# Patient Record
Sex: Female | Born: 1935
Health system: Southern US, Community
[De-identification: ages and names within clinical notes are randomized; demographics above are authoritative.]

## PROBLEM LIST (undated history)

## (undated) DIAGNOSIS — I351 Nonrheumatic aortic (valve) insufficiency: Secondary | ICD-10-CM

## (undated) DIAGNOSIS — I4891 Unspecified atrial fibrillation: Secondary | ICD-10-CM

## (undated) DIAGNOSIS — M1990 Unspecified osteoarthritis, unspecified site: Secondary | ICD-10-CM

## (undated) DIAGNOSIS — I428 Other cardiomyopathies: Secondary | ICD-10-CM

## (undated) DIAGNOSIS — E039 Hypothyroidism, unspecified: Secondary | ICD-10-CM

## (undated) DIAGNOSIS — I491 Atrial premature depolarization: Secondary | ICD-10-CM

## (undated) DIAGNOSIS — I2510 Atherosclerotic heart disease of native coronary artery without angina pectoris: Secondary | ICD-10-CM

## (undated) DIAGNOSIS — I214 Non-ST elevation (NSTEMI) myocardial infarction: Secondary | ICD-10-CM

## (undated) DIAGNOSIS — I420 Dilated cardiomyopathy: Secondary | ICD-10-CM

## (undated) DIAGNOSIS — E049 Nontoxic goiter, unspecified: Secondary | ICD-10-CM

## (undated) DIAGNOSIS — D62 Acute posthemorrhagic anemia: Secondary | ICD-10-CM

## (undated) HISTORY — DX: Nonrheumatic aortic (valve) insufficiency: I35.1

## (undated) HISTORY — PX: CARDIAC CATHETERIZATION: SHX172

## (undated) HISTORY — DX: Other cardiomyopathies: I42.8

## (undated) HISTORY — DX: Atrial premature depolarization: I49.1

## (undated) HISTORY — DX: Acute posthemorrhagic anemia: D62

## (undated) HISTORY — DX: Dilated cardiomyopathy: I42.0

## (undated) HISTORY — DX: Hypothyroidism, unspecified: E03.9

## (undated) HISTORY — PX: VAGINAL HYSTERECTOMY: SUR661

---

## 1997-04-02 DIAGNOSIS — I428 Other cardiomyopathies: Secondary | ICD-10-CM

## 1997-04-02 DIAGNOSIS — I429 Cardiomyopathy, unspecified: Secondary | ICD-10-CM

## 1997-04-02 HISTORY — DX: Other cardiomyopathies: I42.8

## 1997-04-02 HISTORY — DX: Cardiomyopathy, unspecified: I42.9

## 1997-08-05 ENCOUNTER — Inpatient Hospital Stay (HOSPITAL_COMMUNITY): Admission: EM | Admit: 1997-08-05 | Discharge: 1997-08-09 | Payer: Self-pay | Admitting: Emergency Medicine

## 1998-10-03 ENCOUNTER — Ambulatory Visit (HOSPITAL_COMMUNITY): Admission: RE | Admit: 1998-10-03 | Discharge: 1998-10-03 | Payer: Self-pay | Admitting: Internal Medicine

## 1999-11-01 ENCOUNTER — Ambulatory Visit (HOSPITAL_COMMUNITY): Admission: RE | Admit: 1999-11-01 | Discharge: 1999-11-01 | Payer: Self-pay | Admitting: Internal Medicine

## 1999-11-01 ENCOUNTER — Encounter: Payer: Self-pay | Admitting: Internal Medicine

## 1999-12-25 ENCOUNTER — Other Ambulatory Visit: Admission: RE | Admit: 1999-12-25 | Discharge: 1999-12-25 | Payer: Self-pay | Admitting: Internal Medicine

## 2001-04-29 ENCOUNTER — Emergency Department (HOSPITAL_COMMUNITY): Admission: EM | Admit: 2001-04-29 | Discharge: 2001-04-29 | Payer: Self-pay | Admitting: *Deleted

## 2001-11-15 ENCOUNTER — Emergency Department (HOSPITAL_COMMUNITY): Admission: EM | Admit: 2001-11-15 | Discharge: 2001-11-15 | Payer: Self-pay | Admitting: Emergency Medicine

## 2001-11-15 ENCOUNTER — Encounter: Payer: Self-pay | Admitting: Emergency Medicine

## 2004-10-26 ENCOUNTER — Encounter: Admission: RE | Admit: 2004-10-26 | Discharge: 2004-10-26 | Payer: Self-pay | Admitting: Internal Medicine

## 2004-11-15 ENCOUNTER — Encounter (INDEPENDENT_AMBULATORY_CARE_PROVIDER_SITE_OTHER): Payer: Self-pay | Admitting: Specialist

## 2004-11-15 ENCOUNTER — Encounter: Admission: RE | Admit: 2004-11-15 | Discharge: 2004-11-15 | Payer: Self-pay | Admitting: Internal Medicine

## 2004-11-15 ENCOUNTER — Other Ambulatory Visit: Admission: RE | Admit: 2004-11-15 | Discharge: 2004-11-15 | Payer: Self-pay | Admitting: Interventional Radiology

## 2006-03-11 ENCOUNTER — Emergency Department (HOSPITAL_COMMUNITY): Admission: EM | Admit: 2006-03-11 | Discharge: 2006-03-11 | Payer: Self-pay | Admitting: Emergency Medicine

## 2006-04-02 HISTORY — PX: THYROIDECTOMY: SHX17

## 2007-01-23 ENCOUNTER — Encounter: Admission: RE | Admit: 2007-01-23 | Discharge: 2007-01-23 | Payer: Self-pay | Admitting: Internal Medicine

## 2008-04-02 HISTORY — PX: OTHER SURGICAL HISTORY: SHX169

## 2008-12-22 ENCOUNTER — Encounter (HOSPITAL_COMMUNITY): Admission: RE | Admit: 2008-12-22 | Discharge: 2009-03-04 | Payer: Self-pay | Admitting: Neurosurgery

## 2009-01-18 ENCOUNTER — Encounter (INDEPENDENT_AMBULATORY_CARE_PROVIDER_SITE_OTHER): Payer: Self-pay | Admitting: Interventional Radiology

## 2009-01-18 ENCOUNTER — Ambulatory Visit (HOSPITAL_COMMUNITY): Admission: RE | Admit: 2009-01-18 | Discharge: 2009-01-18 | Payer: Self-pay | Admitting: Neurosurgery

## 2009-04-25 ENCOUNTER — Ambulatory Visit (HOSPITAL_COMMUNITY): Admission: RE | Admit: 2009-04-25 | Discharge: 2009-04-25 | Payer: Self-pay | Admitting: Internal Medicine

## 2010-04-02 DIAGNOSIS — R079 Chest pain, unspecified: Secondary | ICD-10-CM | POA: Insufficient documentation

## 2010-04-12 ENCOUNTER — Ambulatory Visit: Payer: Self-pay | Admitting: Cardiovascular Disease

## 2010-04-13 ENCOUNTER — Encounter: Payer: Self-pay | Admitting: Cardiology

## 2010-04-13 ENCOUNTER — Ambulatory Visit: Admission: RE | Admit: 2010-04-13 | Discharge: 2010-04-13 | Payer: Self-pay | Source: Home / Self Care

## 2010-04-13 ENCOUNTER — Ambulatory Visit (HOSPITAL_COMMUNITY)
Admission: RE | Admit: 2010-04-13 | Discharge: 2010-04-13 | Payer: Self-pay | Source: Home / Self Care | Attending: Cardiovascular Disease | Admitting: Cardiovascular Disease

## 2010-04-13 ENCOUNTER — Encounter: Payer: Self-pay | Admitting: Cardiovascular Disease

## 2010-05-16 ENCOUNTER — Ambulatory Visit: Payer: Self-pay | Admitting: Cardiovascular Disease

## 2010-05-16 ENCOUNTER — Ambulatory Visit (INDEPENDENT_AMBULATORY_CARE_PROVIDER_SITE_OTHER): Payer: Medicare Other | Admitting: *Deleted

## 2010-05-16 DIAGNOSIS — I359 Nonrheumatic aortic valve disorder, unspecified: Secondary | ICD-10-CM

## 2010-05-16 DIAGNOSIS — R079 Chest pain, unspecified: Secondary | ICD-10-CM

## 2010-05-17 ENCOUNTER — Other Ambulatory Visit (HOSPITAL_COMMUNITY): Payer: Self-pay | Admitting: *Deleted

## 2010-05-17 DIAGNOSIS — Z1231 Encounter for screening mammogram for malignant neoplasm of breast: Secondary | ICD-10-CM

## 2010-05-22 ENCOUNTER — Encounter (HOSPITAL_COMMUNITY): Payer: Self-pay

## 2010-05-22 ENCOUNTER — Ambulatory Visit (HOSPITAL_COMMUNITY)
Admission: RE | Admit: 2010-05-22 | Discharge: 2010-05-22 | Disposition: A | Discharge status: Home / Self Care | Payer: Medicare Other | Source: Ambulatory Visit | Attending: *Deleted | Admitting: *Deleted

## 2010-05-22 DIAGNOSIS — Z1231 Encounter for screening mammogram for malignant neoplasm of breast: Secondary | ICD-10-CM | POA: Insufficient documentation

## 2010-07-06 LAB — CBC
Hemoglobin: 12.1 g/dL (ref 12.0–15.0)
MCHC: 34.8 g/dL (ref 30.0–36.0)
RDW: 13.8 % (ref 11.5–15.5)
WBC: 5 10*3/uL (ref 4.0–10.5)

## 2010-07-06 LAB — PROTIME-INR: INR: 1.06 (ref 0.00–1.49)

## 2010-12-05 ENCOUNTER — Encounter: Payer: Self-pay | Admitting: Cardiology

## 2010-12-15 ENCOUNTER — Ambulatory Visit (INDEPENDENT_AMBULATORY_CARE_PROVIDER_SITE_OTHER): Payer: Medicare Other | Admitting: Cardiology

## 2010-12-15 ENCOUNTER — Encounter: Payer: Self-pay | Admitting: Cardiology

## 2010-12-15 VITALS — BP 122/70 | HR 68 | Ht 64.0 in | Wt 138.4 lb

## 2010-12-15 DIAGNOSIS — I351 Nonrheumatic aortic (valve) insufficiency: Secondary | ICD-10-CM | POA: Insufficient documentation

## 2010-12-15 DIAGNOSIS — I420 Dilated cardiomyopathy: Secondary | ICD-10-CM | POA: Insufficient documentation

## 2010-12-15 DIAGNOSIS — I491 Atrial premature depolarization: Secondary | ICD-10-CM

## 2010-12-15 DIAGNOSIS — I428 Other cardiomyopathies: Secondary | ICD-10-CM

## 2010-12-15 DIAGNOSIS — R079 Chest pain, unspecified: Secondary | ICD-10-CM

## 2010-12-15 DIAGNOSIS — I359 Nonrheumatic aortic valve disorder, unspecified: Secondary | ICD-10-CM

## 2010-12-15 NOTE — Patient Instructions (Signed)
Continue your current therapy.  I will see you again in 1 year.

## 2010-12-15 NOTE — Assessment & Plan Note (Signed)
Most recent echocardiogram showed ejection fraction 50-55%. No evidence of congestive heart failure.

## 2010-12-15 NOTE — Assessment & Plan Note (Signed)
She has mild to moderate aortic insufficiency. This is unlikely to ever cause her any difficulty.

## 2010-12-15 NOTE — Progress Notes (Signed)
   Julie Lara Date of Birth: Sep 11, 1920   History of Present Illness: Julie Lara is seen for yearly followup. She is a pleasant 77 year old white female. She has a prior history of atypical left arm pain. She has had a normal echocardiogram. She has a diagnosis of dilated cardiomyopathy in the past but her most recent echocardiogram in January showed an ejection fraction of 50-55%. She does have mild to moderate aortic insufficiency and chronic PACs. On followup today she is feeling very well. She denies any symptoms of chest pain, arm pain, or dyspnea. She has had no significant palpitations or dizziness.  Current Outpatient Prescriptions on File Prior to Visit  Medication Sig Dispense Refill  . aspirin 81 MG tablet Take 81 mg by mouth daily.        . Cholecalciferol (VITAMIN D PO) Take by mouth.        . levothyroxine (SYNTHROID, LEVOTHROID) 100 MCG tablet Take 100 mcg by mouth daily.        . Multiple Vitamin (MULTI-VITAMIN PO) Take by mouth.        Marland Kitchen VITAMIN E PO Take by mouth.          No Known Allergies  Past Medical History  Diagnosis Date  . 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 History of thyroidectomy   . Dilated cardiomyopathy   . Aortic insufficiency     mild to moderate  . PAC (premature atrial contraction)     Chronic  . Hypothyroidism   . Idiopathic cardiomyopathy 1999    Hx with last EF in 2012 was 50-55%    Past Surgical History  Procedure Date  . Cardiac catheterization   . Thyroidectomy   . Total vaginal hysterectomy     Complete  . US echocardiography 03-12-2006    Est EF 55-60%    History  Smoking status  . Never Smoker   Smokeless tobacco  . Not on file    History  Alcohol Use     History reviewed. No pertinent family history.  Review of Systems: The review of systems is positive for a chronic goiter. She is on thyroid replacement. All other systems were reviewed and are negative.  Physical Exam: BP 122/70   Pulse 68  Ht 5\' 4"  (1.626 m)  Wt 138 lb 6.4 oz (62.778 kg)  BMI 23.76 kg/m2 She is a pleasant elderly white female in no acute distress.The patient is alert and oriented x 3.  The mood and affect are normal.  The skin is warm and dry.  Color is normal.  The HEENT exam reveals that the sclera are nonicteric.  The mucous membranes are moist.  The carotids are 2+ without bruits.  There is no thyromegaly.  There is no JVD.  The lungs are clear.  The chest wall is non tender.  The heart exam reveals a regular rate with frequent extrasystoles.  There are no murmurs, gallops, or rubs.  The PMI is not displaced.   Abdominal exam reveals good bowel sounds.  There is no guarding or rebound.  There is no hepatosplenomegaly or tenderness.  There are no masses.  Exam of the legs reveal no clubbing, cyanosis, or edema.  The legs are without rashes.  The distal pulses are intact.  Cranial nerves II - XII are intact.  Motor and sensory functions are intact.  The gait is normal.  LABORATORY DATA:   Assessment / Plan:

## 2010-12-15 NOTE — Assessment & Plan Note (Signed)
Frequent extrasystoles noted on exam. She is asymptomatic. I recommended avoidance of caffeine.

## 2010-12-22 ENCOUNTER — Emergency Department (HOSPITAL_COMMUNITY)
Admission: EM | Admit: 2010-12-22 | Discharge: 2010-12-22 | Disposition: A | Discharge status: Home / Self Care | Payer: Medicare Other | Attending: Emergency Medicine | Admitting: Emergency Medicine

## 2010-12-22 ENCOUNTER — Emergency Department (HOSPITAL_COMMUNITY): Payer: Medicare Other

## 2010-12-22 DIAGNOSIS — E039 Hypothyroidism, unspecified: Secondary | ICD-10-CM | POA: Insufficient documentation

## 2010-12-22 DIAGNOSIS — Z8542 Personal history of malignant neoplasm of other parts of uterus: Secondary | ICD-10-CM | POA: Insufficient documentation

## 2010-12-22 DIAGNOSIS — M542 Cervicalgia: Secondary | ICD-10-CM | POA: Insufficient documentation

## 2010-12-22 DIAGNOSIS — Z79899 Other long term (current) drug therapy: Secondary | ICD-10-CM | POA: Insufficient documentation

## 2011-04-13 DIAGNOSIS — Z23 Encounter for immunization: Secondary | ICD-10-CM | POA: Diagnosis not present

## 2011-04-16 DIAGNOSIS — H02059 Trichiasis without entropian unspecified eye, unspecified eyelid: Secondary | ICD-10-CM | POA: Diagnosis not present

## 2011-04-16 DIAGNOSIS — H524 Presbyopia: Secondary | ICD-10-CM | POA: Diagnosis not present

## 2011-04-16 DIAGNOSIS — Z961 Presence of intraocular lens: Secondary | ICD-10-CM | POA: Diagnosis not present

## 2011-05-24 ENCOUNTER — Other Ambulatory Visit (HOSPITAL_COMMUNITY): Payer: Self-pay | Admitting: Internal Medicine

## 2011-05-24 DIAGNOSIS — Z1231 Encounter for screening mammogram for malignant neoplasm of breast: Secondary | ICD-10-CM

## 2011-05-29 ENCOUNTER — Ambulatory Visit (HOSPITAL_COMMUNITY): Payer: Medicare Other

## 2011-06-19 ENCOUNTER — Ambulatory Visit (HOSPITAL_COMMUNITY)
Admission: RE | Admit: 2011-06-19 | Discharge: 2011-06-19 | Disposition: A | Discharge status: Home / Self Care | Payer: Medicare Other | Source: Ambulatory Visit | Attending: Internal Medicine | Admitting: Internal Medicine

## 2011-06-19 DIAGNOSIS — Z1231 Encounter for screening mammogram for malignant neoplasm of breast: Secondary | ICD-10-CM | POA: Diagnosis not present

## 2011-12-19 ENCOUNTER — Ambulatory Visit (INDEPENDENT_AMBULATORY_CARE_PROVIDER_SITE_OTHER): Payer: Medicare Other | Admitting: Cardiology

## 2011-12-19 ENCOUNTER — Encounter: Payer: Self-pay | Admitting: Cardiology

## 2011-12-19 VITALS — BP 128/80 | HR 80 | Ht 64.0 in | Wt 138.0 lb

## 2011-12-19 DIAGNOSIS — I359 Nonrheumatic aortic valve disorder, unspecified: Secondary | ICD-10-CM

## 2011-12-19 DIAGNOSIS — I491 Atrial premature depolarization: Secondary | ICD-10-CM | POA: Diagnosis not present

## 2011-12-19 DIAGNOSIS — I428 Other cardiomyopathies: Secondary | ICD-10-CM | POA: Diagnosis not present

## 2011-12-19 DIAGNOSIS — I351 Nonrheumatic aortic (valve) insufficiency: Secondary | ICD-10-CM

## 2011-12-19 DIAGNOSIS — I420 Dilated cardiomyopathy: Secondary | ICD-10-CM

## 2011-12-19 NOTE — Patient Instructions (Signed)
Continue your current therapy  I will see you again in one year.   

## 2011-12-19 NOTE — Progress Notes (Signed)
[name] Date of Birth: Dec 02, 1920   History of Present Illness: Julie Lara is seen for yearly followup. She has a diagnosis of dilated cardiomyopathy in the past but her most recent echocardiogram in January 2012 showed an ejection fraction of 50-55%. She does have mild to moderate aortic insufficiency and chronic PACs. On followup today she is feeling very well. She denies any palpitations, dyspnea, or chest pain. She does have some chronic back pain. She's had no new medical problems this past year.  Current Outpatient Prescriptions on File Prior to Visit  Medication Sig Dispense Refill  . aspirin 81 MG tablet Take 81 mg by mouth daily. Takes occ      . Cholecalciferol (VITAMIN D PO) Take by mouth.        . levothyroxine (SYNTHROID, LEVOTHROID) 100 MCG tablet Take 100 mcg by mouth daily.        . Multiple Vitamin (MULTI-VITAMIN PO) Take by mouth.          No Known Allergies  Past Medical History  Diagnosis Date  . 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 History of thyroidectomy   . Dilated cardiomyopathy   . Aortic insufficiency     mild to moderate  . PAC (premature atrial contraction)     Chronic  . Hypothyroidism   . Idiopathic cardiomyopathy 1999    Hx with last EF in 2012 was 50-55%    Past Surgical History  Procedure Date  . Cardiac catheterization   . Thyroidectomy   . Total vaginal hysterectomy     Complete  . US echocardiography 03-12-2006    Est EF 55-60%    History  Smoking status  . Never Smoker   Smokeless tobacco  . Not on file    History  Alcohol Use     History reviewed. No pertinent family history.  Review of Systems: The review of systems is positive for a chronic goiter. She is on thyroid replacement. All other systems were reviewed and are negative.  Physical Exam: BP 128/80  Pulse 80  Ht 5\' 4"  (1.626 m)  Wt 138 lb (62.596 kg)  BMI 23.69 kg/m2 She is a pleasant elderly white female in no acute  distress.The patient is alert and oriented x 3.  The mood and affect are normal.  The skin is warm and dry.  Color is normal.  The HEENT exam reveals that the sclera are nonicteric.  The mucous membranes are moist.  The carotids are 2+ without bruits.  There is enlargement of the thyroid with multiple nodules. There is no JVD.  The lungs are clear.  The chest wall is non tender.  The heart exam reveals a regular rate with frequent extrasystoles.  There are no murmurs, gallops, or rubs.  The PMI is not displaced.   Abdominal exam reveals good bowel sounds.  There is no guarding or rebound.  There is no hepatosplenomegaly or tenderness.  There are no masses.  Exam of the legs reveal no clubbing, cyanosis, or edema.  The legs are without rashes.  The distal pulses are intact.  Cranial nerves II - XII are intact.  Motor and sensory functions are intact.  The gait is normal.  LABORATORY DATA: ECG demonstrates normal sinus rhythm with frequent PACs. There is LVH by voltage. She has T-wave inversion in the inferior lateral lead was consistent with ischemia.  Assessment / Plan: 1. Dilated cardiomyopathy. Most recent ejection fraction in January 2012 showed an  ejection fraction of 50-55%. She is asymptomatic. We will continue her current therapy and followup again in one year.  2. PACs, asymptomatic.  3. Mild to moderate aortic insufficiency.

## 2012-01-14 ENCOUNTER — Other Ambulatory Visit: Payer: Self-pay | Admitting: Internal Medicine

## 2012-01-14 DIAGNOSIS — E038 Other specified hypothyroidism: Secondary | ICD-10-CM | POA: Diagnosis not present

## 2012-01-14 DIAGNOSIS — R1310 Dysphagia, unspecified: Secondary | ICD-10-CM

## 2012-01-14 DIAGNOSIS — I10 Essential (primary) hypertension: Secondary | ICD-10-CM | POA: Diagnosis not present

## 2012-01-14 DIAGNOSIS — Z1331 Encounter for screening for depression: Secondary | ICD-10-CM | POA: Diagnosis not present

## 2012-01-15 ENCOUNTER — Ambulatory Visit
Admission: RE | Admit: 2012-01-15 | Discharge: 2012-01-15 | Disposition: A | Discharge status: Home / Self Care | Payer: Medicare Other | Source: Ambulatory Visit | Attending: Internal Medicine | Admitting: Internal Medicine

## 2012-01-15 DIAGNOSIS — R1310 Dysphagia, unspecified: Secondary | ICD-10-CM

## 2012-03-04 DIAGNOSIS — L259 Unspecified contact dermatitis, unspecified cause: Secondary | ICD-10-CM | POA: Diagnosis not present

## 2012-04-18 DIAGNOSIS — Z961 Presence of intraocular lens: Secondary | ICD-10-CM | POA: Diagnosis not present

## 2012-04-18 DIAGNOSIS — H52209 Unspecified astigmatism, unspecified eye: Secondary | ICD-10-CM | POA: Diagnosis not present

## 2012-05-20 ENCOUNTER — Other Ambulatory Visit: Payer: Self-pay | Admitting: Dermatology

## 2012-05-20 DIAGNOSIS — L259 Unspecified contact dermatitis, unspecified cause: Secondary | ICD-10-CM | POA: Diagnosis not present

## 2012-05-20 DIAGNOSIS — L570 Actinic keratosis: Secondary | ICD-10-CM | POA: Diagnosis not present

## 2012-05-20 DIAGNOSIS — C44319 Basal cell carcinoma of skin of other parts of face: Secondary | ICD-10-CM | POA: Diagnosis not present

## 2012-06-30 ENCOUNTER — Ambulatory Visit (INDEPENDENT_AMBULATORY_CARE_PROVIDER_SITE_OTHER): Payer: Medicare Other | Admitting: Vascular Surgery

## 2012-06-30 DIAGNOSIS — L03119 Cellulitis of unspecified part of limb: Secondary | ICD-10-CM | POA: Diagnosis not present

## 2012-06-30 DIAGNOSIS — M7989 Other specified soft tissue disorders: Secondary | ICD-10-CM | POA: Diagnosis not present

## 2012-06-30 DIAGNOSIS — L02419 Cutaneous abscess of limb, unspecified: Secondary | ICD-10-CM | POA: Diagnosis not present

## 2012-06-30 NOTE — Progress Notes (Signed)
Preliminary results left lower extremity venous duplex phoned to [place] and given to [facility] @ 13:42.

## 2012-07-03 ENCOUNTER — Other Ambulatory Visit: Payer: Self-pay | Admitting: Dermatology

## 2012-07-03 DIAGNOSIS — C44319 Basal cell carcinoma of skin of other parts of face: Secondary | ICD-10-CM | POA: Diagnosis not present

## 2012-07-07 DIAGNOSIS — E038 Other specified hypothyroidism: Secondary | ICD-10-CM | POA: Diagnosis not present

## 2012-07-07 DIAGNOSIS — E559 Vitamin D deficiency, unspecified: Secondary | ICD-10-CM | POA: Diagnosis not present

## 2012-07-07 DIAGNOSIS — I10 Essential (primary) hypertension: Secondary | ICD-10-CM | POA: Diagnosis not present

## 2012-07-14 DIAGNOSIS — M5137 Other intervertebral disc degeneration, lumbosacral region: Secondary | ICD-10-CM | POA: Diagnosis not present

## 2012-07-14 DIAGNOSIS — I10 Essential (primary) hypertension: Secondary | ICD-10-CM | POA: Diagnosis not present

## 2012-07-14 DIAGNOSIS — E038 Other specified hypothyroidism: Secondary | ICD-10-CM | POA: Diagnosis not present

## 2012-07-14 DIAGNOSIS — Z0000 Encounter for general adult medical examination without abnormal findings: Secondary | ICD-10-CM | POA: Diagnosis not present

## 2012-07-14 DIAGNOSIS — J309 Allergic rhinitis, unspecified: Secondary | ICD-10-CM | POA: Diagnosis not present

## 2012-07-14 DIAGNOSIS — R82998 Other abnormal findings in urine: Secondary | ICD-10-CM | POA: Diagnosis not present

## 2012-07-14 DIAGNOSIS — Z6825 Body mass index (BMI) 25.0-25.9, adult: Secondary | ICD-10-CM | POA: Diagnosis not present

## 2012-07-14 DIAGNOSIS — E559 Vitamin D deficiency, unspecified: Secondary | ICD-10-CM | POA: Diagnosis not present

## 2012-07-22 DIAGNOSIS — R609 Edema, unspecified: Secondary | ICD-10-CM | POA: Diagnosis not present

## 2012-07-22 DIAGNOSIS — M779 Enthesopathy, unspecified: Secondary | ICD-10-CM | POA: Diagnosis not present

## 2012-07-22 DIAGNOSIS — B351 Tinea unguium: Secondary | ICD-10-CM | POA: Diagnosis not present

## 2012-07-22 DIAGNOSIS — M19079 Primary osteoarthritis, unspecified ankle and foot: Secondary | ICD-10-CM | POA: Diagnosis not present

## 2012-07-30 ENCOUNTER — Other Ambulatory Visit (HOSPITAL_COMMUNITY): Payer: Self-pay | Admitting: Internal Medicine

## 2012-07-30 DIAGNOSIS — Z1231 Encounter for screening mammogram for malignant neoplasm of breast: Secondary | ICD-10-CM

## 2012-08-07 ENCOUNTER — Ambulatory Visit (HOSPITAL_COMMUNITY)
Admission: RE | Admit: 2012-08-07 | Discharge: 2012-08-07 | Disposition: A | Discharge status: Home / Self Care | Payer: Medicare Other | Source: Ambulatory Visit | Attending: Internal Medicine | Admitting: Internal Medicine

## 2012-08-07 DIAGNOSIS — Z1231 Encounter for screening mammogram for malignant neoplasm of breast: Secondary | ICD-10-CM | POA: Diagnosis not present

## 2012-09-30 DIAGNOSIS — I214 Non-ST elevation (NSTEMI) myocardial infarction: Secondary | ICD-10-CM

## 2012-09-30 HISTORY — DX: Non-ST elevation (NSTEMI) myocardial infarction: I21.4

## 2012-09-30 HISTORY — PX: CORONARY ANGIOPLASTY WITH STENT PLACEMENT: SHX49

## 2012-10-08 ENCOUNTER — Other Ambulatory Visit: Payer: Self-pay | Admitting: Dermatology

## 2012-10-08 DIAGNOSIS — C44319 Basal cell carcinoma of skin of other parts of face: Secondary | ICD-10-CM | POA: Diagnosis not present

## 2012-10-12 ENCOUNTER — Inpatient Hospital Stay (HOSPITAL_COMMUNITY)
Admission: EM | Admit: 2012-10-12 | Discharge: 2012-10-15 | DRG: 249 | Disposition: A | Discharge status: Home / Self Care | Payer: Medicare Other | Attending: Cardiology | Admitting: Cardiology

## 2012-10-12 ENCOUNTER — Emergency Department (HOSPITAL_COMMUNITY): Payer: Medicare Other

## 2012-10-12 ENCOUNTER — Emergency Department (INDEPENDENT_AMBULATORY_CARE_PROVIDER_SITE_OTHER)
Admission: EM | Admit: 2012-10-12 | Discharge: 2012-10-12 | Disposition: A | Discharge status: Nursing Home (Includes ICF) | Payer: Medicare Other | Source: Home / Self Care

## 2012-10-12 ENCOUNTER — Encounter (HOSPITAL_COMMUNITY): Payer: Self-pay | Admitting: Emergency Medicine

## 2012-10-12 ENCOUNTER — Encounter (HOSPITAL_COMMUNITY): Payer: Self-pay | Admitting: *Deleted

## 2012-10-12 DIAGNOSIS — E890 Postprocedural hypothyroidism: Secondary | ICD-10-CM | POA: Diagnosis not present

## 2012-10-12 DIAGNOSIS — E039 Hypothyroidism, unspecified: Secondary | ICD-10-CM

## 2012-10-12 DIAGNOSIS — I2510 Atherosclerotic heart disease of native coronary artery without angina pectoris: Secondary | ICD-10-CM | POA: Diagnosis present

## 2012-10-12 DIAGNOSIS — I428 Other cardiomyopathies: Secondary | ICD-10-CM | POA: Diagnosis not present

## 2012-10-12 DIAGNOSIS — R079 Chest pain, unspecified: Secondary | ICD-10-CM

## 2012-10-12 DIAGNOSIS — I214 Non-ST elevation (NSTEMI) myocardial infarction: Secondary | ICD-10-CM | POA: Diagnosis not present

## 2012-10-12 DIAGNOSIS — I4891 Unspecified atrial fibrillation: Secondary | ICD-10-CM | POA: Diagnosis not present

## 2012-10-12 DIAGNOSIS — R0602 Shortness of breath: Secondary | ICD-10-CM | POA: Diagnosis not present

## 2012-10-12 DIAGNOSIS — Z955 Presence of coronary angioplasty implant and graft: Secondary | ICD-10-CM

## 2012-10-12 DIAGNOSIS — N183 Chronic kidney disease, stage 3 unspecified: Secondary | ICD-10-CM

## 2012-10-12 DIAGNOSIS — I359 Nonrheumatic aortic valve disorder, unspecified: Secondary | ICD-10-CM | POA: Diagnosis not present

## 2012-10-12 DIAGNOSIS — I491 Atrial premature depolarization: Secondary | ICD-10-CM | POA: Diagnosis present

## 2012-10-12 DIAGNOSIS — I351 Nonrheumatic aortic (valve) insufficiency: Secondary | ICD-10-CM | POA: Diagnosis present

## 2012-10-12 DIAGNOSIS — I420 Dilated cardiomyopathy: Secondary | ICD-10-CM | POA: Diagnosis present

## 2012-10-12 DIAGNOSIS — N184 Chronic kidney disease, stage 4 (severe): Secondary | ICD-10-CM | POA: Diagnosis not present

## 2012-10-12 DIAGNOSIS — R5381 Other malaise: Secondary | ICD-10-CM | POA: Diagnosis not present

## 2012-10-12 DIAGNOSIS — N390 Urinary tract infection, site not specified: Secondary | ICD-10-CM | POA: Diagnosis not present

## 2012-10-12 DIAGNOSIS — R5383 Other fatigue: Secondary | ICD-10-CM

## 2012-10-12 HISTORY — DX: Atherosclerotic heart disease of native coronary artery without angina pectoris: I25.10

## 2012-10-12 HISTORY — DX: Nontoxic goiter, unspecified: E04.9

## 2012-10-12 LAB — COMPREHENSIVE METABOLIC PANEL
ALT: 10 U/L (ref 0–35)
AST: 15 U/L (ref 0–37)
Albumin: 3.6 g/dL (ref 3.5–5.2)
Alkaline Phosphatase: 85 U/L (ref 39–117)
Calcium: 9.7 mg/dL (ref 8.4–10.5)
Glucose, Bld: 103 mg/dL — ABNORMAL HIGH (ref 70–99)
Potassium: 5.1 mEq/L (ref 3.5–5.1)
Sodium: 136 mEq/L (ref 135–145)
Total Protein: 7.1 g/dL (ref 6.0–8.3)

## 2012-10-12 LAB — CBC
HCT: 38.1 % (ref 36.0–46.0)
Hemoglobin: 13.1 g/dL (ref 12.0–15.0)
MCH: 30.4 pg (ref 26.0–34.0)
MCHC: 34.4 g/dL (ref 30.0–36.0)
MCV: 88.4 fL (ref 78.0–100.0)
Platelets: 190 10*3/uL (ref 150–400)
RBC: 4.31 MIL/uL (ref 3.87–5.11)
RDW: 14.1 % (ref 11.5–15.5)
WBC: 6.3 10*3/uL (ref 4.0–10.5)

## 2012-10-12 LAB — TROPONIN I
Troponin I: <0.3 ng/mL (ref <0.30)
Troponin I: 0.32 ng/mL (ref <0.30)

## 2012-10-12 LAB — MAGNESIUM: Magnesium: 2.3 mg/dL (ref 1.5–2.5)

## 2012-10-12 LAB — PRO B NATRIURETIC PEPTIDE: Pro B Natriuretic peptide (BNP): 6020 pg/mL — ABNORMAL HIGH (ref 0–450)

## 2012-10-12 MED ORDER — ACETAMINOPHEN 325 MG PO TABS: 650.0000 mg | ORAL_TABLET | ORAL | Status: DC | PRN

## 2012-10-12 MED ORDER — DILTIAZEM HCL 25 MG/5ML IV SOLN
10.0000 mg | Freq: Once | INTRAVENOUS | Status: AC
  Administered 2012-10-12: 10 mg via INTRAVENOUS
  Filled 2012-10-12: qty 5

## 2012-10-12 MED ORDER — LEVOTHYROXINE SODIUM 100 MCG PO TABS
100.0000 ug | ORAL_TABLET | Freq: Every day | ORAL | Status: DC
  Administered 2012-10-13: 100 ug via ORAL
  Filled 2012-10-12 (×2): qty 1

## 2012-10-12 MED ORDER — SODIUM CHLORIDE 0.9 % IV SOLN: Freq: Once | INTRAVENOUS | Status: DC

## 2012-10-12 MED ORDER — SODIUM CHLORIDE 0.9 % IV SOLN
1000.0000 mL | Freq: Once | INTRAVENOUS | Status: AC
  Administered 2012-10-12: 1000 mL via INTRAVENOUS

## 2012-10-12 MED ORDER — SODIUM CHLORIDE 0.9 % IV BOLUS (SEPSIS): 500.0000 mL | Freq: Once | INTRAVENOUS | Status: DC

## 2012-10-12 MED ORDER — DILTIAZEM HCL 100 MG IV SOLR
5.0000 mg/h | Freq: Once | INTRAVENOUS | Status: AC
  Administered 2012-10-12: 5 mg/h via INTRAVENOUS

## 2012-10-12 MED ORDER — SODIUM CHLORIDE 0.9 % IV SOLN
1000.0000 mL | INTRAVENOUS | Status: DC
  Administered 2012-10-12 – 2012-10-13 (×2): 1000 mL via INTRAVENOUS

## 2012-10-12 MED ORDER — DILTIAZEM HCL 100 MG IV SOLR
5.0000 mg/h | INTRAVENOUS | Status: DC
  Administered 2012-10-12: 5 mg/h via INTRAVENOUS
  Filled 2012-10-12 (×2): qty 100

## 2012-10-12 MED ORDER — APIXABAN 2.5 MG PO TABS
2.5000 mg | ORAL_TABLET | Freq: Two times a day (BID) | ORAL | Status: DC
  Administered 2012-10-12 – 2012-10-13 (×3): 2.5 mg via ORAL
  Filled 2012-10-12 (×5): qty 1

## 2012-10-12 NOTE — ED Notes (Signed)
Diet tray ordered 

## 2012-10-12 NOTE — ED Provider Notes (Addendum)
Medical screening examination/treatment/procedure(s) were conducted as a shared visit with non-physician practitioner(s) and myself.  I personally evaluated the patient during the encounter   New-onset atrial fibrillation with rapid ventricular response.  Symptoms started approximately 48 hours ago.  Now rate controlled on a Cardizem drip.  No prior history of coronary artery disease.  Will admit to cardiology.   Date: 10/12/2012  Rate: 110  Rhythm: afib with RVR QRS Axis: normal  Intervals: normal  ST/T Wave abnormalities: normal  Conduction Disutrbances: none  Narrative Interpretation:   Old EKG Reviewed: afib is new     Hoy Morn, MD 10/12/12 [place], MD 10/12/12 1806

## 2012-10-12 NOTE — ED Notes (Signed)
Report given to Sam, [facility].

## 2012-10-12 NOTE — ED Notes (Signed)
[facility] notified of pt transport.  Report called to Lilia Pro, ED Charge RN.

## 2012-10-12 NOTE — ED Notes (Signed)
Patient placed on Cardiac Monitor and supplemental O2 ([name]); lead II rate 98 - 135 bpm (VFib), spO2 96% on 2Lpm

## 2012-10-12 NOTE — ED Provider Notes (Signed)
History    CSN: [id number] Arrival date & time 10/12/12  1434  First MD Initiated Contact with Patient 10/12/12 1445     Chief Complaint  Patient presents with  . Weakness   (Consider location/radiation/quality/duration/timing/severity/associated sxs/prior Treatment) HPI Pt is a 79yo female with hx of idiopathic cardiomyopathy, PAC and thyroidectomy sent to ED today from urgent care where pt was being seen for 2 day hx of increased weakness. Pt was found to have afib with rate 98-135, upon arrival to ED pt was in afib with 90-105.  Pt states she has felt "plumb lazy" the past 2 days, generalized weakness.  Pt also reports hx of bilateral shoulder pain Friday, 7/11 that was sore and achy, "felt like I slept wrong."  Pt reports hx of "skipped beats" she feels on occasion, but denies hx of afib.  Pt does not have cardiologist at this time but has been placed on BP medication several years ago, but stopped because it made pt feel bad.  Denies chest pain, SOB, abdominal pain, back pain, diaphoresis, fever, n/v/d.   Past Medical History  Diagnosis Date  . 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 History of thyroidectomy   . Dilated cardiomyopathy   . Aortic insufficiency     mild to moderate  . PAC (premature atrial contraction)     Chronic  . Hypothyroidism   . Idiopathic cardiomyopathy 1999    Hx with last EF in 2012 was 50-55%  . Goiter    Past Surgical History  Procedure Laterality Date  . Cardiac catheterization    . Thyroidectomy    . Total vaginal hysterectomy      Complete  . US echocardiography  03-12-2006    Est EF 55-60%   History reviewed. No pertinent family history. History  Substance Use Topics  . Smoking status: Never Smoker   . Smokeless tobacco: Not on file  . Alcohol Use: No   OB History   Grav Para Term Preterm Abortions TAB SAB Ect Mult Living                 Review of Systems  Constitutional: Positive for fatigue. Negative  for fever, chills and diaphoresis.  Respiratory: Negative for cough and shortness of breath.   Cardiovascular: Positive for palpitations ( skipped beats). Negative for chest pain.  Gastrointestinal: Negative for nausea, vomiting and diarrhea.  Musculoskeletal: Positive for myalgias ( shoulder pain).  Neurological: Positive for weakness ( general). Negative for syncope.  All other systems reviewed and are negative.    Allergies  Review of patient's allergies indicates no known allergies.  Home Medications   Current Outpatient Rx  Name  Route  Sig  Dispense  Refill  . aspirin 81 MG tablet   Oral   Take 81 mg by mouth daily. Takes occ         . Cholecalciferol (VITAMIN D PO)   Oral   Take by mouth.           . levothyroxine (SYNTHROID, LEVOTHROID) 100 MCG tablet   Oral   Take 100 mcg by mouth daily.           . Multiple Vitamin (MULTI-VITAMIN PO)   Oral   Take by mouth.           . Naproxen Sodium (ALEVE PO)   Oral   Take 1 tablet by mouth daily.         Marland Kitchen VITAMIN E PO  Oral   Take 1 tablet by mouth daily.          BP 112/65  Pulse 101  Temp(Src) 98.7 F (37.1 C) (Oral)  Resp 18  Ht 5\' 5"  (1.651 m)  Wt 135 lb (61.236 kg)  BMI 22.47 kg/m2  SpO2 99% Physical Exam  Nursing note and vitals reviewed. Constitutional: She appears well-developed and well-nourished.  Pt lying comfortably in exam bed. NAD.  HENT:  Head: Normocephalic and atraumatic.  Eyes: Conjunctivae are normal. No scleral icterus.  Neck: Normal range of motion. Neck supple.  Cardiovascular: Normal rate and normal heart sounds.  An irregularly irregular rhythm present.  Pulmonary/Chest: Effort normal and breath sounds normal. No respiratory distress. She has no wheezes. She has no rales. She exhibits no tenderness.  Abdominal: Soft. Bowel sounds are normal. She exhibits no distension and no mass. There is no tenderness. There is no rebound and no guarding.  Musculoskeletal: Normal range  of motion.  Neurological: She is alert.  Skin: Skin is warm and dry.    ED Course  Procedures (including critical care time) Labs Reviewed - No data to display No results found. No diagnosis found.  MDM  DDx: new-onset Afib, MI, ACS, infection, dehydration  Will perform cardiac workup and attempt to get pt into NSR with Cardizem   Tx: cardizem, fluids.  Labs: CBC, CMP, Mg, TSH, T4, UA, Troponin.  Imaging: EKG, CXR  CBC: unremarkable  Other labs still pending and CXR pending.  Pt signed out to Dr. Venora Maples.  Plan is to d/c pt if able to convert pt back to NSR, if still IRR IRR, will consult [facility] for further direction.      Noland Fordyce, PA-C 10/12/12 1605

## 2012-10-12 NOTE — ED Notes (Signed)
Per Nonie Hoyer, RN pt needs stepdown because pt is on titrated Cardizem gtt, and MD Wynonia Lawman gave her a verbal order to send pt to [facility] bed.

## 2012-10-12 NOTE — ED Notes (Signed)
Pt presents to ED via [facility] from urgent care with c/o weakness. While at urgent care patient placed on monitor noted to be in afib with rate 98-135. On arrival to ed pt afib 90-105. 20g in place from urgent care.

## 2012-10-12 NOTE — ED Provider Notes (Signed)
History    CSN: [id number] Arrival date & time 10/12/12  1249  None    Chief Complaint  Patient presents with  . Weakness  . Dizziness   (Consider location/radiation/quality/duration/timing/severity/associated sxs/prior Treatment) Patient is a 79 y.o. female presenting with weakness. The history is provided by the patient.  Weakness This is a new problem. The current episode started 2 days ago. The problem has not changed since onset.Associated symptoms include chest pain. Pertinent negatives include no abdominal pain and no shortness of breath.   Past Medical History  Diagnosis Date  . 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 History of thyroidectomy   . Dilated cardiomyopathy   . Aortic insufficiency     mild to moderate  . PAC (premature atrial contraction)     Chronic  . Hypothyroidism   . Idiopathic cardiomyopathy 1999    Hx with last EF in 2012 was 50-55%  . Goiter    Past Surgical History  Procedure Laterality Date  . Cardiac catheterization    . Thyroidectomy    . Total vaginal hysterectomy      Complete  . US echocardiography  03-12-2006    Est EF 55-60%   No family history on file. History  Substance Use Topics  . Smoking status: Never Smoker   . Smokeless tobacco: Not on file  . Alcohol Use: No   OB History   Grav Para Term Preterm Abortions TAB SAB Ect Mult Living                 Review of Systems  HENT: Negative.   Respiratory: Negative for shortness of breath and wheezing.   Cardiovascular: Positive for chest pain and palpitations. Negative for leg swelling.  Gastrointestinal: Negative for nausea, vomiting, abdominal pain and diarrhea.  Musculoskeletal: Positive for back pain.  Neurological: Positive for weakness and light-headedness.    Allergies  Review of patient's allergies indicates no known allergies.  Home Medications   Current Outpatient Rx  Name  Route  Sig  Dispense  Refill  . aspirin 81 MG tablet  Oral   Take 81 mg by mouth daily. Takes occ         . Cholecalciferol (VITAMIN D PO)   Oral   Take by mouth.           . levothyroxine (SYNTHROID, LEVOTHROID) 100 MCG tablet   Oral   Take 100 mcg by mouth daily.           . Multiple Vitamin (MULTI-VITAMIN PO)   Oral   Take by mouth.            BP 111/70  Pulse 88  Temp(Src) 97.4 F (36.3 C) (Oral)  Resp 18  SpO2 96% Physical Exam  Nursing note and vitals reviewed. Constitutional: She is oriented to person, place, and time. She appears well-developed and well-nourished. No distress.  Neck: Normal range of motion. Neck supple. No JVD present.  Cardiovascular: Normal heart sounds and normal pulses.  An irregularly irregular rhythm present. PMI is not displaced.   Pulmonary/Chest: Effort normal and breath sounds normal.  Abdominal: Soft. Bowel sounds are normal. There is no tenderness.  Musculoskeletal: She exhibits no edema.  Lymphadenopathy:    She has no cervical adenopathy.  Neurological: She is alert and oriented to person, place, and time.  Skin: Skin is warm and dry.  Psychiatric: She has a normal mood and affect.    ED Course  Procedures (including critical  care time) Labs Reviewed - No data to display No results found. 1. Atrial fibrillation with rapid ventricular response     MDM  ecg- afib.  Sent for e/m of apparently new onset rapid afib with assoc cp. Currently stable.  Billy Fischer, MD 10/12/12 [phone]

## 2012-10-12 NOTE — H&P (Signed)
History and Physical   Admit date: 10/12/2012 Name:  Julie Lara Medical record number: [id number] DOB/Age:  October 22, 1920  79 y.o. female  Referring Physician:  [facility] Primary Cardiologist:  Dr. Peter Martinique Primary Physician:  Dr. Burnard Bunting  Chief complaint/reason for admission:  Generalized weakness  HPI:  This 79 year old female has a history of mild aortic regurgitation and a history of idiopathic cardiomyopathy that evidently resolved. She has hypothyroidism with a prior thyroidectomy. She has had previous PACs. She was awakened from sleep with chest discomfort and generalized shoulder pain on Friday evening and took 2 aspirin. She awoke on Saturday morning feeling better but severely weak and was unable to get around and do her normal activities because of severe weakness on Saturday. This morning she awoke and was severely weak and was taken to the urgent care Center where she was found to be in rapid atrial fibrillation. A BNP level was elevated and troponins were normal and she was transferred to the emergency room. She has been placed on treatment with intravenous diltiazem and still complains of weakness. She is not currently having chest discomfort. She is admitted to the hospital for evaluation of atrial fibrillation.   Past Medical History  Diagnosis Date  . 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 History of thyroidectomy   . Dilated cardiomyopathy   . Aortic insufficiency     mild to moderate  . PAC (premature atrial contraction)     Chronic  . Hypothyroidism   . Idiopathic cardiomyopathy 1999    Hx with last EF in 2012 was 50-55%  . Goiter        Past Surgical History  Procedure Laterality Date  . Cardiac catheterization    . Thyroidectomy    . Total vaginal hysterectomy      Complete   Allergies: has No Known Allergies.   Medications: Prior to Admission medications   Medication Sig Start Date End Date Taking?  Authorizing Provider  aspirin 81 MG tablet Take 81 mg by mouth daily. Takes occ   Yes Historical Provider, MD  Cholecalciferol (VITAMIN D PO) Take by mouth.     Yes Historical Provider, MD  levothyroxine (SYNTHROID, LEVOTHROID) 100 MCG tablet Take 100 mcg by mouth daily.     Yes Historical Provider, MD  Multiple Vitamin (MULTI-VITAMIN PO) Take by mouth.     Yes Historical Provider, MD  Naproxen Sodium (ALEVE PO) Take 1 tablet by mouth daily.   Yes Historical Provider, MD  VITAMIN E PO Take 1 tablet by mouth daily.   Yes Historical Provider, MD   Family History:  Family Status  Relation Status Death Age  . Mother Deceased 30    Unknown Causes  . Father Deceased 19    Intestinal Cancer  . Sister Deceased 39    Died with complications of diabetes  . Brother Deceased 34    [place]  . Brother Deceased 50    Died in a Furniture conservator/restorer accident    Social History:   reports that she has never smoked. She has never used smokeless tobacco. She reports that she does not drink alcohol or use illicit drugs.   History   Social History Narrative  . No narrative on file     Review of Systems: States that her left foot was swollen about 2-3 weeks ago and spontaneously went down. Complains of occasional arthritis involving her feet. She complains of significant urinary frequency. She has no  history of GI bleeding or abdominal pain. She denies PND, orthopnea or claudication. Other than as noted above, the remainder of the review of systems is normal  Physical Exam: BP 118/56  Pulse 83  Temp(Src) 98.7 F (37.1 C) (Oral)  Resp 16  Ht 5\' 5"  (1.651 m)  Wt 61.236 kg (135 lb)  BMI 22.47 kg/m2  SpO2 96%  General appearance: Pleasant white female who appears younger than stated age of 89 Head: Normocephalic, without obvious abnormality, atraumatic Neck: no adenopathy, no carotid bruit, no JVD and supple, symmetrical, trachea midline Lungs: clear to auscultation bilaterally Heart: Irregular  rhythm, normal S1-S2, no S3, [id number] systolic murmur, 1/6 diastolic murmur Abdomen: soft, non-tender; bowel sounds normal; no masses,  no organomegaly Pelvic: deferred Extremities: Trace edema, mild venous changes noted Pulses: 2+ and symmetric Skin: Skin color, texture, turgor normal. No rashes or lesions Neurologic: Grossly normal  Labs: CBC  Recent Labs  10/12/12 1514  WBC 6.3  RBC 4.31  HGB 13.1  HCT 38.1  PLT 190  MCV 88.4  MCH 30.4  MCHC 34.4  RDW 14.1   CMP   Recent Labs  10/12/12 1633  NA 136  K 5.1  CL 100  CO2 24  GLUCOSE 103*  BUN 28*  CREATININE 1.57*  CALCIUM 9.7  PROT 7.1  ALBUMIN 3.6  AST 15  ALT 10  ALKPHOS 85  BILITOT 0.6  GFRNONAA 28*  GFRAA 32*   BNP (last 3 results)  Recent Labs  10/12/12 1638  PROBNP 6020.0*   Cardiac Panel (last 3 results)  Recent Labs  10/12/12 1514  TROPONINI <0.30   EKG: Initially at urgent care was in atrial fibrillation with rapid response, currently in atrial fibrillation with controlled response nonspecific ST changes.  Radiology: Cardiomegaly with clear lung fields   IMPRESSIONS: 1. Atrial fibrillation with rapid ventricular response 2. Chest discomfort secondary to atrial fibrillation 3. Generalized weakness 4. Hypothyroidism under treatment  5. Chronic kidney disease stage 3-4  PLAN: She will be started on Eliquus adjusted for age and renal function. Continue diltiazem overnight and probably switch to oral therapy morning. It appears that the duration of atrial fibrillation is greater than 48 hours. Keep n.p.o. after midnight for further testing. Repeat echocardiogram and thyroid functions.  Signed: Kerry Hough MD [facility]  10/12/2012, 7:58 PM

## 2012-10-12 NOTE — ED Notes (Signed)
Reports having a pain across upper back/shoulder area extending over into chest over past 2 days; denies any pain at this time - states feels better after taking Aleve.  C/O general malaise, weakness, and some lightheadedness.  Denies any n/v/d, denies cold sxs.  Also took some baby ASA this morning.

## 2012-10-13 DIAGNOSIS — I214 Non-ST elevation (NSTEMI) myocardial infarction: Secondary | ICD-10-CM

## 2012-10-13 DIAGNOSIS — I359 Nonrheumatic aortic valve disorder, unspecified: Secondary | ICD-10-CM

## 2012-10-13 DIAGNOSIS — I4891 Unspecified atrial fibrillation: Secondary | ICD-10-CM

## 2012-10-13 LAB — TROPONIN I: Troponin I: 0.34 ng/mL (ref <0.30)

## 2012-10-13 LAB — TSH: TSH: 0.155 u[IU]/mL — ABNORMAL LOW (ref 0.350–4.500)

## 2012-10-13 LAB — PROTIME-INR
INR: 1.16 (ref 0.00–1.49)
Prothrombin Time: 14.6 seconds (ref 11.6–15.2)

## 2012-10-13 MED ORDER — LEVOTHYROXINE SODIUM 50 MCG PO TABS
50.0000 ug | ORAL_TABLET | Freq: Every day | ORAL | Status: DC
  Administered 2012-10-14 – 2012-10-15 (×2): 50 ug via ORAL
  Filled 2012-10-13 (×5): qty 1

## 2012-10-13 MED ORDER — ASPIRIN 81 MG PO CHEW
324.0000 mg | CHEWABLE_TABLET | ORAL | Status: AC
  Administered 2012-10-14: 324 mg via ORAL
  Filled 2012-10-13: qty 4

## 2012-10-13 MED ORDER — SODIUM CHLORIDE 0.9 % IV SOLN: 1.0000 mL/kg/h | INTRAVENOUS | Status: DC

## 2012-10-13 MED ORDER — SODIUM CHLORIDE 0.9 % IV SOLN: 250.0000 mL | INTRAVENOUS | Status: DC | PRN

## 2012-10-13 MED ORDER — SODIUM CHLORIDE 0.9 % IJ SOLN: 3.0000 mL | Freq: Two times a day (BID) | INTRAMUSCULAR | Status: DC

## 2012-10-13 MED ORDER — SODIUM CHLORIDE 0.9 % IJ SOLN: 3.0000 mL | INTRAMUSCULAR | Status: DC | PRN

## 2012-10-13 NOTE — Progress Notes (Signed)
CRITICAL VALUE ALERT  Critical value received:  Troponin  Date of notification:  10-12-2012  Time of notification:  2150  Critical value read back:yes  Nurse who received alert:  Reino Bellis  MD notified (1st page):  Wynonia Lawman  Time of first page:  2202  MD notified (2nd page): Wynonia Lawman  Time of second page: 2235  Responding MD:  Colon Flattery  Time MD responded:  2242

## 2012-10-13 NOTE — Progress Notes (Signed)
Utilization review completed.  

## 2012-10-13 NOTE — Progress Notes (Signed)
SUBJECTIVE:  No further chest pain.  No SOB.     PHYSICAL EXAM Filed Vitals:   10/12/12 2349 10/13/12 0445 10/13/12 0753 10/13/12 0828  BP: 122/50 100/80 147/76   Pulse: 71 60    Temp: 98 F (36.7 C) 98 F (36.7 C)  97.9 F (36.6 C)  TempSrc: Oral Oral  Axillary  Resp: 23 24    Height:      Weight:      SpO2: 99% 99%     General:  No distress HEENT:  PERRL Lungs:  Clear Heart:  RRR Abdomen:  Positive bowel sounds, no rebound no guarding Extremities:  No edema Neuro:  Nonfocal  LABS: Lab Results  Component Value Date   TROPONINI 0.34* 10/13/2012   Results for orders placed during the hospital encounter of 10/12/12 (from the past 24 hour(s))  TROPONIN I     Status: None   Collection Time    10/12/12  3:14 PM      Result Value Range   Troponin I <0.30  <0.30 ng/mL  CBC     Status: None   Collection Time    10/12/12  3:14 PM      Result Value Range   WBC 6.3  4.0 - 10.5 K/uL   RBC 4.31  3.87 - 5.11 MIL/uL   Hemoglobin 13.1  12.0 - 15.0 g/dL   HCT 38.1  36.0 - 46.0 %   MCV 88.4  78.0 - 100.0 fL   MCH 30.4  26.0 - 34.0 pg   MCHC 34.4  30.0 - 36.0 g/dL   RDW 14.1  11.5 - 15.5 %   Platelets 190  150 - 400 K/uL  MAGNESIUM     Status: None   Collection Time    10/12/12  3:14 PM      Result Value Range   Magnesium 2.3  1.5 - 2.5 mg/dL  COMPREHENSIVE METABOLIC PANEL     Status: Abnormal   Collection Time    10/12/12  4:33 PM      Result Value Range   Sodium 136  135 - 145 mEq/L   Potassium 5.1  3.5 - 5.1 mEq/L   Chloride 100  96 - 112 mEq/L   CO2 24  19 - 32 mEq/L   Glucose, Bld 103 (*) 70 - 99 mg/dL   BUN 28 (*) 6 - 23 mg/dL   Creatinine, Ser 1.57 (*) 0.50 - 1.10 mg/dL   Calcium 9.7  8.4 - 10.5 mg/dL   Total Protein 7.1  6.0 - 8.3 g/dL   Albumin 3.6  3.5 - 5.2 g/dL   AST 15  0 - 37 U/L   ALT 10  0 - 35 U/L   Alkaline Phosphatase 85  39 - 117 U/L   Total Bilirubin 0.6  0.3 - 1.2 mg/dL   GFR calc non Af Amer 28 (*) >90 mL/min   GFR calc Af Amer 32 (*)  >90 mL/min  TSH     Status: Abnormal   Collection Time    10/12/12  4:33 PM      Result Value Range   TSH 0.130 (*) 0.350 - 4.500 uIU/mL  T4, FREE     Status: None   Collection Time    10/12/12  4:33 PM      Result Value Range   Free T4 1.47  0.80 - 1.80 ng/dL  PRO B NATRIURETIC PEPTIDE     Status: Abnormal   Collection Time  10/12/12  4:38 PM      Result Value Range   Pro B Natriuretic peptide (BNP) 6020.0 (*) 0 - 450 pg/mL  TROPONIN I     Status: Abnormal   Collection Time    10/12/12  8:25 PM      Result Value Range   Troponin I 0.32 (*) <0.30 ng/mL  TSH     Status: Abnormal   Collection Time    10/12/12  8:25 PM      Result Value Range   TSH 0.155 (*) 0.350 - 4.500 uIU/mL  T4, FREE     Status: None   Collection Time    10/12/12  8:25 PM      Result Value Range   Free T4 1.48  0.80 - 1.80 ng/dL  MRSA PCR SCREENING     Status: None   Collection Time    10/12/12 10:03 PM      Result Value Range   MRSA by PCR NEGATIVE  NEGATIVE  TROPONIN I     Status: Abnormal   Collection Time    10/13/12  2:11 AM      Result Value Range   Troponin I 0.34 (*) <0.30 ng/mL    Intake/Output Summary (Last 24 hours) at 10/13/12 1106 Last data filed at 10/13/12 [id number]  Gross per 24 hour  Intake  766.5 ml  Output      0 ml  Net  766.5 ml    EKG:  Pending  ASSESSMENT AND PLAN:  Atrial fibrillation:  Discussed with the patient and family.  Plan cardiac cath as below.  Further plans pending these results.    Hypothyroidism: TSH depressed.  Reduce Synthroid.   NQWMI:  Cath in AM.  The patient understands that risks included but are not limited to stroke (1 in 1000), death (1 in 50), kidney failure [usually temporary] (1 in 500), bleeding (1 in 200), allergic reaction [possibly serious] (1 in 200).  The patient understands and agrees to proceed.   CKD Stage III:  Hydrate prior to cath.  No LV gram.    Minus Breeding 10/13/2012 11:06 AM

## 2012-10-13 NOTE — Progress Notes (Signed)
  Echocardiogram 2D Echocardiogram has been performed.  [name] 10/13/2012, 11:10 AM

## 2012-10-14 ENCOUNTER — Encounter (HOSPITAL_COMMUNITY)
Admission: EM | Disposition: A | Discharge status: Home / Self Care | Payer: Self-pay | Source: Home / Self Care | Attending: Cardiology

## 2012-10-14 DIAGNOSIS — I2510 Atherosclerotic heart disease of native coronary artery without angina pectoris: Secondary | ICD-10-CM

## 2012-10-14 DIAGNOSIS — I214 Non-ST elevation (NSTEMI) myocardial infarction: Secondary | ICD-10-CM

## 2012-10-14 HISTORY — PX: PERCUTANEOUS CORONARY STENT INTERVENTION (PCI-S): SHX5485

## 2012-10-14 HISTORY — PX: LEFT HEART CATHETERIZATION WITH CORONARY ANGIOGRAM: SHX5451

## 2012-10-14 LAB — POCT ACTIVATED CLOTTING TIME: Activated Clotting Time: 196 seconds

## 2012-10-14 LAB — URINALYSIS, ROUTINE W REFLEX MICROSCOPIC
Nitrite: POSITIVE — AB
Specific Gravity, Urine: 1.017 (ref 1.005–1.030)
Urobilinogen, UA: 0.2 mg/dL (ref 0.0–1.0)

## 2012-10-14 LAB — BASIC METABOLIC PANEL
GFR calc Af Amer: 51 mL/min — ABNORMAL LOW (ref >90)
GFR calc non Af Amer: 44 mL/min — ABNORMAL LOW (ref >90)
Potassium: 3.9 mEq/L (ref 3.5–5.1)
Sodium: 138 mEq/L (ref 135–145)

## 2012-10-14 SURGERY — LEFT HEART CATHETERIZATION WITH CORONARY ANGIOGRAM
Anesthesia: LOCAL | Site: Hand | Laterality: Right

## 2012-10-14 MED ORDER — CLOPIDOGREL BISULFATE 300 MG PO TABS
ORAL_TABLET | ORAL | Status: AC
  Filled 2012-10-14: qty 1

## 2012-10-14 MED ORDER — CLOPIDOGREL BISULFATE 75 MG PO TABS
ORAL_TABLET | ORAL | Status: AC
  Filled 2012-10-14: qty 8

## 2012-10-14 MED ORDER — VERAPAMIL HCL 2.5 MG/ML IV SOLN
INTRAVENOUS | Status: AC
  Filled 2012-10-14: qty 2

## 2012-10-14 MED ORDER — CLOPIDOGREL BISULFATE 75 MG PO TABS
75.0000 mg | ORAL_TABLET | Freq: Every day | ORAL | Status: DC
  Administered 2012-10-15: 08:00:00 75 mg via ORAL
  Filled 2012-10-14: qty 1

## 2012-10-14 MED ORDER — HYDRALAZINE HCL 20 MG/ML IJ SOLN
10.0000 mg | INTRAMUSCULAR | Status: DC | PRN
  Administered 2012-10-15: 03:00:00 10 mg via INTRAVENOUS
  Filled 2012-10-14 (×2): qty 1

## 2012-10-14 MED ORDER — NITROGLYCERIN 0.2 MG/ML ON CALL CATH LAB
INTRAVENOUS | Status: AC
  Filled 2012-10-14: qty 1

## 2012-10-14 MED ORDER — SODIUM CHLORIDE 0.9 % IV SOLN: INTRAVENOUS | Status: AC

## 2012-10-14 MED ORDER — SODIUM CHLORIDE 0.9 % IJ SOLN
3.0000 mL | Freq: Two times a day (BID) | INTRAMUSCULAR | Status: DC
  Administered 2012-10-14 – 2012-10-15 (×2): 3 mL via INTRAVENOUS

## 2012-10-14 MED ORDER — APIXABAN 2.5 MG PO TABS
2.5000 mg | ORAL_TABLET | Freq: Two times a day (BID) | ORAL | Status: DC
  Administered 2012-10-15: 2.5 mg via ORAL
  Filled 2012-10-14 (×2): qty 1

## 2012-10-14 MED ORDER — MIDAZOLAM HCL 2 MG/2ML IJ SOLN
INTRAMUSCULAR | Status: AC
  Filled 2012-10-14: qty 2

## 2012-10-14 MED ORDER — HEPARIN SODIUM (PORCINE) 1000 UNIT/ML IJ SOLN
INTRAMUSCULAR | Status: AC
  Filled 2012-10-14: qty 1

## 2012-10-14 MED ORDER — ONDANSETRON HCL 4 MG/2ML IJ SOLN: 4.0000 mg | Freq: Four times a day (QID) | INTRAMUSCULAR | Status: DC | PRN

## 2012-10-14 MED ORDER — METOPROLOL TARTRATE 25 MG PO TABS
25.0000 mg | ORAL_TABLET | Freq: Two times a day (BID) | ORAL | Status: DC
  Administered 2012-10-14 – 2012-10-15 (×3): 25 mg via ORAL
  Filled 2012-10-14 (×4): qty 1

## 2012-10-14 MED ORDER — LIDOCAINE HCL (PF) 1 % IJ SOLN
INTRAMUSCULAR | Status: AC
  Filled 2012-10-14: qty 30

## 2012-10-14 MED ORDER — ATORVASTATIN CALCIUM 40 MG PO TABS
40.0000 mg | ORAL_TABLET | Freq: Every day | ORAL | Status: DC
  Administered 2012-10-14: 40 mg via ORAL
  Filled 2012-10-14 (×2): qty 1

## 2012-10-14 MED ORDER — FENTANYL CITRATE 0.05 MG/ML IJ SOLN
INTRAMUSCULAR | Status: AC
  Filled 2012-10-14: qty 2

## 2012-10-14 MED ORDER — HEPARIN (PORCINE) IN NACL 2-0.9 UNIT/ML-% IJ SOLN
INTRAMUSCULAR | Status: AC
  Filled 2012-10-14: qty 1000

## 2012-10-14 NOTE — Interval H&P Note (Signed)
History and Physical Interval Note:  10/14/2012 7:41 AM  Julie Lara  has presented today for surgery, with the diagnosis of cad  The various methods of treatment have been discussed with the patient and family. After consideration of risks, benefits and other options for treatment, the patient has consented to  Procedure(s): LEFT HEART CATHETERIZATION WITH CORONARY ANGIOGRAM (N/A) as a surgical intervention .  The patient's history has been reviewed, patient examined, no change in status, stable for surgery.  I have reviewed the patient's chart and labs.  Questions were answered to the patient's satisfaction.     [name]

## 2012-10-14 NOTE — Progress Notes (Signed)
TR BAND REMOVAL  LOCATION:  right radial  DEFLATED PER PROTOCOL:  yes  TIME BAND OFF / DRESSING APPLIED:   1200   SITE UPON ARRIVAL:   Level 0  SITE AFTER BAND REMOVAL:  Level 0  REVERSE ALLEN'S TEST:    positive  CIRCULATION SENSATION AND MOVEMENT:  Within Normal Limits  yes  COMMENTS:     

## 2012-10-14 NOTE — CV Procedure (Signed)
Cardiac Catheterization Procedure Note  Name: Julie Lara MRN: [id number] DOB: 30-Jan-1921  Procedure: Left Heart Cath, Selective Coronary Angiography, PTCA and stenting of the proximal RCA  Indication: Non-ST elevation myocardial infarction.  Medications:  Sedation:  0.5 mg IV Versed, 25 mcg IV Fentanyl  Contrast:  80 mL Omnipaque  Procedural Details: The right wrist was prepped, draped, and anesthetized with 1% lidocaine. Using the modified Seldinger technique, a 5 French Slender sheath was introduced into the right radial artery. 3 mg of verapamil was administered through the sheath, weight-based unfractionated heparin was administered intravenously. A Jackie catheter was used for selective coronary angiography. A JR 4 catheter was used to engage the right coronary artery. It was also used to cross the aortic valve and record pressures. Left ventricular angiography was not performed due to chronic kidney disease.Catheter exchanges were performed over an exchange length guidewire. There were no immediate procedural complications.  Procedural Findings:  Hemodynamics: AO:  178/77   mmHg LV:  180/6    mmHg LVEDP: 18  mmHg  Coronary angiography: Coronary dominance: Codominant   Left Main:  Normal  Left Anterior Descending (LAD):  Normal in size with moderate calcifications. There is a 50% ostial stenosis followed by diffuse 40% disease proximally. There is a 40% discrete stenosis in the midsegment. The rest of the vessel has minor irregularities.  1st diagonal (D1):  Normal in size with 20% ostial stenosis  2nd diagonal (D2):  Normal in size with minor irregularities.  3rd diagonal (D3):  Very small in size.  Circumflex (LCx):  Normal in size and codominant. The vessel has minor irregularities with no evidence of obstructive disease.  1st obtuse marginal:  Very small in size with no significant disease.  2nd obtuse marginal:  Large in size with minor irregularities.  3rd  obtuse marginal:  Small in size with no significant disease.   AV groove continuation segment: Large in size with no significant disease. It gives 2 posterolateral branches which are free of significant disease.  Right Coronary Artery: Normal in size and codominant. There is a discrete 95% stenosis proximally. The rest of the vessel has no significant disease.  Posterior descending artery: Normal in size with no significant disease.  Left ventriculography: Was not performed.  PCI Note:  Following the diagnostic procedure, the decision was made to proceed with PCI.  I decided to do the case with unfractionated heparin given that the patient is on anticoagulation. In addition of 2500 units of unfractionated heparin was given with an ACT of 252. Once a therapeutic ACT was achieved, a 6 [place] JR 4 guide catheter was inserted.  A run through coronary guidewire was used to cross the lesion.  The lesion was predilated with a 2.5 x 12 balloon.  The lesion was then stented with a 3.0 x 15 vision bare-metal stent.  The stent was postdilated with a 3.25 x 12 noncompliant balloon.  Following PCI, there was 0% residual stenosis and TIMI-3 flow. Final angiography confirmed an excellent result. The patient tolerated the procedure well. There were no immediate procedural complications. A TR band was used for radial hemostasis. The patient was transferred to the post catheterization recovery area for further monitoring.  PCI Data: Vessel - proximal RCA/Segment - 1 Percent Stenosis (pre)  95% TIMI-flow 3 Stent 3.0 x 15 mm vision bare-metal stent Percent Stenosis (post) 0% TIMI-flow (post) 3   Final Conclusions:  1. Severe one-vessel coronary artery disease. 2. Successful angioplasty and bare-metal stent placement to the  proximal right coronary artery. 3. Elevated BP with mildly elevated LVEDP.   Recommendations:  I will resume Eliquis tomorrow morning. I recommend treatment with Plavix without aspirin for  one month. After one month, Plavix can be switched to aspirin 81 mg once daily to be continued with  anticoagulation. I will start small dose metoprolol 25 mg twice daily and atorvastatin.  [name] MD, [facility] 10/14/2012, 8:54 AM

## 2012-10-14 NOTE — H&P (View-Only) (Signed)
SUBJECTIVE:  No further chest pain.  No SOB.     PHYSICAL EXAM Filed Vitals:   10/12/12 2349 10/13/12 0445 10/13/12 0753 10/13/12 0828  BP: 122/50 100/80 147/76   Pulse: 71 60    Temp: 98 F (36.7 C) 98 F (36.7 C)  97.9 F (36.6 C)  TempSrc: Oral Oral  Axillary  Resp: 23 24    Height:      Weight:      SpO2: 99% 99%     General:  No distress HEENT:  PERRL Lungs:  Clear Heart:  RRR Abdomen:  Positive bowel sounds, no rebound no guarding Extremities:  No edema Neuro:  Nonfocal  LABS: Lab Results  Component Value Date   TROPONINI 0.34* 10/13/2012   Results for orders placed during the hospital encounter of 10/12/12 (from the past 24 hour(s))  TROPONIN I     Status: None   Collection Time    10/12/12  3:14 PM      Result Value Range   Troponin I <0.30  <0.30 ng/mL  CBC     Status: None   Collection Time    10/12/12  3:14 PM      Result Value Range   WBC 6.3  4.0 - 10.5 K/uL   RBC 4.31  3.87 - 5.11 MIL/uL   Hemoglobin 13.1  12.0 - 15.0 g/dL   HCT 38.1  36.0 - 46.0 %   MCV 88.4  78.0 - 100.0 fL   MCH 30.4  26.0 - 34.0 pg   MCHC 34.4  30.0 - 36.0 g/dL   RDW 14.1  11.5 - 15.5 %   Platelets 190  150 - 400 K/uL  MAGNESIUM     Status: None   Collection Time    10/12/12  3:14 PM      Result Value Range   Magnesium 2.3  1.5 - 2.5 mg/dL  COMPREHENSIVE METABOLIC PANEL     Status: Abnormal   Collection Time    10/12/12  4:33 PM      Result Value Range   Sodium 136  135 - 145 mEq/L   Potassium 5.1  3.5 - 5.1 mEq/L   Chloride 100  96 - 112 mEq/L   CO2 24  19 - 32 mEq/L   Glucose, Bld 103 (*) 70 - 99 mg/dL   BUN 28 (*) 6 - 23 mg/dL   Creatinine, Ser 1.57 (*) 0.50 - 1.10 mg/dL   Calcium 9.7  8.4 - 10.5 mg/dL   Total Protein 7.1  6.0 - 8.3 g/dL   Albumin 3.6  3.5 - 5.2 g/dL   AST 15  0 - 37 U/L   ALT 10  0 - 35 U/L   Alkaline Phosphatase 85  39 - 117 U/L   Total Bilirubin 0.6  0.3 - 1.2 mg/dL   GFR calc non Af Amer 28 (*) >90 mL/min   GFR calc Af Amer 32 (*)  >90 mL/min  TSH     Status: Abnormal   Collection Time    10/12/12  4:33 PM      Result Value Range   TSH 0.130 (*) 0.350 - 4.500 uIU/mL  T4, FREE     Status: None   Collection Time    10/12/12  4:33 PM      Result Value Range   Free T4 1.47  0.80 - 1.80 ng/dL  PRO B NATRIURETIC PEPTIDE     Status: Abnormal   Collection Time  10/12/12  4:38 PM      Result Value Range   Pro B Natriuretic peptide (BNP) 6020.0 (*) 0 - 450 pg/mL  TROPONIN I     Status: Abnormal   Collection Time    10/12/12  8:25 PM      Result Value Range   Troponin I 0.32 (*) <0.30 ng/mL  TSH     Status: Abnormal   Collection Time    10/12/12  8:25 PM      Result Value Range   TSH 0.155 (*) 0.350 - 4.500 uIU/mL  T4, FREE     Status: None   Collection Time    10/12/12  8:25 PM      Result Value Range   Free T4 1.48  0.80 - 1.80 ng/dL  MRSA PCR SCREENING     Status: None   Collection Time    10/12/12 10:03 PM      Result Value Range   MRSA by PCR NEGATIVE  NEGATIVE  TROPONIN I     Status: Abnormal   Collection Time    10/13/12  2:11 AM      Result Value Range   Troponin I 0.34 (*) <0.30 ng/mL    Intake/Output Summary (Last 24 hours) at 10/13/12 1106 Last data filed at 10/13/12 [id number]  Gross per 24 hour  Intake  766.5 ml  Output      0 ml  Net  766.5 ml    EKG:  Pending  ASSESSMENT AND PLAN:  Atrial fibrillation:  Discussed with the patient and family.  Plan cardiac cath as below.  Further plans pending these results.    Hypothyroidism: TSH depressed.  Reduce Synthroid.   NQWMI:  Cath in AM.  The patient understands that risks included but are not limited to stroke (1 in 1000), death (1 in 75), kidney failure [usually temporary] (1 in 500), bleeding (1 in 200), allergic reaction [possibly serious] (1 in 200).  The patient understands and agrees to proceed.   CKD Stage III:  Hydrate prior to cath.  No LV gram.    Minus Breeding 10/13/2012 11:06 AM

## 2012-10-15 ENCOUNTER — Other Ambulatory Visit: Payer: Self-pay | Admitting: Physician Assistant

## 2012-10-15 ENCOUNTER — Encounter (HOSPITAL_COMMUNITY): Payer: Self-pay | Admitting: Physician Assistant

## 2012-10-15 DIAGNOSIS — N183 Chronic kidney disease, stage 3 unspecified: Secondary | ICD-10-CM

## 2012-10-15 DIAGNOSIS — N390 Urinary tract infection, site not specified: Secondary | ICD-10-CM

## 2012-10-15 DIAGNOSIS — I4891 Unspecified atrial fibrillation: Principal | ICD-10-CM

## 2012-10-15 DIAGNOSIS — I214 Non-ST elevation (NSTEMI) myocardial infarction: Secondary | ICD-10-CM

## 2012-10-15 DIAGNOSIS — E039 Hypothyroidism, unspecified: Secondary | ICD-10-CM

## 2012-10-15 HISTORY — DX: Urinary tract infection, site not specified: N39.0

## 2012-10-15 LAB — BASIC METABOLIC PANEL
BUN: 15 mg/dL (ref 6–23)
Creatinine, Ser: 1.06 mg/dL (ref 0.50–1.10)
GFR calc Af Amer: 52 mL/min — ABNORMAL LOW (ref >90)
GFR calc non Af Amer: 45 mL/min — ABNORMAL LOW (ref >90)
Potassium: 4.1 mEq/L (ref 3.5–5.1)

## 2012-10-15 LAB — CBC
HCT: 35.2 % — ABNORMAL LOW (ref 36.0–46.0)
MCHC: 34.7 g/dL (ref 30.0–36.0)
MCV: 87.1 fL (ref 78.0–100.0)
Platelets: 191 10*3/uL (ref 150–400)
RDW: 13.7 % (ref 11.5–15.5)
WBC: 5 10*3/uL (ref 4.0–10.5)

## 2012-10-15 MED ORDER — APIXABAN 2.5 MG PO TABS: 2.5000 mg | ORAL_TABLET | Freq: Two times a day (BID) | ORAL | Status: DC

## 2012-10-15 MED ORDER — NITROGLYCERIN 0.4 MG SL SUBL: 0.4000 mg | SUBLINGUAL_TABLET | SUBLINGUAL | Status: DC | PRN

## 2012-10-15 MED ORDER — METOPROLOL TARTRATE 25 MG PO TABS: 25.0000 mg | ORAL_TABLET | Freq: Two times a day (BID) | ORAL | Status: DC

## 2012-10-15 MED ORDER — AMLODIPINE BESYLATE 2.5 MG PO TABS
2.5000 mg | ORAL_TABLET | Freq: Every day | ORAL | Status: DC
  Administered 2012-10-15: 2.5 mg via ORAL
  Filled 2012-10-15: qty 1

## 2012-10-15 MED ORDER — LEVOTHYROXINE SODIUM 50 MCG PO TABS: 50.0000 ug | ORAL_TABLET | Freq: Every day | ORAL | Status: DC

## 2012-10-15 MED ORDER — AMLODIPINE BESYLATE 2.5 MG PO TABS: 2.5000 mg | ORAL_TABLET | Freq: Every day | ORAL | Status: DC

## 2012-10-15 MED ORDER — CLOPIDOGREL BISULFATE 75 MG PO TABS: 75.0000 mg | ORAL_TABLET | Freq: Every day | ORAL | Status: DC

## 2012-10-15 MED ORDER — ATORVASTATIN CALCIUM 40 MG PO TABS: 40.0000 mg | ORAL_TABLET | Freq: Every day | ORAL | Status: DC

## 2012-10-15 MED ORDER — HEART ATTACK BOUNCING BOOK
Freq: Once | Status: AC
  Administered 2012-10-15: 01:00:00
  Filled 2012-10-15: qty 1

## 2012-10-15 NOTE — Progress Notes (Signed)
Tele has been SB-SR mostly 55-65 while asleep but occasionally drops to 45 due to irregular rhythm (freq PAC's).  HR 70's when awake, 80's w/ ambulation to bathroom.  BP 175/88.  Metoprolol given PO.  Advised pt to watch for dizziness when getting up.  Daughter at bedside, call light in reach.

## 2012-10-15 NOTE — Progress Notes (Signed)
CARDIAC REHAB PHASE I   PRE:  Rate/Rhythm: 89 SR PAC's  BP:  Supine:   Sitting: 132/91  Standing:    SaO2:   MODE:  Ambulation: 500 ft   POST:  Rate/Rhythm: 113 ST freq PAC's  BP:  Supine:   Sitting: 137/72  Standing:    SaO2:  0900-1015 Pt tolerated ambulation well without c/o of cp or SOB. VS stable BP better after walk. Pt states that she feels weak from being in bed. Completed MI and stent education with pt and daughters. Pt voices some understanding, but has trouble with recalling information  using teach back. Both of pt's daughters will be there to help her and they are able to recall information. Discussed Outpt. CRP with pt, she agrees to referral to [facility] program.  Rodney Langton RN 10/15/2012 10:18 AM

## 2012-10-15 NOTE — Discharge Summary (Signed)
Discharge Summary   Patient ID: Julie Lara,  MRN: [id number], DOB/AGE: [birth year] 79 y.o.  Admit date: 10/12/2012 Discharge date: 10/15/2012  Primary Physician: Geoffery Lyons, MD Primary Cardiologist: P. Martinique, MD  Discharge Diagnoses Principal Problem:   Atrial fibrillation with RVR Active Problems:   Dilated cardiomyopathy   Aortic insufficiency   PAC (premature atrial contraction)   Hypothyroidism   CKD (chronic kidney disease), stage III   UTI (urinary tract infection)   Allergies No Known Allergies  Diagnostic Studies/Procedures  PA/LATERAL CHEST X-RAY - 10/12/12  IMPRESSION:  Stable cardiomegaly. No active lung disease.  CARDIAC CATHETERIZATION + PERCUTANEOUS CORONARY INTERVENTION - 10/14/12  Hemodynamics:  AO: 178/77 mmHg  LV: 180/6 mmHg  LVEDP: 18 mmHg   Coronary angiography:  Coronary dominance: Codominant  Left Main: Normal  Left Anterior Descending (LAD): Normal in size with moderate calcifications. There is a 50% ostial stenosis followed by diffuse 40% disease proximally. There is a 40% discrete stenosis in the midsegment. The rest of the vessel has minor irregularities.  1st diagonal (D1): Normal in size with 20% ostial stenosis  2nd diagonal (D2): Normal in size with minor irregularities.  3rd diagonal (D3): Very small in size.  Circumflex (LCx): Normal in size and codominant. The vessel has minor irregularities with no evidence of obstructive disease.  1st obtuse marginal: Very small in size with no significant disease.  2nd obtuse marginal: Large in size with minor irregularities.  3rd obtuse marginal: Small in size with no significant disease.  AV groove continuation segment: Large in size with no significant disease. It gives 2 posterolateral branches which are free of significant disease.  Right Coronary Artery: Normal in size and codominant. There is a discrete 95% stenosis proximally. The rest of the vessel has no significant disease.    Posterior descending artery: Normal in size with no significant disease. Left ventriculography: Was not performed.   PCI Data:  Vessel - proximal RCA/Segment - 1  Percent Stenosis (pre) 95%  TIMI-flow 3  Stent 3.0 x 15 mm vision bare-metal stent  Percent Stenosis (post) 0%  TIMI-flow (post) 3  Final Conclusions:  1. Severe one-vessel coronary artery disease.  2. Successful angioplasty and bare-metal stent placement to the proximal right coronary artery.  3. Elevated BP with mildly elevated LVEDP.   TRANSTHORACIC ECHOCARDIOGRAM - 10/13/12  - Left ventricle: The cavity size was moderately dilated. Wall thickness was increased in a pattern of mild LVH. The estimated ejection fraction was 40%. Diffuse hypokinesis.  - Aortic valve: Cannot tell if valve is tri leaflet or not Mild regurgitation. - Mitral valve: Mild regurgitation. - Left atrium: The atrium was mildly dilated. - Right atrium: The atrium was mildly dilated. - Atrial septum: No defect or patent foramen ovale was identified. - Impressions: Cannot r/o thrombus in IVC on limited subcostal window  Impressions:  - Cannot r/o thrombus in IVC on limited subcostal window  History of Present Illness Julie Lara is a 79 y.o. female who was admitted to [facility] with the above problem list.   She has a history of idiopathic dilated cardiomyopathy, mild-mod aortic insufficiency, chronic PACs, history of thyroidectomy and hypothyroidism.   She was evaluated in the [facility] ED after complaining of chest discomfort radiating to her shoulders and progressive weakness x 2-3 days. She had been evaluated in an urgent care where she was found to be in atrial fibrillation with RVR. BNP returned elevated and she was transferred to the ED.   There, EKG  confirmed atrial fibrillation with RVR. Initial trop-I WNL. pBNP 6020. CXR as above indicated stable cardiomegaly without acute cardiopulmonary process. She was started on Cardizem IV  with rate improvement. She was started on Eliquis for anticoagulation and observed for further monitoring.   Hospital Course   Serial troponins were obtained and did return mildly elevated. TSH returned depressed at 0.133. Synthroid was reduced to 50 mcg daily. She remained asymptomatic. The decision was made to proceed with cardiac catheterization. The risks, benefits and details of the procedure were discussed with the patient who agreed to proceed.   Prior to cath, she underwent 2D echo revealing EF 40%, mod LV dilatation, mild LVH, mild MR, ? trileaflet AV, mild biatrial enlargement, changes questionable for IVC thrombus.   The following day, she was informed, consented and prepped for cardiac cath which was accessed via the R radial artery. This revealed the above findings. This was notable for a 95% prox RCA lesions. A BMS was successfully placed to this area. The plan was made to proceed with Plavix without aspirin x 1 month, then to stop Plavix and start low-dose ASA given concomitant Eliquis use and risk of bleeding. Shew as started on metoprolol and atorvastatin.   She remained stable overnight. U/a did return with + nitrites, mod leukocytes. Urine culture pending. She will be started on empiric ciprofloxacin. She was evaluated by Dr. Fletcher Anon today and deemed stable for discharge. She will be discharged on the medication regimen outlined below. LFTs and lipid panel should be checked in 6 weeks since starting a statin. She will follow-up as scheduled (TCM). This information, including post-cath instructions and activity restrictions, has been clearly outlined in the discharge AVS.   Discharge Vitals:  Blood pressure 163/110, pulse 87, temperature 97.7 F (36.5 C), temperature source Oral, resp. rate 16, height 5\' 5"  (1.651 m), weight 63.5 kg (139 lb 15.9 oz), SpO2 97.00%.   Labs: Recent Labs     10/15/12  0533  WBC  5.0  HGB  12.2  HCT  35.2*  MCV  87.1  PLT  191    Recent Labs Lab  10/12/12 1633 10/14/12 0430 10/15/12 0533  NA 136 138 139  K 5.1 3.9 4.1  CL 100 106 105  CO2 24 23 25   BUN 28* 20 15  CREATININE 1.57* 1.08 1.06  CALCIUM 9.7 8.9 9.6  PROT 7.1  --   --   BILITOT 0.6  --   --   ALKPHOS 85  --   --   ALT 10  --   --   AST 15  --   --   GLUCOSE 103* 91 102*   Recent Labs     10/12/12  2025  10/13/12  0211  TROPONINI  0.32*  0.34*    Recent Labs  10/12/12 2025  TSH 0.155*    Disposition:  Discharge Orders   Future Orders Complete By Expires     Amb Referral to Cardiac Rehabilitation  As directed     Diet - low sodium heart healthy  As directed     Increase activity slowly  As directed           Follow-up Information   Follow up with ARONSON,RICHARD A, MD. Schedule an appointment as soon as possible for a visit in 1 week. (For general post-hospital follow-up and management of your low thyroid. )    Contact information:   [facility] [place] [address] [phone]  Follow up with [facility]. (Office will call you with an appointment date & time. )    Contact information:   [address] [place] [id number]       Discharge Medications:    Medication List    STOP taking these medications       ALEVE PO     aspirin 81 MG tablet      TAKE these medications       amLODipine 2.5 MG tablet  Commonly known as:  NORVASC  Take 1 tablet (2.5 mg total) by mouth daily.     apixaban 2.5 MG Tabs tablet  Commonly known as:  ELIQUIS  Take 1 tablet (2.5 mg total) by mouth 2 (two) times daily.     atorvastatin 40 MG tablet  Commonly known as:  LIPITOR  Take 1 tablet (40 mg total) by mouth daily at 6 PM.     clopidogrel 75 MG tablet  Commonly known as:  PLAVIX  Take 1 tablet (75 mg total) by mouth daily with breakfast.     levothyroxine 50 MCG tablet  Commonly known as:  SYNTHROID, LEVOTHROID  Take 1 tablet (50 mcg total) by mouth daily before  breakfast.     metoprolol tartrate 25 MG tablet  Commonly known as:  LOPRESSOR  Take 1 tablet (25 mg total) by mouth 2 (two) times daily.     MULTI-VITAMIN PO  Take by mouth.     nitroGLYCERIN 0.4 MG SL tablet  Commonly known as:  NITROSTAT  Place 1 tablet (0.4 mg total) under the tongue every 5 (five) minutes as needed for chest pain.     VITAMIN D PO  Take by mouth.     VITAMIN E PO  Take 1 tablet by mouth daily.       Outstanding Labs/Studies: LFTs, lipid panel in 6 weeks given statin initiation  Duration of Discharge Encounter: Greater than 30 minutes including physician time.  Signed, R. Valeria Batman, PA-C 10/15/2012, 4:38 PM

## 2012-10-15 NOTE — Progress Notes (Signed)
Discharge instructions given to patient and family re:  Medications, future appointments, and follow-up.  Also received instructions re:  CAD and prevention of future occurences.  Family and patients asked appropriate questions.  Teach-back method used.  Patient escorted to exit via volunteer and wheelchair  to go home with daughter to private home.  Pt. Discharged at 1438.

## 2012-10-15 NOTE — Progress Notes (Signed)
SUBJECTIVE:  No further chest pain.  No SOB.  BP is still elevated.    PHYSICAL EXAM Filed Vitals:   10/15/12 0500 10/15/12 0517 10/15/12 0822 10/15/12 0830  BP: 173/101 149/83  163/110  Pulse:  79 83 87  Temp:  97.3 F (36.3 C) 97.7 F (36.5 C)   TempSrc:  Oral Oral   Resp:  15 16   Height:      Weight:      SpO2:  97%     General:  No distress HEENT:  PERRL Lungs:  Clear Heart:  RRR Abdomen:  Positive bowel sounds, no rebound no guarding Extremities:  No edema Neuro:  Nonfocal Normal right radial pulse with no hematoma.   LABS: Lab Results  Component Value Date   TROPONINI 0.34* 10/13/2012   Results for orders placed during the hospital encounter of 10/12/12 (from the past 24 hour(s))  URINALYSIS, ROUTINE W REFLEX MICROSCOPIC     Status: Abnormal   Collection Time    10/14/12  8:10 PM      Result Value Range   Color, Urine YELLOW  YELLOW   APPearance HAZY (*) CLEAR   Specific Gravity, Urine 1.017  1.005 - 1.030   pH 5.5  5.0 - 8.0   Glucose, UA NEGATIVE  NEGATIVE mg/dL   Hgb urine dipstick TRACE (*) NEGATIVE   Bilirubin Urine NEGATIVE  NEGATIVE   Ketones, ur NEGATIVE  NEGATIVE mg/dL   Protein, ur NEGATIVE  NEGATIVE mg/dL   Urobilinogen, UA 0.2  0.0 - 1.0 mg/dL   Nitrite POSITIVE (*) NEGATIVE   Leukocytes, UA MODERATE (*) NEGATIVE  URINE MICROSCOPIC-ADD ON     Status: Abnormal   Collection Time    10/14/12  8:10 PM      Result Value Range   Squamous Epithelial / LPF MANY (*) RARE   WBC, UA 7-10  <3 WBC/hpf   RBC / HPF 0-2  <3 RBC/hpf   Bacteria, UA MANY (*) RARE  CBC     Status: Abnormal   Collection Time    10/15/12  5:33 AM      Result Value Range   WBC 5.0  4.0 - 10.5 K/uL   RBC 4.04  3.87 - 5.11 MIL/uL   Hemoglobin 12.2  12.0 - 15.0 g/dL   HCT 35.2 (*) 36.0 - 46.0 %   MCV 87.1  78.0 - 100.0 fL   MCH 30.2  26.0 - 34.0 pg   MCHC 34.7  30.0 - 36.0 g/dL   RDW 13.7  11.5 - 15.5 %   Platelets 191  150 - 400 K/uL  BASIC METABOLIC PANEL     Status:  Abnormal   Collection Time    10/15/12  5:33 AM      Result Value Range   Sodium 139  135 - 145 mEq/L   Potassium 4.1  3.5 - 5.1 mEq/L   Chloride 105  96 - 112 mEq/L   CO2 25  19 - 32 mEq/L   Glucose, Bld 102 (*) 70 - 99 mg/dL   BUN 15  6 - 23 mg/dL   Creatinine, Ser 1.06  0.50 - 1.10 mg/dL   Calcium 9.6  8.4 - 10.5 mg/dL   GFR calc non Af Amer 45 (*) >90 mL/min   GFR calc Af Amer 52 (*) >90 mL/min    Intake/Output Summary (Last 24 hours) at 10/15/12 1213 Last data filed at 10/15/12 0900  Gross per 24 hour  Intake 1776.74 ml  Output      0 ml  Net 1776.74 ml      ASSESSMENT AND PLAN:  1. Atrial fibrillation:  In NSR with PACs. Continue metoprolol 25 mg bid. Continue anticoagulation with Eliquis 25 mg bid.  2. Hypothyroidism: TSH depressed.  Reduce Synthroid.   3 . NSTEMI: cath showed severe 1 vessel CAD. S/P proximal RCA PCI with a BMS. Continue Plavix alone wihout Aspirin for 1 month then switch to Aspirin 81 mg daily.   4. Hypertension: uncontrolled. I added small dose Amlodipine.   D/C home today and follow up with Dr. Martinique.   Julie Lara 10/15/2012 12:13 PM

## 2012-10-15 NOTE — Progress Notes (Signed)
HR more regular, 55-65 but BP 198/83.  10 mg IV hydralazine given.  Pt denies complaints, rt radial level 0.

## 2012-10-16 LAB — URINE CULTURE: Colony Count: >=100000

## 2012-10-22 ENCOUNTER — Other Ambulatory Visit: Payer: Self-pay

## 2012-10-22 MED ORDER — NITROFURANTOIN MONOHYD MACRO 100 MG PO CAPS: ORAL_CAPSULE | ORAL | Status: DC

## 2012-10-27 ENCOUNTER — Emergency Department (HOSPITAL_COMMUNITY): Payer: Medicare Other

## 2012-10-27 ENCOUNTER — Emergency Department (HOSPITAL_COMMUNITY)
Admission: EM | Admit: 2012-10-27 | Discharge: 2012-10-27 | Disposition: A | Discharge status: Home / Self Care | Payer: Medicare Other | Attending: Emergency Medicine | Admitting: Emergency Medicine

## 2012-10-27 ENCOUNTER — Encounter (HOSPITAL_COMMUNITY): Payer: Self-pay | Admitting: Emergency Medicine

## 2012-10-27 DIAGNOSIS — R0789 Other chest pain: Secondary | ICD-10-CM | POA: Diagnosis not present

## 2012-10-27 DIAGNOSIS — R079 Chest pain, unspecified: Secondary | ICD-10-CM | POA: Diagnosis not present

## 2012-10-27 DIAGNOSIS — R072 Precordial pain: Secondary | ICD-10-CM | POA: Diagnosis not present

## 2012-10-27 DIAGNOSIS — Z9861 Coronary angioplasty status: Secondary | ICD-10-CM | POA: Insufficient documentation

## 2012-10-27 DIAGNOSIS — I2510 Atherosclerotic heart disease of native coronary artery without angina pectoris: Secondary | ICD-10-CM | POA: Diagnosis not present

## 2012-10-27 DIAGNOSIS — Z8639 Personal history of other endocrine, nutritional and metabolic disease: Secondary | ICD-10-CM | POA: Insufficient documentation

## 2012-10-27 DIAGNOSIS — Z7902 Long term (current) use of antithrombotics/antiplatelets: Secondary | ICD-10-CM | POA: Diagnosis not present

## 2012-10-27 DIAGNOSIS — I10 Essential (primary) hypertension: Secondary | ICD-10-CM | POA: Insufficient documentation

## 2012-10-27 DIAGNOSIS — Z8679 Personal history of other diseases of the circulatory system: Secondary | ICD-10-CM | POA: Diagnosis not present

## 2012-10-27 DIAGNOSIS — Z79899 Other long term (current) drug therapy: Secondary | ICD-10-CM | POA: Diagnosis not present

## 2012-10-27 DIAGNOSIS — Z862 Personal history of diseases of the blood and blood-forming organs and certain disorders involving the immune mechanism: Secondary | ICD-10-CM | POA: Diagnosis not present

## 2012-10-27 HISTORY — DX: Non-ST elevation (NSTEMI) myocardial infarction: I21.4

## 2012-10-27 LAB — BASIC METABOLIC PANEL
BUN: 26 mg/dL — ABNORMAL HIGH (ref 6–23)
Creatinine, Ser: 1.23 mg/dL — ABNORMAL HIGH (ref 0.50–1.10)
GFR calc Af Amer: 43 mL/min — ABNORMAL LOW (ref >90)
GFR calc non Af Amer: 37 mL/min — ABNORMAL LOW (ref >90)
Glucose, Bld: 97 mg/dL (ref 70–99)
Potassium: 3.9 mEq/L (ref 3.5–5.1)

## 2012-10-27 LAB — CBC WITH DIFFERENTIAL/PLATELET
Basophils Relative: 0 % (ref 0–1)
Eosinophils Absolute: 0.3 10*3/uL (ref 0.0–0.7)
Eosinophils Relative: 5 % (ref 0–5)
HCT: 35 % — ABNORMAL LOW (ref 36.0–46.0)
Hemoglobin: 11.7 g/dL — ABNORMAL LOW (ref 12.0–15.0)
Lymphs Abs: 1.5 10*3/uL (ref 0.7–4.0)
MCH: 29.5 pg (ref 26.0–34.0)
MCHC: 33.4 g/dL (ref 30.0–36.0)
MCV: 88.2 fL (ref 78.0–100.0)
Monocytes Absolute: 0.5 10*3/uL (ref 0.1–1.0)
Monocytes Relative: 8 % (ref 3–12)
Neutrophils Relative %: 66 % (ref 43–77)
RBC: 3.97 MIL/uL (ref 3.87–5.11)

## 2012-10-27 NOTE — ED Notes (Signed)
Patient transported to X-ray 

## 2012-10-27 NOTE — ED Provider Notes (Addendum)
CSN: [id number]     Arrival date & time 10/27/12  [id number] History     First MD Initiated Contact with Patient 10/27/12 [phone]     No chief complaint on file.  (Consider location/radiation/quality/duration/timing/severity/associated sxs/prior Treatment) Patient is a 79 y.o. female presenting with chest pain. The history is provided by the patient.  Chest Pain Pain location:  L chest and substernal area Pain quality: pressure and tightness   Pain radiates to:  L shoulder and neck Pain radiates to the back: no   Pain severity:  Moderate Onset quality:  Gradual Duration:  1 hour Timing:  Constant Progression:  Resolved Chronicity:  New Context comment:  States it started when she was getting out of bed Relieved by:  Aspirin Worsened by:  Nothing tried Ineffective treatments:  None tried Associated symptoms: no abdominal pain, no cough, no diaphoresis, no dizziness, no fever, no lower extremity edema, no nausea, no shortness of breath, no syncope, not vomiting and no weakness   Risk factors: coronary artery disease and hypertension   Risk factors comment:  Recent coronary stent placed 2 weeks ago   Past Medical History  Diagnosis Date  . 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 History of thyroidectomy   . Dilated cardiomyopathy   . Aortic insufficiency     mild to moderate  . PAC (premature atrial contraction)     Chronic  . Hypothyroidism   . Idiopathic cardiomyopathy 1999    Hx with last EF in 2012 was 50-55%  . Goiter   . CAD (coronary artery disease)     95% prox RCA s/p BMS   Past Surgical History  Procedure Laterality Date  . Cardiac catheterization    . Thyroidectomy    . Total vaginal hysterectomy      Complete  . Coronary angioplasty with stent placement      BMS-95% prox RCA   No family history on file. History  Substance Use Topics  . Smoking status: Never Smoker   . Smokeless tobacco: Never Used  . Alcohol Use: No   OB History   Grav Para Term Preterm Abortions TAB SAB Ect Mult Living                 Review of Systems  Constitutional: Negative for fever and diaphoresis.  Respiratory: Negative for cough and shortness of breath.   Cardiovascular: Positive for chest pain. Negative for syncope.  Gastrointestinal: Negative for nausea, vomiting and abdominal pain.  Neurological: Negative for dizziness and weakness.  All other systems reviewed and are negative.    Allergies  Review of patient's allergies indicates no known allergies.  Home Medications   Current Outpatient Rx  Name  Route  Sig  Dispense  Refill  . amLODipine (NORVASC) 2.5 MG tablet   Oral   Take 1 tablet (2.5 mg total) by mouth daily.   30 tablet   3   . apixaban (ELIQUIS) 2.5 MG TABS tablet   Oral   Take 1 tablet (2.5 mg total) by mouth 2 (two) times daily.   60 tablet   3   . atorvastatin (LIPITOR) 40 MG tablet   Oral   Take 1 tablet (40 mg total) by mouth daily at 6 PM.   30 tablet   3   . Cholecalciferol (VITAMIN D PO)   Oral   Take by mouth.           . clopidogrel (PLAVIX) 75 MG tablet  Oral   Take 1 tablet (75 mg total) by mouth daily with breakfast.   30 tablet   0   . levothyroxine (SYNTHROID, LEVOTHROID) 50 MCG tablet   Oral   Take 1 tablet (50 mcg total) by mouth daily before breakfast.   30 tablet   3   . metoprolol tartrate (LOPRESSOR) 25 MG tablet   Oral   Take 1 tablet (25 mg total) by mouth 2 (two) times daily.   60 tablet   3   . Multiple Vitamin (MULTI-VITAMIN PO)   Oral   Take by mouth.           . nitrofurantoin, macrocrystal-monohydrate, (MACROBID) 100 MG capsule      Take 100 mg twice a day for 7 days.   14 capsule   0   . nitroGLYCERIN (NITROSTAT) 0.4 MG SL tablet   Sublingual   Place 1 tablet (0.4 mg total) under the tongue every 5 (five) minutes as needed for chest pain.   25 tablet   3   . VITAMIN E PO   Oral   Take 1 tablet by mouth daily.          BP 137/57  Pulse  76  Temp(Src) 97.6 F (36.4 C) (Oral)  Resp 16  SpO2 95% Physical Exam  Nursing note and vitals reviewed. Constitutional: She is oriented to person, place, and time. She appears well-developed and well-nourished. No distress.  HENT:  Head: Normocephalic and atraumatic.  Eyes: EOM are normal. Pupils are equal, round, and reactive to light.  Cardiovascular: Normal rate, normal heart sounds and intact distal pulses.  An irregular rhythm present. Exam reveals no friction rub.   No murmur heard. Pulmonary/Chest: Effort normal and breath sounds normal. She has no wheezes. She has no rales.  Abdominal: Soft. Bowel sounds are normal. She exhibits no distension. There is no tenderness. There is no rebound and no guarding.  Musculoskeletal: Normal range of motion. She exhibits no tenderness.  No edema  Neurological: She is alert and oriented to person, place, and time. No cranial nerve deficit.  Skin: Skin is warm and dry. No rash noted.  Psychiatric: She has a normal mood and affect. Her behavior is normal.    ED Course   Procedures (including critical care time)   Date: 10/27/2012  Rate: 88  Rhythm: normal sinus rhythm and premature atrial contractions (PAC)  QRS Axis: normal  Intervals: normal  ST/T Wave abnormalities: nonspecific ST/T changes  Conduction Disutrbances:left anterior fascicular block  Narrative Interpretation:   Old EKG Reviewed: unchanged   Labs Reviewed  CBC WITH DIFFERENTIAL - Abnormal; Notable for the following:    Hemoglobin 11.7 (*)    HCT 35.0 (*)    All other components within normal limits  BASIC METABOLIC PANEL - Abnormal; Notable for the following:    BUN 26 (*)    Creatinine, Ser 1.23 (*)    GFR calc non Af Amer 37 (*)    GFR calc Af Amer 43 (*)    All other components within normal limits  POCT I-STAT TROPONIN I   Dg Chest 2 View  10/27/2012   *RADIOLOGY REPORT*  Clinical Data: Chest heaviness and pain extending into left shoulder for 1 day   CHEST - 2 VIEW  Comparison: 10/12/2012  Findings: Pain mild cardiac silhouette enlargement stable. Uncoiling of the aorta is stable.  Vascular pattern normal.  Lungs clear.  No pleural effusions.  IMPRESSION: Stable mild cardiomegaly.  No acute findings.  Original Report Authenticated By: Skipper Cliche, M.D.   1. Chest pain     MDM   Patient with concerning history of chest pain this morning in her left chest that radiated to the back of her left neck and her shoulder. The pain resolved after taking 2 baby aspirin however patient has a significant history of recent stent placement 2 weeks ago and is currently on elaquis and Plavix.  Patient states the pain lasted about one hour and has now resolved completely. She is currently symptom-free and is asymptomatic with a normal exam. Symptoms are not suggestive of PE, dissection and no associated symptoms with her pain.  EKG is unchanged with sinus rhythm and PACs but no sign of atrial fibrillation today.  Chest x-ray, CBC, BMP, troponin pending  10:37 AM Initial labs and x-ray within normal limits. Spoke with cardiology who will come and evaluate the patient  1:47 PM Dr. Verl Blalock saw the pt and felt she was safe for d/c and f/u with cardiology.   Blanchie Dessert, MD 10/27/12 [place], MD 10/27/12 1347

## 2012-10-27 NOTE — ED Notes (Addendum)
PT took home morning meds: Lipitor, metoprolol, and norvasc per ED MD. Plavix and Equis held.

## 2012-10-27 NOTE — Consult Note (Signed)
History and Physical   Patient ID: Julie Lara MRN: [id number], DOB/AGE: April 20, 1920 79 y.o. Date of Encounter: 10/27/2012  Primary Physician: Geoffery Lyons, MD Primary Cardiologist: PJ  Chief Complaint:  Chest pain  HPI: Julie Lara is a 79 y.o. female with a history of CAD. She came in with a non-STEMI and received a bare-metal stent to the RCA. She was discharged on 10/15/2012. Since discharge, she has done very well. She has been increasing her activity without chest pain and had no ischemic symptoms.  At 8 AM today, she woke with some chest discomfort she describes as a pressure. It was located in the left side of her chest, starting just below the edge of her ribs and going up to the upper part of her chest. It then went into her left shoulder. She is not able to rate it very well but it is not the same kind of pain nor is it as bad as the pain she had with her MI. She may have been a little short of breath but she is not clear on this. She denies nausea, vomiting or diaphoresis. When the symptoms did not resolve, she called her son who told her to take aspirin 81 mg x2. The pain resolved. She was concerned about the pain and came to the emergency room. In the emergency room, she is resting comfortably and her ECG is not acute. She has never had this pain before. This is the first time she has had any discomfort since being discharged from the hospital.  Past Medical History  Diagnosis Date  . 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 History of thyroidectomy   . Dilated cardiomyopathy   . Aortic insufficiency     mild to moderate  . PAC (premature atrial contraction)     Chronic  . Hypothyroidism   . Idiopathic cardiomyopathy 1999    Hx with last EF in 2012 was 50-55%  . Goiter   . NSTEMI (non-ST elevated myocardial infarction) July 2014    95% prox RCA s/p BMS    Surgical History:  Past Surgical History  Procedure Laterality Date  . Cardiac  catheterization    . Thyroidectomy    . Total vaginal hysterectomy      Complete  . Coronary angioplasty with stent placement      BMS-95% prox RCA     I have reviewed the patient's current medications. Prior to Admission medications   Medication Sig Start Date End Date Taking? Authorizing Provider  amLODipine (NORVASC) 2.5 MG tablet Take 1 tablet (2.5 mg total) by mouth daily. 10/15/12  Yes Roger A Arguello, PA-C  apixaban (ELIQUIS) 2.5 MG TABS tablet Take 1 tablet (2.5 mg total) by mouth 2 (two) times daily. 10/15/12  Yes Roger A Arguello, PA-C  aspirin 81 MG tablet Take 81 mg by mouth daily as needed for pain.   Yes Historical Provider, MD  atorvastatin (LIPITOR) 40 MG tablet Take 1 tablet (40 mg total) by mouth daily at 6 PM. 10/15/12  Yes Roger A Arguello, PA-C  Cholecalciferol (VITAMIN D PO) Take by mouth.     Yes Historical Provider, MD  clopidogrel (PLAVIX) 75 MG tablet Take 1 tablet (75 mg total) by mouth daily with breakfast. 10/15/12 11/14/12 Yes Roger A Arguello, PA-C  levothyroxine (SYNTHROID, LEVOTHROID) 50 MCG tablet Take 1 tablet (50 mcg total) by mouth daily before breakfast. 10/15/12  Yes Roger A Arguello, PA-C  metoprolol tartrate (LOPRESSOR) 25 MG tablet  Take 1 tablet (25 mg total) by mouth 2 (two) times daily. 10/15/12  Yes Roger A Arguello, PA-C  nitrofurantoin, macrocrystal-monohydrate, (MACROBID) 100 MG capsule Take 100 mg twice a day for 7 days. 10/22/12  Yes Peter M Martinique, MD  nitroGLYCERIN (NITROSTAT) 0.4 MG SL tablet Place 1 tablet (0.4 mg total) under the tongue every 5 (five) minutes as needed for chest pain. 10/15/12   Roger A Arguello, PA-C   Scheduled Meds: Continuous Infusions: PRN Meds:.    Allergies: No Known Allergies  History   Social History  . Marital Status: Widowed    Spouse Name: N/A    Number of Children: N/A  . Years of Education: N/A   Occupational History  . Retired Astronomer [employer]   Social History Main Topics  . Smoking status: Never  Smoker   . Smokeless tobacco: Never Used  . Alcohol Use: No  . Drug Use: No  . Sexually Active: Not on file   Other Topics Concern  . Not on file   Social History Narrative   Pt lives alone, but has family close by.    Family Status  Relation Status Death Age  . Mother Deceased 10    Unknown Causes  . Father Deceased 74    Intestinal Cancer  . Sister Deceased 24    Died with complications of diabetes  . Brother Deceased 6    [place]  . Brother Deceased 54    Died in a crane accident    Review of Systems:   Full 14-point review of systems otherwise negative except as noted above.  Physical Exam: Blood pressure 156/61, pulse 51, temperature 97.6 F (36.4 C), temperature source Oral, resp. rate 18, SpO2 100.00%. General: Well developed, well nourished, elderly,female in no acute distress. Head: Normocephalic, atraumatic, sclera non-icteric, no xanthomas, nares are without discharge. Dentition: Dentures Neck: No carotid bruits. JVD not elevated. No thyromegally Lungs: Good expansion bilaterally. without wheezes or rhonchi. Bibasilar Rales  Heart: Slightly IRRegular rate and rhythm with S1 S2.  No S3 or S4.  No murmur, no rubs, or gallops appreciated. Abdomen: Soft, non-tender, non-distended with normoactive bowel sounds. No hepatomegaly. No rebound/guarding. No obvious abdominal masses. Msk:  Strength and tone appear normal for age. No joint deformities or effusions, no spine or costo-vertebral angle tenderness. Extremities: No clubbing or cyanosis. No edema.  Distal pedal pulses are 2+ in 4 extrem Neuro: Alert and oriented X 3. Moves all extremities spontaneously. No focal deficits noted. Psych:  Responds to questions appropriately with a normal affect. Skin: No rashes or lesions noted  Labs:   Lab Results  Component Value Date   WBC 6.9 10/27/2012   HGB 11.7* 10/27/2012   HCT 35.0* 10/27/2012   MCV 88.2 10/27/2012   PLT 207 10/27/2012     Recent Labs Lab  10/27/12 0935  NA 138  K 3.9  CL 103  CO2 26  BUN 26*  CREATININE 1.23*  CALCIUM 9.3  GLUCOSE 97    Recent Labs  10/27/12 0940  TROPIPOC 0.01   Radiology/Studies: Dg Chest 2 View 10/27/2012   *RADIOLOGY REPORT*  Clinical Data: Chest heaviness and pain extending into left shoulder for 1 day  CHEST - 2 VIEW  Comparison: 10/12/2012  Findings: Pain mild cardiac silhouette enlargement stable. Uncoiling of the aorta is stable.  Vascular pattern normal.  Lungs clear.  No pleural effusions.  IMPRESSION: Stable mild cardiomegaly.  No acute findings.   Original Report Authenticated By: Skipper Cliche, M.D.  Cardiac Cath: 10/14/2012 LAD 50/40, D1 20, circumflex okay, RCA 95-0 with 3.0 x 15 vision bare metal stent  ECHO:  10/14/2014 EF 40%  ECG:  10/27/2012 Vent. rate 88 BPM PR interval 150 ms QRS duration 100 ms QT/QTc 394/476 ms P-R-T axes 46 -59 218  ASSESSMENT AND PLAN:  Principal Problem:   Resting chest pain - her symptoms are atypical and dissimilar to her MI symptoms. They resolved after taking aspirin. She has been walking per cardiac rehabilitation guidelines since discharge after her MI, without any problems. Her ECG is not acute and her cardiac enzymes are negative. Dr. Verl Blalock evaluated Ms. Weech and discussed the situation with the patient and her family.  Her symptoms are not likely cardiac in origin. She has no ECG changes indicating acute stent closure and no other critical disease was seen at recent cath. Dr. Verl Blalock reviewed with the family indications for nitroglycerin and instructions on its use. They are encouraged to call cardiology if she has any more chest pain, but understand that her symptoms today are not felt to be cardiac in origin.  No further inpatient workup is indicated at this time. She may be discharged home and is to keep all followup appointments.  SignedRosaria Ferries, PA-C 10/27/2012 12:33 PM Beeper [phone]    I have taken a history, reviewed  medications, allergies, PMH, SH, FH, and reviewed ROS and examined the patient.  I agree with the assessment and plan. Instructed patient and family on taking NTG in the future. OK to go home.  Devaney Segers C. Verl Blalock, MD, [place] Pager:  [phone]

## 2012-10-27 NOTE — ED Notes (Signed)
Family at bedside. 

## 2012-10-27 NOTE — ED Notes (Signed)
Recent stent placement 2 weeks ago. Just woke up with heavy feeling in chest; SOB; pt took 2 81 mg aspirins. Pain went away when she was placed in the room in ED. A&Ox3.

## 2012-11-04 ENCOUNTER — Ambulatory Visit (INDEPENDENT_AMBULATORY_CARE_PROVIDER_SITE_OTHER): Payer: Medicare Other | Admitting: Nurse Practitioner

## 2012-11-04 ENCOUNTER — Encounter: Payer: Self-pay | Admitting: Nurse Practitioner

## 2012-11-04 VITALS — BP 120/70 | HR 52 | Ht 64.0 in | Wt 136.8 lb

## 2012-11-04 DIAGNOSIS — I420 Dilated cardiomyopathy: Secondary | ICD-10-CM

## 2012-11-04 DIAGNOSIS — I259 Chronic ischemic heart disease, unspecified: Secondary | ICD-10-CM | POA: Diagnosis not present

## 2012-11-04 DIAGNOSIS — I428 Other cardiomyopathies: Secondary | ICD-10-CM | POA: Diagnosis not present

## 2012-11-04 DIAGNOSIS — I4891 Unspecified atrial fibrillation: Secondary | ICD-10-CM | POA: Diagnosis not present

## 2012-11-04 LAB — CBC WITH DIFFERENTIAL/PLATELET
Basophils Absolute: 0 10*3/uL (ref 0.0–0.1)
Basophils Relative: 0.7 % (ref 0.0–3.0)
Eosinophils Absolute: 0.3 10*3/uL (ref 0.0–0.7)
Eosinophils Relative: 4.9 % (ref 0.0–5.0)
HCT: 32 % — ABNORMAL LOW (ref 36.0–46.0)
Hemoglobin: 10.8 g/dL — ABNORMAL LOW (ref 12.0–15.0)
Lymphocytes Relative: 20.3 % (ref 12.0–46.0)
Lymphs Abs: 1.2 10*3/uL (ref 0.7–4.0)
MCHC: 33.8 g/dL (ref 30.0–36.0)
MCV: 88.5 fl (ref 78.0–100.0)
Monocytes Absolute: 0.5 10*3/uL (ref 0.1–1.0)
Monocytes Relative: 9.3 % (ref 3.0–12.0)
Neutro Abs: 3.8 10*3/uL (ref 1.4–7.7)
Neutrophils Relative %: 64.8 % (ref 43.0–77.0)
Platelets: 238 10*3/uL (ref 150.0–400.0)
RBC: 3.61 Mil/uL — ABNORMAL LOW (ref 3.87–5.11)
RDW: 13.6 % (ref 11.5–14.6)
WBC: 5.9 10*3/uL (ref 4.5–10.5)

## 2012-11-04 NOTE — Patient Instructions (Addendum)
Stay on your current medicines  When you finish with this bottle of Plavix - do not refill - put a baby aspirin in its place  Be as active as you can  We will check labs today  See Dr. Martinique next month as planned with fasting labs  Call the [facility] office at [phone] if you have any questions, problems or concerns.

## 2012-11-04 NOTE — Progress Notes (Signed)
Julie Lara Date of Birth: May 09, 1920 Medical Record [id number]  History of Present Illness: Ms. Heidelberger is seen back today for a post hospital visit. Seen for Dr. Martinique. She is a 79 year old female with a history of PAF, dilated Cm, AI, PACs, hypothyroidism, CKD state III and prior UTIs.  Most recently in the hospital with atrial fib with RVR. She came to the ER with chest pain and weakness. Treated with IV diltiazem and also placed on Eliquis. TSH was depressed and her Synthroid was reduced. Troponins were mildly elevated and she was cathed and subsequently had BMS to the RCA. Echo showed EF of 40%, moderate LV dilatation, mild LVH, mild MR, mild biatrial enlargement and changes questionable for IVC thrombus. Plan was determined to proceed with Plavix for one month without aspirin, then stop the Plavix and use low dose aspirin with concomitant Eliquis. Beta blocker and statin therapy were started as well.   Comes back today. Here with her daughter. Doing ok. Walking daily. No chest pain. No awareness of her rhythm. Tolerating her medicines well. No bruising/bleeding. Not short of breath. Getting stronger overall. Does have some sleep issues but this was present prior to this last hospitalization.    Current Outpatient Prescriptions  Medication Sig Dispense Refill  . amLODipine (NORVASC) 2.5 MG tablet Take 1 tablet (2.5 mg total) by mouth daily.  30 tablet  3  . apixaban (ELIQUIS) 2.5 MG TABS tablet Take 1 tablet (2.5 mg total) by mouth 2 (two) times daily.  60 tablet  3  . atorvastatin (LIPITOR) 40 MG tablet Take 1 tablet (40 mg total) by mouth daily at 6 PM.  30 tablet  3  . Cholecalciferol (VITAMIN D PO) Take 1,000 Units by mouth daily.       . clopidogrel (PLAVIX) 75 MG tablet Take 1 tablet (75 mg total) by mouth daily with breakfast.  30 tablet  0  . levothyroxine (SYNTHROID, LEVOTHROID) 50 MCG tablet Take 1 tablet (50 mcg total) by mouth daily before breakfast.  30 tablet  3  .  metoprolol tartrate (LOPRESSOR) 25 MG tablet Take 1 tablet (25 mg total) by mouth 2 (two) times daily.  60 tablet  3  . nitroGLYCERIN (NITROSTAT) 0.4 MG SL tablet Place 1 tablet (0.4 mg total) under the tongue every 5 (five) minutes as needed for chest pain.  25 tablet  3   No current facility-administered medications for this visit.    No Known Allergies  Past Medical History  Diagnosis Date  . 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 History of thyroidectomy   . Dilated cardiomyopathy   . Aortic insufficiency     mild to moderate  . PAC (premature atrial contraction)     Chronic  . Hypothyroidism   . Idiopathic cardiomyopathy 1999    Hx with last EF in 2012 was 50-55%  . Goiter   . NSTEMI (non-ST elevated myocardial infarction) July 2014    95% prox RCA s/p BMS    Past Surgical History  Procedure Laterality Date  . Cardiac catheterization    . Thyroidectomy    . Total vaginal hysterectomy      Complete  . Coronary angioplasty with stent placement      BMS-95% prox RCA    History  Smoking status  . Never Smoker   Smokeless tobacco  . Never Used    History  Alcohol Use No    History reviewed. No pertinent family  history.  Review of Systems: The review of systems is per the HPI.  All other systems were reviewed and are negative.  Physical Exam: BP 120/70  Pulse 52  Ht 5\' 4"  (1.626 m)  Wt 136 lb 12.8 oz (62.052 kg)  BMI 23.47 kg/m2 Patient is very pleasant and in no acute distress. Looks younger than her stated age. Skin is warm and dry. Color is normal.  HEENT is unremarkable. Normocephalic/atraumatic. PERRL. Sclera are nonicteric. Neck is supple. No masses. No JVD. Lungs are clear. Cardiac exam shows an irregular rhythm. Her rate is ok. Abdomen is soft. Extremities are without edema. Gait and ROM are intact. No gross neurologic deficits noted.  LABORATORY DATA: CBC and BMET pending.  Lab Results  Component Value Date   WBC 6.9  10/27/2012   HGB 11.7* 10/27/2012   HCT 35.0* 10/27/2012   PLT 207 10/27/2012   GLUCOSE 97 10/27/2012   ALT 10 10/12/2012   AST 15 10/12/2012   NA 138 10/27/2012   K 3.9 10/27/2012   CL 103 10/27/2012   CREATININE 1.23* 10/27/2012   BUN 26* 10/27/2012   CO2 26 10/27/2012   TSH 0.155* 10/12/2012   INR 1.16 10/13/2012   Echo Study Conclusions  - Left ventricle: The cavity size was moderately dilated. Wall thickness was increased in a pattern of mild LVH. The estimated ejection fraction was 40%. Diffuse hypokinesis. - Aortic valve: Cannot tell if valve is tri leaflet or not Mild regurgitation. - Mitral valve: Mild regurgitation. - Left atrium: The atrium was mildly dilated. - Right atrium: The atrium was mildly dilated. - Atrial septum: No defect or patent foramen ovale was identified. - Impressions: Cannot r/o thrombus in IVC on limited subcostal window  Coronary angiography:   Left Main: Normal  Left Anterior Descending (LAD): Normal in size with moderate calcifications. There is a 50% ostial stenosis followed by diffuse 40% disease proximally. There is a 40% discrete stenosis in the midsegment. The rest of the vessel has minor irregularities.  1st diagonal (D1): Normal in size with 20% ostial stenosis  2nd diagonal (D2): Normal in size with minor irregularities.  3rd diagonal (D3): Very small in size.  Circumflex (LCx): Normal in size and codominant. The vessel has minor irregularities with no evidence of obstructive disease.  1st obtuse marginal: Very small in size with no significant disease.  2nd obtuse marginal: Large in size with minor irregularities.  3rd obtuse marginal: Small in size with no significant disease.  AV groove continuation segment: Large in size with no significant disease. It gives 2 posterolateral branches which are free of significant disease.  Right Coronary Artery: Normal in size and codominant. There is a discrete 95% stenosis proximally. The rest of the vessel  has no significant disease.  Posterior descending artery: Normal in size with no significant disease.  Left ventriculography: Was not performed.   Final Conclusions:  1. Severe one-vessel coronary artery disease.  2. Successful angioplasty and bare-metal stent placement to the proximal right coronary artery.  3. Elevated BP with mildly elevated LVEDP.   Recommendations:  I will resume Eliquis tomorrow morning. I recommend treatment with Plavix without aspirin for one month. After one month, Plavix can be switched to aspirin 81 mg once daily to be continued with anticoagulation. I will start small dose metoprolol 25 mg twice daily and atorvastatin.  [name] MD, [facility]  10/14/2012, 8:54 AM        Assessment / Plan: 1. CAD - with recent NSTEMI  per troponins - s/p cath with PCI with BMS to the RCA - EF of 40% per her echo - she is doing well clinically. I have left her on her current regimen. She will finish out her current bottle of Plavix and then not refill. Once finished she will replace this with a baby aspirin.   2. Atrial fib - rate is well controlled. She is on Eliquis. Needs recheck BMET/CBC today. She does not appear very symptomatic to me.   3. Hypothyroidism - has had her dose of Synthroid cut back due to depressed TSH level - to recheck by PCP.   4. HLD - now on statin therapy. Will need fasting labs on return.    Patient is agreeable to this plan and will call if any problems develop in the interim.   Burtis Junes, RN, ANP-C [place] [address] [place], [place]  [id number]

## 2012-11-05 ENCOUNTER — Other Ambulatory Visit: Payer: Self-pay | Admitting: *Deleted

## 2012-11-05 DIAGNOSIS — D649 Anemia, unspecified: Secondary | ICD-10-CM

## 2012-11-05 LAB — BASIC METABOLIC PANEL
BUN: 28 mg/dL — ABNORMAL HIGH (ref 6–23)
CO2: 27 mEq/L (ref 19–32)
Calcium: 9.2 mg/dL (ref 8.4–10.5)
Chloride: 105 mEq/L (ref 96–112)
Creatinine, Ser: 1.4 mg/dL — ABNORMAL HIGH (ref 0.4–1.2)
GFR: 38.47 mL/min — ABNORMAL LOW (ref >60.00)
Glucose, Bld: 82 mg/dL (ref 70–99)
Potassium: 4.4 mEq/L (ref 3.5–5.1)
Sodium: 138 mEq/L (ref 135–145)

## 2012-11-06 ENCOUNTER — Telehealth: Payer: Self-pay | Admitting: Cardiology

## 2012-11-06 NOTE — Telephone Encounter (Signed)
New Prob     Pts daughter calling for clarification on her medications. Please call.

## 2012-11-07 NOTE — Telephone Encounter (Signed)
Returned call to patient's daughter [name] she wanted to make sure mother is taking plavix 75 mg daily and eliquis 2.5 mg twice a day.After 1 month of plavix she is to stop and start  Aspirin 81 mg daily along with eliquis 2.5 mg twice a day.Daughter was told that is correct.

## 2012-11-10 ENCOUNTER — Other Ambulatory Visit: Payer: Self-pay | Admitting: *Deleted

## 2012-11-10 DIAGNOSIS — H179 Unspecified corneal scar and opacity: Secondary | ICD-10-CM | POA: Diagnosis not present

## 2012-11-10 DIAGNOSIS — H02059 Trichiasis without entropian unspecified eye, unspecified eyelid: Secondary | ICD-10-CM | POA: Diagnosis not present

## 2012-11-10 DIAGNOSIS — Z961 Presence of intraocular lens: Secondary | ICD-10-CM | POA: Diagnosis not present

## 2012-11-10 DIAGNOSIS — H52209 Unspecified astigmatism, unspecified eye: Secondary | ICD-10-CM | POA: Diagnosis not present

## 2012-11-10 MED ORDER — AMLODIPINE BESYLATE 2.5 MG PO TABS: 2.5000 mg | ORAL_TABLET | Freq: Every day | ORAL | Status: DC

## 2012-11-10 MED ORDER — METOPROLOL TARTRATE 25 MG PO TABS: 25.0000 mg | ORAL_TABLET | Freq: Two times a day (BID) | ORAL | Status: DC

## 2012-11-10 MED ORDER — APIXABAN 2.5 MG PO TABS: 2.5000 mg | ORAL_TABLET | Freq: Two times a day (BID) | ORAL | Status: DC

## 2012-11-10 MED ORDER — ATORVASTATIN CALCIUM 40 MG PO TABS: 40.0000 mg | ORAL_TABLET | Freq: Every day | ORAL | Status: DC

## 2012-11-13 ENCOUNTER — Encounter (HOSPITAL_COMMUNITY)
Admission: RE | Admit: 2012-11-13 | Discharge: 2012-11-13 | Disposition: A | Discharge status: Home / Self Care | Payer: Medicare Other | Source: Ambulatory Visit | Attending: Cardiology | Admitting: Cardiology

## 2012-11-13 ENCOUNTER — Telehealth: Payer: Self-pay | Admitting: Cardiology

## 2012-11-13 DIAGNOSIS — I4891 Unspecified atrial fibrillation: Secondary | ICD-10-CM | POA: Insufficient documentation

## 2012-11-13 DIAGNOSIS — IMO0002 Reserved for concepts with insufficient information to code with codable children: Secondary | ICD-10-CM | POA: Diagnosis not present

## 2012-11-13 DIAGNOSIS — I428 Other cardiomyopathies: Secondary | ICD-10-CM | POA: Insufficient documentation

## 2012-11-13 DIAGNOSIS — I10 Essential (primary) hypertension: Secondary | ICD-10-CM | POA: Diagnosis not present

## 2012-11-13 DIAGNOSIS — I359 Nonrheumatic aortic valve disorder, unspecified: Secondary | ICD-10-CM | POA: Insufficient documentation

## 2012-11-13 DIAGNOSIS — E785 Hyperlipidemia, unspecified: Secondary | ICD-10-CM | POA: Diagnosis not present

## 2012-11-13 DIAGNOSIS — Z5189 Encounter for other specified aftercare: Secondary | ICD-10-CM | POA: Insufficient documentation

## 2012-11-13 DIAGNOSIS — Z9861 Coronary angioplasty status: Secondary | ICD-10-CM | POA: Insufficient documentation

## 2012-11-13 DIAGNOSIS — E038 Other specified hypothyroidism: Secondary | ICD-10-CM | POA: Diagnosis not present

## 2012-11-13 MED ORDER — ATORVASTATIN CALCIUM 40 MG PO TABS: 40.0000 mg | ORAL_TABLET | Freq: Every day | ORAL | Status: DC

## 2012-11-13 MED ORDER — AMLODIPINE BESYLATE 2.5 MG PO TABS: 2.5000 mg | ORAL_TABLET | Freq: Every day | ORAL | Status: DC

## 2012-11-13 MED ORDER — METOPROLOL TARTRATE 25 MG PO TABS: 25.0000 mg | ORAL_TABLET | Freq: Two times a day (BID) | ORAL | Status: DC

## 2012-11-13 MED ORDER — APIXABAN 2.5 MG PO TABS: 2.5000 mg | ORAL_TABLET | Freq: Two times a day (BID) | ORAL | Status: DC

## 2012-11-13 NOTE — Telephone Encounter (Signed)
Follow Up     Following up on new prescription requests that daughter called about Monday.

## 2012-11-13 NOTE — Progress Notes (Signed)
Cardiac Rehab Medication Review by a Pharmacist  Does the patient  feel that his/her medications are working for him/her?  yes  Has the patient been experiencing any side effects to the medications prescribed?  no  Does the patient measure his/her own blood pressure or blood glucose at home?  no   Does the patient have any problems obtaining medications due to transportation or finances?   no  Understanding of regimen: good Understanding of indications: good Potential of compliance: good    Pharmacist comments: Patient has a good understanding of her medications. She had a family member with her today, and she reports having another family member that is a Marine scientist.    [name] A 11/13/2012 9:09 AM

## 2012-11-13 NOTE — Telephone Encounter (Signed)
Returned call to patient's daughter [name] she stated mother's medication has not came in from her mail order pharmacy and she will be out of all her medications tomorrow.30 day refills sent to [facility].

## 2012-11-17 ENCOUNTER — Encounter (HOSPITAL_COMMUNITY): Payer: Medicare Other

## 2012-11-17 DIAGNOSIS — C44319 Basal cell carcinoma of skin of other parts of face: Secondary | ICD-10-CM | POA: Diagnosis not present

## 2012-11-19 ENCOUNTER — Encounter (HOSPITAL_COMMUNITY)
Admission: RE | Admit: 2012-11-19 | Discharge: 2012-11-19 | Disposition: A | Discharge status: Home / Self Care | Payer: Medicare Other | Source: Ambulatory Visit | Attending: Cardiology | Admitting: Cardiology

## 2012-11-19 ENCOUNTER — Other Ambulatory Visit (INDEPENDENT_AMBULATORY_CARE_PROVIDER_SITE_OTHER): Payer: Medicare Other

## 2012-11-19 ENCOUNTER — Encounter (HOSPITAL_COMMUNITY): Payer: Medicare Other

## 2012-11-19 DIAGNOSIS — Z9861 Coronary angioplasty status: Secondary | ICD-10-CM | POA: Diagnosis not present

## 2012-11-19 DIAGNOSIS — D649 Anemia, unspecified: Secondary | ICD-10-CM | POA: Diagnosis not present

## 2012-11-19 DIAGNOSIS — I359 Nonrheumatic aortic valve disorder, unspecified: Secondary | ICD-10-CM | POA: Diagnosis not present

## 2012-11-19 DIAGNOSIS — I4891 Unspecified atrial fibrillation: Secondary | ICD-10-CM | POA: Diagnosis not present

## 2012-11-19 DIAGNOSIS — Z5189 Encounter for other specified aftercare: Secondary | ICD-10-CM | POA: Diagnosis not present

## 2012-11-19 DIAGNOSIS — I428 Other cardiomyopathies: Secondary | ICD-10-CM | POA: Diagnosis not present

## 2012-11-19 LAB — CBC WITH DIFFERENTIAL/PLATELET
Basophils Relative: 0.6 % (ref 0.0–3.0)
Eosinophils Absolute: 0.2 10*3/uL (ref 0.0–0.7)
Eosinophils Relative: 4.2 % (ref 0.0–5.0)
Lymphocytes Relative: 27.6 % (ref 12.0–46.0)
Monocytes Relative: 9.9 % (ref 3.0–12.0)
Neutrophils Relative %: 57.7 % (ref 43.0–77.0)
RBC: 3.64 Mil/uL — ABNORMAL LOW (ref 3.87–5.11)
WBC: 4.9 10*3/uL (ref 4.5–10.5)

## 2012-11-19 LAB — BASIC METABOLIC PANEL
Calcium: 9.3 mg/dL (ref 8.4–10.5)
Creatinine, Ser: 1.3 mg/dL — ABNORMAL HIGH (ref 0.4–1.2)
GFR: 42.36 mL/min — ABNORMAL LOW (ref >60.00)
Sodium: 138 mEq/L (ref 135–145)

## 2012-11-19 NOTE — Progress Notes (Signed)
Pt started cardiac rehab today.  Pt tolerated light exercise without difficulty. Telemetry rhythm Sinus rhythm with frequent PAC's, occasional PVC's.  Vital signs stable.  Julie Lara only walked the track this morning.  Julie Lara recently went to the dermatologist and had some minor surgery to her face.  The dermatologist told the patient  To walk only this week and to advance her activity next week. Will continue to monitor the patient throughout  the program.

## 2012-11-21 ENCOUNTER — Encounter (HOSPITAL_COMMUNITY): Payer: Medicare Other

## 2012-11-21 ENCOUNTER — Telehealth (HOSPITAL_COMMUNITY): Payer: Self-pay | Admitting: Internal Medicine

## 2012-11-24 ENCOUNTER — Encounter (HOSPITAL_COMMUNITY): Payer: Medicare Other

## 2012-11-24 ENCOUNTER — Encounter (HOSPITAL_COMMUNITY)
Admission: RE | Admit: 2012-11-24 | Discharge: 2012-11-24 | Disposition: A | Discharge status: Home / Self Care | Payer: Medicare Other | Source: Ambulatory Visit | Attending: Cardiology | Admitting: Cardiology

## 2012-11-26 ENCOUNTER — Encounter (HOSPITAL_COMMUNITY)
Admission: RE | Admit: 2012-11-26 | Discharge: 2012-11-26 | Disposition: A | Discharge status: Home / Self Care | Payer: Medicare Other | Source: Ambulatory Visit | Attending: Cardiology | Admitting: Cardiology

## 2012-11-26 ENCOUNTER — Encounter (HOSPITAL_COMMUNITY): Payer: Medicare Other

## 2012-11-28 ENCOUNTER — Encounter (HOSPITAL_COMMUNITY): Payer: Medicare Other

## 2012-11-28 ENCOUNTER — Encounter (HOSPITAL_COMMUNITY)
Admission: RE | Admit: 2012-11-28 | Discharge: 2012-11-28 | Disposition: A | Discharge status: Home / Self Care | Payer: Medicare Other | Source: Ambulatory Visit | Attending: Cardiology | Admitting: Cardiology

## 2012-11-28 NOTE — Progress Notes (Signed)
Reviewed home exercise with pt today.  Pt plans to continue walking at home for exercise.  Also, pt plans to continue to use her exercise bands.  Reviewed THR, pulse, RPE, sign and symptoms, NTG use, and when to call 911 or MD.  Pt voiced understanding. Alberteen Sam, MA, ACSM RCEP

## 2012-12-01 ENCOUNTER — Encounter (HOSPITAL_COMMUNITY): Payer: Medicare Other

## 2012-12-03 ENCOUNTER — Encounter (HOSPITAL_COMMUNITY)
Admission: RE | Admit: 2012-12-03 | Discharge: 2012-12-03 | Disposition: A | Discharge status: Home / Self Care | Payer: Medicare Other | Source: Ambulatory Visit | Attending: Cardiology | Admitting: Cardiology

## 2012-12-03 ENCOUNTER — Encounter (HOSPITAL_COMMUNITY): Payer: Medicare Other

## 2012-12-03 DIAGNOSIS — I359 Nonrheumatic aortic valve disorder, unspecified: Secondary | ICD-10-CM | POA: Insufficient documentation

## 2012-12-03 DIAGNOSIS — I4891 Unspecified atrial fibrillation: Secondary | ICD-10-CM | POA: Insufficient documentation

## 2012-12-03 DIAGNOSIS — Z5189 Encounter for other specified aftercare: Secondary | ICD-10-CM | POA: Diagnosis not present

## 2012-12-03 DIAGNOSIS — Z9861 Coronary angioplasty status: Secondary | ICD-10-CM | POA: Insufficient documentation

## 2012-12-03 DIAGNOSIS — I428 Other cardiomyopathies: Secondary | ICD-10-CM | POA: Diagnosis not present

## 2012-12-05 ENCOUNTER — Encounter (HOSPITAL_COMMUNITY): Payer: Medicare Other

## 2012-12-05 ENCOUNTER — Encounter (HOSPITAL_COMMUNITY)
Admission: RE | Admit: 2012-12-05 | Discharge: 2012-12-05 | Disposition: A | Discharge status: Home / Self Care | Payer: Medicare Other | Source: Ambulatory Visit | Attending: Cardiology | Admitting: Cardiology

## 2012-12-05 NOTE — Progress Notes (Signed)
Julie Lara has been having some intermittent and resting low blood pressures at cardiac rehab. Hisako is doing well with exercise and has no complaints or symptoms.  Charlot daughter says she sometimes a little wobbly at home. Will fax exercise flow sheets to Dr. Artelia Laroche office for review. Exit blood pressure 106/60. Will continue to monitor the patient throughout  the program.

## 2012-12-08 ENCOUNTER — Encounter (HOSPITAL_COMMUNITY)
Admission: RE | Admit: 2012-12-08 | Discharge: 2012-12-08 | Disposition: A | Discharge status: Home / Self Care | Payer: Medicare Other | Source: Ambulatory Visit | Attending: Cardiology | Admitting: Cardiology

## 2012-12-08 ENCOUNTER — Encounter (HOSPITAL_COMMUNITY): Payer: Medicare Other

## 2012-12-08 ENCOUNTER — Telehealth: Payer: Self-pay

## 2012-12-08 NOTE — Telephone Encounter (Signed)
Received call from [place] she wanted to know if I received B/P readings on patient she faxed over.She stated patient's B/P has been low.B/P readings were shown to Truitt Merle NP she advised to stop amlodipine.Advised to continue to monitor B/P and call back if needed.

## 2012-12-08 NOTE — Progress Notes (Signed)
Truitt Merle ANP-c reviewed Julie Lara blood pressure and is going to  Stop her amlodipine at this time. Cheryl nurse at Dr Doug Sou office gave the patient instructions over the phone. Will continue to monitor the patient throughout  the program.

## 2012-12-08 NOTE — Progress Notes (Addendum)
I sent today's ECg tracings for Dr Doug Sou office to review.  Unsure whether a P wave is present.  Julie Lara has a history of Atrial fibrillation and is on eliquis. Faxed today's ECG tracings for Dr Martinique to review. Today's blood pressure and heart stable today with exercise.

## 2012-12-10 ENCOUNTER — Encounter (HOSPITAL_COMMUNITY)
Admission: RE | Admit: 2012-12-10 | Discharge: 2012-12-10 | Disposition: A | Discharge status: Home / Self Care | Payer: Medicare Other | Source: Ambulatory Visit | Attending: Cardiology | Admitting: Cardiology

## 2012-12-10 ENCOUNTER — Encounter (HOSPITAL_COMMUNITY): Payer: Medicare Other

## 2012-12-10 NOTE — Progress Notes (Signed)
Truitt Merle ANP-C reviewed Julie Lara ECG and said that she is in Atrial Fibrillation. No changes ordered in therapy. Will continue to monitor the patient throughout  the program.

## 2012-12-12 ENCOUNTER — Encounter (HOSPITAL_COMMUNITY): Payer: Medicare Other

## 2012-12-12 ENCOUNTER — Encounter (HOSPITAL_COMMUNITY)
Admission: RE | Admit: 2012-12-12 | Discharge: 2012-12-12 | Disposition: A | Discharge status: Home / Self Care | Payer: Medicare Other | Source: Ambulatory Visit | Attending: Cardiology | Admitting: Cardiology

## 2012-12-15 ENCOUNTER — Encounter: Payer: Self-pay | Admitting: Nurse Practitioner

## 2012-12-15 ENCOUNTER — Ambulatory Visit (INDEPENDENT_AMBULATORY_CARE_PROVIDER_SITE_OTHER): Payer: Medicare Other | Admitting: Nurse Practitioner

## 2012-12-15 ENCOUNTER — Encounter (HOSPITAL_COMMUNITY)
Admission: RE | Admit: 2012-12-15 | Discharge: 2012-12-15 | Disposition: A | Discharge status: Home / Self Care | Payer: Medicare Other | Source: Ambulatory Visit | Attending: Cardiology | Admitting: Cardiology

## 2012-12-15 ENCOUNTER — Encounter (HOSPITAL_COMMUNITY): Payer: Medicare Other

## 2012-12-15 VITALS — BP 116/70 | HR 60 | Ht 63.0 in | Wt 140.8 lb

## 2012-12-15 DIAGNOSIS — E785 Hyperlipidemia, unspecified: Secondary | ICD-10-CM | POA: Diagnosis not present

## 2012-12-15 DIAGNOSIS — I4891 Unspecified atrial fibrillation: Secondary | ICD-10-CM | POA: Diagnosis not present

## 2012-12-15 DIAGNOSIS — I428 Other cardiomyopathies: Secondary | ICD-10-CM

## 2012-12-15 DIAGNOSIS — I420 Dilated cardiomyopathy: Secondary | ICD-10-CM

## 2012-12-15 LAB — HEPATIC FUNCTION PANEL
ALT: 17 U/L (ref 0–35)
AST: 19 U/L (ref 0–37)
Albumin: 3.9 g/dL (ref 3.5–5.2)
Alkaline Phosphatase: 73 U/L (ref 39–117)
Bilirubin, Direct: 0.1 mg/dL (ref 0.0–0.3)
Total Bilirubin: 0.6 mg/dL (ref 0.3–1.2)
Total Protein: 6.5 g/dL (ref 6.0–8.3)

## 2012-12-15 LAB — LIPID PANEL
Cholesterol: 146 mg/dL (ref 0–200)
HDL: 64.1 mg/dL (ref >39.00)
LDL Cholesterol: 71 mg/dL (ref 0–99)
Total CHOL/HDL Ratio: 2
Triglycerides: 54 mg/dL (ref 0.0–149.0)
VLDL: 10.8 mg/dL (ref 0.0–40.0)

## 2012-12-15 LAB — TSH: TSH: 0.14 u[IU]/mL — ABNORMAL LOW (ref 0.35–5.50)

## 2012-12-15 NOTE — Progress Notes (Signed)
Julie Lara Date of Birth: 26-Feb-1921 Medical Record [id number]  History of Present Illness: [name] is seen back today for a 5 week check. Seen for Dr. Martinique. She is a 79 year old female with a history of PAF, dilated CM, AI, PACs, hypothyroidism, CKD stage III and prior UTIs.  Seen in early August after being in the hospital with atrial fib with RVR. Treated with IV diltiazem and placed on Eliquis. TSH was depressed and her Synthroid was cut back. Troponins slightly up and so she was cathed and had BMS to the RCA. EF per echo was 40%, moderate LV dilatation, mild LVH, mild MR, mild biatrial enlargement and changes questionable for IVC thrombus. Plan was determined to proceed with Plavix for one month without aspirin, then stop the Plavix and use low dose aspirin with concomitant Eliquis. Beta blocker and statin therapy were started as well. She was doing well when I last saw her.  Comes back today. Here alone today. Continues to do well. Going to cardiac rehab and enjoying the exercise. No chest pain. Not short of breath. Not dizzy or lightheaded. Tolerating her medicines. Now off the Plavix and just on aspirin and Eliquis. No bleeding or bruising. Overall, she feels like she is doing well.   Current Outpatient Prescriptions  Medication Sig Dispense Refill  . apixaban (ELIQUIS) 2.5 MG TABS tablet Take 1 tablet (2.5 mg total) by mouth 2 (two) times daily.  60 tablet  1  . aspirin 81 MG tablet Take 81 mg by mouth daily.      Marland Kitchen atorvastatin (LIPITOR) 40 MG tablet Take 1 tablet (40 mg total) by mouth daily at 6 PM.  30 tablet  1  . Cholecalciferol (VITAMIN D PO) Take 1,000 Units by mouth daily.       Marland Kitchen levothyroxine (SYNTHROID, LEVOTHROID) 50 MCG tablet Take 1 tablet (50 mcg total) by mouth daily before breakfast.  30 tablet  3  . metoprolol tartrate (LOPRESSOR) 25 MG tablet Take 1 tablet (25 mg total) by mouth 2 (two) times daily.  60 tablet  1  . nitroGLYCERIN (NITROSTAT) 0.4 MG SL tablet  Place 1 tablet (0.4 mg total) under the tongue every 5 (five) minutes as needed for chest pain.  25 tablet  3   No current facility-administered medications for this visit.    No Known Allergies  Past Medical History  Diagnosis Date  . 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 History of thyroidectomy   . Dilated cardiomyopathy   . Aortic insufficiency     mild to moderate  . PAC (premature atrial contraction)     Chronic  . Hypothyroidism   . Idiopathic cardiomyopathy 1999    Hx with last EF in 2012 was 50-55%  . Goiter   . NSTEMI (non-ST elevated myocardial infarction) July 2014    95% prox RCA s/p BMS    Past Surgical History  Procedure Laterality Date  . Cardiac catheterization    . Thyroidectomy    . Total vaginal hysterectomy      Complete  . Coronary angioplasty with stent placement      BMS-95% prox RCA    History  Smoking status  . Never Smoker   Smokeless tobacco  . Never Used    History  Alcohol Use No    History reviewed. No pertinent family history.  Review of Systems: The review of systems is per the HPI.  All other systems were reviewed and  are negative.  Physical Exam: BP 116/70  Pulse 60  Ht 5\' 3"  (1.6 m)  Wt 140 lb 12.8 oz (63.866 kg)  BMI 24.95 kg/m2 Patient is very pleasant and in no acute distress. Looks younger than her stated age. Skin is warm and dry. Color is normal.  HEENT is unremarkable. Normocephalic/atraumatic. PERRL. Sclera are nonicteric. Neck is supple. No masses. No JVD. Lungs are clear. Cardiac exam shows an irregular rhythm. Rate is controlled. Abdomen is soft. Extremities full but are without edema. Gait and ROM are intact. No gross neurologic deficits noted.  LABORATORY DATA: PENDING  Lab Results  Component Value Date   WBC 4.9 11/19/2012   HGB 11.0* 11/19/2012   HCT 32.1* 11/19/2012   PLT 187.0 11/19/2012   GLUCOSE 86 11/19/2012   ALT 10 10/12/2012   AST 15 10/12/2012   NA 138 11/19/2012   K 4.2  11/19/2012   CL 105 11/19/2012   CREATININE 1.3* 11/19/2012   BUN 25* 11/19/2012   CO2 27 11/19/2012   TSH 0.155* 10/12/2012   INR 1.16 10/13/2012   Echo Study Conclusions  - Left ventricle: The cavity size was moderately dilated. Wall thickness was increased in a pattern of mild LVH. The estimated ejection fraction was 40%. Diffuse hypokinesis. - Aortic valve: Cannot tell if valve is tri leaflet or not Mild regurgitation. - Mitral valve: Mild regurgitation. - Left atrium: The atrium was mildly dilated. - Right atrium: The atrium was mildly dilated. - Atrial septum: No defect or patent foramen ovale was identified. - Impressions: Cannot r/o thrombus in IVC on limited subcostal window  Coronary angiography:  Left Main: Normal  Left Anterior Descending (LAD): Normal in size with moderate calcifications. There is a 50% ostial stenosis followed by diffuse 40% disease proximally. There is a 40% discrete stenosis in the midsegment. The rest of the vessel has minor irregularities.  1st diagonal (D1): Normal in size with 20% ostial stenosis  2nd diagonal (D2): Normal in size with minor irregularities.  3rd diagonal (D3): Very small in size.  Circumflex (LCx): Normal in size and codominant. The vessel has minor irregularities with no evidence of obstructive disease.  1st obtuse marginal: Very small in size with no significant disease.  2nd obtuse marginal: Large in size with minor irregularities.  3rd obtuse marginal: Small in size with no significant disease.  AV groove continuation segment: Large in size with no significant disease. It gives 2 posterolateral branches which are free of significant disease.  Right Coronary Artery: Normal in size and codominant. There is a discrete 95% stenosis proximally. The rest of the vessel has no significant disease.  Posterior descending artery: Normal in size with no significant disease.  Left ventriculography: Was not performed.   Final Conclusions:    1. Severe one-vessel coronary artery disease.  2. Successful angioplasty and bare-metal stent placement to the proximal right coronary artery.  3. Elevated BP with mildly elevated LVEDP.  Recommendations:  I will resume Eliquis tomorrow morning. I recommend treatment with Plavix without aspirin for one month. After one month, Plavix can be switched to aspirin 81 mg once daily to be continued with anticoagulation. I will start small dose metoprolol 25 mg twice daily and atorvastatin.  [name] MD, [facility]  10/14/2012, 8:54 AM      Assessment / Plan:  1. CAD - with recent NSTEMI per troponins - s/p cath with PCI with BMS to the RCA - EF of 40% per her echo - she continues to do  well. I have left her on her current regimen. Now on aspirin with her Eliquis.   2. Atrial fib - rate is well controlled. She is on Eliquis. Not symptomatic.   3. Hypothyroidism - rechecking TSH today - she tells me her dose has not been cut back/changed. Will reassess.   4. HLD - now on statin therapy. She is fasting today and we will update her labs.   5. LV dysfunction - EF was 40% by echo - currently looks well compensated. Would favor continued medical management.   She is felt to be doing well. Dr. Martinique will see her in 3 months.   Patient is agreeable to this plan and will call if any problems develop in the interim.   Burtis Junes, RN, ANP-C  [place]  [address] [place]  [place], [place] [address]    Assessment / Plan:

## 2012-12-15 NOTE — Patient Instructions (Addendum)
I think you are doing well  We need to recheck labs today  See Dr.Jordan in 3 months  Call the [place] office at [phone] if you have any questions, problems or concerns.

## 2012-12-16 ENCOUNTER — Other Ambulatory Visit: Payer: Self-pay | Admitting: *Deleted

## 2012-12-16 DIAGNOSIS — E0590 Thyrotoxicosis, unspecified without thyrotoxic crisis or storm: Secondary | ICD-10-CM

## 2012-12-16 MED ORDER — LEVOTHYROXINE SODIUM 25 MCG PO TABS: ORAL_TABLET | ORAL | Status: DC

## 2012-12-17 ENCOUNTER — Encounter (HOSPITAL_COMMUNITY): Payer: Medicare Other

## 2012-12-17 ENCOUNTER — Encounter (HOSPITAL_COMMUNITY)
Admission: RE | Admit: 2012-12-17 | Discharge: 2012-12-17 | Disposition: A | Discharge status: Home / Self Care | Payer: Medicare Other | Source: Ambulatory Visit | Attending: Cardiology | Admitting: Cardiology

## 2012-12-19 ENCOUNTER — Encounter (HOSPITAL_COMMUNITY): Payer: Medicare Other

## 2012-12-19 ENCOUNTER — Encounter (HOSPITAL_COMMUNITY)
Admission: RE | Admit: 2012-12-19 | Discharge: 2012-12-19 | Disposition: A | Discharge status: Home / Self Care | Payer: Medicare Other | Source: Ambulatory Visit | Attending: Cardiology | Admitting: Cardiology

## 2012-12-22 ENCOUNTER — Encounter (HOSPITAL_COMMUNITY)
Admission: RE | Admit: 2012-12-22 | Discharge: 2012-12-22 | Disposition: A | Discharge status: Home / Self Care | Payer: Medicare Other | Source: Ambulatory Visit | Attending: Cardiology | Admitting: Cardiology

## 2012-12-22 ENCOUNTER — Encounter (HOSPITAL_COMMUNITY): Payer: Medicare Other

## 2012-12-24 ENCOUNTER — Encounter (HOSPITAL_COMMUNITY)
Admission: RE | Admit: 2012-12-24 | Discharge: 2012-12-24 | Disposition: A | Discharge status: Home / Self Care | Payer: Medicare Other | Source: Ambulatory Visit | Attending: Cardiology | Admitting: Cardiology

## 2012-12-24 ENCOUNTER — Encounter (HOSPITAL_COMMUNITY): Payer: Medicare Other

## 2012-12-26 ENCOUNTER — Encounter (HOSPITAL_COMMUNITY): Payer: Medicare Other

## 2012-12-26 ENCOUNTER — Encounter (HOSPITAL_COMMUNITY)
Admission: RE | Admit: 2012-12-26 | Discharge: 2012-12-26 | Disposition: A | Discharge status: Home / Self Care | Payer: Medicare Other | Source: Ambulatory Visit | Attending: Cardiology | Admitting: Cardiology

## 2012-12-29 ENCOUNTER — Encounter (HOSPITAL_COMMUNITY): Payer: Medicare Other

## 2012-12-29 ENCOUNTER — Encounter (HOSPITAL_COMMUNITY)
Admission: RE | Admit: 2012-12-29 | Discharge: 2012-12-29 | Disposition: A | Discharge status: Home / Self Care | Payer: Medicare Other | Source: Ambulatory Visit | Attending: Cardiology | Admitting: Cardiology

## 2012-12-31 ENCOUNTER — Encounter (HOSPITAL_COMMUNITY): Payer: Medicare Other

## 2012-12-31 ENCOUNTER — Encounter (HOSPITAL_COMMUNITY)
Admission: RE | Admit: 2012-12-31 | Discharge: 2012-12-31 | Disposition: A | Discharge status: Home / Self Care | Payer: Medicare Other | Source: Ambulatory Visit | Attending: Cardiology | Admitting: Cardiology

## 2012-12-31 DIAGNOSIS — Z5189 Encounter for other specified aftercare: Secondary | ICD-10-CM | POA: Insufficient documentation

## 2012-12-31 DIAGNOSIS — Z9861 Coronary angioplasty status: Secondary | ICD-10-CM | POA: Insufficient documentation

## 2012-12-31 DIAGNOSIS — I428 Other cardiomyopathies: Secondary | ICD-10-CM | POA: Diagnosis not present

## 2012-12-31 DIAGNOSIS — I359 Nonrheumatic aortic valve disorder, unspecified: Secondary | ICD-10-CM | POA: Insufficient documentation

## 2012-12-31 DIAGNOSIS — I4891 Unspecified atrial fibrillation: Secondary | ICD-10-CM | POA: Diagnosis not present

## 2013-01-02 ENCOUNTER — Encounter (HOSPITAL_COMMUNITY)
Admission: RE | Admit: 2013-01-02 | Discharge: 2013-01-02 | Disposition: A | Discharge status: Home / Self Care | Payer: Medicare Other | Source: Ambulatory Visit | Attending: Cardiology | Admitting: Cardiology

## 2013-01-02 ENCOUNTER — Encounter (HOSPITAL_COMMUNITY): Payer: Medicare Other

## 2013-01-05 ENCOUNTER — Encounter (HOSPITAL_COMMUNITY): Payer: Medicare Other

## 2013-01-05 ENCOUNTER — Encounter (HOSPITAL_COMMUNITY)
Admission: RE | Admit: 2013-01-05 | Discharge: 2013-01-05 | Disposition: A | Discharge status: Home / Self Care | Payer: Medicare Other | Source: Ambulatory Visit | Attending: Cardiology | Admitting: Cardiology

## 2013-01-05 DIAGNOSIS — R5381 Other malaise: Secondary | ICD-10-CM | POA: Diagnosis not present

## 2013-01-05 DIAGNOSIS — I10 Essential (primary) hypertension: Secondary | ICD-10-CM | POA: Diagnosis not present

## 2013-01-05 DIAGNOSIS — E785 Hyperlipidemia, unspecified: Secondary | ICD-10-CM | POA: Diagnosis not present

## 2013-01-05 NOTE — Progress Notes (Signed)
Julie Lara has had some intermittent systolic blood pressures in the upper 80's to 90's at cardiac rehab.  Patient remains asymptomatic and continues to do well with exercise at cardiac rehab. Will fax exercise flow sheets to Dr. Doug Sou office for review.

## 2013-01-07 ENCOUNTER — Encounter (HOSPITAL_COMMUNITY)
Admission: RE | Admit: 2013-01-07 | Discharge: 2013-01-07 | Disposition: A | Discharge status: Home / Self Care | Payer: Medicare Other | Source: Ambulatory Visit | Attending: Cardiology | Admitting: Cardiology

## 2013-01-07 ENCOUNTER — Encounter (HOSPITAL_COMMUNITY): Payer: Medicare Other

## 2013-01-09 ENCOUNTER — Telehealth: Payer: Self-pay | Admitting: Cardiology

## 2013-01-09 ENCOUNTER — Encounter (HOSPITAL_COMMUNITY): Payer: Medicare Other

## 2013-01-09 ENCOUNTER — Encounter (HOSPITAL_COMMUNITY)
Admission: RE | Admit: 2013-01-09 | Discharge: 2013-01-09 | Disposition: A | Discharge status: Home / Self Care | Payer: Medicare Other | Source: Ambulatory Visit | Attending: Cardiology | Admitting: Cardiology

## 2013-01-09 NOTE — Telephone Encounter (Signed)
New problem   Please call Verdis Frederickson concerning pt's low blood pressure.

## 2013-01-09 NOTE — Progress Notes (Addendum)
Blood pressure 94/60 this morning at cardiac rehab via automatic blood pressure. Patient remains asymptomatic. Will continue to monitor bp. Exit blood pressure 110/50.

## 2013-01-09 NOTE — Telephone Encounter (Signed)
Returned call to Verdis Frederickson in cardiac rehab spoke to [place] she stated patient's B/P was low this morning before exercise 94/60.Patient asymptomatic.B/P normal during exercise,will fax over blood pressures for Dr.Jordan to review.

## 2013-01-12 ENCOUNTER — Encounter (HOSPITAL_COMMUNITY)
Admission: RE | Admit: 2013-01-12 | Discharge: 2013-01-12 | Disposition: A | Discharge status: Home / Self Care | Payer: Medicare Other | Source: Ambulatory Visit | Attending: Cardiology | Admitting: Cardiology

## 2013-01-12 ENCOUNTER — Encounter (HOSPITAL_COMMUNITY): Payer: Medicare Other

## 2013-01-14 ENCOUNTER — Encounter (HOSPITAL_COMMUNITY): Payer: Medicare Other

## 2013-01-14 DIAGNOSIS — Z1331 Encounter for screening for depression: Secondary | ICD-10-CM | POA: Diagnosis not present

## 2013-01-14 DIAGNOSIS — I4891 Unspecified atrial fibrillation: Secondary | ICD-10-CM | POA: Diagnosis not present

## 2013-01-14 DIAGNOSIS — I10 Essential (primary) hypertension: Secondary | ICD-10-CM | POA: Diagnosis not present

## 2013-01-14 DIAGNOSIS — M5137 Other intervertebral disc degeneration, lumbosacral region: Secondary | ICD-10-CM | POA: Diagnosis not present

## 2013-01-14 DIAGNOSIS — R5381 Other malaise: Secondary | ICD-10-CM | POA: Diagnosis not present

## 2013-01-14 DIAGNOSIS — I428 Other cardiomyopathies: Secondary | ICD-10-CM | POA: Diagnosis not present

## 2013-01-14 DIAGNOSIS — E785 Hyperlipidemia, unspecified: Secondary | ICD-10-CM | POA: Diagnosis not present

## 2013-01-14 DIAGNOSIS — Z6825 Body mass index (BMI) 25.0-25.9, adult: Secondary | ICD-10-CM | POA: Diagnosis not present

## 2013-01-16 ENCOUNTER — Encounter (HOSPITAL_COMMUNITY): Payer: Medicare Other

## 2013-01-16 ENCOUNTER — Encounter (HOSPITAL_COMMUNITY)
Admission: RE | Admit: 2013-01-16 | Discharge: 2013-01-16 | Disposition: A | Discharge status: Home / Self Care | Payer: Medicare Other | Source: Ambulatory Visit | Attending: Cardiology | Admitting: Cardiology

## 2013-01-19 ENCOUNTER — Encounter (HOSPITAL_COMMUNITY)
Admission: RE | Admit: 2013-01-19 | Discharge: 2013-01-19 | Disposition: A | Discharge status: Home / Self Care | Payer: Medicare Other | Source: Ambulatory Visit | Attending: Cardiology | Admitting: Cardiology

## 2013-01-19 ENCOUNTER — Encounter (HOSPITAL_COMMUNITY): Payer: Medicare Other

## 2013-01-19 NOTE — Progress Notes (Signed)
Julie Lara 79 y.o. female Nutrition Note Spoke with pt.  Nutrition Plan and Nutrition Survey goals reviewed with pt. Pt is following Step 2 of the Therapeutic Lifestyle Changes diet. Age-appropriate diet discussed. Pt expressed understanding of the information reviewed. Pt aware of nutrition education classes offered and does not plan on attending nutrition classes.  Nutrition Diagnosis   Food-and nutrition-related knowledge deficit related to lack of exposure to information as related to diagnosis of: ? CVD   Nutrition RX/ Estimated Daily Nutrition Needs for: wt maintenance 1400-1600 Kcal, 45-50 gm fat, 9-11 gm sat fat, 1.4-1.6 gm trans-fat, <1500 mg sodium   Nutrition Intervention   Pt's individual nutrition plan including cholesterol goals reviewed with pt.   Benefits of adopting Therapeutic Lifestyle Changes discussed when Medficts reviewed.   Pt to attend the Portion Distortion class   Continue client-centered nutrition education by RD, as part of interdisciplinary care.  Goal(s)   Pt to describe the benefit of including fruits, vegetables, whole grains, and low-fat dairy products in a heart healthy meal plan.  Monitor and Evaluate progress toward nutrition goal with team. Nutrition Risk:  Low   Derek Mound, M.Ed, RD, LDN, CDE 01/19/2013 10:54 AM

## 2013-01-21 ENCOUNTER — Encounter: Payer: Self-pay | Admitting: Cardiology

## 2013-01-21 ENCOUNTER — Encounter (HOSPITAL_COMMUNITY): Payer: Medicare Other

## 2013-01-21 ENCOUNTER — Encounter (HOSPITAL_COMMUNITY)
Admission: RE | Admit: 2013-01-21 | Discharge: 2013-01-21 | Disposition: A | Discharge status: Home / Self Care | Payer: Medicare Other | Source: Ambulatory Visit | Attending: Cardiology | Admitting: Cardiology

## 2013-01-21 DIAGNOSIS — Z1212 Encounter for screening for malignant neoplasm of rectum: Secondary | ICD-10-CM | POA: Diagnosis not present

## 2013-01-23 ENCOUNTER — Encounter (HOSPITAL_COMMUNITY): Payer: Medicare Other

## 2013-01-23 ENCOUNTER — Encounter (HOSPITAL_COMMUNITY)
Admission: RE | Admit: 2013-01-23 | Discharge: 2013-01-23 | Disposition: A | Discharge status: Home / Self Care | Payer: Medicare Other | Source: Ambulatory Visit | Attending: Cardiology | Admitting: Cardiology

## 2013-01-26 ENCOUNTER — Encounter (HOSPITAL_COMMUNITY): Payer: Medicare Other

## 2013-01-26 ENCOUNTER — Encounter (HOSPITAL_COMMUNITY)
Admission: RE | Admit: 2013-01-26 | Discharge: 2013-01-26 | Disposition: A | Discharge status: Home / Self Care | Payer: Medicare Other | Source: Ambulatory Visit | Attending: Cardiology | Admitting: Cardiology

## 2013-01-27 ENCOUNTER — Other Ambulatory Visit: Payer: Self-pay

## 2013-01-27 MED ORDER — METOPROLOL TARTRATE 25 MG PO TABS: 25.0000 mg | ORAL_TABLET | Freq: Two times a day (BID) | ORAL | Status: DC

## 2013-01-28 ENCOUNTER — Encounter (HOSPITAL_COMMUNITY): Payer: Medicare Other

## 2013-01-28 ENCOUNTER — Other Ambulatory Visit: Payer: Self-pay | Admitting: Cardiology

## 2013-01-28 ENCOUNTER — Encounter (HOSPITAL_COMMUNITY)
Admission: RE | Admit: 2013-01-28 | Discharge: 2013-01-28 | Disposition: A | Discharge status: Home / Self Care | Payer: Medicare Other | Source: Ambulatory Visit | Attending: Cardiology | Admitting: Cardiology

## 2013-01-29 ENCOUNTER — Telehealth: Payer: Self-pay

## 2013-01-29 NOTE — Telephone Encounter (Signed)
Patient called spoke to daughter [name], received fax from [place] at Cardiac rehab patient had junctional rhythm heart rate 48 during cool down 01/28/13.Dr.Jordan reviewed monitor strips and advised to have patient decrease metoprolol tart to 12.5 mg twice a day.Advised to keep appointment with Dr.Jordan 03/12/13 at 2:30 pm.

## 2013-01-30 ENCOUNTER — Encounter (HOSPITAL_COMMUNITY): Payer: Medicare Other

## 2013-01-30 ENCOUNTER — Encounter (HOSPITAL_COMMUNITY)
Admission: RE | Admit: 2013-01-30 | Discharge: 2013-01-30 | Disposition: A | Discharge status: Home / Self Care | Payer: Medicare Other | Source: Ambulatory Visit | Attending: Cardiology | Admitting: Cardiology

## 2013-01-30 NOTE — Progress Notes (Signed)
Pt brought medication bottles to cardiac rehab today. Medication list reconciled. Pt has started ferrex 150mg  BID per Dr. Baxter Flattery.

## 2013-02-02 ENCOUNTER — Other Ambulatory Visit (INDEPENDENT_AMBULATORY_CARE_PROVIDER_SITE_OTHER): Payer: Medicare Other

## 2013-02-02 ENCOUNTER — Encounter (HOSPITAL_COMMUNITY): Payer: Medicare Other

## 2013-02-02 ENCOUNTER — Encounter (HOSPITAL_COMMUNITY)
Admission: RE | Admit: 2013-02-02 | Discharge: 2013-02-02 | Disposition: A | Discharge status: Home / Self Care | Payer: Medicare Other | Source: Ambulatory Visit | Attending: Cardiology | Admitting: Cardiology

## 2013-02-02 DIAGNOSIS — I4891 Unspecified atrial fibrillation: Secondary | ICD-10-CM | POA: Diagnosis not present

## 2013-02-02 DIAGNOSIS — I359 Nonrheumatic aortic valve disorder, unspecified: Secondary | ICD-10-CM | POA: Insufficient documentation

## 2013-02-02 DIAGNOSIS — E0590 Thyrotoxicosis, unspecified without thyrotoxic crisis or storm: Secondary | ICD-10-CM

## 2013-02-02 DIAGNOSIS — Z9861 Coronary angioplasty status: Secondary | ICD-10-CM | POA: Insufficient documentation

## 2013-02-02 DIAGNOSIS — Z5189 Encounter for other specified aftercare: Secondary | ICD-10-CM | POA: Diagnosis not present

## 2013-02-02 DIAGNOSIS — I428 Other cardiomyopathies: Secondary | ICD-10-CM | POA: Insufficient documentation

## 2013-02-02 LAB — TSH: TSH: 0.56 u[IU]/mL (ref 0.35–5.50)

## 2013-02-04 ENCOUNTER — Encounter (HOSPITAL_COMMUNITY)
Admission: RE | Admit: 2013-02-04 | Discharge: 2013-02-04 | Disposition: A | Discharge status: Home / Self Care | Payer: Medicare Other | Source: Ambulatory Visit | Attending: Cardiology | Admitting: Cardiology

## 2013-02-04 ENCOUNTER — Encounter (HOSPITAL_COMMUNITY): Payer: Medicare Other

## 2013-02-06 ENCOUNTER — Encounter (HOSPITAL_COMMUNITY)
Admission: RE | Admit: 2013-02-06 | Discharge: 2013-02-06 | Disposition: A | Discharge status: Home / Self Care | Payer: Medicare Other | Source: Ambulatory Visit | Attending: Cardiology | Admitting: Cardiology

## 2013-02-06 ENCOUNTER — Encounter (HOSPITAL_COMMUNITY): Payer: Medicare Other

## 2013-02-06 DIAGNOSIS — I428 Other cardiomyopathies: Secondary | ICD-10-CM | POA: Diagnosis not present

## 2013-02-06 DIAGNOSIS — Z6825 Body mass index (BMI) 25.0-25.9, adult: Secondary | ICD-10-CM | POA: Diagnosis not present

## 2013-02-06 DIAGNOSIS — E785 Hyperlipidemia, unspecified: Secondary | ICD-10-CM | POA: Diagnosis not present

## 2013-02-06 DIAGNOSIS — E038 Other specified hypothyroidism: Secondary | ICD-10-CM | POA: Diagnosis not present

## 2013-02-06 DIAGNOSIS — M5137 Other intervertebral disc degeneration, lumbosacral region: Secondary | ICD-10-CM | POA: Diagnosis not present

## 2013-02-06 DIAGNOSIS — I10 Essential (primary) hypertension: Secondary | ICD-10-CM | POA: Diagnosis not present

## 2013-02-06 DIAGNOSIS — I4891 Unspecified atrial fibrillation: Secondary | ICD-10-CM | POA: Diagnosis not present

## 2013-02-09 ENCOUNTER — Encounter (HOSPITAL_COMMUNITY)
Admission: RE | Admit: 2013-02-09 | Discharge: 2013-02-09 | Disposition: A | Discharge status: Home / Self Care | Payer: Medicare Other | Source: Ambulatory Visit | Attending: Cardiology | Admitting: Cardiology

## 2013-02-09 ENCOUNTER — Encounter (HOSPITAL_COMMUNITY): Payer: Medicare Other

## 2013-02-11 ENCOUNTER — Encounter (HOSPITAL_COMMUNITY)
Admission: RE | Admit: 2013-02-11 | Discharge: 2013-02-11 | Disposition: A | Discharge status: Home / Self Care | Payer: Medicare Other | Source: Ambulatory Visit | Attending: Cardiology | Admitting: Cardiology

## 2013-02-11 ENCOUNTER — Encounter (HOSPITAL_COMMUNITY): Payer: Medicare Other

## 2013-02-13 ENCOUNTER — Encounter (HOSPITAL_COMMUNITY): Payer: Medicare Other

## 2013-02-13 ENCOUNTER — Encounter (HOSPITAL_COMMUNITY)
Admission: RE | Admit: 2013-02-13 | Discharge: 2013-02-13 | Disposition: A | Discharge status: Home / Self Care | Payer: Medicare Other | Source: Ambulatory Visit | Attending: Cardiology | Admitting: Cardiology

## 2013-02-16 ENCOUNTER — Encounter (HOSPITAL_COMMUNITY): Payer: Medicare Other

## 2013-02-16 ENCOUNTER — Encounter (HOSPITAL_COMMUNITY)
Admission: RE | Admit: 2013-02-16 | Discharge: 2013-02-16 | Disposition: A | Discharge status: Home / Self Care | Payer: Medicare Other | Source: Ambulatory Visit | Attending: Cardiology | Admitting: Cardiology

## 2013-02-18 ENCOUNTER — Encounter (HOSPITAL_COMMUNITY): Payer: Medicare Other

## 2013-02-20 ENCOUNTER — Encounter (HOSPITAL_COMMUNITY): Payer: Medicare Other

## 2013-02-21 ENCOUNTER — Encounter (HOSPITAL_COMMUNITY): Payer: Self-pay | Admitting: Emergency Medicine

## 2013-02-21 ENCOUNTER — Inpatient Hospital Stay (HOSPITAL_COMMUNITY)
Admission: EM | Admit: 2013-02-21 | Discharge: 2013-02-25 | DRG: 812 | Disposition: A | Discharge status: Home / Self Care | Payer: Medicare Other | Attending: Internal Medicine | Admitting: Internal Medicine

## 2013-02-21 DIAGNOSIS — Z7982 Long term (current) use of aspirin: Secondary | ICD-10-CM

## 2013-02-21 DIAGNOSIS — R195 Other fecal abnormalities: Secondary | ICD-10-CM

## 2013-02-21 DIAGNOSIS — M19079 Primary osteoarthritis, unspecified ankle and foot: Secondary | ICD-10-CM | POA: Diagnosis not present

## 2013-02-21 DIAGNOSIS — N183 Chronic kidney disease, stage 3 unspecified: Secondary | ICD-10-CM | POA: Diagnosis present

## 2013-02-21 DIAGNOSIS — Z79899 Other long term (current) drug therapy: Secondary | ICD-10-CM | POA: Diagnosis not present

## 2013-02-21 DIAGNOSIS — I5022 Chronic systolic (congestive) heart failure: Secondary | ICD-10-CM | POA: Diagnosis present

## 2013-02-21 DIAGNOSIS — D649 Anemia, unspecified: Secondary | ICD-10-CM | POA: Diagnosis not present

## 2013-02-21 DIAGNOSIS — I252 Old myocardial infarction: Secondary | ICD-10-CM | POA: Diagnosis not present

## 2013-02-21 DIAGNOSIS — I2510 Atherosclerotic heart disease of native coronary artery without angina pectoris: Secondary | ICD-10-CM | POA: Diagnosis present

## 2013-02-21 DIAGNOSIS — D371 Neoplasm of uncertain behavior of stomach: Secondary | ICD-10-CM | POA: Diagnosis present

## 2013-02-21 DIAGNOSIS — K5730 Diverticulosis of large intestine without perforation or abscess without bleeding: Secondary | ICD-10-CM | POA: Diagnosis present

## 2013-02-21 DIAGNOSIS — D126 Benign neoplasm of colon, unspecified: Secondary | ICD-10-CM

## 2013-02-21 DIAGNOSIS — I509 Heart failure, unspecified: Secondary | ICD-10-CM | POA: Diagnosis present

## 2013-02-21 DIAGNOSIS — R55 Syncope and collapse: Secondary | ICD-10-CM | POA: Diagnosis present

## 2013-02-21 DIAGNOSIS — E039 Hypothyroidism, unspecified: Secondary | ICD-10-CM | POA: Diagnosis present

## 2013-02-21 DIAGNOSIS — D500 Iron deficiency anemia secondary to blood loss (chronic): Principal | ICD-10-CM | POA: Diagnosis present

## 2013-02-21 DIAGNOSIS — R404 Transient alteration of awareness: Secondary | ICD-10-CM | POA: Diagnosis not present

## 2013-02-21 DIAGNOSIS — Z9861 Coronary angioplasty status: Secondary | ICD-10-CM | POA: Diagnosis not present

## 2013-02-21 DIAGNOSIS — Z7901 Long term (current) use of anticoagulants: Secondary | ICD-10-CM | POA: Diagnosis not present

## 2013-02-21 DIAGNOSIS — K922 Gastrointestinal hemorrhage, unspecified: Secondary | ICD-10-CM | POA: Diagnosis not present

## 2013-02-21 DIAGNOSIS — I428 Other cardiomyopathies: Secondary | ICD-10-CM | POA: Diagnosis not present

## 2013-02-21 DIAGNOSIS — N179 Acute kidney failure, unspecified: Secondary | ICD-10-CM | POA: Diagnosis present

## 2013-02-21 DIAGNOSIS — I4891 Unspecified atrial fibrillation: Secondary | ICD-10-CM | POA: Diagnosis present

## 2013-02-21 DIAGNOSIS — N189 Chronic kidney disease, unspecified: Secondary | ICD-10-CM | POA: Diagnosis present

## 2013-02-21 HISTORY — DX: Unspecified atrial fibrillation: I48.91

## 2013-02-21 LAB — BASIC METABOLIC PANEL
CO2: 21 mEq/L (ref 19–32)
Chloride: 100 mEq/L (ref 96–112)
GFR calc Af Amer: 30 mL/min — ABNORMAL LOW (ref >90)
Potassium: 4.7 mEq/L (ref 3.5–5.1)
Sodium: 135 mEq/L (ref 135–145)

## 2013-02-21 LAB — CBC WITH DIFFERENTIAL/PLATELET
Basophils Absolute: 0 10*3/uL (ref 0.0–0.1)
Basophils Relative: 0 % (ref 0–1)
HCT: 20.7 % — ABNORMAL LOW (ref 36.0–46.0)
Lymphocytes Relative: 16 % (ref 12–46)
Lymphs Abs: 0.9 10*3/uL (ref 0.7–4.0)
MCHC: 31.9 g/dL (ref 30.0–36.0)
Monocytes Absolute: 0.5 10*3/uL (ref 0.1–1.0)
Neutro Abs: 4 10*3/uL (ref 1.7–7.7)
Neutrophils Relative %: 74 % (ref 43–77)
RDW: 14.7 % (ref 11.5–15.5)
WBC: 5.5 10*3/uL (ref 4.0–10.5)

## 2013-02-21 LAB — PREPARE RBC (CROSSMATCH)

## 2013-02-21 LAB — OCCULT BLOOD, POC DEVICE: Fecal Occult Bld: POSITIVE — AB

## 2013-02-21 LAB — TROPONIN I: Troponin I: <0.3 ng/mL (ref <0.30)

## 2013-02-21 MED ORDER — SODIUM CHLORIDE 0.9 % IJ SOLN
3.0000 mL | Freq: Two times a day (BID) | INTRAMUSCULAR | Status: DC
Start: 2013-02-21 — End: 2013-02-25
  Administered 2013-02-21 – 2013-02-25 (×6): 3 mL via INTRAVENOUS

## 2013-02-21 MED ORDER — VITAMIN D 1000 UNITS PO CAPS: 1000.0000 [IU] | ORAL_CAPSULE | Freq: Every day | ORAL | Status: DC

## 2013-02-21 MED ORDER — ONDANSETRON HCL 4 MG/2ML IJ SOLN
4.0000 mg | Freq: Four times a day (QID) | INTRAMUSCULAR | Status: DC | PRN
  Administered 2013-02-24: 4 mg via INTRAVENOUS
  Filled 2013-02-21: qty 2

## 2013-02-21 MED ORDER — ACETAMINOPHEN 650 MG RE SUPP: 650.0000 mg | Freq: Four times a day (QID) | RECTAL | Status: DC | PRN

## 2013-02-21 MED ORDER — LEVOTHYROXINE SODIUM 25 MCG PO TABS
25.0000 ug | ORAL_TABLET | Freq: Every day | ORAL | Status: DC
  Administered 2013-02-22 – 2013-02-25 (×4): 25 ug via ORAL
  Filled 2013-02-21 (×6): qty 1

## 2013-02-21 MED ORDER — PANTOPRAZOLE SODIUM 40 MG PO TBEC
40.0000 mg | DELAYED_RELEASE_TABLET | Freq: Two times a day (BID) | ORAL | Status: DC
  Administered 2013-02-21 – 2013-02-25 (×8): 40 mg via ORAL
  Filled 2013-02-21 (×8): qty 1

## 2013-02-21 MED ORDER — PANTOPRAZOLE SODIUM 40 MG PO TBEC: 40.0000 mg | DELAYED_RELEASE_TABLET | Freq: Two times a day (BID) | ORAL | Status: DC

## 2013-02-21 MED ORDER — ATORVASTATIN CALCIUM 40 MG PO TABS
40.0000 mg | ORAL_TABLET | Freq: Every day | ORAL | Status: DC
  Administered 2013-02-22 – 2013-02-24 (×3): 40 mg via ORAL
  Filled 2013-02-21 (×4): qty 1

## 2013-02-21 MED ORDER — ALUM & MAG HYDROXIDE-SIMETH 200-200-20 MG/5ML PO SUSP: 30.0000 mL | Freq: Four times a day (QID) | ORAL | Status: DC | PRN

## 2013-02-21 MED ORDER — POLYSACCHARIDE IRON COMPLEX 150 MG PO CAPS
150.0000 mg | ORAL_CAPSULE | Freq: Every day | ORAL | Status: DC
  Administered 2013-02-22 – 2013-02-25 (×3): 150 mg via ORAL
  Filled 2013-02-21 (×4): qty 1

## 2013-02-21 MED ORDER — ONDANSETRON HCL 4 MG PO TABS: 4.0000 mg | ORAL_TABLET | Freq: Four times a day (QID) | ORAL | Status: DC | PRN

## 2013-02-21 MED ORDER — DOCUSATE SODIUM 100 MG PO CAPS
100.0000 mg | ORAL_CAPSULE | Freq: Two times a day (BID) | ORAL | Status: DC
  Administered 2013-02-21 – 2013-02-25 (×7): 100 mg via ORAL
  Filled 2013-02-21 (×9): qty 1

## 2013-02-21 MED ORDER — METOPROLOL TARTRATE 25 MG PO TABS
25.0000 mg | ORAL_TABLET | Freq: Two times a day (BID) | ORAL | Status: DC
  Administered 2013-02-21 – 2013-02-25 (×7): 25 mg via ORAL
  Filled 2013-02-21 (×9): qty 1

## 2013-02-21 MED ORDER — NITROGLYCERIN 0.4 MG SL SUBL: 0.4000 mg | SUBLINGUAL_TABLET | SUBLINGUAL | Status: DC | PRN

## 2013-02-21 MED ORDER — VITAMIN D3 25 MCG (1000 UNIT) PO TABS
1000.0000 [IU] | ORAL_TABLET | Freq: Every day | ORAL | Status: DC
  Administered 2013-02-21 – 2013-02-25 (×4): 1000 [IU] via ORAL
  Filled 2013-02-21 (×5): qty 1

## 2013-02-21 MED ORDER — ACETAMINOPHEN 325 MG PO TABS
650.0000 mg | ORAL_TABLET | Freq: Four times a day (QID) | ORAL | Status: DC | PRN
  Administered 2013-02-24: 650 mg via ORAL
  Filled 2013-02-21: qty 2

## 2013-02-21 NOTE — ED Provider Notes (Signed)
CSN: [id number]     Arrival date & time 02/21/13  1443 History   First MD Initiated Contact with Patient 02/21/13 1452     Chief Complaint  Patient presents with  . Loss of Consciousness   (Consider location/radiation/quality/duration/timing/severity/associated sxs/prior Treatment) HPI Julie Lara is a 79 y.o. female who presents to the emergency department after a syncopal episode.  Patient reports that she had a bowel movement and was feeling weak afterwards.  She went outside right afterwards and then had an episode where she lost all function of her legs and simply fell to the ground.  Reported gentle fall.  No head hit.  No palpitations, SOB, or chest pain during episode.  Patient rested on ground.  911 called and recommended patient staying on ground until ambulance got there, however patient able to stand up.  Patient now feeling better and back to baseline.  No other symptoms.  Past Medical History  Diagnosis Date  . 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 History of thyroidectomy   . Dilated cardiomyopathy   . Aortic insufficiency     mild to moderate  . PAC (premature atrial contraction)     Chronic  . Hypothyroidism   . Idiopathic cardiomyopathy 1999    Hx with last EF in 2012 was 50-55%  . Goiter   . NSTEMI (non-ST elevated myocardial infarction) July 2014    95% prox RCA s/p BMS   Past Surgical History  Procedure Laterality Date  . Cardiac catheterization    . Thyroidectomy    . Total vaginal hysterectomy      Complete  . Coronary angioplasty with stent placement      BMS-95% prox RCA   No family history on file. History  Substance Use Topics  . Smoking status: Never Smoker   . Smokeless tobacco: Never Used  . Alcohol Use: No   OB History   Grav Para Term Preterm Abortions TAB SAB Ect Mult Living                 Review of Systems  Constitutional: Negative for fever and chills.  HENT: Negative for congestion and rhinorrhea.    Respiratory: Negative for cough and shortness of breath.   Cardiovascular: Negative for chest pain.  Gastrointestinal: Negative for nausea, vomiting, abdominal pain, diarrhea and abdominal distention.  Endocrine: Negative for polyuria.  Genitourinary: Negative for dysuria.  Musculoskeletal: Negative for neck pain and neck stiffness.  Skin: Negative for rash.  Neurological: Positive for syncope. Negative for headaches.  Psychiatric/Behavioral: Negative.     Allergies  Review of patient's allergies indicates no known allergies.  Home Medications   Current Outpatient Rx  Name  Route  Sig  Dispense  Refill  . apixaban (ELIQUIS) 2.5 MG TABS tablet   Oral   Take 1 tablet (2.5 mg total) by mouth 2 (two) times daily.   60 tablet   1   . aspirin 81 MG tablet   Oral   Take 81 mg by mouth daily.         Marland Kitchen atorvastatin (LIPITOR) 40 MG tablet   Oral   Take 1 tablet (40 mg total) by mouth daily at 6 PM.   30 tablet   1   . Cholecalciferol (VITAMIN D PO)   Oral   Take 1,000 Units by mouth daily.          . iron polysaccharides (FERREX 150) 150 MG capsule   Oral  Take 150 mg by mouth daily.          Marland Kitchen levothyroxine (SYNTHROID, LEVOTHROID) 25 MCG tablet      Take one tablet daily   90 tablet   3   . metoprolol tartrate (LOPRESSOR) 25 MG tablet      Take 1/2 tablet twice a day   30 tablet   6   . nitroGLYCERIN (NITROSTAT) 0.4 MG SL tablet   Sublingual   Place 1 tablet (0.4 mg total) under the tongue every 5 (five) minutes as needed for chest pain.   25 tablet   3    BP 140/63  Temp(Src) 97.5 F (36.4 C)  Resp 20  SpO2 98% Physical Exam  Nursing note and vitals reviewed. Constitutional: She is oriented to person, place, and time. She appears well-developed and well-nourished. No distress.  HENT:  Head: Normocephalic and atraumatic.  Right Ear: External ear normal.  Left Ear: External ear normal.  Nose: Nose normal.  Mouth/Throat: Oropharynx is clear and  moist. No oropharyngeal exudate.  Eyes: EOM are normal. Pupils are equal, round, and reactive to light.  Neck: Normal range of motion. Neck supple. No tracheal deviation present.  Cardiovascular: Normal rate.   Pulmonary/Chest: Effort normal and breath sounds normal. No stridor. No respiratory distress. She has no wheezes. She has no rales.  Abdominal: Soft. She exhibits no distension. There is no tenderness. There is no rebound.  Genitourinary: Rectal exam shows no mass and no tenderness. Guaiac positive stool.  Musculoskeletal: Normal range of motion.  Neurological: She is alert and oriented to person, place, and time. She has normal strength. No cranial nerve deficit or sensory deficit. She displays a negative Romberg sign. Coordination and gait normal.  Skin: Skin is warm and dry. She is not diaphoretic.    ED Course  Procedures (including critical care time) Labs Review Labs Reviewed  CBC WITH DIFFERENTIAL - Abnormal; Notable for the following:    RBC 2.49 (*)    Hemoglobin 6.6 (*)    HCT 20.7 (*)    All other components within normal limits  BASIC METABOLIC PANEL - Abnormal; Notable for the following:    BUN 32 (*)    Creatinine, Ser 1.65 (*)    GFR calc non Af Amer 26 (*)    GFR calc Af Amer 30 (*)    All other components within normal limits  OCCULT BLOOD, POC DEVICE - Abnormal; Notable for the following:    Fecal Occult Bld POSITIVE (*)    All other components within normal limits  TROPONIN I  TROPONIN I  BASIC METABOLIC PANEL  CBC  TROPONIN I  TROPONIN I  TSH  VITAMIN B12  FOLATE  IRON AND TIBC  FERRITIN  RETICULOCYTES  TYPE AND SCREEN  PREPARE RBC (CROSSMATCH)  ABO/RH  PREPARE RBC (CROSSMATCH)   Imaging Review No results found.  EKG Interpretation    Date/Time:  Saturday February 21 2013 14:50:38 EST Ventricular Rate:  62 PR Interval:  175 QRS Duration: 108 QT Interval:  457 QTC Calculation: 464 R Axis:   -25 Text Interpretation:  Sinus rhythm  Atrial premature complexes Borderline left axis deviation RSR' in V1 or V2, probably normal variant Nonspecific repol abnormality, diffuse leads Confirmed by [place] [phone]) on 02/21/2013 3:02:08 PM            MDM   1. GI bleed    Julie Lara is a 79 y.o. female who presented to the ED  with a near syncopal event. Basic workup initiated given age and CHF.  Workup positive for Hg of 6.6.  Rectal exam performed and with frank melena.  Will initiate transfusion.  Internal medicine consulted for admission.  Patient safe for admission to stepdown unit.  No other acute concerns while in ED.  Patient admitted without symptoms.       Rogelia Mire, MD 02/21/13 2351

## 2013-02-21 NOTE — ED Notes (Signed)
Internal medicine consult at bedside.

## 2013-02-21 NOTE — H&P (Signed)
PCP:   Geoffery Lyons, MD   Chief Complaint:  Weakness, nearly passed out  HPI: [name] is a remarkably vibrant 79 year old white female with a history of coronary artery disease with non-ST elevation MI s/p PCI (7/14), idiopathic cardiomyopathy (ejection fraction 40%), and atrial fibrillation on anticoagulation with Eliquis who presented to the emergency department with the complaint of profound weakness. The patient states she has been weak and fatigued since her MI in 7/14. She has been participating in cardiac rehabilitation. She's done well with this stating that generally improves as the day goes along. She saw Dr. Reynaldo Minium about 3 weeks ago and was found to be anemic with a Hg of 8.8. She was started on iron at that time.  Over the past several days she feels like she is become increasingly weak. Today, she had a bowel movement. She then tried to leave the house to go to lunch with her family. After walking out of the house she became very weak and slumped to the ground. She was minimally responsive for about 5 minutes but no definite loss of consciousness. She was unable to stand do to weakness. No injury.  Did not hit head.  Denies any focal neurological deficit. No seizure activity. No loss of bowel or bladder function. EMS was called she was brought to the emergency department where her hemoglobin is now 6.6.  ER MD reports heme positive dark stool.  EKG shows atrial fibrillation but no acute changes. Cardiac enzymes are negative. She denies any chest pain, palpitations, or significant shortness of breath.  She has some constipation and noted dark stools recently but she attributed that to iron. She did take 2 extra aspirin within the past week but denies any NSAID use. No abdominal pain, diarrhea.  She has never had colonoscopy or EGD.  Review of Systems:  Review of Systems - all systems reviewed with patient and are negative except in history of present illness the following exceptions:  Chronic back pain related to a prior fall.  Past Medical History  Diagnosis Date  . 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 History of thyroidectomy   . Dilated cardiomyopathy   . Aortic insufficiency     mild to moderate  . PAC (premature atrial contraction)     Chronic  . Hypothyroidism   . Idiopathic cardiomyopathy 1999    EF 40% by Echo on 10/13/12  . Goiter   . NSTEMI (non-ST elevated myocardial infarction) July 2014    95% prox RCA s/p BMS  . Atrial fibrillation     Past Surgical History  Procedure Laterality Date  . Cardiac catheterization    . Thyroidectomy    . Total vaginal hysterectomy      Complete  . Coronary angioplasty with stent placement      BMS-95% prox RCA    Medications: Prior to Admission medications   Medication Sig Start Date End Date Taking? Authorizing Provider  apixaban (ELIQUIS) 2.5 MG TABS tablet Take 1 tablet (2.5 mg total) by mouth 2 (two) times daily. 11/13/12  Yes Peter M Martinique, MD  aspirin 81 MG tablet Take 81 mg by mouth daily.   Yes Historical Provider, MD  atorvastatin (LIPITOR) 40 MG tablet Take 1 tablet (40 mg total) by mouth daily at 6 PM. 11/13/12  Yes Peter M Martinique, MD  Cholecalciferol (VITAMIN D PO) Take 1,000 Units by mouth daily.    Yes Historical Provider, MD  iron polysaccharides (FERREX 150) 150  MG capsule Take 150 mg by mouth daily.    Yes Historical Provider, MD  levothyroxine (SYNTHROID, LEVOTHROID) 25 MCG tablet Take 25 mcg by mouth daily before breakfast.   Yes Historical Provider, MD  metoprolol tartrate (LOPRESSOR) 25 MG tablet Take 1/2 tablet twice a day 01/29/13  Yes Peter M Martinique, MD  nitroGLYCERIN (NITROSTAT) 0.4 MG SL tablet Place 1 tablet (0.4 mg total) under the tongue every 5 (five) minutes as needed for chest pain. 10/15/12  Yes Roger A Arguello, PA-C    Allergies:  No Known Allergies  Social History: Widow.  Lives alone.  Independent with all IADLs and ADLs.  Continues to drive.  No  ETOH or smoking.     Family History: Not contributory.    Physical Exam: Filed Vitals:   02/21/13 1815 02/21/13 1830 02/21/13 1831 02/21/13 1832  BP: 128/95 138/74  148/61  Pulse:  75 72   Temp:   98.1 F (36.7 C)   TempSrc:   Oral   Resp: 18 19 18 15   SpO2:  97% 100%    General appearance: appears younger than stated age; mild pale appearance Head: Normocephalic, without obvious abnormality, atraumatic Eyes: conjunctivae/corneas clear. PERRL, EOM's intact. Scleral pallor Nose: Nares normal. Septum midline. Mucosa normal. No drainage or sinus tenderness. Throat: lips, mucosa, and tongue normal; teeth and gums normal Neck: no adenopathy, no carotid bruit, no JVD and thyroid not enlarged, symmetric, no tenderness/mass/nodules Resp: clear to auscultation bilaterally Cardio: irregularly irregular rhythm GI: soft, non-tender; bowel sounds normal; no masses,  no organomegaly Extremities: extremities normal, atraumatic, no cyanosis or edema Pulses: 2+ and symmetric Lymph nodes: Cervical adenopathy: no cervical lymphadenopathy Neurologic: Alert and oriented X 3, normal strength and tone. Normal symmetric reflexes.     Labs on Admission:   Recent Labs  02/21/13 1521  NA 135  K 4.7  CL 100  CO2 21  GLUCOSE 99  BUN 32*  CREATININE 1.65*  CALCIUM 9.1     Recent Labs  02/21/13 1521  WBC 5.5  NEUTROABS 4.0  HGB 6.6*  HCT 20.7*  MCV 83.1  PLT 199    Recent Labs  02/21/13 1521  TROPONINI <0.30   Lab Results  Component Value Date   INR 1.16 10/13/2012   INR 1.06 01/18/2009   No results found for this basename: TSH, T4TOTAL, FREET3, T3FREE, THYROIDAB,  in the last 72 hours No results found for this basename: VITAMINB12, FOLATE, FERRITIN, TIBC, IRON, RETICCTPCT,  in the last 72 hours  Radiological Exams on Admission: No results found. Orders placed during the hospital encounter of 02/21/13  . EKG 12-LEAD- atrial fibrillation.  No acute ST changes.     Assessment/Plan Principal Problem: 1.  Near syncope/generalized weakness secondary to Anemia with heme positive stool- profound weakness is likely secondary to her significant anemia. Heme positive stool and a mildly elevated BUN is suggestive of upper GI blood loss in the setting of anticoagulation. She is receiving her first of 2 units packed red blood cells. We'll check post transfusion CBC and transfuse to keep hemoglobin above at least 8.5. Hold aspirin and Eliquis.  GI consult to consider EGD to help with guidance on restarting antiplatelet and anticoagulant therapy.  Empiric PPI though no other GI symptoms to suggest PUD.     Active Problems: 2. Hypothyroidism- check TSH. 3. Atrial fibrillation with RVR- hold Eliquis.  Continue Metoprolol. 4. Chronic systolic congestive heart failure- (EF 40%).  Monitor volume status with transfusion with I/Os and daily  weights.   5. Disposition- anticipate discharge to home in 3-4 days if stable.  PT/OT evaluation.  Visente Kirker,W DOUGLAS 02/21/2013, 6:42 PM

## 2013-02-21 NOTE — ED Notes (Signed)
Patient arrived via [facility] from home. Patient states she was using the bathroom and left to go outside when she felt like she had to go again and she had a syncopal episode. It was witnessed, family was present. Patient fell to knee and the down. Denies hitting her head. VSS, A/O.

## 2013-02-22 ENCOUNTER — Encounter (HOSPITAL_COMMUNITY): Payer: Self-pay | Admitting: Physician Assistant

## 2013-02-22 ENCOUNTER — Encounter (HOSPITAL_COMMUNITY)
Admission: EM | Disposition: A | Discharge status: Home / Self Care | Payer: Self-pay | Source: Home / Self Care | Attending: Internal Medicine

## 2013-02-22 DIAGNOSIS — D649 Anemia, unspecified: Secondary | ICD-10-CM

## 2013-02-22 DIAGNOSIS — K922 Gastrointestinal hemorrhage, unspecified: Secondary | ICD-10-CM

## 2013-02-22 HISTORY — PX: ESOPHAGOGASTRODUODENOSCOPY: SHX5428

## 2013-02-22 LAB — CBC
HCT: 24.6 % — ABNORMAL LOW (ref 36.0–46.0)
Hemoglobin: 8.1 g/dL — ABNORMAL LOW (ref 12.0–15.0)
Hemoglobin: 9.3 g/dL — ABNORMAL LOW (ref 12.0–15.0)
MCH: 27.2 pg (ref 26.0–34.0)
MCV: 82.6 fL (ref 78.0–100.0)
RBC: 2.98 MIL/uL — ABNORMAL LOW (ref 3.87–5.11)
RBC: 3.3 MIL/uL — ABNORMAL LOW (ref 3.87–5.11)
WBC: 4.7 10*3/uL (ref 4.0–10.5)

## 2013-02-22 LAB — IRON AND TIBC
Iron: 122 ug/dL (ref 42–135)
TIBC: 448 ug/dL (ref 250–470)
UIBC: 326 ug/dL (ref 125–400)

## 2013-02-22 LAB — BASIC METABOLIC PANEL
CO2: 23 mEq/L (ref 19–32)
Calcium: 8.7 mg/dL (ref 8.4–10.5)
Chloride: 100 mEq/L (ref 96–112)
Glucose, Bld: 89 mg/dL (ref 70–99)
Potassium: 4.3 mEq/L (ref 3.5–5.1)
Sodium: 131 mEq/L — ABNORMAL LOW (ref 135–145)

## 2013-02-22 LAB — TSH: TSH: 0.348 u[IU]/mL — ABNORMAL LOW (ref 0.350–4.500)

## 2013-02-22 LAB — TROPONIN I: Troponin I: <0.3 ng/mL (ref <0.30)

## 2013-02-22 LAB — RETICULOCYTES
RBC.: 2.84 MIL/uL — ABNORMAL LOW (ref 3.87–5.11)
Retic Count, Absolute: 85.2 10*3/uL (ref 19.0–186.0)
Retic Ct Pct: 3 % (ref 0.4–3.1)

## 2013-02-22 LAB — FOLATE: Folate: 19 ng/mL

## 2013-02-22 LAB — PREPARE RBC (CROSSMATCH)

## 2013-02-22 LAB — VITAMIN B12: Vitamin B-12: 314 pg/mL (ref 211–911)

## 2013-02-22 SURGERY — EGD (ESOPHAGOGASTRODUODENOSCOPY)
Anesthesia: Moderate Sedation

## 2013-02-22 MED ORDER — SODIUM CHLORIDE 0.9 % IV SOLN
INTRAVENOUS | Status: DC
  Administered 2013-02-22: 16:00:00 via INTRAVENOUS
  Administered 2013-02-23: 500 mL via INTRAVENOUS

## 2013-02-22 MED ORDER — SODIUM CHLORIDE 0.9 % IV SOLN
INTRAVENOUS | Status: DC
  Administered 2013-02-22: 500 mL via INTRAVENOUS

## 2013-02-22 MED ORDER — ACETAMINOPHEN 325 MG PO TABS: 650.0000 mg | ORAL_TABLET | Freq: Once | ORAL | Status: DC

## 2013-02-22 MED ORDER — BUTAMBEN-TETRACAINE-BENZOCAINE 2-2-14 % EX AERO
INHALATION_SPRAY | CUTANEOUS | Status: DC | PRN
  Administered 2013-02-22: 2 via TOPICAL

## 2013-02-22 MED ORDER — PEG-KCL-NACL-NASULF-NA ASC-C 100 G PO SOLR
1.0000 | Freq: Once | ORAL | Status: AC
  Administered 2013-02-22: 200 g via ORAL
  Filled 2013-02-22: qty 1

## 2013-02-22 MED ORDER — FENTANYL CITRATE 0.05 MG/ML IJ SOLN
INTRAMUSCULAR | Status: AC
  Filled 2013-02-22: qty 2

## 2013-02-22 MED ORDER — MIDAZOLAM HCL 10 MG/2ML IJ SOLN
INTRAMUSCULAR | Status: DC | PRN
  Administered 2013-02-22 (×3): 1 mg via INTRAVENOUS

## 2013-02-22 MED ORDER — FENTANYL CITRATE 0.05 MG/ML IJ SOLN
INTRAMUSCULAR | Status: DC | PRN
  Administered 2013-02-22 (×2): 25 ug via INTRAVENOUS

## 2013-02-22 MED ORDER — MIDAZOLAM HCL 5 MG/ML IJ SOLN
INTRAMUSCULAR | Status: AC
  Filled 2013-02-22: qty 1

## 2013-02-22 NOTE — H&P (View-Only) (Signed)
[facility] Consult: 8:18 AM 02/22/2013  LOS: 1 day    Referring Provider: Dr Lang Snow Primary Care Physician:  Geoffery Lyons, MD Primary Gastroenterologist:  None ever.      Reason for Consultation:  Anemia, normal MCV.  FOB + stool.    HPI: Julie Lara is a 79 y.o. female. History of coronary artery disease, non-ST elevation MI s/p PCI/BMS (7/14), idiopathic cardiomyopathy (ejection fraction 40%), and atrial fibrillation on Eliquis since 09/2012.  She presented to the emergency department yesterday with the complaint of profound weakness. It has accelerated in last few days and had presyncopal episode at home yesterday, unable to stand due to weakness.  Had Hgb of 8.8 three weeks ago compared to 12.2 in July 2014, when she was c/o fatigue and weakness,  and started on po Iron.  In ED Hgb 6.6 and stool dark and FOB +.  No previous EGD or colonoscopy. On 01/14/13 had esophagram for dysphagia eval and it showed esophageal dysmotility without stricture or mucosal lesions.  However she denies choking or having trouble swallowing.   No anorexia, no weight loss, no extremity edema, no nose bleeds, no blood in urine.  Takes no NSAIDs except 81 mg ASA.   Pt probably took her Eliquis yesterday AM, none since.       Past Medical History  Diagnosis Date  . 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 History of thyroidectomy   . Dilated cardiomyopathy   . Aortic insufficiency     mild to moderate  . PAC (premature atrial contraction)     Chronic  . Hypothyroidism   . Idiopathic cardiomyopathy 1999    EF 40% by Echo on 10/13/12  . Goiter   . NSTEMI (non-ST elevated myocardial infarction) July 2014    95% prox RCA s/p BMS  . Atrial fibrillation     Past Surgical History  Procedure  Laterality Date  . Cardiac catheterization    . Thyroidectomy    . Total vaginal hysterectomy      Complete  . Coronary angioplasty with stent placement      BMS-95% prox RCA    Prior to Admission medications   Medication Sig Start Date End Date Taking? Authorizing Provider  apixaban (ELIQUIS) 2.5 MG TABS tablet Take 1 tablet (2.5 mg total) by mouth 2 (two) times daily. 11/13/12  Yes Peter M Martinique, MD  aspirin 81 MG tablet Take 81 mg by mouth daily.   Yes Historical Provider, MD  atorvastatin (LIPITOR) 40 MG tablet Take 1 tablet (40 mg total) by mouth daily at 6 PM. 11/13/12  Yes Peter M Martinique, MD  Cholecalciferol (VITAMIN D PO) Take 1,000 Units by mouth daily.    Yes Historical Provider, MD  iron polysaccharides (FERREX 150) 150 MG capsule Take 150 mg by mouth daily.    Yes Historical Provider, MD  levothyroxine (SYNTHROID, LEVOTHROID) 25 MCG tablet Take 25 mcg by mouth daily before breakfast.   Yes Historical Provider, MD  metoprolol tartrate (LOPRESSOR) 25 MG tablet Take 1/2  tablet twice a day 01/29/13  Yes Peter M Martinique, MD  nitroGLYCERIN (NITROSTAT) 0.4 MG SL tablet Place 1 tablet (0.4 mg total) under the tongue every 5 (five) minutes as needed for chest pain. 10/15/12  Yes Roger A Arguello, PA-C    Scheduled Meds: . atorvastatin  40 mg Oral q1800  . cholecalciferol  1,000 Units Oral Daily  . docusate sodium  100 mg Oral BID  . iron polysaccharides  150 mg Oral Daily  . levothyroxine  25 mcg Oral QAC breakfast  . metoprolol tartrate  25 mg Oral BID  . pantoprazole  40 mg Oral BID AC  . sodium chloride  3 mL Intravenous Q12H   Infusions:   PRN Meds: acetaminophen, acetaminophen, alum & mag hydroxide-simeth, nitroGLYCERIN, ondansetron (ZOFRAN) IV, ondansetron   Allergies as of 02/21/2013  . (No Known Allergies)    Family History Dad died of unknown type of cancer No known ulcers or colorectal disease  History   Social History  . Marital Status: Widowed    Spouse  Name: N/A    Number of Children: N/A  . Years of Education: N/A   Occupational History  . Retired Astronomer [employer]   Social History Main Topics  . Smoking status: Never Smoker   . Smokeless tobacco: Never Used  . Alcohol Use: No  . Drug Use: No  . Sexual Activity: Not Currently   Other Topics Concern  . Not on file   Social History Narrative   Pt lives alone, but has family close by.    REVIEW OF SYSTEMS: slleping ok at home. No weight fluctuation No big bruises.  + nasal congestion Never transfused before, no remote hx of anemia.  Still drives a car Does not take flu shots No hx of problems with liver tests.    PHYSICAL EXAM: Vital signs in last 24 hours: Filed Vitals:   02/22/13 0518  BP: 125/35  Pulse: 64  Temp: 97.8 F (36.6 C)  Resp: 20   Wt Readings from Last 3 Encounters:  02/22/13 61.5 kg (135 lb 9.3 oz)  12/15/12 63.866 kg (140 lb 12.8 oz)  11/13/12 60.3 kg (132 lb 15 oz)   General: pleasant, alert elderly WF who is comfortable.  Does not look unwell Head:  No asymmetry or swelling  Eyes:  No icterus , + pallor Ears:  Not HOH  Nose:  No discharge, no sneeaing or congestion Mouth:  Moist and clear, full dentures Neck:  No mass, no JVD, no goiter Lungs:  Clear, unlabored breathing Heart: RRR with no MRG.  NSR on tele monitor Abdomen:  Soft, NT, ND, non obese.  No bruits, no masses.   Rectal: deferred.  FOB + stool yesterday   Musc/Skeltl: no joint contractures Extremities:  No CCE.  3 + pedal pulses bil  Neurologic:  No tremor, somewhat HOH.  No limb weakness.  Oriented x 3.  Skin:  No telangectasia, no sores, no significant bruising. Tattoos:  none Nodes:  No cervical adenopathy   Psych:  Pleasant, cooperative, relaxed.   Intake/Output from previous day: 11/22 0701 - 11/23 0700 In: 437.5 [P.O.:100; Blood:337.5] Out: 250 [Urine:250] Intake/Output this shift:    LAB RESULTS:  Recent Labs  02/21/13 1521 02/22/13 0345  WBC 5.5 5.3    HGB 6.6* 8.1*  HCT 20.7* 24.6*  PLT 199 174   BMET Lab Results  Component Value Date   NA 131* 02/22/2013   NA 135 02/21/2013   NA 138 11/19/2012  K 4.3 02/22/2013   K 4.7 02/21/2013   K 4.2 11/19/2012   CL 100 02/22/2013   CL 100 02/21/2013   CL 105 11/19/2012   CO2 23 02/22/2013   CO2 21 02/21/2013   CO2 27 11/19/2012   GLUCOSE 89 02/22/2013   GLUCOSE 99 02/21/2013   GLUCOSE 86 11/19/2012   BUN 29* 02/22/2013   BUN 32* 02/21/2013   BUN 25* 11/19/2012   CREATININE 1.39* 02/22/2013   CREATININE 1.65* 02/21/2013   CREATININE 1.3* 11/19/2012   CALCIUM 8.7 02/22/2013   CALCIUM 9.1 02/21/2013   CALCIUM 9.3 11/19/2012   LFT No results found for this basename: PROT, ALBUMIN, AST, ALT, ALKPHOS, BILITOT, BILIDIR, IBILI,  in the last 72 hours PT/INR Lab Results  Component Value Date   INR 1.16 10/13/2012   INR 1.06 01/18/2009    Drugs of Abuse  No results found for this basename: labopia, cocainscrnur, labbenz, amphetmu, thcu, labbarb     RADIOLOGY STUDIES: No results found.  ENDOSCOPIC STUDIES: None ever  IMPRESSION:   *  Normocytic anemia.  Hgb drop of almost 6 grams in last 4 months. S/p 2 units PRBCs with good response.   *  FOB + stool.   *  S/p PCI/BM stent placement 716/14.  On Eliquis since then.  EF 40% then with dilated CM.  No cardiac decompensation currently  troponins negative x 3.   *  A fib with RVR 09/2012. In NSR currently.   * Stage 3 CKD    PLAN:     *  For EGD today.    Azucena Freed  02/22/2013, 8:18 AM Pager: [phone]     ________________________________________________________________________  Velora Heckler GI MD note:  I personally examined the patient, reviewed the data and agree with the assessment and plan described above.  I agree.  Signfiicant IDA, recent dark stools that are FOB +.  On eloquis for past 3-4 months.  Planning on EGD today and if no clear explanation then colonoscopy tomorrow.   Owens Loffler, MD [facility]  Gastroenterology Pager [phone]

## 2013-02-22 NOTE — ED Provider Notes (Signed)
I have personally seen and examined the patient.  I have discussed the plan of care with the resident.  I have reviewed the documentation on PMH/FH/Soc. History.  I have reviewed the documentation of the resident and agree.  I have reviewed and agree with the ECG interpretation(s) documented by the resident.  Pt stable in the ED but high risk for worsening GI bleed advised admission Pt agreeable No distress on my evaluation  Sharyon Cable, MD 02/22/13 1723

## 2013-02-22 NOTE — Progress Notes (Signed)
Subjective: Tolerated 2 units of blood. No shortness of breath, orthopnea or edema.  Anticipating EGD.  Objective: Vital signs in last 24 hours: Temp:  [97.5 F (36.4 C)-98.7 F (37.1 C)] 97.8 F (36.6 C) (11/23 0518) Pulse Rate:  [30-99] 64 (11/23 0518) Resp:  [15-25] 20 (11/23 0518) BP: (113-161)/(35-105) 125/35 mmHg (11/23 0518) SpO2:  [91 %-100 %] 99 % (11/23 0518) Weight:  [61.5 kg (135 lb 9.3 oz)-61.78 kg (136 lb 3.2 oz)] 61.5 kg (135 lb 9.3 oz) (11/23 0518) Weight change:  Last BM Date: 02/21/13  CBG (last 3)  No results found for this basename: GLUCAP,  in the last 72 hours  Intake/Output from previous day: 11/22 0701 - 11/23 0700 In: 437.5 [P.O.:100; Blood:337.5] Out: 250 [Urine:250] Intake/Output this shift: Total I/O In: 240 [P.O.:240] Out: -   General appearance: alert and no distress Eyes: no scleral icterus Throat: oropharynx moist without erythema Resp: clear to auscultation bilaterally Cardio: irregularly irregular rhythm GI: soft, non-tender; bowel sounds normal; no masses,  no organomegaly Extremities: no clubbing, cyanosis or edema   Lab Results:  Recent Labs  02/21/13 1521 02/22/13 0345  NA 135 131*  K 4.7 4.3  CL 100 100  CO2 21 23  GLUCOSE 99 89  BUN 32* 29*  CREATININE 1.65* 1.39*  CALCIUM 9.1 8.7   No results found for this basename: AST, ALT, ALKPHOS, BILITOT, PROT, ALBUMIN,  in the last 72 hours  Recent Labs  02/21/13 1521 02/22/13 0345  WBC 5.5 5.3  NEUTROABS 4.0  --   HGB 6.6* 8.1*  HCT 20.7* 24.6*  MCV 83.1 82.6  PLT 199 174   Lab Results  Component Value Date   INR 1.16 10/13/2012   INR 1.06 01/18/2009    Recent Labs  02/21/13 2026 02/22/13 0338 02/22/13 0902  TROPONINI <0.30 <0.30 <0.30    Recent Labs  02/21/13 2026  TSH 0.348*    Recent Labs  02/21/13 2337  RETICCTPCT 3.0    Studies/Results: No results found.   Medications: Scheduled: . atorvastatin  40 mg Oral q1800  . cholecalciferol   1,000 Units Oral Daily  . docusate sodium  100 mg Oral BID  . iron polysaccharides  150 mg Oral Daily  . levothyroxine  25 mcg Oral QAC breakfast  . metoprolol tartrate  25 mg Oral BID  . pantoprazole  40 mg Oral BID AC  . sodium chloride  3 mL Intravenous Q12H   Continuous:   Assessment/Plan: Principal Problem: 1. Anemia secondary to presumed upper GI blood loss- Appreciate GI evaluation.  Anticipate EGD today for evaluation.  Continue to hold ASA and Eliquis.  Hg increased from 6.6-->8.1  Will transfuse 1 additional unit to keep Hg above 8.5-9.0 in setting of CAD.  Continue empiric PPI.  Active Problems: 2. Near syncope- secondary to anemia.  Monitor for hypotension/orthostasis. 3. Hypothyroidism- TSH slightly low.  Continue current synthroid dose. 4. Atrial fibrillation with RVR- continue Metoprolol.  Anticoagulation on hold due to bleeding.   5. Acute on chronic renal failure- improved with transfusion consistent with prerenal azotemia. 6. Chronic systolic congestive heart failure- stable.  Monitor with transfusion.  No indication for diuretics at this time. 7. Coronary artery disease with history of myocardial infarction without history of CABG- given recent PCI (with bare-metal stent in 7/14) it will be helpful to resume ASA asap once bleeding resolved. 8. Disposition- possible discharge in 1-2 days if Hg stable.   LOS: 1 day   Julie Lara,Julie Lara 02/22/2013, 9:56  AM

## 2013-02-22 NOTE — Progress Notes (Signed)
Endoscopy completed.  Remains on clear liquids and tolerating without incident.  IV site unremarkable.  Family member at bedside.  Blood infusing at this time.

## 2013-02-22 NOTE — Interval H&P Note (Signed)
History and Physical Interval Note:  02/22/2013 11:21 AM  Julie Lara  has presented today for surgery, with the diagnosis of FOB + stool and anemia.  The various methods of treatment have been discussed with the patient and family. After consideration of risks, benefits and other options for treatment, the patient has consented to  Procedure(s): ESOPHAGOGASTRODUODENOSCOPY (EGD) (N/A) as a surgical intervention .  The patient's history has been reviewed, patient examined, no change in status, stable for surgery.  I have reviewed the patient's chart and labs.  Questions were answered to the patient's satisfaction.     Milus Banister

## 2013-02-22 NOTE — Op Note (Signed)
[facility] [place], [address]   ENDOSCOPY PROCEDURE REPORT  PATIENT: Julie, Lara  MR#: [id number] BIRTHDATE: 1920/07/06 , 89  yrs. old GENDER: Female ENDOSCOPIST: Milus Banister, MD REFERRED BY:  Alva Garnet, M.D. PROCEDURE DATE:  02/22/2013 PROCEDURE:  EGD, diagnostic ASA CLASS:     Class IV INDICATIONS:  hemocult +, normocytic anemia; worsening since started eloquis 4 months ago. MEDICATIONS: Fentanyl 50 mcg IV and Versed 3 mg IV TOPICAL ANESTHETIC: Cetacaine Spray  DESCRIPTION OF PROCEDURE: After the risks benefits and alternatives of the procedure were thoroughly explained, informed consent was obtained.  The PENTAX GASTOROSCOPE [id number] endoscope was introduced through the mouth and advanced to the second portion of the duodenum. Without limitations.  The instrument was slowly withdrawn as the mucosa was fully examined.    The upper, middle and distal third of the esophagus were carefully inspected and no abnormalities were noted.  The z-line was well seen at the GEJ.  The endoscope was pushed into the fundus which was normal including a retroflexed view.  The antrum, gastric body, first and second part of the duodenum were unremarkable. Retroflexed views revealed no abnormalities.     The scope was then withdrawn from the patient and the procedure completed. COMPLICATIONS: There were no complications.  ENDOSCOPIC IMPRESSION: Normal EGD  RECOMMENDATIONS: This test does not explain her hemocult + anemia and I recommended colonoscopy which she has never had.  Will plan for this to be done tomorrow.  For now, continue to hold eloquis and ASA.   eSigned:  Milus Banister, MD 02/22/2013 11:46 AM

## 2013-02-22 NOTE — Progress Notes (Signed)
PT Cancellation Note  Patient Details Name: Julie Lara MRN: [id number] DOB: 1920-09-22   Cancelled Treatment:    Reason Eval/Treat Not Completed: Patient at procedure or test/unavailable;Medical issues which prohibited therapy.  Patient at procedure - endoscopy.  Hgb 6.6.  Will hold PT today and return tomorrow for PT evaluation.   Despina Pole 02/22/2013, 11:26 AM Carita Pian. Sanjuana Kava, [place] Pager [phone]

## 2013-02-22 NOTE — Consult Note (Signed)
[facility] Consult: 8:18 AM 02/22/2013  LOS: 1 day    Referring Provider: Dr Lang Snow Primary Care Physician:  Geoffery Lyons, MD Primary Gastroenterologist:  None ever.      Reason for Consultation:  Anemia, normal MCV.  FOB + stool.    HPI: Julie Lara is a 79 y.o. female. History of coronary artery disease, non-ST elevation MI s/p PCI/BMS (7/14), idiopathic cardiomyopathy (ejection fraction 40%), and atrial fibrillation on Eliquis since 09/2012.  She presented to the emergency department yesterday with the complaint of profound weakness. It has accelerated in last few days and had presyncopal episode at home yesterday, unable to stand due to weakness.  Had Hgb of 8.8 three weeks ago compared to 12.2 in July 2014, when she was c/o fatigue and weakness,  and started on po Iron.  In ED Hgb 6.6 and stool dark and FOB +.  No previous EGD or colonoscopy. On 01/14/13 had esophagram for dysphagia eval and it showed esophageal dysmotility without stricture or mucosal lesions.  However she denies choking or having trouble swallowing.   No anorexia, no weight loss, no extremity edema, no nose bleeds, no blood in urine.  Takes no NSAIDs except 81 mg ASA.   Pt probably took her Eliquis yesterday AM, none since.       Past Medical History  Diagnosis Date  . 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 History of thyroidectomy   . Dilated cardiomyopathy   . Aortic insufficiency     mild to moderate  . PAC (premature atrial contraction)     Chronic  . Hypothyroidism   . Idiopathic cardiomyopathy 1999    EF 40% by Echo on 10/13/12  . Goiter   . NSTEMI (non-ST elevated myocardial infarction) July 2014    95% prox RCA s/p BMS  . Atrial fibrillation     Past Surgical History  Procedure  Laterality Date  . Cardiac catheterization    . Thyroidectomy    . Total vaginal hysterectomy      Complete  . Coronary angioplasty with stent placement      BMS-95% prox RCA    Prior to Admission medications   Medication Sig Start Date End Date Taking? Authorizing Provider  apixaban (ELIQUIS) 2.5 MG TABS tablet Take 1 tablet (2.5 mg total) by mouth 2 (two) times daily. 11/13/12  Yes Peter M Martinique, MD  aspirin 81 MG tablet Take 81 mg by mouth daily.   Yes Historical Provider, MD  atorvastatin (LIPITOR) 40 MG tablet Take 1 tablet (40 mg total) by mouth daily at 6 PM. 11/13/12  Yes Peter M Martinique, MD  Cholecalciferol (VITAMIN D PO) Take 1,000 Units by mouth daily.    Yes Historical Provider, MD  iron polysaccharides (FERREX 150) 150 MG capsule Take 150 mg by mouth daily.    Yes Historical Provider, MD  levothyroxine (SYNTHROID, LEVOTHROID) 25 MCG tablet Take 25 mcg by mouth daily before breakfast.   Yes Historical Provider, MD  metoprolol tartrate (LOPRESSOR) 25 MG tablet Take 1/2  tablet twice a day 01/29/13  Yes Peter M Martinique, MD  nitroGLYCERIN (NITROSTAT) 0.4 MG SL tablet Place 1 tablet (0.4 mg total) under the tongue every 5 (five) minutes as needed for chest pain. 10/15/12  Yes Roger A Arguello, PA-C    Scheduled Meds: . atorvastatin  40 mg Oral q1800  . cholecalciferol  1,000 Units Oral Daily  . docusate sodium  100 mg Oral BID  . iron polysaccharides  150 mg Oral Daily  . levothyroxine  25 mcg Oral QAC breakfast  . metoprolol tartrate  25 mg Oral BID  . pantoprazole  40 mg Oral BID AC  . sodium chloride  3 mL Intravenous Q12H   Infusions:   PRN Meds: acetaminophen, acetaminophen, alum & mag hydroxide-simeth, nitroGLYCERIN, ondansetron (ZOFRAN) IV, ondansetron   Allergies as of 02/21/2013  . (No Known Allergies)    Family History Dad died of unknown type of cancer No known ulcers or colorectal disease  History   Social History  . Marital Status: Widowed    Spouse  Name: N/A    Number of Children: N/A  . Years of Education: N/A   Occupational History  . Retired Astronomer [employer]   Social History Main Topics  . Smoking status: Never Smoker   . Smokeless tobacco: Never Used  . Alcohol Use: No  . Drug Use: No  . Sexual Activity: Not Currently   Other Topics Concern  . Not on file   Social History Narrative   Pt lives alone, but has family close by.    REVIEW OF SYSTEMS: slleping ok at home. No weight fluctuation No big bruises.  + nasal congestion Never transfused before, no remote hx of anemia.  Still drives a car Does not take flu shots No hx of problems with liver tests.    PHYSICAL EXAM: Vital signs in last 24 hours: Filed Vitals:   02/22/13 0518  BP: 125/35  Pulse: 64  Temp: 97.8 F (36.6 C)  Resp: 20   Wt Readings from Last 3 Encounters:  02/22/13 61.5 kg (135 lb 9.3 oz)  12/15/12 63.866 kg (140 lb 12.8 oz)  11/13/12 60.3 kg (132 lb 15 oz)   General: pleasant, alert elderly WF who is comfortable.  Does not look unwell Head:  No asymmetry or swelling  Eyes:  No icterus , + pallor Ears:  Not HOH  Nose:  No discharge, no sneeaing or congestion Mouth:  Moist and clear, full dentures Neck:  No mass, no JVD, no goiter Lungs:  Clear, unlabored breathing Heart: RRR with no MRG.  NSR on tele monitor Abdomen:  Soft, NT, ND, non obese.  No bruits, no masses.   Rectal: deferred.  FOB + stool yesterday   Musc/Skeltl: no joint contractures Extremities:  No CCE.  3 + pedal pulses bil  Neurologic:  No tremor, somewhat HOH.  No limb weakness.  Oriented x 3.  Skin:  No telangectasia, no sores, no significant bruising. Tattoos:  none Nodes:  No cervical adenopathy   Psych:  Pleasant, cooperative, relaxed.   Intake/Output from previous day: 11/22 0701 - 11/23 0700 In: 437.5 [P.O.:100; Blood:337.5] Out: 250 [Urine:250] Intake/Output this shift:    LAB RESULTS:  Recent Labs  02/21/13 1521 02/22/13 0345  WBC 5.5 5.3    HGB 6.6* 8.1*  HCT 20.7* 24.6*  PLT 199 174   BMET Lab Results  Component Value Date   NA 131* 02/22/2013   NA 135 02/21/2013   NA 138 11/19/2012  K 4.3 02/22/2013   K 4.7 02/21/2013   K 4.2 11/19/2012   CL 100 02/22/2013   CL 100 02/21/2013   CL 105 11/19/2012   CO2 23 02/22/2013   CO2 21 02/21/2013   CO2 27 11/19/2012   GLUCOSE 89 02/22/2013   GLUCOSE 99 02/21/2013   GLUCOSE 86 11/19/2012   BUN 29* 02/22/2013   BUN 32* 02/21/2013   BUN 25* 11/19/2012   CREATININE 1.39* 02/22/2013   CREATININE 1.65* 02/21/2013   CREATININE 1.3* 11/19/2012   CALCIUM 8.7 02/22/2013   CALCIUM 9.1 02/21/2013   CALCIUM 9.3 11/19/2012   LFT No results found for this basename: PROT, ALBUMIN, AST, ALT, ALKPHOS, BILITOT, BILIDIR, IBILI,  in the last 72 hours PT/INR Lab Results  Component Value Date   INR 1.16 10/13/2012   INR 1.06 01/18/2009    Drugs of Abuse  No results found for this basename: labopia, cocainscrnur, labbenz, amphetmu, thcu, labbarb     RADIOLOGY STUDIES: No results found.  ENDOSCOPIC STUDIES: None ever  IMPRESSION:   *  Normocytic anemia.  Hgb drop of almost 6 grams in last 4 months. S/p 2 units PRBCs with good response.   *  FOB + stool.   *  S/p PCI/BM stent placement 716/14.  On Eliquis since then.  EF 40% then with dilated CM.  No cardiac decompensation currently  troponins negative x 3.   *  A fib with RVR 09/2012. In NSR currently.   * Stage 3 CKD    PLAN:     *  For EGD today.    Azucena Freed  02/22/2013, 8:18 AM Pager: [phone]     ________________________________________________________________________  Velora Heckler GI MD note:  I personally examined the patient, reviewed the data and agree with the assessment and plan described above.  I agree.  Signfiicant IDA, recent dark stools that are FOB +.  On eloquis for past 3-4 months.  Planning on EGD today and if no clear explanation then colonoscopy tomorrow.   Owens Loffler, MD [facility]  Gastroenterology Pager [phone]

## 2013-02-23 ENCOUNTER — Encounter (HOSPITAL_COMMUNITY): Payer: Self-pay | Admitting: Gastroenterology

## 2013-02-23 ENCOUNTER — Encounter (HOSPITAL_COMMUNITY)
Admission: EM | Disposition: A | Discharge status: Home / Self Care | Payer: Self-pay | Source: Home / Self Care | Attending: Internal Medicine

## 2013-02-23 DIAGNOSIS — D126 Benign neoplasm of colon, unspecified: Secondary | ICD-10-CM

## 2013-02-23 DIAGNOSIS — R195 Other fecal abnormalities: Secondary | ICD-10-CM

## 2013-02-23 HISTORY — PX: COLONOSCOPY: SHX5424

## 2013-02-23 HISTORY — DX: Other fecal abnormalities: R19.5

## 2013-02-23 LAB — BASIC METABOLIC PANEL
CO2: 21 mEq/L (ref 19–32)
Calcium: 8.6 mg/dL (ref 8.4–10.5)
GFR calc Af Amer: 38 mL/min — ABNORMAL LOW (ref >90)
Glucose, Bld: 82 mg/dL (ref 70–99)
Potassium: 4.1 mEq/L (ref 3.5–5.1)
Sodium: 140 mEq/L (ref 135–145)

## 2013-02-23 LAB — TYPE AND SCREEN
ABO/RH(D): B POS
Antibody Screen: NEGATIVE
Unit division: 0
Unit division: 0

## 2013-02-23 LAB — CBC
Hemoglobin: 9.1 g/dL — ABNORMAL LOW (ref 12.0–15.0)
MCH: 27.9 pg (ref 26.0–34.0)
MCV: 83.4 fL (ref 78.0–100.0)
RBC: 3.26 MIL/uL — ABNORMAL LOW (ref 3.87–5.11)
RDW: 14.7 % (ref 11.5–15.5)
WBC: 4.6 10*3/uL (ref 4.0–10.5)

## 2013-02-23 SURGERY — COLONOSCOPY
Anesthesia: Moderate Sedation

## 2013-02-23 MED ORDER — FENTANYL CITRATE 0.05 MG/ML IJ SOLN
INTRAMUSCULAR | Status: AC
  Filled 2013-02-23: qty 2

## 2013-02-23 MED ORDER — MIDAZOLAM HCL 5 MG/ML IJ SOLN
INTRAMUSCULAR | Status: AC
  Filled 2013-02-23: qty 2

## 2013-02-23 MED ORDER — FENTANYL CITRATE 0.05 MG/ML IJ SOLN
INTRAMUSCULAR | Status: DC | PRN
  Administered 2013-02-23 (×2): 25 ug via INTRAVENOUS

## 2013-02-23 MED ORDER — MIDAZOLAM HCL 5 MG/5ML IJ SOLN
INTRAMUSCULAR | Status: DC | PRN
  Administered 2013-02-23: 2 mg via INTRAVENOUS
  Administered 2013-02-23: 1 mg via INTRAVENOUS
  Administered 2013-02-23: 2 mg via INTRAVENOUS

## 2013-02-23 NOTE — Progress Notes (Signed)
OT Cancellation Note  Patient Details Name: Julie Lara MRN: [id number] DOB: 08/14/1920   Cancelled Treatment:    Reason Eval/Treat Not Completed: Patient at procedure or test/ unavailable. Will re attempt later today as time allows  Britt Bottom 02/23/2013, 11:36 AM

## 2013-02-23 NOTE — Interval H&P Note (Signed)
History and Physical Interval Note:  02/23/2013 11:20 AM  Julie Lara  has presented today for surgery, with the diagnosis of FOB + stools, signficant anemia  The various methods of treatment have been discussed with the patient and family. After consideration of risks, benefits and other options for treatment, the patient has consented to  Procedure(s): COLONOSCOPY (N/A) as a surgical intervention .  The patient's history has been reviewed, patient examined, no change in status, stable for surgery.  I have reviewed the patient's chart and labs.  Questions were answered to the patient's satisfaction.     Pricilla Riffle. Fuller Plan MD Marval Regal

## 2013-02-23 NOTE — Progress Notes (Signed)
Patient completed bowel prep for colonoscopy. Stool clear liquid.

## 2013-02-23 NOTE — Progress Notes (Signed)
Capsule endoscopy set for 03/09/13 8 AM at [facility]. Reviewed with her family. Has pre procedure visit set for 03/02/13 at 2 PM.   This info was placed in the provider navigator and will print up with discharge paperwork.   Azucena Freed, PA-C

## 2013-02-23 NOTE — Evaluation (Signed)
Physical Therapy Evaluation Patient Details Name: Julie Lara MRN: [id number] DOB: 1920/12/19 Today's Date: 02/23/2013 Time: 1421-1440 PT Time Calculation (min): 19 min  PT Assessment / Plan / Recommendation History of Present Illness  pt presents with Anemia and GIB.    Clinical Impression  Pt indicates still feeling drowsy after colonoscopy, but wanted to try to ambulate.  Pt mildly unsteady and anticipate will mobilize better once she has cleared completely.  Pt requesting to wash up.  Pt set-up with basin to wash at EOB.  RN made aware.  Will f/u to ensure safety.      PT Assessment  Patient needs continued PT services    Follow Up Recommendations  Home health PT;Supervision - Intermittent    Does the patient have the potential to tolerate intense rehabilitation      Barriers to Discharge        Equipment Recommendations  Rolling walker with 5" wheels    Recommendations for Other Services     Frequency Min 3X/week    Precautions / Restrictions Precautions Precautions: Fall Restrictions Weight Bearing Restrictions: No   Pertinent Vitals/Pain Denied pain.        Mobility  Bed Mobility Bed Mobility: Supine to Sit;Sitting - Scoot to Edge of Bed Supine to Sit: 5: Supervision;With rails;HOB elevated Sitting - Scoot to Edge of Bed: 5: Supervision Details for Bed Mobility Assistance: pt moves slowly and utilizes rails.   Transfers Transfers: Sit to Stand;Stand to Sit Sit to Stand: 4: Min guard;With upper extremity assist;From bed Stand to Sit: 4: Min guard;With upper extremity assist;To bed Details for Transfer Assistance: pt demos good use of UEs, but mildly unsteady.   Ambulation/Gait Ambulation/Gait Assistance: 4: Min guard;4: Min Wellsite geologist (Feet): 25 Feet Assistive device: None Ambulation/Gait Assistance Details: Occasional MinA for balance.  pt notes still feeling drowsy after colonoscopy, but wanted to try to ambulate.   Gait Pattern:  Step-through pattern;Decreased stride length Stairs: No Wheelchair Mobility Wheelchair Mobility: No    Exercises     PT Diagnosis: Difficulty walking  PT Problem List: Decreased strength;Decreased activity tolerance;Decreased balance;Decreased mobility;Decreased knowledge of use of DME PT Treatment Interventions: DME instruction;Gait training;Stair training;Functional mobility training;Therapeutic activities;Therapeutic exercise;Balance training;Patient/family education     PT Goals(Current goals can be found in the care plan section) Acute Rehab PT Goals Patient Stated Goal: Home PT Goal Formulation: With patient Time For Goal Achievement: 03/02/13 Potential to Achieve Goals: Good  Visit Information  Last PT Received On: 02/23/13 Assistance Needed: +1 History of Present Illness: pt presents with Anemia and GIB.         Prior [place] expects to be discharged to:: Private residence Living Arrangements: Alone Available Help at Discharge: Family;Available PRN/intermittently Type of Home: House Home Access: Stairs to enter [employer] of Steps: 1 Entrance Stairs-Rails: None Home Layout: One level Home Equipment: None Prior Function Level of Independence: Independent Comments: Family helps with driving and yard work.  pt indicates she can cook, but goes to her daughter's house often for dinner.   Communication Communication: No difficulties    Cognition  Cognition Arousal/Alertness: Awake/alert Behavior During Therapy: WFL for tasks assessed/performed Overall Cognitive Status: Within Functional Limits for tasks assessed    Extremity/Trunk Assessment Upper Extremity Assessment Upper Extremity Assessment: Generalized weakness Lower Extremity Assessment Lower Extremity Assessment: Generalized weakness   Balance    End of Session PT - End of Session Equipment Utilized During Treatment: Gait belt Activity Tolerance: Patient  limited  by fatigue Patient left: in bed;with call bell/phone within reach Nurse Communication: Mobility status  GP     Catarina Hartshorn, [place] 02/23/2013, 3:07 PM

## 2013-02-23 NOTE — Progress Notes (Signed)
Subjective: Well, smiles No chestpain, no dyspnea, stronger Await colon-prep complete  Objective: Vital signs in last 24 hours: Temp:  [97.2 F (36.2 C)-98.3 F (36.8 C)] 98.3 F (36.8 C) (11/24 [id number]) Pulse Rate:  [57-138] 81 (11/24 0614) Resp:  [16-26] 18 (11/24 0614) BP: (125-187)/(47-97) 129/63 mmHg (11/24 0614) SpO2:  [91 %-100 %] 97 % (11/24 0614) Weight:  [61.2 kg (134 lb 14.7 oz)-61.236 kg (135 lb)] 61.2 kg (134 lb 14.7 oz) (11/24 [id number]) Weight change: -0.544 kg (-1 lb 3.2 oz)  CBG (last 3)  No results found for this basename: GLUCAP,  in the last 72 hours  Intake/Output from previous day: 11/23 0701 - 11/24 0700 In: 885 [P.O.:460; I.V.:100; Blood:325] Out: 800 [Urine:800]  Physical Exam:  General appearance: alert and no distress  Eyes: no scleral icterus  Throat: oropharynx moist without erythema  Resp: clear to auscultation bilaterally  Cardio: irregularly irregular rhythm  GI: soft, non-tender; bowel sounds normal; no masses, no organomegaly  Extremities: no clubbing, cyanosis or edema      Lab Results:  Recent Labs  02/22/13 0345 02/23/13 0545  NA 131* 140  K 4.3 4.1  CL 100 109  CO2 23 21  GLUCOSE 89 82  BUN 29* 21  CREATININE 1.39* 1.37*  CALCIUM 8.7 8.6   No results found for this basename: AST, ALT, ALKPHOS, BILITOT, PROT, ALBUMIN,  in the last 72 hours  Recent Labs  02/21/13 1521  02/22/13 1803 02/23/13 0545  WBC 5.5  < > 4.7 4.6  NEUTROABS 4.0  --   --   --   HGB 6.6*  < > 9.3* 9.1*  HCT 20.7*  < > 26.9* 27.2*  MCV 83.1  < > 81.5 83.4  PLT 199  < > 169 168  < > = values in this interval not displayed. Lab Results  Component Value Date   INR 1.16 10/13/2012   INR 1.06 01/18/2009    Recent Labs  02/21/13 2026 02/22/13 0338 02/22/13 0902  TROPONINI <0.30 <0.30 <0.30    Recent Labs  02/21/13 2026  TSH 0.348*    Recent Labs  02/21/13 2337  VITAMINB12 314  FOLATE 19.0  FERRITIN 8*  TIBC 448  IRON 122  RETICCTPCT  3.0    Studies/Results: No results found.   Assessment/Plan: 1. Anemia due to acute gi blood loss- hgb stable, colonoscopy today 2. Afib- rate controlled, not anticoagulation candidate at this time 3. Acute renal failure-resolved 3. CKD3-stable 5. CHF- compensated 6. CAD/MI/PCI- stable  dis[po pending gi w/u   LOS: 2 days   Julie Lara A 02/23/2013, 8:23 AM

## 2013-02-23 NOTE — Progress Notes (Signed)
Report given to receiving RN. Patient is stable with no verbal complaints. No apparent signs or symptoms of distress or discomfort.

## 2013-02-23 NOTE — Op Note (Signed)
[facility] [place], [address]   COLONOSCOPY PROCEDURE REPORT PATIENT: Julie Lara, Julie Lara  MR#: [id number] BIRTHDATE: 26-Jun-1920 , 89  yrs. old GENDER: Female ENDOSCOPIST: Ladene Artist, MD, [facility] REFERRED [id number] Reynaldo Minium, M.D. PROCEDURE DATE:  02/23/2013 PROCEDURE:   Colonoscopy with snare polypectomy First Screening Colonoscopy - Avg.  risk and is 50 yrs.  old or older - No.  Prior Negative Screening - Now for repeat screening. N/A  History of Adenoma - Now for follow-up colonoscopy & has been > or = to 3 yrs.  N/A  Polyps Removed Today? Yes. ASA CLASS:   Class III INDICATIONS:Anemia, non-specific and heme-positive stool. MEDICATIONS: medications were titrated to patient response per physician's verbal order, Fentanyl 50 mcg IV, and Versed 5 mg IV DESCRIPTION OF PROCEDURE:   After the risks benefits and alternatives of the procedure were thoroughly explained, informed consent was obtained.  A digital rectal exam revealed no abnormalities of the rectum.   The Pentax Ped Colon [id number] endoscope was introduced through the anus and advanced to the cecum, which was identified by both the appendix and ileocecal valve. No adverse events experienced.   The quality of the prep was good, using MoviPrep  The instrument was then slowly withdrawn as the colon was fully examined.  COLON FINDINGS: A sessile polyp measuring 8 mm in size was found at the cecum.  A polypectomy was performed using snare cautery.  The resection was complete and the polyp tissue was completely retrieved.   Mild diverticulosis was noted in the sigmoid colon. The colon was otherwise normal.  There was no diverticulosis, inflammation, polyps or cancers unless previously stated. Retroflexed views revealed small internal hemorrhoids. The time to cecum=3 minutes 30 seconds.  Withdrawal time=13 minutes 00 seconds. The scope was withdrawn and the procedure  completed. COMPLICATIONS: There were no complications.  ENDOSCOPIC IMPRESSION: 1.   Sessile polyp measuring 8 mm at the cecum; polypectomy performed using snare cautery 2.   Mild diverticulosis in the sigmoid colon 3.   Small internal hemorrhoids  RECOMMENDATIONS: 1.  Hold aspirin, aspirin products, and anti-inflammatory medication for 2 weeks. 2.  Await pathology results 3.  Would wait at least 2 days before resuming Eliquis 4.  Although the polyp is a possible source for heme + stool and anemia in the setting of anticoagulation I would recommend proceeding with a capsule endoscopy as an outpatient with GI follow up Dr. Ardis Hughs.  eSigned:  Ladene Artist, MD, [facility] 02/23/2013 11:50 AM

## 2013-02-23 NOTE — Progress Notes (Signed)
UR completed. Tionne Dayhoff RN CCM Case Mgmt 

## 2013-02-24 ENCOUNTER — Encounter (HOSPITAL_COMMUNITY): Payer: Self-pay | Admitting: Gastroenterology

## 2013-02-24 ENCOUNTER — Encounter: Payer: Self-pay | Admitting: Gastroenterology

## 2013-02-24 ENCOUNTER — Inpatient Hospital Stay (HOSPITAL_COMMUNITY): Payer: Medicare Other

## 2013-02-24 LAB — BASIC METABOLIC PANEL
CO2: 22 mEq/L (ref 19–32)
Calcium: 8.9 mg/dL (ref 8.4–10.5)
Chloride: 102 mEq/L (ref 96–112)
Creatinine, Ser: 1.31 mg/dL — ABNORMAL HIGH (ref 0.50–1.10)
GFR calc Af Amer: 40 mL/min — ABNORMAL LOW (ref >90)
Glucose, Bld: 112 mg/dL — ABNORMAL HIGH (ref 70–99)
Sodium: 135 mEq/L (ref 135–145)

## 2013-02-24 LAB — CBC
HCT: 30.4 % — ABNORMAL LOW (ref 36.0–46.0)
Hemoglobin: 10.1 g/dL — ABNORMAL LOW (ref 12.0–15.0)
MCH: 28 pg (ref 26.0–34.0)
MCV: 84.2 fL (ref 78.0–100.0)
Platelets: 186 10*3/uL (ref 150–400)
RBC: 3.61 MIL/uL — ABNORMAL LOW (ref 3.87–5.11)

## 2013-02-24 MED ORDER — ASPIRIN EC 325 MG PO TBEC
325.0000 mg | DELAYED_RELEASE_TABLET | Freq: Every day | ORAL | Status: DC
  Filled 2013-02-24: qty 1

## 2013-02-24 NOTE — Progress Notes (Signed)
MD paged per family request. Waiting for returned phone call.

## 2013-02-24 NOTE — Progress Notes (Signed)
PT Cancellation Note  Patient Details Name: Julie Lara MRN: [id number] DOB: 1921/01/27   Cancelled Treatment:    Reason Eval/Treat Not Completed: Medical issues which prohibited therapy; x-rays suggestive of avulsion fracture on lateral malleolus and pt continues to report pain.  Will await weight bearing recommendations prior to resuming PT.  Thanks   WYNN,CYNDI 02/24/2013, 10:25 AM

## 2013-02-24 NOTE — Progress Notes (Signed)
    As per GI recs Hold ASA for 2 weeks Restart Eliquis 2.5 mg bid tomorrow.  She is stable from a cardiac standpoint  Dr. Martinique will see as OP.  Call for questions.   Thayer Headings, Brooke Bonito., MD, [facility] 02/24/2013, 4:47 PM Office - [phone] Pager [phone]

## 2013-02-24 NOTE — Progress Notes (Signed)
MD returned phone call. Stated that he will make attending MD aware for tomorrow morning rounds. Will give family the message.

## 2013-02-24 NOTE — Progress Notes (Signed)
Daily Rounding Note  02/24/2013, 10:39 AM  LOS: 3 days   SUBJECTIVE:       No abdominal pain, no nausea.  No black or bloody stool  OBJECTIVE:         Vital signs in last 24 hours:    Temp:  [97.2 F (36.2 C)-98.5 F (36.9 C)] 97.9 F (36.6 C) (11/25 0509) Pulse Rate:  [43-106] 106 (11/25 0959) Resp:  [19-39] 20 (11/25 0509) BP: (128-194)/(54-101) 129/74 mmHg (11/25 0959) SpO2:  [95 %-100 %] 97 % (11/25 0509) Weight:  [62 kg (136 lb 11 oz)] 62 kg (136 lb 11 oz) (11/25 0624) Last BM Date: 02/23/13 General: looks well   Heart: RRR Chest: clear bil.  No labored breathing Abdomen: not tender, not distended, active BS  Extremities: no CCE Neuro/Psych:  Oriented x 3.  Fully alert, no gross deficits.   Intake/Output from previous day: 11/24 0701 - 11/25 0700 In: 700 [P.O.:700] Out: 1350 [Urine:1350]  Intake/Output this shift: Total I/O In: 243 [P.O.:240; I.V.:3] Out: -   Lab Results:  Recent Labs  02/22/13 1803 02/23/13 0545 02/24/13 0520  WBC 4.7 4.6 7.6  HGB 9.3* 9.1* 10.1*  HCT 26.9* 27.2* 30.4*  PLT 169 168 186   BMET  Recent Labs  02/22/13 0345 02/23/13 0545 02/24/13 0520  NA 131* 140 135  K 4.3 4.1 3.9  CL 100 109 102  CO2 23 21 22   GLUCOSE 89 82 112*  BUN 29* 21 20  CREATININE 1.39* 1.37* 1.31*  CALCIUM 8.7 8.6 8.9   Studies/Results: Dg Ankle 2 Views Right  02/24/2013   CLINICAL DATA:  Reported history of trauma  EXAM: RIGHT ANKLE - 2 VIEW  COMPARISON:  None.  FINDINGS: The bones are osteopenic. A crescent-shaped lucency projects along the distal periphery of the lateral malleolus. There is suggestion of mild cortical irregularity along the distal tip of the lateral malleolus and these findings are suspicious for a small avulsion fracture. Chondrocalcinosis is identified within the medial aspect of the ankle mortise. Coarse atherosclerotic calcifications identified within posterior  tibial artery. Chondrocalcinosis also appreciated medially within the ankle mortise. There is a soft tissue swelling within the lateral soft tissues of the ankle.  IMPRESSION: 1. Findings concerning for an avulsion fracture involving the distal lateral malleolus 2. Osteoarthritic changes within the ankle 3. Atherosclerotic disease.   Electronically Signed   By: Margaree Mackintosh M.D.   On: 02/24/2013 08:54    ASSESMENT:   *  Anemia and FOB + stool (dark but not melenic in pt on Iron po) Normal EGD  Sigmoid tics, internal hemorrhoids, 74mm sessile polyp at cecum on colonoscopy Set up for out pt capsule endoscopy in early December.  Hbg improved post transfusions.   *  Atrial fibrillation on Eliquis since 09/2012. Had BM stent placed then.   *  Stage 3 CKD, this may be playing role in her anemia.    PLAN   *  Hold ASA for 2 weeks as or 11/24.  OK to restart Eliquis as indicated 11/26. *  Capsule endo early December *  Will sign off.    Julie Lara  02/24/2013, 10:39 AM Pager: [phone]    Attending physician's note   I have taken an interval history, reviewed the chart and examined the patient. I agree with the Advanced Practitioner's note, impression and recommendations. Hb=10.1 No recurrent bleeding. No GI complaints. For capsule endoscopy as outpatient and follow up  with Dr. Oretha Caprice. GI signing off.   Pricilla Riffle. Fuller Plan, MD [facility]

## 2013-02-24 NOTE — Evaluation (Signed)
Occupational Therapy Evaluation Patient Details Name: Julie Lara MRN: [id number] DOB: 09-27-1920 Today's Date: 02/24/2013 Time: [id number] OT Time Calculation (min): 17 min  OT Assessment / Plan / Recommendation History of present illness pt presents with Anemia and GIB.  Pt also c/o right ankle and foot pain since being in hospital.     Clinical Impression   Pt admitted with above. Will benefit from continued acute OT services to address below problem list.  Eval session limited due to pt awaiting x ray results. Pt states she had an xray this morning for right ankle/foot.  Will await x results before progressing OOB mobility.  Pt also reporting difficulty WB'ing on right foot when using bedside commode earlier this morning.   Pt plans to discharge to daughter's home for several weeks in order to have more assist/supervision.      OT Assessment  Patient needs continued OT Services    Follow Up Recommendations  No OT follow up;Supervision/Assistance - 24 hour    Barriers to Discharge      Equipment Recommendations   (TBD)    Recommendations for Other Services    Frequency  Min 2X/week    Precautions / Restrictions Precautions Precautions: Fall   Pertinent Vitals/Pain Noted swelling on right lateral aspect of ankle.  C/o right ankle/foot pain.    ADL  Eating/Feeding: Performed;Independent Where Assessed - Eating/Feeding: Edge of bed Upper Body Bathing: Simulated;Set up Where Assessed - Upper Body Bathing: Unsupported sitting Lower Body Bathing: Simulated;Supervision/safety Where Assessed - Lower Body Bathing: Unsupported sitting Upper Body Dressing: Simulated;Set up Where Assessed - Upper Body Dressing: Unsupported sitting Lower Body Dressing: Simulated;Minimal assistance Where Assessed - Lower Body Dressing: Unsupported sitting Transfers/Ambulation Related to ADLs: Not attempted at this time. Pt had x ray this morning for right foot pain.  Will await results before  attempting OOB. ADL Comments: Pt's daughter present in room and states that family will provide 24/7 assist as needed.  Pt c/o of right foot/ankle pain, states she had an x ray this morning.     OT Diagnosis: Generalized weakness;Acute pain  OT Problem List: Decreased strength;Decreased activity tolerance;Pain;Decreased knowledge of use of DME or AE OT Treatment Interventions: Self-care/ADL training;DME and/or AE instruction;Therapeutic activities;Patient/family education   OT Goals(Current goals can be found in the care plan section) Acute Rehab OT Goals Patient Stated Goal: Home OT Goal Formulation: With patient Time For Goal Achievement: 03/03/13 Potential to Achieve Goals: Good  Visit Information  Last OT Received On: 02/24/13 Assistance Needed: +1 History of Present Illness: pt presents with Anemia and GIB.         Prior [place] expects to be discharged to:: Private residence Living Arrangements: Alone Available Help at Discharge: Family;Available 24 hours/day Type of Home: House Home Layout: Able to live on main level with bedroom/bathroom Home Equipment: Shower seat Additional Comments: Pt is going to stay at daughter's house for a few weeks.  Daughter states that her son has a RW that pt can use if needed. Prior Function Level of Independence: Independent Comments: Family helps with driving and yard work.  pt indicates she can cook, but goes to her daughter's house often for dinner.           Vision/Perception     Cognition  Cognition Arousal/Alertness: Awake/alert Behavior During Therapy: WFL for tasks assessed/performed Overall Cognitive Status: Within Functional Limits for tasks assessed    Extremity/Trunk Assessment Upper Extremity Assessment Upper Extremity Assessment: Generalized  weakness     Mobility Bed Mobility Bed Mobility: Supine to Sit;Sitting - Scoot to [employer] of Bed;Sit to Supine;Scooting to [facility] Supine to  Sit: 5: Supervision Sitting - Scoot to Edge of Bed: 5: Supervision Sit to Supine: 5: Supervision Scooting to [facility]: 5: Supervision     Exercise     Balance     End of Session    GO    02/24/2013 Darrol Jump OTR/L Pager [phone] Office [phone]  Darrol Jump 02/24/2013, 9:10 AM

## 2013-02-24 NOTE — Progress Notes (Signed)
Subjective: Looks good this morning GI noted daughter spoken with. Last cardiology help guide anticoagulation therapy but suspect rather L. Question her age were actually better off on aspirin once we get a bit further out from her polypectomy. Have discussed the need to be with family members and discussed the risks and benefits of anticoagulation with daughter  Objective: Vital signs in last 24 hours: Temp:  [97.2 F (36.2 C)-98.5 F (36.9 C)] 97.9 F (36.6 C) (11/25 0509) Pulse Rate:  [43-90] 85 (11/25 0509) Resp:  [16-39] 20 (11/25 0509) BP: (128-194)/(54-101) 128/63 mmHg (11/25 0509) SpO2:  [95 %-100 %] 97 % (11/25 0509) Weight:  [62 kg (136 lb 11 oz)] 62 kg (136 lb 11 oz) (11/25 0624) Weight change: 0.764 kg (1 lb 11 oz)  CBG (last 3)  No results found for this basename: GLUCAP,  in the last 72 hours  Intake/Output from previous day: 11/24 0701 - 11/25 0700 In: 700 [P.O.:700] Out: 1350 [Urine:1350]  Physical Exam: Alert awake no distress. Chest clear. Cardiovascular regular rate and rhythm no murmur. Abdomen soft and nontender. Extremities with intact distal pulses. Medial aspect of the right ankle is a bit tender no erythema or effusion.   Lab Results:  Recent Labs  02/23/13 0545 02/24/13 0520  NA 140 135  K 4.1 3.9  CL 109 102  CO2 21 22  GLUCOSE 82 112*  BUN 21 20  CREATININE 1.37* 1.31*  CALCIUM 8.6 8.9   No results found for this basename: AST, ALT, ALKPHOS, BILITOT, PROT, ALBUMIN,  in the last 72 hours  Recent Labs  02/21/13 1521  02/23/13 0545 02/24/13 0520  WBC 5.5  < > 4.6 7.6  NEUTROABS 4.0  --   --   --   HGB 6.6*  < > 9.1* 10.1*  HCT 20.7*  < > 27.2* 30.4*  MCV 83.1  < > 83.4 84.2  PLT 199  < > 168 186  < > = values in this interval not displayed. Lab Results  Component Value Date   INR 1.16 10/13/2012   INR 1.06 01/18/2009    Recent Labs  02/21/13 2026 02/22/13 0338 02/22/13 0902  TROPONINI <0.30 <0.30 <0.30    Recent Labs  02/21/13 2026  TSH 0.348*    Recent Labs  02/21/13 2337  VITAMINB12 314  FOLATE 19.0  FERRITIN 8*  TIBC 448  IRON 122  RETICCTPCT 3.0    Studies/Results: No results found.   Assessment/Plan: #1 anemia secondary to GI blood loss colonoscopy noted empiric proton pump inhibitor hemoglobin stable #2 atrial fibrillation rate controlled holding off on any further anticoagulation #3 coronary artery disease status post PCI #4 CK D63  We'll check some x-rays of the right ankle mobilize her assess for safety hopefully discharge tomorrow. Will S. Cardiologist away in an anticoagulation   LOS: 3 days   Tonee Silverstein A 02/24/2013, 7:46 AM

## 2013-02-24 NOTE — Progress Notes (Signed)
Family is aware of morning rounds and is requesting phone call from MD. Please call [name] ASAP [phone]. Daughter will like to be update on the X-ray results and any other pertinent information.

## 2013-02-24 NOTE — Progress Notes (Signed)
Report given to receiving RN. Patient is stable with no verbal complaints and no signs or symptoms of distress or discomfort.

## 2013-02-25 LAB — BASIC METABOLIC PANEL
BUN: 17 mg/dL (ref 6–23)
CO2: 24 mEq/L (ref 19–32)
Calcium: 8.7 mg/dL (ref 8.4–10.5)
Creatinine, Ser: 1.24 mg/dL — ABNORMAL HIGH (ref 0.50–1.10)
Glucose, Bld: 95 mg/dL (ref 70–99)

## 2013-02-25 LAB — CBC
MCH: 27.9 pg (ref 26.0–34.0)
MCHC: 33.1 g/dL (ref 30.0–36.0)
MCV: 84.3 fL (ref 78.0–100.0)
Platelets: 167 10*3/uL (ref 150–400)
RDW: 15.2 % (ref 11.5–15.5)
WBC: 6.2 10*3/uL (ref 4.0–10.5)

## 2013-02-25 MED ORDER — PANTOPRAZOLE SODIUM 40 MG PO TBEC: 40.0000 mg | DELAYED_RELEASE_TABLET | Freq: Two times a day (BID) | ORAL | Status: DC

## 2013-02-25 NOTE — Progress Notes (Signed)
Physical Therapy Treatment Patient Details Name: Julie Lara MRN: [id number] DOB: [birth year] Today's Date: 02/25/2013 Time: 1011-1027 PT Time Calculation (min): 16 min  PT Assessment / Plan / Recommendation  History of Present Illness pt presents with Anemia and GIB.  New R malleolus avulsion fx.  RN received NWB orders from Dr. Reynaldo Minium.  Patient in CAM walker.    PT Comments   Patient now with NWB status for RLE, making gait more difficult.  Was able to ambulate 20' with assist.  Difficulty with CAM boot and NWB status together.  Will have assist at home.  Follow Up Recommendations  Home health PT;Supervision - Intermittent     Does the patient have the potential to tolerate intense rehabilitation     Barriers to Discharge        Equipment Recommendations  Rolling walker with 5" wheels    Recommendations for Other Services    Frequency Min 3X/week   Progress towards PT Goals Progress towards PT goals: Not progressing toward goals - comment (Due to NWB status initiated today.)  Plan Current plan remains appropriate    Precautions / Restrictions Precautions Precautions: Fall Required Braces or Orthoses: Other Brace/Splint Other Brace/Splint: CAM walking shoe Restrictions Weight Bearing Restrictions: Yes RLE Weight Bearing: Non weight bearing   Pertinent Vitals/Pain     Mobility  Bed Mobility Bed Mobility: Not assessed Transfers Transfers: Sit to Stand;Stand to Sit Sit to Stand: 4: Min guard;With upper extremity assist;With armrests;From chair/3-in-1 Stand to Sit: 4: Min guard;With upper extremity assist;With armrests;To chair/3-in-1 Details for Transfer Assistance: Verbal cues for hand placement.  Cues to maintain NWB status on RLE.  Balance slightly decreased initially. Ambulation/Gait Ambulation/Gait Assistance: 4: Min guard Ambulation Distance (Feet): 20 Feet Assistive device: Rolling walker Ambulation/Gait Assistance Details: Verbal cues for NWB status RLE.   Cues for safe use of RW and gait sequence.  Needed 2 standing rest breaks due to UE fatigue.  Difficulty with NWB status - needed repeated cuing. Gait Pattern: Step-to pattern Gait velocity: Slow gait speed      PT Goals (current goals can now be found in the care plan section) Acute Rehab PT Goals Patient Stated Goal: Home  Visit Information  Last PT Received On: 02/25/13 Assistance Needed: +1 History of Present Illness: pt presents with Anemia and GIB.  New R malleolus avulsion fx.  RN received NWB orders from Dr. Reynaldo Minium.  Patient in CAM walker.     Subjective Data  Subjective: "I can manage at home." Patient Stated Goal: Home   Cognition  Cognition Arousal/Alertness: Awake/alert Behavior During Therapy: WFL for tasks assessed/performed Overall Cognitive Status: Within Functional Limits for tasks assessed    Balance  Balance Balance Assessed: Yes Static Standing Balance Static Standing - Balance Support: Right upper extremity supported;During functional activity Static Standing - Level of Assistance: 4: Min assist  End of Session PT - End of Session Equipment Utilized During Treatment: Gait belt (CAM walking boot) Activity Tolerance: Patient limited by fatigue Patient left: in chair;with call bell/phone within reach;with family/visitor present Nurse Communication: Mobility status;Weight bearing status   GP     Despina Pole 02/25/2013, 10:57 AM Carita Pian. Sanjuana Kava, [place] Pager [phone]

## 2013-02-25 NOTE — Discharge Summary (Signed)
DISCHARGE SUMMARY  Julie Lara  MR#: [id number]  DOB:08/20/1920  Date of Admission: 02/21/2013 Date of Discharge: 02/25/2013  Attending Physician:Julie Lara  Patient's HW:[id number] A, MD  Consults:  GI, cardilogy  Discharge Diagnoses: Principal Problem:   Anemia Active Problems:   Hypothyroidism   Atrial fibrillation with RVR   Near syncope   Acute on chronic renal failure   Chronic systolic congestive heart failure   GI bleed   Chronic anticoagulation   Coronary artery disease with history of myocardial infarction without history of CABG   Benign neoplasm of colon   Nonspecific abnormal finding in stool contents   Discharge Medications:   Medication List    STOP taking these medications       aspirin 81 MG tablet      TAKE these medications       apixaban 2.5 MG Tabs tablet  Commonly known as:  ELIQUIS  Take 1 tablet (2.5 mg total) by mouth 2 (two) times daily.     atorvastatin 40 MG tablet  Commonly known as:  LIPITOR  Take 1 tablet (40 mg total) by mouth daily at 6 PM.     FERREX 150 150 MG capsule  Generic drug:  iron polysaccharides  Take 150 mg by mouth daily.     levothyroxine 25 MCG tablet  Commonly known as:  SYNTHROID, LEVOTHROID  Take 25 mcg by mouth daily before breakfast.     metoprolol tartrate 25 MG tablet  Commonly known as:  LOPRESSOR  Take 1/2 tablet twice Lara day     nitroGLYCERIN 0.4 MG SL tablet  Commonly known as:  NITROSTAT  Place 1 tablet (0.4 mg total) under the tongue every 5 (five) minutes as needed for chest pain.     pantoprazole 40 MG tablet  Commonly known as:  PROTONIX  Take 1 tablet (40 mg total) by mouth 2 (two) times daily before Lara meal.     VITAMIN D PO  Take 1,000 Units by mouth daily.        Hospital Procedures: Dg Ankle 2 Views Right  02/24/2013   CLINICAL DATA:  Reported history of trauma  EXAM: RIGHT ANKLE - 2 VIEW  COMPARISON:  None.  FINDINGS: The bones are osteopenic. Lara  crescent-shaped lucency projects along the distal periphery of the lateral malleolus. There is suggestion of mild cortical irregularity along the distal tip of the lateral malleolus and these findings are suspicious for Lara small avulsion fracture. Chondrocalcinosis is identified within the medial aspect of the ankle mortise. Coarse atherosclerotic calcifications identified within posterior tibial artery. Chondrocalcinosis also appreciated medially within the ankle mortise. There is Lara soft tissue swelling within the lateral soft tissues of the ankle.  IMPRESSION: 1. Findings concerning for an avulsion fracture involving the distal lateral malleolus 2. Osteoarthritic changes within the ankle 3. Atherosclerotic disease.   Electronically Signed   By: Julie Lara M.D.   On: 02/24/2013 08:54    History of Present Illness: Julie Lara is Lara remarkably vibrant 79 year old white female with Lara history of coronary artery disease with non-ST elevation MI s/p PCI (7/14), idiopathic cardiomyopathy (ejection fraction 40%), and atrial fibrillation on anticoagulation with Eliquis who presented to the emergency department with the complaint of profound weakness. The patient states she has been weak and fatigued since her MI in 7/14. She has been participating in cardiac rehabilitation. She's done well with this stating that generally improves as the day goes along. She saw Dr. Reynaldo Lara about 3 weeks ago  and was found to be anemic with Lara Hg of 8.8. She was started on iron at that time. Over the past several days she feels like she is become increasingly weak. Today, she had Lara bowel movement. She then tried to leave the house to go to lunch with her family. After walking out of the house she became very weak and slumped to the ground. She was minimally responsive for about 5 minutes but no definite loss of consciousness. She was unable to stand do to weakness. No injury. Did not hit head. Denies any focal neurological deficit. No  seizure activity. No loss of bowel or bladder function. EMS was called she was brought to the emergency department where her hemoglobin is now 6.6. ER MD reports heme positive dark stool. EKG shows atrial fibrillation but no acute changes. Cardiac enzymes are negative. She denies any chest pain, palpitations, or significant shortness of breath. She has some constipation and noted dark stools recently but she attributed that to iron. She did take 2 extra aspirin within the past week but denies any NSAID use. No abdominal pain, diarrhea. She has never had colonoscopy or EGD.   Hospital Course: Patient was admitted with near-syncope, profound anemia presumed secondary to GI blood loss.  She was transfused.  She stabilized her hemoglobin in the 10 range.  Both LES and aspirin were stopped transiently but Lara cardiology and GI recommendation L was was restarted today.  She remained stable from Lara cardiac standpoint with Julie Lara fibrillation paroxysmal rate controlled and stable hemodynamically.  She perked up considerably after 2 units of packed red blood cells.  Upper GI endoscopy and colonoscopy revealed only an unimpressive polyp no source of bleeding.  She is tentatively scheduled for outpatient small capsule endoscopy and we'll reevaluate the need for this in the outpatient setting.  Further complicating this, she yesterday trapped her foot in the bed sheets and when pulling it strained her ankle.  Subsequent right ankle films revealed Lara questionable small evulsion on the lateral condyle.  As Lara result of this she has been placed in Lara walking boot and physical therapy consulted for utilization of Lara walker.  Scleral E nonsurgical.  I had an extensive discussion on 2 different occasions with Julie Lara the daughter regarding the difficulty in managing this occult GI bleed and balancing the anticoagulation issues and risks.  We did get GI and put on this as well as cardiology input.  She is discharged in stable and much  improved condition with hemoglobin in the 10 range.  Cardiac status is well compensated.  She is handling the right ankle well.  We discharged her home to the care of her daughter round-the-clock care.  Day of Discharge Exam BP 116/67  Pulse 75  Temp(Src) 97.8 F (36.6 C) (Oral)  Resp 20  Ht 5\' 4"  (1.626 m)  Wt 62.234 kg (137 lb 3.2 oz)  BMI 23.54 kg/m2  SpO2 96%  Physical Exam: General appearance: alert, cooperative and no distress Eyes: no scleral icterus Throat: oropharynx moist without erythema Resp: clear to auscultation bilaterally Cardio: regular rate and rhythm Extremities: no clubbing, cyanosis or edema  Discharge Labs:  Recent Labs  02/24/13 0520 02/25/13 0540  NA 135 136  K 3.9 4.1  CL 102 102  CO2 22 24  GLUCOSE 112* 95  BUN 20 17  CREATININE 1.31* 1.24*  CALCIUM 8.9 8.7   No results found for this basename: AST, ALT, ALKPHOS, BILITOT, PROT, ALBUMIN,  in the last 72  hours  Recent Labs  02/24/13 0520 02/25/13 0540  WBC 7.6 6.2  HGB 10.1* 9.8*  HCT 30.4* 29.6*  MCV 84.2 84.3  PLT 186 167   No results found for this basename: CKTOTAL, CKMB, CKMBINDEX, TROPONINI,  in the last 72 hours No results found for this basename: TSH, T4TOTAL, FREET3, T3FREE, THYROIDAB,  in the last 72 hours No results found for this basename: VITAMINB12, FOLATE, FERRITIN, TIBC, IRON, RETICCTPCT,  in the last 72 hours  Discharge instructions:     Discharge Orders   Future Appointments Provider Department Dept Phone   03/02/2013 2:00 PM [place] [facility] [phone]   03/09/2013 8:00 AM [facility] [phone]   03/12/2013 2:30 PM Peter M Martinique, MD [place] [facility] [phone]   Future Orders Complete By Expires   Diet - low sodium heart healthy  As directed    Increase activity slowly  As directed       Disposition: home with daughter  Follow-up  Appts: Follow-up with Dr. Reynaldo Lara at [facility] in 1 week.  Call for appointment.  Condition on Discharge: improved and stable condition  Tests Needing Follow-up: Repeat CBC and alt only orthopedic followup of the ankle.  I did caution the daughter that she is high risk for fall given the ankle issue as well as her debilitated state so caution  Signed: Tyrion Glaude Lara 02/25/2013, 12:52 PM

## 2013-02-25 NOTE — Evaluation (Signed)
Occupational Therapy Evaluation Patient Details Name: Julie Lara MRN: [id number] DOB: 29-Sep-1920 Today's Date: 02/25/2013 Time: [id number] OT Time Calculation (min): 44 min  OT Assessment / Plan / Recommendation History of present illness pt presents with Anemia and GIB.  New R malleolus avulsion fx.  Awaiting WB status, pt not in CAM boot.    Clinical Impression   Progressing toward goals.  Limited treatment to seated bathing with momentary standing with support of walker while in CAM boot awaiting WB status on R LE. Changed d/c plan to [facility] to assess for bathroom equipment and instruct in IADL following precautions and to instruct in safety. Needs a 3 in 1.    OT Assessment       Follow Up Recommendations  Home health OT;Supervision/Assistance - 24 hour    Barriers to Discharge      Equipment Recommendations  3 in 1 bedside comode (RW)    Recommendations for Other Services    Frequency  Min 2X/week    Precautions / Restrictions Precautions Precautions: Fall Required Braces or Orthoses:  (CAM on R LE ) Restrictions Weight Bearing Restrictions:  (pending, no order on chart)   Pertinent Vitals/Pain VSS, no pain reported    ADL  Grooming: Wash/dry hands;Set up Where Assessed - Grooming: Unsupported sitting Upper Body Bathing: Set up Where Assessed - Upper Body Bathing: Unsupported sitting Lower Body Bathing: Minimal assistance Where Assessed - Lower Body Bathing: Unsupported sitting;Supported sit to stand Upper Body Dressing: Set up Where Assessed - Upper Body Dressing: Unsupported sitting Lower Body Dressing: Minimal assistance Where Assessed - Lower Body Dressing: Unsupported sitting Toilet Transfer: Min guard Toilet Transfer Method: Sit to Loss adjuster, chartered: Bedside commode Toileting - Water quality scientist and Hygiene: Min guard Where Assessed - Best boy and Hygiene: Sit to stand from 3-in-1 or toilet Equipment Used: Gait  belt;Rolling walker (CAM boot) Transfers/Ambulation Related to ADLs: Pt up in chair, used walker for transfer to 3 in1 with CAM boot. Verbal cues for hand placement, uses momentum for sit >stand ADL Comments: Instructed in multiple uses of 3 in1 and NOT to get down in tub to bathe.      OT Diagnosis:    OT Problem List:   OT Treatment Interventions:     OT Goals(Current goals can be found in the care plan section) Acute Rehab OT Goals Patient Stated Goal: Home  Visit Information  Last OT Received On: 02/25/13 Assistance Needed: +1 History of Present Illness: pt presents with Anemia and GIB.  New R malleolus avulsion fx.  Awaiting WB status, pt not in CAM boot.        Prior Functioning               Vision/Perception     Cognition  Cognition Arousal/Alertness: Awake/alert Behavior During Therapy: WFL for tasks assessed/performed Overall Cognitive Status: Within Functional Limits for tasks assessed    Extremity/Trunk Assessment       Mobility Bed Mobility Bed Mobility: Not assessed Transfers Transfers: Sit to Stand;Stand to Sit Sit to Stand: 4: Min guard;With upper extremity assist Stand to Sit: 4: Min guard;With upper extremity assist Details for Transfer Assistance: unsteady in standing for managing panties and washing periarea.     Exercise     Balance Balance Balance Assessed: Yes Static Standing Balance Static Standing - Balance Support: Right upper extremity supported;During functional activity Static Standing - Level of Assistance: 4: Min assist   End of Session OT - End of Session Activity  Tolerance: Patient tolerated treatment well Patient left: in chair;with call bell/phone within reach;with family/visitor present  GO     Malka So 02/25/2013, 10:05 AM [phone]

## 2013-02-25 NOTE — Progress Notes (Signed)
DC IV, DC Tele, DC Home. Discharge instructions and home medications discussed with patient and patient's family members. Patient and family denied any questions or concerns at this time. Front wheel walker delivered and given to patient. Patient leaving unit via wheelchair and appears in no acute distress.

## 2013-02-25 NOTE — Care Management Note (Addendum)
  Page 2 of 2   02/25/2013     2:32:09 PM   CARE MANAGEMENT NOTE 02/25/2013  Patient:  Julie Lara, Julie Lara   Account Number:  [id number]  Date Initiated:  02/25/2013  Documentation initiated by:  Fuller Mandril  Subjective/Objective Assessment:   79 yo female admitted 11/22 with  anemia, normal MCV.  FOB + stool. //from home alone.     Action/Plan:   Planning on EGD  and if no clear explanation then colonoscopy tomorrow.//home with Home Health   Anticipated DC Date:  02/25/2013   Anticipated DC Plan:  [place]         [facility] Choice  HOME HEALTH   Choice offered to / List presented to:  C-2 HC POA / Guardian   DME arranged  [place]      DME agency  [place] arranged  [place].   Status of service:  Completed, signed off Medicare Important Message given?   (If response is "NO", the following Medicare IM given date fields will be blank) Date Medicare IM given:   Date Additional Medicare IM given:    Discharge Disposition:    Per UR Regulation:    If discussed at Long Length of Stay Meetings, dates discussed:    Comments:  02/25/13 [place], RN, BSN, [employer] [phone] CM spoke with patient concerning discharge planning. Pt offered choice for [facility] services upon discharge. Per pt choice [facility] to provide [facility] services.  [facility] rep  contacted concerning new referral. Pt to discharge home alone. Pt request HHPT  upon discharge. Pt identified rolling walker as need in home.  02/25/13 [place], RN, BSN, [employer] [phone] I have recommended that this patient have a HHPT but she declines at this time. I have discussed the risks and benefits of this Home Health services with her. The patient verbalizes understanding.

## 2013-02-25 NOTE — Progress Notes (Signed)
Orthopedic Tech Progress Note Patient Details:  Julie Lara 11/22/1920 [id number]  Ortho Devices Type of Ortho Device: CAM walker Ortho Device/Splint Interventions: Application   [name] 02/25/2013, 9:34 AM

## 2013-03-02 DIAGNOSIS — I4891 Unspecified atrial fibrillation: Secondary | ICD-10-CM | POA: Diagnosis not present

## 2013-03-02 DIAGNOSIS — D649 Anemia, unspecified: Secondary | ICD-10-CM | POA: Diagnosis not present

## 2013-03-02 DIAGNOSIS — I509 Heart failure, unspecified: Secondary | ICD-10-CM | POA: Diagnosis not present

## 2013-03-02 DIAGNOSIS — I428 Other cardiomyopathies: Secondary | ICD-10-CM | POA: Diagnosis not present

## 2013-03-02 DIAGNOSIS — K922 Gastrointestinal hemorrhage, unspecified: Secondary | ICD-10-CM | POA: Diagnosis not present

## 2013-03-02 DIAGNOSIS — I5022 Chronic systolic (congestive) heart failure: Secondary | ICD-10-CM | POA: Diagnosis not present

## 2013-03-02 DIAGNOSIS — I2510 Atherosclerotic heart disease of native coronary artery without angina pectoris: Secondary | ICD-10-CM | POA: Diagnosis not present

## 2013-03-02 DIAGNOSIS — IMO0001 Reserved for inherently not codable concepts without codable children: Secondary | ICD-10-CM | POA: Diagnosis not present

## 2013-03-04 DIAGNOSIS — D649 Anemia, unspecified: Secondary | ICD-10-CM | POA: Diagnosis not present

## 2013-03-10 ENCOUNTER — Telehealth: Payer: Self-pay | Admitting: Gastroenterology

## 2013-03-10 NOTE — Telephone Encounter (Signed)
Pt has been rescheduled. 

## 2013-03-12 ENCOUNTER — Ambulatory Visit (INDEPENDENT_AMBULATORY_CARE_PROVIDER_SITE_OTHER): Payer: Medicare Other | Admitting: Cardiology

## 2013-03-12 ENCOUNTER — Encounter: Payer: Self-pay | Admitting: Cardiology

## 2013-03-12 VITALS — BP 136/68 | HR 80 | Ht 64.0 in | Wt 138.0 lb

## 2013-03-12 DIAGNOSIS — Z7901 Long term (current) use of anticoagulants: Secondary | ICD-10-CM

## 2013-03-12 DIAGNOSIS — I5022 Chronic systolic (congestive) heart failure: Secondary | ICD-10-CM | POA: Diagnosis not present

## 2013-03-12 DIAGNOSIS — I2510 Atherosclerotic heart disease of native coronary artery without angina pectoris: Secondary | ICD-10-CM

## 2013-03-12 DIAGNOSIS — I509 Heart failure, unspecified: Secondary | ICD-10-CM

## 2013-03-12 DIAGNOSIS — D62 Acute posthemorrhagic anemia: Secondary | ICD-10-CM | POA: Diagnosis not present

## 2013-03-12 NOTE — Patient Instructions (Signed)
Continue your current therapy   I will see you in 3 months. 

## 2013-03-12 NOTE — Progress Notes (Signed)
Julie Lara Date of Birth: Jan 24, 1921 Medical Record [id number]  History of Present Illness: Julie Lara is seen back for a follow up visit. She is a 79 year old female with a history of CAD, PAF, dilated CM, AI, PACs, hypothyroidism, CKD stage III and prior UTIs.  Seen in early August after being in the hospital with atrial fib with RVR. Treated with IV diltiazem and placed on Eliquis. TSH was depressed and her Synthroid was cut back. Troponins slightly up and so she was cathed and had BMS to the proximal RCA. EF per echo was 40%, moderate LV dilatation, mild LVH, mild MR, mild biatrial enlargement and changes questionable for IVC thrombus. She was continued on Plavix for one month, then this was discontinued.  3 weeks ago she was admitted with profound anemia Hgb 6.6 and heme positive stool. Upper and lower endoscopy were negative. Plan for capsule videoendoscopy. ASA discontinued. Eliquis continued. Transfused 3 units of PRBCs. Feeling much better now. Denies chest pain or SOB. No obvious bleeding. Energy level is good. Planning to resume Cardiac Rehab in Jan.  Current Outpatient Prescriptions  Medication Sig Dispense Refill  . apixaban (ELIQUIS) 2.5 MG TABS tablet Take 1 tablet (2.5 mg total) by mouth 2 (two) times daily.  60 tablet  1  . atorvastatin (LIPITOR) 40 MG tablet Take 1 tablet (40 mg total) by mouth daily at 6 PM.  30 tablet  1  . Cholecalciferol (VITAMIN D PO) Take 1,000 Units by mouth daily.       . iron polysaccharides (FERREX 150) 150 MG capsule Take 150 mg by mouth daily.       Marland Kitchen levothyroxine (SYNTHROID, LEVOTHROID) 25 MCG tablet Take 25 mcg by mouth daily before breakfast.      . metoprolol tartrate (LOPRESSOR) 25 MG tablet Take 1/2 tablet twice a day  30 tablet  6  . nitroGLYCERIN (NITROSTAT) 0.4 MG SL tablet Place 1 tablet (0.4 mg total) under the tongue every 5 (five) minutes as needed for chest pain.  25 tablet  3  . pantoprazole (PROTONIX) 40 MG tablet Take 1 tablet  (40 mg total) by mouth 2 (two) times daily before a meal.  60 tablet  5   No current facility-administered medications for this visit.    No Known Allergies  Past Medical History  Diagnosis Date  . 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 History of thyroidectomy   . Dilated cardiomyopathy   . Aortic insufficiency     mild to moderate  . PAC (premature atrial contraction)     Chronic  . Hypothyroidism   . Idiopathic cardiomyopathy 1999    EF 40% by Echo on 10/13/12  . Goiter   . NSTEMI (non-ST elevated myocardial infarction) July 2014    95% prox RCA s/p BMS  . Atrial fibrillation   . CAD (coronary artery disease)   . Acute blood loss anemia     November 2014    Past Surgical History  Procedure Laterality Date  . Cardiac catheterization    . Thyroidectomy  2008  . Total vaginal hysterectomy      Complete  . Coronary angioplasty with stent placement      BMS-95% prox RCA  . Biopsy of sacral lesion  2010  . Esophagogastroduodenoscopy N/A 02/22/2013    Procedure: ESOPHAGOGASTRODUODENOSCOPY (EGD);  Surgeon: Milus Banister, MD;  Location: [facility];  Service: Endoscopy;  Laterality: N/A;  . Colonoscopy N/A 02/23/2013  Procedure: COLONOSCOPY;  Surgeon: Ladene Artist, MD;  Location: [facility] ENDOSCOPY;  Service: Endoscopy;  Laterality: N/A;    History  Smoking status  . Never Smoker   Smokeless tobacco  . Never Used    History  Alcohol Use No    History reviewed. No pertinent family history.  Review of Systems: The review of systems is per the HPI.  All other systems were reviewed and are negative.  Physical Exam: BP 136/68  Pulse 80  Ht 5\' 4"  (1.626 m)  Wt 138 lb (62.596 kg)  BMI 23.68 kg/m2 Patient is very pleasant and in no acute distress. Looks younger than her stated age. Skin is warm and dry. Color is normal.  HEENT is unremarkable. Normocephalic/atraumatic. PERRL. Sclera are nonicteric. Neck is supple. No masses. No JVD. Lungs  are clear. Cardiac exam shows an irregular rhythm. Rate is controlled. Abdomen is soft. Extremities full but are without edema. Gait and ROM are intact. No gross neurologic deficits noted.  LABORATORY DATA: PENDING  Lab Results  Component Value Date   WBC 6.2 02/25/2013   HGB 9.8* 02/25/2013   HCT 29.6* 02/25/2013   PLT 167 02/25/2013   GLUCOSE 95 02/25/2013   CHOL 146 12/15/2012   TRIG 54.0 12/15/2012   HDL 64.10 12/15/2012   LDLCALC 71 12/15/2012   ALT 17 12/15/2012   AST 19 12/15/2012   NA 136 02/25/2013   K 4.1 02/25/2013   CL 102 02/25/2013   CREATININE 1.24* 02/25/2013   BUN 17 02/25/2013   CO2 24 02/25/2013   TSH 0.348* 02/21/2013   INR 1.16 10/13/2012       Assessment / Plan:  1. CAD - s/p NSTEMI in July - s/p cath with PCI with BMS to the RCA - EF of 40% per her echo - she continues to do well. Now on  Eliquis only. Given prior stent the lack of antiplatelet therapy is not ideal but with recent bleeding this seems to be the right thing to do.   2. Atrial fib - rate is well controlled. She is on Eliquis. Ecgs in November showed NSR with PACs.   3. Hypothyroidism   4. HLD - now on statin therapy.   5. LV dysfunction - EF was 40% by echo - currently looks well compensated. Would favor continued medical management.   6. GI bleed. Source unclear. Proceed with capsule endoscopy.

## 2013-03-13 ENCOUNTER — Ambulatory Visit: Payer: Medicare Other | Admitting: Gastroenterology

## 2013-03-13 DIAGNOSIS — K922 Gastrointestinal hemorrhage, unspecified: Secondary | ICD-10-CM

## 2013-03-13 NOTE — Progress Notes (Signed)
Pt here for capsule teaching. She will have her procedure on 03/20/13. She was given prep instructions and appt info and she and her daughter stated understanding. Pt is a little reluctant about swallowing the pill, but she was instructed she may bring yogurt.

## 2013-03-16 DIAGNOSIS — L723 Sebaceous cyst: Secondary | ICD-10-CM | POA: Diagnosis not present

## 2013-03-16 DIAGNOSIS — L570 Actinic keratosis: Secondary | ICD-10-CM | POA: Diagnosis not present

## 2013-03-18 DIAGNOSIS — M25539 Pain in unspecified wrist: Secondary | ICD-10-CM | POA: Diagnosis not present

## 2013-03-18 DIAGNOSIS — D649 Anemia, unspecified: Secondary | ICD-10-CM | POA: Diagnosis not present

## 2013-03-18 DIAGNOSIS — I4891 Unspecified atrial fibrillation: Secondary | ICD-10-CM | POA: Diagnosis not present

## 2013-03-18 DIAGNOSIS — Z6825 Body mass index (BMI) 25.0-25.9, adult: Secondary | ICD-10-CM | POA: Diagnosis not present

## 2013-03-18 DIAGNOSIS — K922 Gastrointestinal hemorrhage, unspecified: Secondary | ICD-10-CM | POA: Diagnosis not present

## 2013-03-20 ENCOUNTER — Ambulatory Visit (INDEPENDENT_AMBULATORY_CARE_PROVIDER_SITE_OTHER): Payer: Medicare Other | Admitting: Gastroenterology

## 2013-03-20 DIAGNOSIS — E0590 Thyrotoxicosis, unspecified without thyrotoxic crisis or storm: Secondary | ICD-10-CM | POA: Diagnosis not present

## 2013-03-20 DIAGNOSIS — D649 Anemia, unspecified: Secondary | ICD-10-CM | POA: Diagnosis not present

## 2013-03-20 DIAGNOSIS — I509 Heart failure, unspecified: Secondary | ICD-10-CM

## 2013-03-20 DIAGNOSIS — Z7901 Long term (current) use of anticoagulants: Secondary | ICD-10-CM | POA: Diagnosis not present

## 2013-03-20 DIAGNOSIS — I2510 Atherosclerotic heart disease of native coronary artery without angina pectoris: Secondary | ICD-10-CM

## 2013-03-20 DIAGNOSIS — D62 Acute posthemorrhagic anemia: Secondary | ICD-10-CM

## 2013-03-20 DIAGNOSIS — R195 Other fecal abnormalities: Secondary | ICD-10-CM

## 2013-03-20 DIAGNOSIS — I5022 Chronic systolic (congestive) heart failure: Secondary | ICD-10-CM

## 2013-03-20 NOTE — Progress Notes (Signed)
Patient here today for capsule endo.  Patient tolerated the procedure well.  She verbalized understanding of all written and verbal instructions.

## 2013-03-26 DIAGNOSIS — Z6825 Body mass index (BMI) 25.0-25.9, adult: Secondary | ICD-10-CM | POA: Diagnosis not present

## 2013-03-26 DIAGNOSIS — R5381 Other malaise: Secondary | ICD-10-CM | POA: Diagnosis not present

## 2013-03-26 DIAGNOSIS — E038 Other specified hypothyroidism: Secondary | ICD-10-CM | POA: Diagnosis not present

## 2013-03-26 DIAGNOSIS — I4891 Unspecified atrial fibrillation: Secondary | ICD-10-CM | POA: Diagnosis not present

## 2013-03-26 DIAGNOSIS — M25539 Pain in unspecified wrist: Secondary | ICD-10-CM | POA: Diagnosis not present

## 2013-03-26 DIAGNOSIS — K922 Gastrointestinal hemorrhage, unspecified: Secondary | ICD-10-CM | POA: Diagnosis not present

## 2013-03-26 DIAGNOSIS — D649 Anemia, unspecified: Secondary | ICD-10-CM | POA: Diagnosis not present

## 2013-03-26 DIAGNOSIS — I428 Other cardiomyopathies: Secondary | ICD-10-CM | POA: Diagnosis not present

## 2013-04-09 ENCOUNTER — Encounter: Payer: Self-pay | Admitting: Gastroenterology

## 2013-04-15 ENCOUNTER — Telehealth: Payer: Self-pay | Admitting: Gastroenterology

## 2013-04-15 DIAGNOSIS — D649 Anemia, unspecified: Secondary | ICD-10-CM | POA: Diagnosis not present

## 2013-04-15 DIAGNOSIS — I10 Essential (primary) hypertension: Secondary | ICD-10-CM | POA: Diagnosis not present

## 2013-04-15 DIAGNOSIS — Z6825 Body mass index (BMI) 25.0-25.9, adult: Secondary | ICD-10-CM | POA: Diagnosis not present

## 2013-04-15 DIAGNOSIS — E038 Other specified hypothyroidism: Secondary | ICD-10-CM | POA: Diagnosis not present

## 2013-04-15 DIAGNOSIS — I4891 Unspecified atrial fibrillation: Secondary | ICD-10-CM | POA: Diagnosis not present

## 2013-04-15 NOTE — Telephone Encounter (Signed)
Julie Lara was notified and appt scheduled for 04/24/13 with Dr Ardis Hughs

## 2013-04-15 NOTE — Telephone Encounter (Signed)
Her Hb on last day of admission 2 months ago was also 9.8 so not really a drop from then.   Capsule was normal.  ROV with me later this week or next.  Can be with extender if she would like sooner apt.

## 2013-04-15 NOTE — Telephone Encounter (Signed)
Dr Ardis Hughs did you see the capsule report?

## 2013-04-22 DIAGNOSIS — D649 Anemia, unspecified: Secondary | ICD-10-CM | POA: Diagnosis not present

## 2013-04-22 DIAGNOSIS — K922 Gastrointestinal hemorrhage, unspecified: Secondary | ICD-10-CM | POA: Diagnosis not present

## 2013-04-22 DIAGNOSIS — Z6825 Body mass index (BMI) 25.0-25.9, adult: Secondary | ICD-10-CM | POA: Diagnosis not present

## 2013-04-24 ENCOUNTER — Encounter: Payer: Self-pay | Admitting: Gastroenterology

## 2013-04-24 ENCOUNTER — Ambulatory Visit (INDEPENDENT_AMBULATORY_CARE_PROVIDER_SITE_OTHER): Payer: Medicare Other | Admitting: Gastroenterology

## 2013-04-24 VITALS — BP 100/60 | HR 66 | Ht 64.0 in | Wt 143.4 lb

## 2013-04-24 DIAGNOSIS — D500 Iron deficiency anemia secondary to blood loss (chronic): Secondary | ICD-10-CM | POA: Diagnosis not present

## 2013-04-24 NOTE — Patient Instructions (Addendum)
Please have your next blood tests (to be done at Dr. Jacquiline Doe office) in 3 weeks. Make sure those labs are sent here for review. Our fax number is [phone]. Stay on iron once daily, twice daily (on alternating days). Please return to see Dr. Ardis Hughs in 6 weeks sooner if needed.

## 2013-04-24 NOTE — Progress Notes (Signed)
Review of pertinent gastrointestinal problems: 1. Signficant Hemoccult-positive anemia, hemoglobin 6.6 discovered November 2014.  EGD Dr. Ardis Hughs November 2014 was normal. Colonoscopy Dr. Fuller Plan November 2014 was normal except for mild diverticulosis and 8 mm adenomatous polyp. Outpatient capsule endoscopy was normal. She takes eloquis contributes to whatever blood loss she is having. ASA stopped after that admission.   HPI: This is a very pleasant 80 year old woman who is here with her daughter today.  I last saw at the time of admission for significant Hemoccult-positive anemia. See the workup summarized above.  Her hemoglobin was as high as 10.8 in early December and since then it seems to be slowly dropping. She did cut back from twice daily iron to once daily iron because of some significant GI side effects. A few days ago when she was in her primary care's office he reminded her to try to increase her iron back to twice daily or at least 3 pills out of 2 days.   Takes iron about twice daily.  Since starting her bowels have been dark.    Feels fine,  No CP, no SOB, no abd pains.      Past Medical History  Diagnosis Date  . 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 History of thyroidectomy   . Dilated cardiomyopathy   . Aortic insufficiency     mild to moderate  . PAC (premature atrial contraction)     Chronic  . Hypothyroidism   . Idiopathic cardiomyopathy 1999    EF 40% by Echo on 10/13/12  . Goiter   . NSTEMI (non-ST elevated myocardial infarction) July 2014    95% prox RCA s/p BMS  . Atrial fibrillation   . CAD (coronary artery disease)   . Acute blood loss anemia     November 2014    Past Surgical History  Procedure Laterality Date  . Cardiac catheterization    . Thyroidectomy  2008  . Total vaginal hysterectomy      Complete  . Coronary angioplasty with stent placement      BMS-95% prox RCA  . Biopsy of sacral lesion  2010  .  Esophagogastroduodenoscopy N/A 02/22/2013    Procedure: ESOPHAGOGASTRODUODENOSCOPY (EGD);  Surgeon: Milus Banister, MD;  Location: [place] [facility];  Service: Endoscopy;  Laterality: N/A;  . Colonoscopy N/A 02/23/2013    Procedure: COLONOSCOPY;  Surgeon: Ladene Artist, MD;  Location: [facility] ENDOSCOPY;  Service: Endoscopy;  Laterality: N/A;    Current Outpatient Prescriptions  Medication Sig Dispense Refill  . apixaban (ELIQUIS) 2.5 MG TABS tablet Take 1 tablet (2.5 mg total) by mouth 2 (two) times daily.  60 tablet  1  . atorvastatin (LIPITOR) 40 MG tablet Take 1 tablet (40 mg total) by mouth daily at 6 PM.  30 tablet  1  . Cholecalciferol (VITAMIN D PO) Take 1,000 Units by mouth daily.       . iron polysaccharides (FERREX 150) 150 MG capsule Take 150 mg by mouth daily.       Marland Kitchen levothyroxine (SYNTHROID, LEVOTHROID) 25 MCG tablet Take 25 mcg by mouth daily before breakfast.      . metoprolol tartrate (LOPRESSOR) 25 MG tablet Take 1/2 tablet twice a day  30 tablet  6  . nitroGLYCERIN (NITROSTAT) 0.4 MG SL tablet Place 1 tablet (0.4 mg total) under the tongue every 5 (five) minutes as needed for chest pain.  25 tablet  3  . pantoprazole (PROTONIX) 40 MG tablet Take 1 tablet (  40 mg total) by mouth 2 (two) times daily before a meal.  60 tablet  5   No current facility-administered medications for this visit.    Allergies as of 04/24/2013  . (No Known Allergies)    History reviewed. No pertinent family history.  History   Social History  . Marital Status: Widowed    Spouse Name: N/A    Number of Children: N/A  . Years of Education: N/A   Occupational History  . Retired Astronomer [employer]   Social History Main Topics  . Smoking status: Never Smoker   . Smokeless tobacco: Never Used  . Alcohol Use: No  . Drug Use: No  . Sexual Activity: Not Currently   Other Topics Concern  . Not on file   Social History Narrative   Pt lives alone, but has family close by.      Physical  Exam: BP 100/60  Pulse 66  Ht 5\' 4"  (1.626 m)  Wt 143 lb 6.4 oz (65.046 kg)  BMI 24.60 kg/m2 Constitutional: generally well-appearing Psychiatric: alert and oriented x3 Abdomen: soft, nontender, nondistended, no obvious ascites, no peritoneal signs, normal bowel sounds     Assessment and plan: 80 y.o. female with Hemoccult-positive anemia  She had a thorough workup, see that summarized above. I am comfortable that she does not have any serious neoplastic process or large ulcers. Most likely she has small AVM in the intestine which was not seen by capsule endoscopy. If she could come off of Elavil) probably be the best solution to her anemia however that just is not reasonable solution here. Hopefully supplementing her with iron will hold her counts stable. She is having labs checked in 3 weeks her primary care's office I requested those results be sent here for review. She will follow up with me in 4-5 weeks.

## 2013-04-28 ENCOUNTER — Other Ambulatory Visit: Payer: Self-pay | Admitting: Cardiology

## 2013-04-28 NOTE — Telephone Encounter (Signed)
I see where this was stopped by Julie Lara, but I don't see where she was told to re-start it. Should she be taking this? Please advise. Thanks, MI

## 2013-05-06 ENCOUNTER — Other Ambulatory Visit: Payer: Self-pay | Admitting: Dermatology

## 2013-05-06 DIAGNOSIS — D485 Neoplasm of uncertain behavior of skin: Secondary | ICD-10-CM | POA: Diagnosis not present

## 2013-05-06 DIAGNOSIS — L570 Actinic keratosis: Secondary | ICD-10-CM | POA: Diagnosis not present

## 2013-05-06 DIAGNOSIS — D239 Other benign neoplasm of skin, unspecified: Secondary | ICD-10-CM | POA: Diagnosis not present

## 2013-05-06 DIAGNOSIS — L723 Sebaceous cyst: Secondary | ICD-10-CM | POA: Diagnosis not present

## 2013-05-07 NOTE — Telephone Encounter (Signed)
Spoke to patient's daughter [name] she stated mother does not take Amlodipine.

## 2013-05-13 DIAGNOSIS — Z6826 Body mass index (BMI) 26.0-26.9, adult: Secondary | ICD-10-CM | POA: Diagnosis not present

## 2013-05-13 DIAGNOSIS — K922 Gastrointestinal hemorrhage, unspecified: Secondary | ICD-10-CM | POA: Diagnosis not present

## 2013-05-13 DIAGNOSIS — I4891 Unspecified atrial fibrillation: Secondary | ICD-10-CM | POA: Diagnosis not present

## 2013-05-13 DIAGNOSIS — D649 Anemia, unspecified: Secondary | ICD-10-CM | POA: Diagnosis not present

## 2013-05-20 ENCOUNTER — Other Ambulatory Visit: Payer: Self-pay

## 2013-05-20 MED ORDER — APIXABAN 2.5 MG PO TABS: 2.5000 mg | ORAL_TABLET | Freq: Two times a day (BID) | ORAL | Status: DC

## 2013-05-20 MED ORDER — ATORVASTATIN CALCIUM 40 MG PO TABS: 40.0000 mg | ORAL_TABLET | Freq: Every day | ORAL | Status: DC

## 2013-05-25 ENCOUNTER — Other Ambulatory Visit: Payer: Self-pay

## 2013-05-25 ENCOUNTER — Telehealth: Payer: Self-pay

## 2013-05-25 MED ORDER — ATORVASTATIN CALCIUM 40 MG PO TABS: 40.0000 mg | ORAL_TABLET | Freq: Every day | ORAL | Status: DC

## 2013-05-25 MED ORDER — APIXABAN 2.5 MG PO TABS: 2.5000 mg | ORAL_TABLET | Freq: Two times a day (BID) | ORAL | Status: DC

## 2013-05-25 NOTE — Telephone Encounter (Signed)
Patient's daughter called to get rx for eliquis and I placed samples of eliquis up front

## 2013-05-27 DIAGNOSIS — I4891 Unspecified atrial fibrillation: Secondary | ICD-10-CM | POA: Diagnosis not present

## 2013-05-27 DIAGNOSIS — Z6826 Body mass index (BMI) 26.0-26.9, adult: Secondary | ICD-10-CM | POA: Diagnosis not present

## 2013-05-27 DIAGNOSIS — D649 Anemia, unspecified: Secondary | ICD-10-CM | POA: Diagnosis not present

## 2013-05-28 ENCOUNTER — Other Ambulatory Visit: Payer: Self-pay | Admitting: Cardiology

## 2013-06-04 ENCOUNTER — Ambulatory Visit (INDEPENDENT_AMBULATORY_CARE_PROVIDER_SITE_OTHER): Payer: Medicare Other | Admitting: Cardiology

## 2013-06-04 ENCOUNTER — Encounter: Payer: Self-pay | Admitting: Cardiology

## 2013-06-04 VITALS — BP 127/75 | HR 96 | Ht 64.0 in | Wt 145.0 lb

## 2013-06-04 DIAGNOSIS — I491 Atrial premature depolarization: Secondary | ICD-10-CM | POA: Diagnosis not present

## 2013-06-04 DIAGNOSIS — K922 Gastrointestinal hemorrhage, unspecified: Secondary | ICD-10-CM

## 2013-06-04 DIAGNOSIS — I4891 Unspecified atrial fibrillation: Secondary | ICD-10-CM | POA: Diagnosis not present

## 2013-06-04 DIAGNOSIS — I2510 Atherosclerotic heart disease of native coronary artery without angina pectoris: Secondary | ICD-10-CM | POA: Diagnosis not present

## 2013-06-04 DIAGNOSIS — I428 Other cardiomyopathies: Secondary | ICD-10-CM | POA: Diagnosis not present

## 2013-06-04 DIAGNOSIS — I420 Dilated cardiomyopathy: Secondary | ICD-10-CM

## 2013-06-04 NOTE — Patient Instructions (Signed)
Stop Eliquis   Resume ASA 81 mg daily  Continue your other therapy  I will see you in 6 months.

## 2013-06-04 NOTE — Progress Notes (Signed)
Julie Lara Date of Birth: 02/03/1921 Medical Record [id number]  History of Present Illness: Julie Lara is seen back for a follow up visit. She is a 80 year old female with a history of CAD, PAF, dilated CM, AI, PACs, hypothyroidism, CKD stage III and prior UTIs.  Admitted to the hospital with atrial fib with RVR in July 2014. Was placed on Eliquis. Converted to NSR with PACx. TSH was depressed and her Synthroid was cut back. Troponins slightly up and so she was cathed and had BMS to the proximal RCA. EF per echo was 40%, moderate LV dilatation, mild LVH, mild MR, mild biatrial enlargement and changes questionable for IVC thrombus. She was continued on Plavix for one month, then this was discontinued.  In November she was admitted with profound anemia Hgb 6.6 and heme positive stool. Upper and lower endoscopy were negative. Capsule videoendoscopy was also negative. During that admission there was no AFib but just frequent PACs. ASA discontinued. Eliquis continued. Transfused 3 units of PRBCs. Feeling much better now. Denies chest pain or SOB. No obvious bleeding. Energy level is OK. She is on 2 iron pills a day.   Current Outpatient Prescriptions  Medication Sig Dispense Refill  . apixaban (ELIQUIS) 2.5 MG TABS tablet Take 1 tablet (2.5 mg total) by mouth 2 (two) times daily.  180 tablet  2  . atorvastatin (LIPITOR) 40 MG tablet Take 1 tablet (40 mg total) by mouth daily at 6 PM.  14 tablet  0  . Cholecalciferol (VITAMIN D PO) Take 1,000 Units by mouth daily.       . iron polysaccharides (FERREX 150) 150 MG capsule Take 150 mg by mouth daily.       Marland Kitchen levothyroxine (SYNTHROID, LEVOTHROID) 25 MCG tablet Take 25 mcg by mouth daily before breakfast.      . metoprolol tartrate (LOPRESSOR) 25 MG tablet Take 1/2 tablet twice a day  30 tablet  6  . nitroGLYCERIN (NITROSTAT) 0.4 MG SL tablet Place 1 tablet (0.4 mg total) under the tongue every 5 (five) minutes as needed for chest pain.  25 tablet  3   . pantoprazole (PROTONIX) 40 MG tablet Take 1 tablet (40 mg total) by mouth 2 (two) times daily before a meal.  60 tablet  5   No current facility-administered medications for this visit.    No Known Allergies  Past Medical History  Diagnosis Date  . 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 History of thyroidectomy   . Dilated cardiomyopathy   . Aortic insufficiency     mild to moderate  . PAC (premature atrial contraction)     Chronic  . Hypothyroidism   . Idiopathic cardiomyopathy 1999    EF 40% by Echo on 10/13/12  . Goiter   . NSTEMI (non-ST elevated myocardial infarction) July 2014    95% prox RCA s/p BMS  . Atrial fibrillation   . CAD (coronary artery disease)   . Acute blood loss anemia     November 2014    Past Surgical History  Procedure Laterality Date  . Cardiac catheterization    . Thyroidectomy  2008  . Total vaginal hysterectomy      Complete  . Coronary angioplasty with stent placement      BMS-95% prox RCA  . Biopsy of sacral lesion  2010  . Esophagogastroduodenoscopy N/A 02/22/2013    Procedure: ESOPHAGOGASTRODUODENOSCOPY (EGD);  Surgeon: Milus Banister, MD;  Location: [place];  Service: Endoscopy;  Laterality: N/A;  . Colonoscopy N/A 02/23/2013    Procedure: COLONOSCOPY;  Surgeon: Ladene Artist, MD;  Location: [facility] ENDOSCOPY;  Service: Endoscopy;  Laterality: N/A;    History  Smoking status  . Never Smoker   Smokeless tobacco  . Never Used    History  Alcohol Use No    History reviewed. No pertinent family history.  Review of Systems: The review of systems is per the HPI.  All other systems were reviewed and are negative.  Physical Exam: BP 127/75  Pulse 96  Ht 5\' 4"  (1.626 m)  Wt 145 lb (65.772 kg)  BMI 24.88 kg/m2 Patient is very pleasant and in no acute distress.  Skin is warm and dry. Color is normal.  HEENT is unremarkable. Normocephalic/atraumatic. PERRL. Sclera are nonicteric. Neck is supple. No  masses. No JVD. Lungs are clear. Cardiac exam shows an irregular rhythm. Rate is controlled. Abdomen is soft. Extremities full but are without edema. Gait and ROM are intact. No gross neurologic deficits noted.  LABORATORY DATA:   Lab Results  Component Value Date   WBC 6.2 02/25/2013   HGB 9.8* 02/25/2013   HCT 29.6* 02/25/2013   PLT 167 02/25/2013   GLUCOSE 95 02/25/2013   CHOL 146 12/15/2012   TRIG 54.0 12/15/2012   HDL 64.10 12/15/2012   LDLCALC 71 12/15/2012   ALT 17 12/15/2012   AST 19 12/15/2012   NA 136 02/25/2013   K 4.1 02/25/2013   CL 102 02/25/2013   CREATININE 1.24* 02/25/2013   BUN 17 02/25/2013   CO2 24 02/25/2013   TSH 0.348* 02/21/2013   INR 1.16 10/13/2012    Ecg today: NSR with frequent PACs. Nonspecific ST-T wave abnormality.    Assessment / Plan:  1. CAD - s/p NSTEMI in July - s/p cath with PCI with BMS to the RCA - EF of 40% per her echo - she continues to do well.   2. Atrial fib - From review of her records the only episode was in July 2014. No evidence of recurrence since then. Given history of major GI bleed I think it would be safer to stop Eliquis at this point and put her back on ASA 81 mg daily.  3. Hypothyroidism   4. HLD - now on statin therapy.   5. LV dysfunction - EF was 40% by echo - currently looks well compensated. Would favor continued medical management.   6. GI bleed. Source unclear.  No clear recurrence.

## 2013-06-10 ENCOUNTER — Ambulatory Visit: Payer: Medicare Other | Admitting: Gastroenterology

## 2013-06-17 DIAGNOSIS — D649 Anemia, unspecified: Secondary | ICD-10-CM | POA: Diagnosis not present

## 2013-06-17 DIAGNOSIS — I4891 Unspecified atrial fibrillation: Secondary | ICD-10-CM | POA: Diagnosis not present

## 2013-06-19 ENCOUNTER — Ambulatory Visit: Payer: Medicare Other | Admitting: Cardiology

## 2013-06-23 ENCOUNTER — Other Ambulatory Visit: Payer: Self-pay | Admitting: Cardiology

## 2013-07-09 DIAGNOSIS — E559 Vitamin D deficiency, unspecified: Secondary | ICD-10-CM | POA: Diagnosis not present

## 2013-07-09 DIAGNOSIS — I10 Essential (primary) hypertension: Secondary | ICD-10-CM | POA: Diagnosis not present

## 2013-07-09 DIAGNOSIS — E785 Hyperlipidemia, unspecified: Secondary | ICD-10-CM | POA: Diagnosis not present

## 2013-07-09 DIAGNOSIS — E038 Other specified hypothyroidism: Secondary | ICD-10-CM | POA: Diagnosis not present

## 2013-07-20 DIAGNOSIS — I10 Essential (primary) hypertension: Secondary | ICD-10-CM | POA: Diagnosis not present

## 2013-07-20 DIAGNOSIS — M5137 Other intervertebral disc degeneration, lumbosacral region: Secondary | ICD-10-CM | POA: Diagnosis not present

## 2013-07-20 DIAGNOSIS — D649 Anemia, unspecified: Secondary | ICD-10-CM | POA: Diagnosis not present

## 2013-07-20 DIAGNOSIS — I4891 Unspecified atrial fibrillation: Secondary | ICD-10-CM | POA: Diagnosis not present

## 2013-07-20 DIAGNOSIS — E785 Hyperlipidemia, unspecified: Secondary | ICD-10-CM | POA: Diagnosis not present

## 2013-07-20 DIAGNOSIS — E038 Other specified hypothyroidism: Secondary | ICD-10-CM | POA: Diagnosis not present

## 2013-07-20 DIAGNOSIS — I428 Other cardiomyopathies: Secondary | ICD-10-CM | POA: Diagnosis not present

## 2013-07-20 DIAGNOSIS — Z0000 Encounter for general adult medical examination without abnormal findings: Secondary | ICD-10-CM | POA: Diagnosis not present

## 2013-10-26 ENCOUNTER — Other Ambulatory Visit (HOSPITAL_COMMUNITY): Payer: Self-pay | Admitting: Internal Medicine

## 2013-10-26 DIAGNOSIS — I10 Essential (primary) hypertension: Secondary | ICD-10-CM | POA: Diagnosis not present

## 2013-10-26 DIAGNOSIS — I4891 Unspecified atrial fibrillation: Secondary | ICD-10-CM | POA: Diagnosis not present

## 2013-10-26 DIAGNOSIS — N3941 Urge incontinence: Secondary | ICD-10-CM | POA: Diagnosis not present

## 2013-10-26 DIAGNOSIS — D649 Anemia, unspecified: Secondary | ICD-10-CM | POA: Diagnosis not present

## 2013-10-26 DIAGNOSIS — Z1231 Encounter for screening mammogram for malignant neoplasm of breast: Secondary | ICD-10-CM

## 2013-10-26 DIAGNOSIS — Z6826 Body mass index (BMI) 26.0-26.9, adult: Secondary | ICD-10-CM | POA: Diagnosis not present

## 2013-11-11 DIAGNOSIS — Z961 Presence of intraocular lens: Secondary | ICD-10-CM | POA: Diagnosis not present

## 2013-11-11 DIAGNOSIS — H02059 Trichiasis without entropian unspecified eye, unspecified eyelid: Secondary | ICD-10-CM | POA: Diagnosis not present

## 2013-11-11 DIAGNOSIS — H52209 Unspecified astigmatism, unspecified eye: Secondary | ICD-10-CM | POA: Diagnosis not present

## 2013-11-12 ENCOUNTER — Ambulatory Visit (HOSPITAL_COMMUNITY): Admission: RE | Admit: 2013-11-12 | Payer: Medicare Other | Source: Ambulatory Visit

## 2013-11-23 ENCOUNTER — Ambulatory Visit (HOSPITAL_COMMUNITY)
Admission: RE | Admit: 2013-11-23 | Discharge: 2013-11-23 | Disposition: A | Discharge status: Home / Self Care | Payer: Medicare Other | Source: Ambulatory Visit | Attending: Internal Medicine | Admitting: Internal Medicine

## 2013-11-23 DIAGNOSIS — Z1231 Encounter for screening mammogram for malignant neoplasm of breast: Secondary | ICD-10-CM

## 2013-12-01 ENCOUNTER — Ambulatory Visit (INDEPENDENT_AMBULATORY_CARE_PROVIDER_SITE_OTHER): Payer: Medicare Other | Admitting: Cardiology

## 2013-12-01 ENCOUNTER — Encounter: Payer: Self-pay | Admitting: Cardiology

## 2013-12-01 VITALS — BP 137/70 | HR 67 | Ht 64.0 in | Wt 142.0 lb

## 2013-12-01 DIAGNOSIS — N183 Chronic kidney disease, stage 3 unspecified: Secondary | ICD-10-CM | POA: Diagnosis not present

## 2013-12-01 DIAGNOSIS — I5022 Chronic systolic (congestive) heart failure: Secondary | ICD-10-CM

## 2013-12-01 DIAGNOSIS — I2510 Atherosclerotic heart disease of native coronary artery without angina pectoris: Secondary | ICD-10-CM

## 2013-12-01 DIAGNOSIS — I509 Heart failure, unspecified: Secondary | ICD-10-CM | POA: Diagnosis not present

## 2013-12-01 NOTE — Progress Notes (Signed)
[name] Date of Birth: 12-Dec-1920 Medical Record [id number]  History of Present Illness: Ms. Filosa is seen back for a follow up visit. She is a 80 year old female with a history of CAD, PAF, dilated CM, AI, PACs, hypothyroidism, CKD stage III and prior UTIs.  Admitted to the hospital with atrial fib with RVR in July 2014. Was placed on Eliquis. Converted to NSR with PACx. TSH was depressed and her Synthroid was cut back. Troponins slightly up and so she was cathed and had BMS to the proximal RCA. EF per echo was 40%, moderate LV dilatation, mild LVH, mild MR, mild biatrial enlargement and changes questionable for IVC thrombus. She was continued on Plavix for one month, then this was discontinued.  In November 2014 she was admitted with profound anemia Hgb 6.6 and heme positive stool. Upper and lower endoscopy were negative. Capsule videoendoscopy was also negative. On follow up today she denies chest pain or SOB. No obvious bleeding. States she doesn't work as hard as she used to.  Current Outpatient Prescriptions  Medication Sig Dispense Refill  . atorvastatin (LIPITOR) 40 MG tablet Take 1 tablet (40 mg total) by mouth daily at 6 PM.  14 tablet  0  . Cholecalciferol (VITAMIN D PO) Take 1,000 Units by mouth daily.       . iron polysaccharides (FERREX 150) 150 MG capsule Take 150 mg by mouth daily.       Marland Kitchen levothyroxine (SYNTHROID, LEVOTHROID) 25 MCG tablet Take 25 mcg by mouth daily before breakfast.      . metoprolol tartrate (LOPRESSOR) 25 MG tablet Take 1/2 tablet twice a day  30 tablet  6  . nitroGLYCERIN (NITROSTAT) 0.4 MG SL tablet Place 1 tablet (0.4 mg total) under the tongue every 5 (five) minutes as needed for chest pain.  25 tablet  3  . pantoprazole (PROTONIX) 40 MG tablet Take 1 tablet (40 mg total) by mouth 2 (two) times daily before a meal.  60 tablet  5   No current facility-administered medications for this visit.    No Known Allergies  Past Medical History   Diagnosis Date  . 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 History of thyroidectomy   . Dilated cardiomyopathy   . Aortic insufficiency     mild to moderate  . PAC (premature atrial contraction)     Chronic  . Hypothyroidism   . Idiopathic cardiomyopathy 1999    EF 40% by Echo on 10/13/12  . Goiter   . NSTEMI (non-ST elevated myocardial infarction) July 2014    95% prox RCA s/p BMS  . Atrial fibrillation   . CAD (coronary artery disease)   . Acute blood loss anemia     November 2014    Past Surgical History  Procedure Laterality Date  . Cardiac catheterization    . Thyroidectomy  2008  . Total vaginal hysterectomy      Complete  . Coronary angioplasty with stent placement      BMS-95% prox RCA  . Biopsy of sacral lesion  2010  . Esophagogastroduodenoscopy N/A 02/22/2013    Procedure: ESOPHAGOGASTRODUODENOSCOPY (EGD);  Surgeon: Milus Banister, MD;  Location: [place];  Service: Endoscopy;  Laterality: N/A;  . Colonoscopy N/A 02/23/2013    Procedure: COLONOSCOPY;  Surgeon: Ladene Artist, MD;  Location: [facility] ENDOSCOPY;  Service: Endoscopy;  Laterality: N/A;    History  Smoking status  . Never Smoker   Smokeless tobacco  .  Never Used    History  Alcohol Use No    History reviewed. No pertinent family history.  Review of Systems: The review of systems is per the HPI.  All other systems were reviewed and are negative.  Physical Exam: BP 137/70  Pulse 67  Ht 5\' 4"  (1.626 m)  Wt 142 lb (64.411 kg)  BMI 24.36 kg/m2 Patient is very pleasant and in no acute distress.  Skin is warm and dry. Color is normal.  HEENT is unremarkable. Normocephalic/atraumatic. PERRL. Sclera are nonicteric. Neck is supple. No masses. No JVD. Lungs are clear. Cardiac exam shows an irregular rhythm. Rate is controlled. Abdomen is soft. Extremities full but are without edema. Gait and ROM are intact. No gross neurologic deficits noted.  LABORATORY DATA:     Assessment / Plan:  1. CAD - s/p NSTEMI in July 2014- s/p cath with PCI with BMS to the RCA - EF of 40% per her echo - she continues to do well.   2. Atrial fib -  only episode was in July 2014. No evidence of recurrence since then. Given history of major GI bleed will leave off anticoagulation  3. Hypothyroidism   4. HLD -  on statin therapy.   5. LV dysfunction - EF was 40% by echo - currently looks well compensated. Would favor continued medical management.   6. GI bleed. Source unclear.  No clear recurrence.

## 2013-12-01 NOTE — Patient Instructions (Signed)
Continue your current therapy  I will see you in 6 months.   

## 2013-12-02 ENCOUNTER — Telehealth: Payer: Self-pay | Admitting: Cardiology

## 2013-12-02 NOTE — Telephone Encounter (Signed)
Pt needs her Metoprolol called in,says she have not been able to get through. Please call or send it to [facility].

## 2013-12-03 ENCOUNTER — Other Ambulatory Visit: Payer: Self-pay | Admitting: *Deleted

## 2013-12-03 MED ORDER — METOPROLOL TARTRATE 25 MG PO TABS: ORAL_TABLET | ORAL | Status: DC

## 2013-12-15 ENCOUNTER — Other Ambulatory Visit: Payer: Self-pay

## 2013-12-15 MED ORDER — ATORVASTATIN CALCIUM 40 MG PO TABS: 40.0000 mg | ORAL_TABLET | Freq: Every day | ORAL | Status: DC

## 2013-12-16 ENCOUNTER — Other Ambulatory Visit: Payer: Self-pay

## 2013-12-18 ENCOUNTER — Other Ambulatory Visit: Payer: Self-pay | Admitting: *Deleted

## 2013-12-18 MED ORDER — ATORVASTATIN CALCIUM 40 MG PO TABS: 40.0000 mg | ORAL_TABLET | Freq: Every day | ORAL | Status: DC

## 2014-01-13 DIAGNOSIS — L570 Actinic keratosis: Secondary | ICD-10-CM | POA: Diagnosis not present

## 2014-01-20 DIAGNOSIS — K922 Gastrointestinal hemorrhage, unspecified: Secondary | ICD-10-CM | POA: Diagnosis not present

## 2014-01-20 DIAGNOSIS — I10 Essential (primary) hypertension: Secondary | ICD-10-CM | POA: Diagnosis not present

## 2014-01-20 DIAGNOSIS — E785 Hyperlipidemia, unspecified: Secondary | ICD-10-CM | POA: Diagnosis not present

## 2014-01-20 DIAGNOSIS — I4891 Unspecified atrial fibrillation: Secondary | ICD-10-CM | POA: Diagnosis not present

## 2014-01-20 DIAGNOSIS — I420 Dilated cardiomyopathy: Secondary | ICD-10-CM | POA: Diagnosis not present

## 2014-01-20 DIAGNOSIS — Z6826 Body mass index (BMI) 26.0-26.9, adult: Secondary | ICD-10-CM | POA: Diagnosis not present

## 2014-02-24 ENCOUNTER — Encounter (HOSPITAL_COMMUNITY): Payer: Self-pay | Admitting: *Deleted

## 2014-02-24 ENCOUNTER — Emergency Department (HOSPITAL_COMMUNITY)
Admission: EM | Admit: 2014-02-24 | Discharge: 2014-02-24 | Disposition: A | Discharge status: Home / Self Care | Payer: Medicare Other | Attending: Emergency Medicine | Admitting: Emergency Medicine

## 2014-02-24 DIAGNOSIS — R51 Headache: Secondary | ICD-10-CM | POA: Diagnosis not present

## 2014-02-24 DIAGNOSIS — M62838 Other muscle spasm: Secondary | ICD-10-CM

## 2014-02-24 DIAGNOSIS — I252 Old myocardial infarction: Secondary | ICD-10-CM | POA: Insufficient documentation

## 2014-02-24 DIAGNOSIS — Z862 Personal history of diseases of the blood and blood-forming organs and certain disorders involving the immune mechanism: Secondary | ICD-10-CM | POA: Insufficient documentation

## 2014-02-24 DIAGNOSIS — M542 Cervicalgia: Secondary | ICD-10-CM | POA: Diagnosis present

## 2014-02-24 DIAGNOSIS — Z79899 Other long term (current) drug therapy: Secondary | ICD-10-CM | POA: Diagnosis not present

## 2014-02-24 DIAGNOSIS — E039 Hypothyroidism, unspecified: Secondary | ICD-10-CM | POA: Insufficient documentation

## 2014-02-24 DIAGNOSIS — I2510 Atherosclerotic heart disease of native coronary artery without angina pectoris: Secondary | ICD-10-CM | POA: Insufficient documentation

## 2014-02-24 DIAGNOSIS — M62830 Muscle spasm of back: Secondary | ICD-10-CM | POA: Diagnosis not present

## 2014-02-24 MED ORDER — DIAZEPAM 2 MG PO TABS
2.0000 mg | ORAL_TABLET | Freq: Once | ORAL | Status: AC
  Administered 2014-02-24: 2 mg via ORAL
  Filled 2014-02-24: qty 1

## 2014-02-24 MED ORDER — HYDROCODONE-ACETAMINOPHEN 5-325 MG PO TABS
1.0000 | ORAL_TABLET | Freq: Once | ORAL | Status: AC
  Administered 2014-02-24: 1 via ORAL
  Filled 2014-02-24: qty 1

## 2014-02-24 MED ORDER — HYDROCODONE-ACETAMINOPHEN 5-325 MG PO TABS: 1.0000 | ORAL_TABLET | Freq: Four times a day (QID) | ORAL | Status: DC | PRN

## 2014-02-24 NOTE — Discharge Instructions (Signed)
Heat Therapy Heat therapy can help make painful, stiff muscles and joints feel better. Do not use heat on new injuries. Wait at least 48 hours after an injury to use heat. Do not use heat when you have aches or pains right after an activity. If you still have pain 3 hours after stopping the activity, then you may use heat. HOME CARE Wet heat pack  Soak a clean towel in warm water. Squeeze out the extra water.  Put the warm, wet towel in a plastic bag.  Place a thin, dry towel between your skin and the bag.  Put the heat pack on the area for 5 minutes, and check your skin. Your skin may be pink, but it should not be red.  Leave the heat pack on the area for 15 to 30 minutes.  Repeat this every 2 to 4 hours while awake. Do not use heat while you are sleeping. Warm water bath  Fill a tub with warm water.  Place the affected body part in the tub.  Soak the area for 20 to 40 minutes.  Repeat as needed. Hot water bottle  Fill the water bottle half full with hot water.  Press out the extra air. Close the cap tightly.  Place a dry towel between your skin and the bottle.  Put the bottle on the area for 5 minutes, and check your skin. Your skin may be pink, but it should not be red.  Leave the bottle on the area for 15 to 30 minutes.  Repeat this every 2 to 4 hours while awake. Electric heating pad  Place a dry towel between your skin and the heating pad.  Set the heating pad on low heat.  Put the heating pad on the area for 10 minutes, and check your skin. Your skin may be pink, but it should not be red.  Leave the heating pad on the area for 20 to 40 minutes.  Repeat this every 2 to 4 hours while awake.  Do not lie on the heating pad.  Do not fall asleep while using the heating pad.  Do not use the heating pad near water. GET HELP RIGHT AWAY IF:  You get blisters or red skin.  Your skin is puffy (swollen), or you lose feeling (numbness) in the affected  area.  You have any new problems.  Your problems are getting worse.  You have any questions or concerns. If you have any problems, stop using heat therapy until you see your doctor. MAKE SURE YOU:  Understand these instructions.  Will watch your condition.  Will get help right away if you are not doing well or get worse. Document Released: 06/11/2011 Document Reviewed: 05/12/2013 [facility] Patient Information 2015 [place]. This information is not intended to replace advice given to you by your health care provider. Make sure you discuss any questions you have with your health care provider. Torticollis, Acute You have suddenly (acutely) developed a twisted neck (torticollis). This is usually a self-limited condition. CAUSES  Acute torticollis may be caused by malposition, trauma or infection. Most commonly, acute torticollis is caused by sleeping in an awkward position. Torticollis may also be caused by the flexion, extension or twisting of the neck muscles beyond their normal position. Sometimes, the exact cause may not be known. SYMPTOMS  Usually, there is pain and limited movement of the neck. Your neck may twist to one side. DIAGNOSIS  The diagnosis is often made by physical examination. X-rays, CT scans  or MRIs may be done if there is a history of trauma or concern of infection. TREATMENT  For a common, stiff neck that develops during sleep, treatment is focused on relaxing the contracted neck muscle. Medications (including shots) may be used to treat the problem. Most cases resolve in several days. Torticollis usually responds to conservative physical therapy. If left untreated, the shortened and spastic neck muscle can cause deformities in the face and neck. Rarely, surgery is required. HOME CARE INSTRUCTIONS   Use over-the-counter and prescription medications as directed by your caregiver.  Do stretching exercises and massage the neck as directed by your  caregiver.  Follow up with physical therapy if needed and as directed by your caregiver. SEEK IMMEDIATE MEDICAL CARE IF:   You develop difficulty breathing or noisy breathing (stridor).  You drool, develop trouble swallowing or have pain with swallowing.  You develop numbness or weakness in the hands or feet.  You have changes in speech or vision.  You have problems with urination or bowel movements.  You have difficulty walking.  You have a fever.  You have increased pain. MAKE SURE YOU:   Understand these instructions.  Will watch your condition.  Will get help right away if you are not doing well or get worse. Document Released: 03/16/2000 Document Revised: 06/11/2011 Document Reviewed: 04/27/2009 [facility] Patient Information 2015 [place], [place]. This information is not intended to replace advice given to you by your health care provider. Make sure you discuss any questions you have with your health care provider.

## 2014-02-24 NOTE — ED Notes (Signed)
PA-C at bedside 

## 2014-02-24 NOTE — ED Notes (Signed)
Pt reports waking up this am with pain to base of neck and right side of head and right ear. Denies injury to neck. Pain has increases throughout the day. Ambulatory at triage.

## 2014-02-24 NOTE — ED Provider Notes (Signed)
CSN: [id number]     Arrival date & time 02/24/14  1837 History   First MD Initiated Contact with Patient 02/24/14 1844     Chief Complaint  Patient presents with  . Headache  . Neck Pain    Julie Lara is a 80 y.o. female with history of dilated cardiopathy, and chronic atrial fibrillation presents the ED complaining of right neck pain since waking up this morning. The patient reports she slept on her sofa last night and woke up with her right side of her neck hurting her. She reports her pain is worse with turning her neck. She reports using a heating pad which made her have some pain in her right ear and right eyebrow, but this has since resolved. She denies headache. She rates her pain at a 10 out of 10 that she describes as muscle tightness or cramping. The patient reports she's been working around the house today and this seemed to make her neck pain worse. The patient denies trauma her neck or head. She denies falls. Patient denies fevers, chills, headache, dizziness, lightheadedness, weakness, loss of coordination, difficulty walking, changes to her vision, chest pain, shortness of breath, palpitations, numbness, or tingling. The patient's daughter is with the patient and reports the patient has no changes to her baseline mobility, speech, or coordination.    (Consider location/radiation/quality/duration/timing/severity/associated sxs/prior Treatment) HPI  Past Medical History  Diagnosis Date  . 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 History of thyroidectomy   . Dilated cardiomyopathy   . Aortic insufficiency     mild to moderate  . PAC (premature atrial contraction)     Chronic  . Hypothyroidism   . Idiopathic cardiomyopathy 1999    EF 40% by Echo on 10/13/12  . Goiter   . NSTEMI (non-ST elevated myocardial infarction) July 2014    95% prox RCA s/p BMS  . Atrial fibrillation   . CAD (coronary artery disease)   . Acute blood loss anemia      November 2014   Past Surgical History  Procedure Laterality Date  . Cardiac catheterization    . Thyroidectomy  2008  . Total vaginal hysterectomy      Complete  . Coronary angioplasty with stent placement      BMS-95% prox RCA  . Biopsy of sacral lesion  2010  . Esophagogastroduodenoscopy N/A 02/22/2013    Procedure: ESOPHAGOGASTRODUODENOSCOPY (EGD);  Surgeon: Milus Banister, MD;  Location: [place];  Service: Endoscopy;  Laterality: N/A;  . Colonoscopy N/A 02/23/2013    Procedure: COLONOSCOPY;  Surgeon: Ladene Artist, MD;  Location: [facility] ENDOSCOPY;  Service: Endoscopy;  Laterality: N/A;   History reviewed. No pertinent family history. History  Substance Use Topics  . Smoking status: Never Smoker   . Smokeless tobacco: Never Used  . Alcohol Use: No   OB History    No data available     Review of Systems  Constitutional: Negative for fever and chills.  HENT: Positive for ear pain. Negative for congestion, ear discharge, hearing loss, sinus pressure, sneezing, sore throat, trouble swallowing and voice change.   Eyes: Negative for pain, redness and visual disturbance.  Respiratory: Negative for cough, shortness of breath and wheezing.   Cardiovascular: Negative for chest pain, palpitations and leg swelling.  Gastrointestinal: Negative for nausea, vomiting, abdominal pain and diarrhea.  Genitourinary: Negative for dysuria, hematuria and difficulty urinating.  Musculoskeletal: Positive for neck pain. Negative for back pain and gait problem.  Skin: Negative for pallor, rash and wound.  Neurological: Negative for dizziness, syncope, speech difficulty, weakness, light-headedness, numbness and headaches.  All other systems reviewed and are negative.     Allergies  Review of patient's allergies indicates no known allergies.  Home Medications   Prior to Admission medications   Medication Sig Start Date End Date Taking? Authorizing Provider  atorvastatin (LIPITOR) 40 MG  tablet Take 1 tablet (40 mg total) by mouth daily at 6 PM. 12/18/13  Yes Peter M Martinique, MD  Cholecalciferol (VITAMIN D PO) Take 1,000 Units by mouth daily.    Yes Historical Provider, MD  iron polysaccharides (FERREX 150) 150 MG capsule Take 150 mg by mouth daily.    Yes Historical Provider, MD  levothyroxine (SYNTHROID, LEVOTHROID) 25 MCG tablet Take 25 mcg by mouth daily before breakfast.   Yes Historical Provider, MD  metoprolol tartrate (LOPRESSOR) 25 MG tablet Take 1/2 tablet twice a day 12/03/13  Yes Peter M Martinique, MD  nitroGLYCERIN (NITROSTAT) 0.4 MG SL tablet Place 1 tablet (0.4 mg total) under the tongue every 5 (five) minutes as needed for chest pain. 10/15/12  Yes Roger A Arguello, PA-C  pantoprazole (PROTONIX) 40 MG tablet Take 1 tablet (40 mg total) by mouth 2 (two) times daily before a meal. 02/25/13  Yes Geoffery Lyons, MD  HYDROcodone-acetaminophen (NORCO) 5-325 MG per tablet Take 1 tablet by mouth every 6 (six) hours as needed for moderate pain. 02/24/14   Verda Cumins Keeon Zurn, PA-C   BP 165/69 mmHg  Pulse 70  Temp(Src) 97.9 F (36.6 C) (Oral)  Resp 27  SpO2 98% Physical Exam  Constitutional: She is oriented to person, place, and time. She appears well-developed and well-nourished. No distress.  HENT:  Head: Normocephalic and atraumatic.  Right Ear: External ear normal.  Left Ear: External ear normal.  Nose: Nose normal.  Mouth/Throat: Oropharynx is clear and moist. No oropharyngeal exudate.  Bilateral tympanic membranes are pearly gray without erythema or loss of landmarks.  Eyes: Conjunctivae and EOM are normal. Pupils are equal, round, and reactive to light. Right eye exhibits no discharge. Left eye exhibits no discharge. No scleral icterus.  Neck: Neck supple. No JVD present. No tracheal deviation present.  Patient's right neck musculature is tender to palpation and feels to be in spasm. Patient has pain with turning her head to the right and left. Her pain is worse  when turning to the left. Patient is able to turn her head 45 in each direction. Patient is able move her head up and down. There is no bony point tenderness. There is no crepitus, step-offs or deformity noted. No erythema, edema or ecchymosis noted to her neck.  Cardiovascular: Normal rate and intact distal pulses.  Exam reveals no gallop and no friction rub.   Patient is in an irregularly irregular rhythm.   Pulmonary/Chest: Effort normal and breath sounds normal. No respiratory distress. She has no wheezes. She has no rales. She exhibits no tenderness.  Abdominal: Soft. She exhibits no distension. There is no tenderness.  Musculoskeletal: She exhibits no edema.  Patient is able to ambulate in the room. Patient is spontaneously moving all extremities in a coordinated fashion exhibiting good strength. Patient's strength is 5 out of 5 bilateral upper extremities.   Lymphadenopathy:    She has no cervical adenopathy.  Neurological: She is alert and oriented to person, place, and time. She displays normal reflexes. No cranial nerve deficit. Coordination normal.  Cranial nerves II through XII are  intact bilaterally. Finger-to-nose intact bilaterally. Rapid alternating movements intact bilaterally. Sensation intact bilaterally. Equal grip strengths bilaterally. Patient strength is 5 out of 5 in her bilateral upper and lower extremities. The patient is able to ambulate in the room. Patient's speech is coherent. No pronator drift.  Skin: Skin is warm and dry. No rash noted. She is not diaphoretic. No erythema. No pallor.  Psychiatric: She has a normal mood and affect. Her behavior is normal.  Nursing note and vitals reviewed.   ED Course  Procedures (including critical care time) Labs Review Labs Reviewed - No data to display  Imaging Review No results found.   EKG Interpretation None      Filed Vitals:   02/24/14 1847 02/24/14 2106  BP: 170/92 165/69  Pulse: 81 70  Temp: 97.6 F (36.4  C) 97.9 F (36.6 C)  TempSrc: Oral Oral  Resp: 22 27  SpO2: 100% 98%     MDM   Meds given in ED:  Medications  diazepam (VALIUM) tablet 2 mg (2 mg Oral Given 02/24/14 1929)  HYDROcodone-acetaminophen (NORCO/VICODIN) 5-325 MG per tablet 1 tablet (1 tablet Oral Given 02/24/14 2023)    Discharge Medication List as of 02/24/2014  9:06 PM    START taking these medications   Details  HYDROcodone-acetaminophen (NORCO) 5-325 MG per tablet Take 1 tablet by mouth every 6 (six) hours as needed for moderate pain., Starting 02/24/2014, Until Discontinued, Print        Final diagnoses:  Neck muscle spasm   BINTI MOLLET is a 80 y.o. female with history of dilated cardiopathy, and chronic atrial fibrillation presents the ED complaining of right neck pain since waking up this morning. Patient reports her pain is worse with movement of her neck. Patient denies headache. Patient has dizziness, weakness, numbness, tingling, changes to her vision or loss or coordination.  The patient is able to ambulate. The patient is able to move her head 45 in each direction. The patient's right neck musculature is tender to palpation and feels to be in spasm. The patient denies any falls or trauma to her head or neck. Patient is neurologically intact. The patient's pain improved with Valium and Norco given in the ED. After discussing the patient and her family has decided we would discharge with prescription for Norco. I advised her to use caution when taking this medication as it could cause her drowsy. The patient's daughter stated that she'll be staying with her for the next couple days to keep an eye on her. Advised the patient to follow-up with her primary care physician beginning of next week. Advised patient return to the ED with new or worsening symptoms or new concerns.  The patient was discussed with Dr. Canary Brim who agrees with assessment and plan.       Hanley Hays, PA-C 02/24/14  [place], MD 02/24/14 2227

## 2014-02-24 NOTE — ED Notes (Signed)
Pt family expressed concern about pt having hx of a-fib. Placed pt on cardiac monitor, shows a-fib rate at 80.

## 2014-03-03 DIAGNOSIS — G243 Spasmodic torticollis: Secondary | ICD-10-CM | POA: Diagnosis not present

## 2014-03-03 DIAGNOSIS — Z6826 Body mass index (BMI) 26.0-26.9, adult: Secondary | ICD-10-CM | POA: Diagnosis not present

## 2014-03-11 ENCOUNTER — Encounter (HOSPITAL_COMMUNITY): Payer: Self-pay | Admitting: Cardiovascular Disease

## 2014-03-15 ENCOUNTER — Telehealth: Payer: Self-pay | Admitting: Cardiology

## 2014-03-15 ENCOUNTER — Other Ambulatory Visit: Payer: Self-pay

## 2014-03-15 MED ORDER — METOPROLOL TARTRATE 25 MG PO TABS: ORAL_TABLET | ORAL | Status: DC

## 2014-03-15 NOTE — Telephone Encounter (Signed)
Pt need a new prescription for her Metoprolol.Please call or send to [facility] asap.

## 2014-05-23 ENCOUNTER — Other Ambulatory Visit: Payer: Self-pay | Admitting: Cardiology

## 2014-05-24 ENCOUNTER — Encounter: Payer: Self-pay | Admitting: Cardiology

## 2014-05-24 ENCOUNTER — Ambulatory Visit (INDEPENDENT_AMBULATORY_CARE_PROVIDER_SITE_OTHER): Payer: Medicare Other | Admitting: Cardiology

## 2014-05-24 VITALS — BP 110/64 | HR 85 | Ht 64.0 in | Wt 143.9 lb

## 2014-05-24 DIAGNOSIS — I491 Atrial premature depolarization: Secondary | ICD-10-CM

## 2014-05-24 DIAGNOSIS — I2510 Atherosclerotic heart disease of native coronary artery without angina pectoris: Secondary | ICD-10-CM

## 2014-05-24 DIAGNOSIS — I5022 Chronic systolic (congestive) heart failure: Secondary | ICD-10-CM | POA: Diagnosis not present

## 2014-05-24 DIAGNOSIS — I351 Nonrheumatic aortic (valve) insufficiency: Secondary | ICD-10-CM | POA: Diagnosis not present

## 2014-05-24 NOTE — Patient Instructions (Signed)
Continue your current therapy  I will see you in 6 months.   
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
## 2014-05-27 DIAGNOSIS — I10 Essential (primary) hypertension: Secondary | ICD-10-CM | POA: Diagnosis not present

## 2014-05-27 DIAGNOSIS — E785 Hyperlipidemia, unspecified: Secondary | ICD-10-CM | POA: Diagnosis not present

## 2014-05-27 DIAGNOSIS — M5136 Other intervertebral disc degeneration, lumbar region: Secondary | ICD-10-CM | POA: Diagnosis not present

## 2014-05-27 DIAGNOSIS — I4891 Unspecified atrial fibrillation: Secondary | ICD-10-CM | POA: Diagnosis not present

## 2014-05-27 DIAGNOSIS — Z1389 Encounter for screening for other disorder: Secondary | ICD-10-CM | POA: Diagnosis not present

## 2014-05-27 DIAGNOSIS — Z6826 Body mass index (BMI) 26.0-26.9, adult: Secondary | ICD-10-CM | POA: Diagnosis not present

## 2014-08-25 ENCOUNTER — Other Ambulatory Visit: Payer: Self-pay | Admitting: Cardiology

## 2014-08-25 NOTE — Telephone Encounter (Signed)
Per note 2.2216

## 2014-09-16 DIAGNOSIS — E785 Hyperlipidemia, unspecified: Secondary | ICD-10-CM | POA: Diagnosis not present

## 2014-09-16 DIAGNOSIS — N390 Urinary tract infection, site not specified: Secondary | ICD-10-CM | POA: Diagnosis not present

## 2014-09-16 DIAGNOSIS — R930 Abnormal findings on diagnostic imaging of skull and head, not elsewhere classified: Secondary | ICD-10-CM | POA: Diagnosis not present

## 2014-09-16 DIAGNOSIS — Z8542 Personal history of malignant neoplasm of other parts of uterus: Secondary | ICD-10-CM | POA: Diagnosis not present

## 2014-09-16 DIAGNOSIS — R413 Other amnesia: Secondary | ICD-10-CM | POA: Diagnosis not present

## 2014-09-16 DIAGNOSIS — Z6825 Body mass index (BMI) 25.0-25.9, adult: Secondary | ICD-10-CM | POA: Diagnosis not present

## 2014-09-16 DIAGNOSIS — R8290 Unspecified abnormal findings in urine: Secondary | ICD-10-CM | POA: Diagnosis not present

## 2014-09-16 DIAGNOSIS — R410 Disorientation, unspecified: Secondary | ICD-10-CM | POA: Diagnosis not present

## 2014-09-16 DIAGNOSIS — I10 Essential (primary) hypertension: Secondary | ICD-10-CM | POA: Diagnosis not present

## 2014-09-16 DIAGNOSIS — I6782 Cerebral ischemia: Secondary | ICD-10-CM | POA: Diagnosis not present

## 2014-09-16 DIAGNOSIS — E038 Other specified hypothyroidism: Secondary | ICD-10-CM | POA: Diagnosis not present

## 2014-09-16 DIAGNOSIS — D649 Anemia, unspecified: Secondary | ICD-10-CM | POA: Diagnosis not present

## 2014-09-16 DIAGNOSIS — M5136 Other intervertebral disc degeneration, lumbar region: Secondary | ICD-10-CM | POA: Diagnosis not present

## 2014-09-16 DIAGNOSIS — I4891 Unspecified atrial fibrillation: Secondary | ICD-10-CM | POA: Diagnosis not present

## 2014-09-17 DIAGNOSIS — M5124 Other intervertebral disc displacement, thoracic region: Secondary | ICD-10-CM | POA: Diagnosis not present

## 2014-09-17 DIAGNOSIS — M9911 Subluxation complex (vertebral) of cervical region: Secondary | ICD-10-CM | POA: Diagnosis not present

## 2014-09-17 DIAGNOSIS — M47812 Spondylosis without myelopathy or radiculopathy, cervical region: Secondary | ICD-10-CM | POA: Diagnosis not present

## 2014-09-30 DIAGNOSIS — I4891 Unspecified atrial fibrillation: Secondary | ICD-10-CM | POA: Diagnosis not present

## 2014-09-30 DIAGNOSIS — I420 Dilated cardiomyopathy: Secondary | ICD-10-CM | POA: Diagnosis not present

## 2014-09-30 DIAGNOSIS — R930 Abnormal findings on diagnostic imaging of skull and head, not elsewhere classified: Secondary | ICD-10-CM | POA: Diagnosis not present

## 2014-09-30 DIAGNOSIS — E785 Hyperlipidemia, unspecified: Secondary | ICD-10-CM | POA: Diagnosis not present

## 2014-09-30 DIAGNOSIS — M5136 Other intervertebral disc degeneration, lumbar region: Secondary | ICD-10-CM | POA: Diagnosis not present

## 2014-09-30 DIAGNOSIS — Z6825 Body mass index (BMI) 25.0-25.9, adult: Secondary | ICD-10-CM | POA: Diagnosis not present

## 2014-10-21 ENCOUNTER — Ambulatory Visit: Payer: Medicare Other | Attending: Internal Medicine | Admitting: Physical Therapy

## 2014-10-21 ENCOUNTER — Encounter: Payer: Self-pay | Admitting: Physical Therapy

## 2014-10-21 DIAGNOSIS — R2681 Unsteadiness on feet: Secondary | ICD-10-CM | POA: Diagnosis not present

## 2014-10-21 DIAGNOSIS — R269 Unspecified abnormalities of gait and mobility: Secondary | ICD-10-CM | POA: Diagnosis not present

## 2014-10-21 DIAGNOSIS — R531 Weakness: Secondary | ICD-10-CM | POA: Diagnosis not present

## 2014-10-21 NOTE — Therapy (Signed)
[facility] [address] [place], [place], [address] Phone: [phone]   Fax:  [phone]  Physical Therapy Evaluation  Patient Details  Name: Julie Lara MRN: [id number] Date of Birth: 12/25/1920 Referring Provider:  Burnard Bunting, MD  Encounter Date: 10/21/2014      PT End of Session - 10/21/14 1220    Visit Number 1   Number of Visits 17  eval + 16 visits   Date for PT Re-Evaluation 12/20/14   Authorization Type Medicare - G Codes every 10 visits   PT Start Time 1105   PT Stop Time 1155   PT Time Calculation (min) 50 min   Equipment Utilized During Treatment Gait belt   Activity Tolerance Patient tolerated treatment well   Behavior During Therapy [facility] for tasks assessed/performed      Past Medical History  Diagnosis Date  . 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 History of thyroidectomy   . Dilated cardiomyopathy   . Aortic insufficiency     mild to moderate  . PAC (premature atrial contraction)     Chronic  . Hypothyroidism   . Idiopathic cardiomyopathy 1999    EF 40% by Echo on 10/13/12  . Goiter   . NSTEMI (non-ST elevated myocardial infarction) July 2014    95% prox RCA s/p BMS  . Atrial fibrillation   . CAD (coronary artery disease)   . Acute blood loss anemia     November 2014    Past Surgical History  Procedure Laterality Date  . Cardiac catheterization    . Thyroidectomy  2008  . Total vaginal hysterectomy      Complete  . Coronary angioplasty with stent placement      BMS-95% prox RCA  . Biopsy of sacral lesion  2010  . Esophagogastroduodenoscopy N/A 02/22/2013    Procedure: ESOPHAGOGASTRODUODENOSCOPY (EGD);  Surgeon: Milus Banister, MD;  Location: [place];  Service: Endoscopy;  Laterality: N/A;  . Colonoscopy N/A 02/23/2013    Procedure: COLONOSCOPY;  Surgeon: Ladene Artist, MD;  Location: [facility] ENDOSCOPY;  Service: Endoscopy;  Laterality: N/A;   . Left heart catheterization with coronary angiogram N/A 10/14/2012    Procedure: LEFT HEART CATHETERIZATION WITH CORONARY ANGIOGRAM;  Surgeon: Wellington Hampshire, MD;  Location: [place] CATH LAB;  Service: Cardiovascular;  Laterality: N/A;  . Percutaneous coronary stent intervention (pci-s) Right 10/14/2012    Procedure: PERCUTANEOUS CORONARY STENT INTERVENTION (PCI-S);  Surgeon: Wellington Hampshire, MD;  Location: [facility] CATH LAB;  Service: Cardiovascular;  Laterality: Right;    There were no vitals filed for this visit.  Visit Diagnosis:  Abnormality of gait - Plan: PT plan of care cert/re-cert  Weakness generalized - Plan: PT plan of care cert/re-cert  Unsteadiness - Plan: PT plan of care cert/re-cert      Subjective Assessment - 10/21/14 1118    Subjective "I'm here because of my right leg - it makes me have trouble walking. I try my best to walk straight but that right leg throws me off." Pt reports difficulty initiating RLE step when going down steps. Pt also reports chronic pain in lower back. Currently, pt denies back pain but reports that back feels "tired," stating "I just have to lay down every once in a while." After this PT recommended use of AD, pt replied, "Please don't tell my daughter that."   Pertinent History abnormal brain MRI (Per MD note, cervical spine lesions are "likely malignant" but  no sypmtoms),  A-fib, degenerative disc disease, CHF, cardiomyopathy, anemia, HTN   Limitations House hold activities;Walking   Patient Stated Goals "Work on my right leg to make it stronger and not walk around like I'm drunk."   Currently in Pain? No/denies            [facility] PT Assessment - 10/21/14 0001    Assessment   Medical Diagnosis gait instability   Onset Date/Surgical Date 10/01/14   Precautions   Precautions Fall   Restrictions   Weight Bearing Restrictions No   Balance Screen   Has the patient fallen in the past 6 months No   Has the patient had a decrease in activity level  because of a fear of falling?  Yes   Is the patient reluctant to leave their home because of a fear of falling?  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 assessed   Observation/Other Assessments   Other Surveys  Other Surveys   Activities of Balance Confidence Scale (ABC Scale)  44.4   Sensation   Light Touch Appears Intact   Coordination   Gross Motor Movements are Fluid and Coordinated No  Impaired LE coordination during standing/ambulation   Heel Shin Test grossly WNL and symmetrical in BLE's   ROM / Strength   AROM / PROM / Strength Strength   Strength   Overall Strength Deficits   Overall Strength Comments L hip gross 4/5 in all planes; R hip/knee flexion 4-/5, 3+/5 R hip extension and R hip ABD  R terminal knee extension 3-/5   Flexibility   Soft Tissue Assessment /Muscle Length --   Transfers   Transfers Sit to Stand;Stand to Sit   Sit to Stand 5: Supervision   Sit to Stand Details (indicate cue type and reason) without UE's   Stand to Sit 5: Supervision   Stand to Sit Details without UE's   Ambulation/Gait   Ambulation/Gait Yes   Assistive device None   Gait Pattern Step-through pattern;Narrow base of support;Trunk flexed;Poor foot clearance - right;Decreased trunk rotation;Trendelenburg;Scissoring;Decreased dorsiflexion - left;Decreased hip/knee flexion - right;Decreased hip/knee flexion - left;Decreased stride length  R Trendelenburg   Ambulation Surface Level;Indoor   Gait velocity 2.64 ft/sec   Stairs Yes   Stairs Assistance 5: Supervision   Stair Management Technique Two rails;Alternating pattern;Forwards   Number of Stairs 4   Height of Stairs 6   Curb 4: Min assist  min guard without AD   Functional Gait  Assessment   Gait assessed  Yes   Gait Level Surface Walks 20 ft, slow speed, abnormal gait pattern, evidence for imbalance or deviates 10-15 in  outside of the 12 in walkway width. Requires more than 7 sec to ambulate 20 ft.   Change in Gait Speed Able to change speed, demonstrates mild gait deviations, deviates 6-10 in outside of the 12 in walkway width, or no gait deviations, unable to achieve a major change in velocity, or uses a change in velocity, or uses an assistive device.   Gait with Horizontal Head Turns Performs head turns with moderate changes in gait velocity, slows down, deviates 10-15 in outside 12 in walkway width but recovers, can continue to walk.   Gait with Vertical Head Turns Performs task with slight change in gait velocity (eg, minor disruption to smooth gait path), deviates 6 - 10 in outside 12 in walkway width  or uses assistive device   Gait and Pivot Turn Turns slowly, requires verbal cueing, or requires several small steps to catch balance following turn and stop   Step Over Obstacle Cannot perform without assistance.  min guard, encouragement required   Gait with Narrow Base of Support Ambulates less than 4 steps heel to toe or cannot perform without assistance.   Gait with Eyes Closed Walks 20 ft, slow speed, abnormal gait pattern, evidence for imbalance, deviates 10-15 in outside 12 in walkway width. Requires more than 9 sec to ambulate 20 ft.   Ambulating Backwards Walks 20 ft, slow speed, abnormal gait pattern, evidence for imbalance, deviates 10-15 in outside 12 in walkway width.   Steps Alternating feet, must use rail.   Total Score 11                           PT Education - 10/21/14 1210    Education provided Yes   Education Details Pt eval findings, goals, and POC. Recommending use of RW due to significant gait instability.   Person(s) Educated Patient   Methods Explanation   Comprehension Verbalized understanding          PT Short Term Goals - 10/21/14 1212    PT SHORT TERM GOAL #1   Title Pt will perform home exercises with mod I using paper handout to indicate safe daily  compliance with HEP. Target date: 11/18/14   Status New   PT SHORT TERM GOAL #2   Title Pt will negotiate four small (4.5") steps with no rails and mod I using LRAD to enable pt to safely enter/exit home.Target date: 11/18/14   Status New   PT SHORT TERM GOAL #3   Title Pt will negotiate standard curb step without rail with mod I using LRAD to enable pt to enter primary entrance of home, to indicate safety with community mobility.Target date: 11/18/14   Status New   PT SHORT TERM GOAL #4   Title Pt will verbalize understanding of fall prevention strategies to decrease fall risk within home. Target date: 11/18/14   Status New           PT Long Term Goals - 10/21/14 1214    PT LONG TERM GOAL #1   Title Pt will increase FGA score from 11/30 to 19/30 to indicate significant improvement in dynamic gait stability. Target date: 12/16/14   Status New   PT LONG TERM GOAL #2   Title Pt will independently ambulate >200' over level, indoor surfaces with effective obstacle negotiation, no overt LOB to indicate safety with household mobility. Target date: 12/16/14   Status New   PT LONG TERM GOAL #3   Title Pt will ambulate >500' over unlevel, outdoor surfaces (grass and asphalt) with mod I using LRAD with no overt LOB to progress toward sfety with community mobility. Target date: 12/16/14   Status New   PT LONG TERM GOAL #4   Title Pt will improve Activities-specific Balance Confidence (ABC) scale score by 10 points from baseline to indicate improved balance confidence. Target date: 12/16/14   Baseline Baseline score: 44.4%   Status New               Plan - 10/21/14 1221    Clinical Impression Statement Pt is a 81 y/o F presenting to outpatient PT to address gait instability. Recent MRI of brain revealed incidental finding of lesions (likely malignant) on cervical spine. PT evaluation reveals  the following impairments: gait abnormality, decreased gait stability; significant risk of falling, as  indicated by FGA score of 11/30; generalized weakness (most prominent in R hip); postural abnormality. Pt will benefit from skilled PT twice per week for 8 weeks to address said impairments.   Pt will benefit from skilled therapeutic intervention in order to improve on the following deficits Abnormal gait;Decreased balance;Decreased strength;Postural dysfunction;Pain;Decreased coordination;Decreased knowledge of use of DME   Rehab Potential Good   PT Frequency 2x / week   PT Duration 8 weeks   PT Treatment/Interventions ADLs/Self Care Home Management;Therapeutic activities;Therapeutic exercise;Neuromuscular re-education;Balance training;Cognitive remediation;Patient/family education;Orthotic Fit/Training;Gait training;Stair training;DME Instruction;Manual techniques;Vestibular;Functional mobility training   PT Next Visit Plan Fall prevention strategies. Establish HEP. Reiterate recommendation for AD. Trial using SPC (if pt amenable). Dynamic standing balance/gait.   Consulted and Agree with Plan of Care Patient          G-Codes - 2014-11-05 1204    Functional Assessment Tool Used FGA: 11/30   Functional Limitation Mobility: Walking and moving around   Mobility: Walking and Moving Around Current Status [phone]) At least 60 percent but less than 80 percent impaired, limited or restricted   Mobility: Walking and Moving Around Goal Status [phone]) At least 20 percent but less than 40 percent impaired, limited or restricted       Problem List Patient Active Problem List   Diagnosis Date Noted  . Acute blood loss anemia 03/12/2013  . CAD (coronary artery disease)   . Benign neoplasm of colon 02/23/2013  . Nonspecific abnormal finding in stool contents 02/23/2013  . Near syncope 02/21/2013  . Anemia 02/21/2013  . Acute on chronic renal failure 02/21/2013  . Chronic systolic congestive heart failure 02/21/2013  . GI bleed 02/21/2013  . Chronic anticoagulation 02/21/2013  . Coronary artery  disease with history of myocardial infarction without history of CABG 02/21/2013  . Resting chest pain 10/27/2012  . Hypothyroidism 10/15/2012  . Atrial fibrillation 10/15/2012  . CKD (chronic kidney disease), stage III 10/15/2012  . UTI (urinary tract infection) 10/15/2012  . Dilated cardiomyopathy   . Aortic insufficiency   . PAC (premature atrial contraction)   . Chest pain 04/02/2010    Billie Ruddy, PT, DPT [facility] [address] [place], [place], [address] Phone: [phone]   Fax:  [phone] 11/05/2014, 5:13 PM

## 2014-10-25 ENCOUNTER — Ambulatory Visit: Payer: Medicare Other | Admitting: Physical Therapy

## 2014-10-25 DIAGNOSIS — R269 Unspecified abnormalities of gait and mobility: Secondary | ICD-10-CM | POA: Diagnosis not present

## 2014-10-25 DIAGNOSIS — R2681 Unsteadiness on feet: Secondary | ICD-10-CM | POA: Diagnosis not present

## 2014-10-25 DIAGNOSIS — R531 Weakness: Secondary | ICD-10-CM | POA: Diagnosis not present

## 2014-10-25 NOTE — Therapy (Signed)
[facility] [address] [place], [place], [address] Phone: [phone]   Fax:  [phone]  Physical Therapy Treatment  Patient Details  Name: Julie Lara MRN: [id number] Date of Birth: 01-12-1921 Referring Provider:  Burnard Bunting, MD  Encounter Date: 10/25/2014      PT End of Session - 10/25/14 1816    Visit Number 2   Number of Visits 17   Date for PT Re-Evaluation 12/20/14   Authorization Type Medicare - G Codes every 10 visits   PT Start Time 0848   PT Stop Time 0932   PT Time Calculation (min) 44 min   Equipment Utilized During Treatment Gait belt   Activity Tolerance Patient tolerated treatment well   Behavior During Therapy [facility] for tasks assessed/performed      Past Medical History  Diagnosis Date  . 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 History of thyroidectomy   . Dilated cardiomyopathy   . Aortic insufficiency     mild to moderate  . PAC (premature atrial contraction)     Chronic  . Hypothyroidism   . Idiopathic cardiomyopathy 1999    EF 40% by Echo on 10/13/12  . Goiter   . NSTEMI (non-ST elevated myocardial infarction) July 2014    95% prox RCA s/p BMS  . Atrial fibrillation   . CAD (coronary artery disease)   . Acute blood loss anemia     November 2014    Past Surgical History  Procedure Laterality Date  . Cardiac catheterization    . Thyroidectomy  2008  . Total vaginal hysterectomy      Complete  . Coronary angioplasty with stent placement      BMS-95% prox RCA  . Biopsy of sacral lesion  2010  . Esophagogastroduodenoscopy N/A 02/22/2013    Procedure: ESOPHAGOGASTRODUODENOSCOPY (EGD);  Surgeon: Milus Banister, MD;  Location: [place];  Service: Endoscopy;  Laterality: N/A;  . Colonoscopy N/A 02/23/2013    Procedure: COLONOSCOPY;  Surgeon: Ladene Artist, MD;  Location: [facility] ENDOSCOPY;  Service: Endoscopy;  Laterality: N/A;  . Left heart  catheterization with coronary angiogram N/A 10/14/2012    Procedure: LEFT HEART CATHETERIZATION WITH CORONARY ANGIOGRAM;  Surgeon: Wellington Hampshire, MD;  Location: [facility] CATH LAB;  Service: Cardiovascular;  Laterality: N/A;  . Percutaneous coronary stent intervention (pci-s) Right 10/14/2012    Procedure: PERCUTANEOUS CORONARY STENT INTERVENTION (PCI-S);  Surgeon: Wellington Hampshire, MD;  Location: [facility] CATH LAB;  Service: Cardiovascular;  Laterality: Right;    There were no vitals filed for this visit.  Visit Diagnosis:  Abnormality of gait  Weakness generalized  Unsteadiness      Subjective Assessment - 10/25/14 0851    Subjective Pt denies falls, reports no pain in lower back.   Pertinent History abnormal brain MRI (Per MD note, cervical spine lesions are "likely malignant" but no sypmtoms),  A-fib, degenerative disc disease, CHF, cardiomyopathy, anemia, HTN   Limitations House hold activities;Walking   Patient Stated Goals "Work on my right leg to make it stronger and not walk around like I'm drunk."   Currently in Pain? No/denies               Treatment   Therapeutic Exercises: - Instructed pt in HEP. Pt with effective return demonstration of exercises. All repetitions performed to pt fatigue. See Patient Instructions for rep/set number, further detail on all exercises.  Self Care:  - Discussed fall prevention strategies.  See Pt Education for further details.         [facility] PT Treatment/Exercise - 10/25/14 0001    Transfers   Transfers Sit to Stand;Stand to Sit   Sit to Stand 6: Modified independent (Device/Increase time)   Stand to Sit 6: Modified independent (Device/Increase time)   Ambulation/Gait   Ambulation/Gait Yes   Ambulation/Gait Assistance 5: Supervision;4: Min guard   Ambulation/Gait Assistance Details Manual facilitation of sequencing with SPC ; cueing for wider BOS, increased R hip/knee flexion during RLE advancement   Ambulation Distance (Feet) 450  Feet   Assistive device None;Straight cane   Gait Pattern Step-through pattern;Narrow base of support;Trunk flexed;Poor foot clearance - right;Decreased trunk rotation;Trendelenburg;Scissoring;Decreased dorsiflexion - left;Decreased hip/knee flexion - right;Decreased hip/knee flexion - left;Decreased stride length  R Trendelenburg   Ambulation Surface Level;Indoor                PT Education - 10/25/14 [phone]    Education provided Yes   Education Details Fall preventions strategies. Established HEP. See Pt instructions.   Person(s) Educated Patient   Methods Explanation;Demonstration;Verbal cues;Handout   Comprehension Verbalized understanding;Returned demonstration          PT Short Term Goals - 10/25/14 1818    PT SHORT TERM GOAL #1   Title Pt will perform home exercises with mod I using paper handout to indicate safe daily compliance with HEP. Target date: 11/18/14   Baseline Initial HEP established 7/25.   Status On-going   PT SHORT TERM GOAL #2   Title Pt will negotiate four small (4.5") steps with no rails and mod I using LRAD to enable pt to safely enter/exit home.Target date: 11/18/14   Status On-going   PT SHORT TERM GOAL #3   Title Pt will negotiate standard curb step without rail with mod I using LRAD to enable pt to enter primary entrance of home, to indicate safety with community mobility.Target date: 11/18/14   Status On-going   PT SHORT TERM GOAL #4   Title Pt will verbalize understanding of fall prevention strategies to decrease fall risk within home. Target date: 11/18/14   Baseline Discussed and provided handout on 7/25.   Status On-going           PT Long Term Goals - 10/25/14 1819    PT LONG TERM GOAL #1   Title Pt will increase FGA score from 11/30 to 19/30 to indicate significant improvement in dynamic gait stability. Target date: 12/16/14   Status On-going   PT LONG TERM GOAL #2   Title Pt will independently ambulate >200' over level, indoor  surfaces with effective obstacle negotiation, no overt LOB to indicate safety with household mobility. Target date: 12/16/14   Status On-going   PT LONG TERM GOAL #3   Title Pt will ambulate >500' over unlevel, outdoor surfaces (grass and asphalt) with mod I using LRAD with no overt LOB to progress toward sfety with community mobility. Target date: 12/16/14   Status On-going   PT LONG TERM GOAL #4   Title Pt will improve Activities-specific Balance Confidence (ABC) scale score by 10 points from baseline to indicate improved balance confidence. Target date: 12/16/14   Baseline Baseline score: 44.4%   Status On-going               Plan - 10/25/14 1816    Clinical Impression Statement HEP established; fall prevention strategies handout issued. Instructed pt in safe/proper sequencing with SPC. Reiterated recommendation for use of AD for  all functional mobility. Continue per POC.   Pt will benefit from skilled therapeutic intervention in order to improve on the following deficits Abnormal gait;Decreased balance;Decreased strength;Postural dysfunction;Pain;Decreased coordination;Decreased knowledge of use of DME   Rehab Potential Good   PT Frequency 2x / week   PT Duration 8 weeks   PT Treatment/Interventions ADLs/Self Care Home Management;Therapeutic activities;Therapeutic exercise;Neuromuscular re-education;Balance training;Cognitive remediation;Patient/family education;Orthotic Fit/Training;Gait training;Stair training;DME Instruction;Manual techniques;Vestibular;Functional mobility training   PT Next Visit Plan Check HEP safety/compliance with HEP. Ask if pt utilizing [place]. Teach pt to negotiate curb with SPC.   Consulted and Agree with Plan of Care Patient        Problem List Patient Active Problem List   Diagnosis Date Noted  . Acute blood loss anemia 03/12/2013  . CAD (coronary artery disease)   . Benign neoplasm of colon 02/23/2013  . Nonspecific abnormal finding in stool  contents 02/23/2013  . Near syncope 02/21/2013  . Anemia 02/21/2013  . Acute on chronic renal failure 02/21/2013  . Chronic systolic congestive heart failure 02/21/2013  . GI bleed 02/21/2013  . Chronic anticoagulation 02/21/2013  . Coronary artery disease with history of myocardial infarction without history of CABG 02/21/2013  . Resting chest pain 10/27/2012  . Hypothyroidism 10/15/2012  . Atrial fibrillation 10/15/2012  . CKD (chronic kidney disease), stage III 10/15/2012  . UTI (urinary tract infection) 10/15/2012  . Dilated cardiomyopathy   . Aortic insufficiency   . PAC (premature atrial contraction)   . Chest pain 04/02/2010    Billie Ruddy, PT, DPT [facility] [address] [place], [place], [address] Phone: [phone]   Fax:  [phone] 10/25/2014, 6:20 PM

## 2014-10-25 NOTE — Patient Instructions (Addendum)
Fall Prevention and Home Safety Falls cause injuries and can affect all age groups. It is possible to use preventive measures to significantly decrease the likelihood of falls. There are many simple measures which can make your home safer and prevent falls. OUTDOORS  Repair cracks and edges of walkways and driveways.  Remove high doorway thresholds.  Trim shrubbery on the main path into your home.  Have good outside lighting.  Clear walkways of tools, rocks, debris, and clutter.  Check that handrails are not broken and are securely fastened. Both sides of steps should have handrails.  Have leaves, snow, and ice cleared regularly.  Use sand or salt on walkways during winter months.  In the garage, clean up grease or oil spills. BATHROOM  Install night lights.  Install grab bars by the toilet and in the tub and shower.  Use non-skid mats or decals in the tub or shower.  Place a plastic non-slip stool in the shower to sit on, if needed.  Keep floors dry and clean up all water on the floor immediately.  Remove soap buildup in the tub or shower on a regular basis.  Secure bath mats with non-slip, double-sided rug tape.  Remove throw rugs and tripping hazards from the floors. BEDROOMS  Install night lights.  Make sure a bedside light is easy to reach.  Do not use oversized bedding.  Keep a telephone by your bedside.  Have a firm chair with side arms to use for getting dressed.  Remove throw rugs and tripping hazards from the floor. KITCHEN  Keep handles on pots and pans turned toward the center of the stove. Use back burners when possible.  Clean up spills quickly and allow time for drying.  Avoid walking on wet floors.  Avoid hot utensils and knives.  Position shelves so they are not too high or low.  Place commonly used objects within easy reach.  If necessary, use a sturdy step stool with a grab bar when reaching.  Keep electrical cables out of the  way.  Do not use floor polish or wax that makes floors slippery. If you must use wax, use non-skid floor wax.  Remove throw rugs and tripping hazards from the floor. STAIRWAYS  Never leave objects on stairs.  Place handrails on both sides of stairways and use them. Fix any loose handrails. Make sure handrails on both sides of the stairways are as long as the stairs.  Check carpeting to make sure it is firmly attached along stairs. Make repairs to worn or loose carpet promptly.  Avoid placing throw rugs at the top or bottom of stairways, or properly secure the rug with carpet tape to prevent slippage. Get rid of throw rugs, if possible.  Have an electrician put in a light switch at the top and bottom of the stairs. OTHER FALL PREVENTION TIPS  Wear low-heel or rubber-soled shoes that are supportive and fit well. Wear closed toe shoes.  When using a stepladder, make sure it is fully opened and both spreaders are firmly locked. Do not climb a closed stepladder.  Add color or contrast paint or tape to grab bars and handrails in your home. Place contrasting color strips on first and last steps.  Learn and use mobility aids as needed. Install an electrical emergency response system.  Turn on lights to avoid dark areas. Replace light bulbs that burn out immediately. Get light switches that glow.  Arrange furniture to create clear pathways. Keep furniture in the same place.    Firmly attach carpet with non-skid or double-sided tape.  Eliminate uneven floor surfaces.  Select a carpet pattern that does not visually hide the edge of steps.  Be aware of all pets. OTHER HOME SAFETY TIPS  Set the water temperature for 120 F (48.8 C).  Keep emergency numbers on or near the telephone.  Keep smoke detectors on every level of the home and near sleeping areas. Document Released: 03/09/2002 Document Revised: 09/18/2011 Document Reviewed: 06/08/2011 [facility] Patient Information 2015  [place], [place]. This information is not intended to replace advice given to you by your health care provider. Make sure you discuss any questions you have with your health care provider.  Bridging with Band   Place the YELLOW band around both legs (as pictured), just above the knees.Lie on your back with both knees bent. Press into strap with knees and lift your bottom. Hold for 2 seconds. Relax. Repeat __10__ times. Do 3 sets per day.  Abduction: Clam (Eccentric) - Side-Lying   Lie on side with knees bent. Lift top knee, keeping feet together. Keep trunk steady (Don't let your top roll backwards). . Slowly lower for 3-5 seconds. _10__ reps per set, _3_ sets per day on each side.    Walk along the length of your kitchen countertop with one hand on the countertop for safety/support, raising both knees as high as you can. Walk the length of your countertop (or around your stable dining room table) 5 times, twice per day. Progress from holding onto with one hand to using two fingers for support.

## 2014-10-28 ENCOUNTER — Ambulatory Visit: Payer: Medicare Other | Admitting: Physical Therapy

## 2014-10-28 ENCOUNTER — Encounter: Payer: Self-pay | Admitting: Physical Therapy

## 2014-10-28 DIAGNOSIS — R269 Unspecified abnormalities of gait and mobility: Secondary | ICD-10-CM | POA: Diagnosis not present

## 2014-10-28 DIAGNOSIS — R531 Weakness: Secondary | ICD-10-CM

## 2014-10-28 DIAGNOSIS — R2681 Unsteadiness on feet: Secondary | ICD-10-CM | POA: Diagnosis not present

## 2014-10-28 NOTE — Therapy (Signed)
[facility] [address] [place], [place], [address] Phone: [phone]   Fax:  [phone]  Physical Therapy Treatment  Patient Details  Name: Julie Lara MRN: [id number] Date of Birth: [birth year] Referring Provider:  Burnard Bunting, MD  Encounter Date: 10/28/2014      PT End of Session - 10/28/14 0852    Visit Number 3   Number of Visits 17   Date for PT Re-Evaluation 12/20/14   Authorization Type Medicare - G Codes every 10 visits   PT Start Time 0845   PT Stop Time 0927   PT Time Calculation (min) 42 min   Equipment Utilized During Treatment Gait belt   Activity Tolerance Patient tolerated treatment well   Behavior During Therapy [facility] for tasks assessed/performed      Past Medical History  Diagnosis Date  . 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 History of thyroidectomy   . Dilated cardiomyopathy   . Aortic insufficiency     mild to moderate  . PAC (premature atrial contraction)     Chronic  . Hypothyroidism   . Idiopathic cardiomyopathy 1999    EF 40% by Echo on 10/13/12  . Goiter   . NSTEMI (non-ST elevated myocardial infarction) July 2014    95% prox RCA s/p BMS  . Atrial fibrillation   . CAD (coronary artery disease)   . Acute blood loss anemia     November 2014    Past Surgical History  Procedure Laterality Date  . Cardiac catheterization    . Thyroidectomy  2008  . Total vaginal hysterectomy      Complete  . Coronary angioplasty with stent placement      BMS-95% prox RCA  . Biopsy of sacral lesion  2010  . Esophagogastroduodenoscopy N/A 02/22/2013    Procedure: ESOPHAGOGASTRODUODENOSCOPY (EGD);  Surgeon: Milus Banister, MD;  Location: [facility];  Service: Endoscopy;  Laterality: N/A;  . Colonoscopy N/A 02/23/2013    Procedure: COLONOSCOPY;  Surgeon: Ladene Artist, MD;  Location: [facility] ENDOSCOPY;  Service: Endoscopy;  Laterality: N/A;  . Left heart  catheterization with coronary angiogram N/A 10/14/2012    Procedure: LEFT HEART CATHETERIZATION WITH CORONARY ANGIOGRAM;  Surgeon: Wellington Hampshire, MD;  Location: [facility] CATH LAB;  Service: Cardiovascular;  Laterality: N/A;  . Percutaneous coronary stent intervention (pci-s) Right 10/14/2012    Procedure: PERCUTANEOUS CORONARY STENT INTERVENTION (PCI-S);  Surgeon: Wellington Hampshire, MD;  Location: [facility] CATH LAB;  Service: Cardiovascular;  Laterality: Right;    There were no vitals filed for this visit.  Visit Diagnosis:  Abnormality of gait  Weakness generalized  Unsteadiness      Subjective Assessment - 10/28/14 0851    Subjective No new complaints. No falls or pain to report. Has not done the HEP "I walked alot instead". Also, not using a cane as recommended. Reports "I plan to get it together this week"   Currently in Pain? No/denies          [facility] Adult PT Treatment/Exercise - 10/28/14 0902    Ambulation/Gait   Ambulation/Gait Yes   Ambulation/Gait Assistance 5: Supervision   Ambulation/Gait Assistance Details cues to use cane, vs just carrying it with gait. cues on seqeunce with gait, to increase base of support and for increased stride with increased foot clearance.   Ambulation Distance (Feet) 450 Feet   Assistive device Straight cane   Gait Pattern Step-through pattern;Narrow base of support;Trunk flexed;Poor foot  clearance - right;Decreased trunk rotation;Trendelenburg;Scissoring;Decreased dorsiflexion - left;Decreased hip/knee flexion - right;Decreased hip/knee flexion - left;Decreased stride length   Ambulation Surface Level;Indoor   Ramp 4: Min assist   Ramp Details (indicate cue type and reason) mod cues on sequence, posture and technique   Curb 4: Min assist   Curb Details (indicate cue type and reason) mod cues on sequence and technique      Reviewed HEP issued on previous session. Mod cues needed for correct ex form/technique. Performed x 10 reps (mat exercises) and 3  lap (walking marching).  Neuro Re-ed: at counter with single UE support, cues on posture and ex form  - high knee marching fwd/bwd x 3 laps each way (from HEP) - tandem gait fwd/bwd x 3 laps each way - toe walk fwd/bwd x 3 laps each way - heel walk fwd/bwd x 3 laps each way        PT Short Term Goals - 10/25/14 1818    PT SHORT TERM GOAL #1   Title Pt will perform home exercises with mod I using paper handout to indicate safe daily compliance with HEP. Target date: 11/18/14   Baseline Initial HEP established 7/25.   Status On-going   PT SHORT TERM GOAL #2   Title Pt will negotiate four small (4.5") steps with no rails and mod I using LRAD to enable pt to safely enter/exit home.Target date: 11/18/14   Status On-going   PT SHORT TERM GOAL #3   Title Pt will negotiate standard curb step without rail with mod I using LRAD to enable pt to enter primary entrance of home, to indicate safety with community mobility.Target date: 11/18/14   Status On-going   PT SHORT TERM GOAL #4   Title Pt will verbalize understanding of fall prevention strategies to decrease fall risk within home. Target date: 11/18/14   Baseline Discussed and provided handout on 7/25.   Status On-going           PT Long Term Goals - 10/25/14 1819    PT LONG TERM GOAL #1   Title Pt will increase FGA score from 11/30 to 19/30 to indicate significant improvement in dynamic gait stability. Target date: 12/16/14   Status On-going   PT LONG TERM GOAL #2   Title Pt will independently ambulate >200' over level, indoor surfaces with effective obstacle negotiation, no overt LOB to indicate safety with household mobility. Target date: 12/16/14   Status On-going   PT LONG TERM GOAL #3   Title Pt will ambulate >500' over unlevel, outdoor surfaces (grass and asphalt) with mod I using LRAD with no overt LOB to progress toward sfety with community mobility. Target date: 12/16/14   Status On-going   PT LONG TERM GOAL #4   Title Pt will  improve Activities-specific Balance Confidence (ABC) scale score by 10 points from baseline to indicate improved balance confidence. Target date: 12/16/14   Baseline Baseline score: 44.4%   Status On-going           Plan - 10/28/14 [id number]    Clinical Impression Statement Pt required cues to perform HEP from last session correctly as she has not been doing them at home. Reinforced need for AD with gait at this time for safety and decreased fall risk. Pt reports her son currently has her cane at the beach and she is not sure when he is getting back. She does have a rolling walker at home that the hospital issued her. Advised her to use  that until she can get her cane back for safety. Pt verbalized understanding. Making steady progress toward goals.   Pt will benefit from skilled therapeutic intervention in order to improve on the following deficits Abnormal gait;Decreased balance;Decreased strength;Postural dysfunction;Pain;Decreased coordination;Decreased knowledge of use of DME   Rehab Potential Good   PT Frequency 2x / week   PT Duration 8 weeks   PT Treatment/Interventions ADLs/Self Care Home Management;Therapeutic activities;Therapeutic exercise;Neuromuscular re-education;Balance training;Cognitive remediation;Patient/family education;Orthotic Fit/Training;Gait training;Stair training;DME Instruction;Manual techniques;Vestibular;Functional mobility training   PT Next Visit Plan Continue to work with cane with gait, ramps and curbs;balance training and lower extremity strengthening as well.   Consulted and Agree with Plan of Care Patient      Problem List Patient Active Problem List   Diagnosis Date Noted  . Acute blood loss anemia 03/12/2013  . CAD (coronary artery disease)   . Benign neoplasm of colon 02/23/2013  . Nonspecific abnormal finding in stool contents 02/23/2013  . Near syncope 02/21/2013  . Anemia 02/21/2013  . Acute on chronic renal failure 02/21/2013  . Chronic systolic  congestive heart failure 02/21/2013  . GI bleed 02/21/2013  . Chronic anticoagulation 02/21/2013  . Coronary artery disease with history of myocardial infarction without history of CABG 02/21/2013  . Resting chest pain 10/27/2012  . Hypothyroidism 10/15/2012  . Atrial fibrillation 10/15/2012  . CKD (chronic kidney disease), stage III 10/15/2012  . UTI (urinary tract infection) 10/15/2012  . Dilated cardiomyopathy   . Aortic insufficiency   . PAC (premature atrial contraction)   . Chest pain 04/02/2010    Willow Ora 10/28/2014, 9:31 AM  Willow Ora, PTA, [facility] [address] [place], [place] [address] [phone] 10/28/2014, 12:58 PM

## 2014-11-01 ENCOUNTER — Ambulatory Visit: Payer: Medicare Other | Attending: Internal Medicine | Admitting: Physical Therapy

## 2014-11-01 DIAGNOSIS — R531 Weakness: Secondary | ICD-10-CM | POA: Insufficient documentation

## 2014-11-01 DIAGNOSIS — R2681 Unsteadiness on feet: Secondary | ICD-10-CM | POA: Diagnosis not present

## 2014-11-01 DIAGNOSIS — R269 Unspecified abnormalities of gait and mobility: Secondary | ICD-10-CM | POA: Diagnosis not present

## 2014-11-01 NOTE — Therapy (Signed)
[facility] [address] [place], [place], [address] Phone: [phone]   Fax:  [phone]  Physical Therapy Treatment  Patient Details  Name: Julie Lara MRN: [id number] Date of Birth: 08/16/1920 Referring Provider:  Burnard Bunting, MD  Encounter Date: 11/01/2014      PT End of Session - 11/01/14 1551    Visit Number 4   Number of Visits 17   Date for PT Re-Evaluation 12/20/14   Authorization Type Medicare - G Codes every 10 visits   PT Start Time 1446   PT Stop Time 1536   PT Time Calculation (min) 50 min   Equipment Utilized During Treatment Gait belt   Activity Tolerance Patient tolerated treatment well   Behavior During Therapy [facility] for tasks assessed/performed      Past Medical History  Diagnosis Date  . 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 History of thyroidectomy   . Dilated cardiomyopathy   . Aortic insufficiency     mild to moderate  . PAC (premature atrial contraction)     Chronic  . Hypothyroidism   . Idiopathic cardiomyopathy 1999    EF 40% by Echo on 10/13/12  . Goiter   . NSTEMI (non-ST elevated myocardial infarction) July 2014    95% prox RCA s/p BMS  . Atrial fibrillation   . CAD (coronary artery disease)   . Acute blood loss anemia     November 2014    Past Surgical History  Procedure Laterality Date  . Cardiac catheterization    . Thyroidectomy  2008  . Total vaginal hysterectomy      Complete  . Coronary angioplasty with stent placement      BMS-95% prox RCA  . Biopsy of sacral lesion  2010  . Esophagogastroduodenoscopy N/A 02/22/2013    Procedure: ESOPHAGOGASTRODUODENOSCOPY (EGD);  Surgeon: Milus Banister, MD;  Location: [facility];  Service: Endoscopy;  Laterality: N/A;  . Colonoscopy N/A 02/23/2013    Procedure: COLONOSCOPY;  Surgeon: Ladene Artist, MD;  Location: [facility] ENDOSCOPY;  Service: Endoscopy;  Laterality: N/A;  . Left heart  catheterization with coronary angiogram N/A 10/14/2012    Procedure: LEFT HEART CATHETERIZATION WITH CORONARY ANGIOGRAM;  Surgeon: Wellington Hampshire, MD;  Location: [facility] CATH LAB;  Service: Cardiovascular;  Laterality: N/A;  . Percutaneous coronary stent intervention (pci-s) Right 10/14/2012    Procedure: PERCUTANEOUS CORONARY STENT INTERVENTION (PCI-S);  Surgeon: Wellington Hampshire, MD;  Location: [facility] CATH LAB;  Service: Cardiovascular;  Laterality: Right;    There were no vitals filed for this visit.  Visit Diagnosis:  Abnormality of gait  Weakness generalized  Unsteadiness      Subjective Assessment - 11/01/14 1449    Subjective Pt reports no falls. Pt states, "My son's got a walking cane that I can use, but he's got to find it first." Pt has been performing home exercises "some," per pt. Pt also stated, " I have balance problems. You've probably never seen a patient like me. I'm a tough case."   Pertinent History abnormal brain MRI (Per MD note, cervical spine lesions are "likely malignant" but no sypmtoms),  A-fib, degenerative disc disease, CHF, cardiomyopathy, anemia, HTN   Limitations House hold activities;Walking   Patient Stated Goals "Work on my right leg to make it stronger and not walk around like I'm drunk."   Currently in Pain? No/denies  [facility] Adult PT Treatment/Exercise - 11/01/14 0001    Transfers   Transfers Sit to Stand;Stand to Sit   Sit to Stand 6: Modified independent (Device/Increase time)   Stand to Sit 6: Modified independent (Device/Increase time)   Ambulation/Gait   Ambulation/Gait Yes   Ambulation/Gait Assistance 5: Supervision;4: Min guard;4: Min assist   Ambulation/Gait Assistance Details cueing for increased   Ambulation Distance (Feet) 500 Feet  150' outdoors, 350' indoors   Assistive device None   Gait Pattern Step-through pattern;Narrow base of support;Trunk flexed;Poor foot clearance - right;Decreased trunk  rotation;Trendelenburg;Scissoring;Decreased dorsiflexion - left;Decreased hip/knee flexion - right;Decreased hip/knee flexion - left;Decreased stride length  R Trendelenburg, BLE scissoring (R>L)   Ambulation Surface Level;Indoor;Unlevel;Outdoor;Paved   Stairs Yes   Stairs Assistance 4: Min assist;5: Supervision  Min A without rails; supervision with 1 rail   Stairs Assistance Details (indicate cue type and reason) Despite cueing for sequencing (leading with LLE to ascend, with RLE to descend), pt not receptive to cueing, stating, "This is how I do it."   Stair Management Technique One rail Right   Number of Stairs 12  initial trial with R rail; subsequent trials without rails   Height of Stairs 6   Curb 4: Min assist   Curb Details (indicate cue type and reason) cueing for technique, sequencing with inconsistent within-session carryover   Gait Comments Assistive device not utilized during gait training in an attempt to demonstrate how unstable pt's gait is when not using AD; however, pt with poor awareness. After significant LOB, pt frequently stated, "See? I always catch my balance."             Balance Exercises - 11/01/14 1546    OTAGO PROGRAM   Head Movements 5 reps;Standing   Neck Movements Standing;5 reps   Back Extension Standing;5 reps   Trunk Movements Standing;5 reps   Ankle Movements Sitting;10 reps   Knee Extensor 10 reps   Knee Flexor 10 reps   Hip ABductor 10 reps           PT Education - 11/01/14 1547    Education provided Yes   Education Details HEP: removed all of current home exercises and initiated [place] due to pt noncompliance. Reiterated strong recommendations for the following: use of assistive device at all times; use of RW until pt able to get cane from son; walking in grass only with hands-on assistance from family member. Engaged in interactive discussion of reason for noncompliance with HEP. Explained importance of active participation in PT to  maximize functional gains.   Person(s) Educated Patient   Methods Explanation;Demonstration;Verbal cues;Handout   Comprehension Verbalized understanding;Returned demonstration          PT Short Term Goals - 11/01/14 1457    PT SHORT TERM GOAL #1   Title Pt will perform home exercises with mod I using paper handout to indicate safe daily compliance with HEP. Target date: 11/18/14   Baseline Initial HEP established 7/25.   Status On-going   PT SHORT TERM GOAL #2   Title Pt will negotiate four small (4.5") steps with no rails and mod I using LRAD to enable pt to safely enter/exit home.Target date: 11/18/14   Status On-going   PT SHORT TERM GOAL #3   Title Pt will negotiate standard curb step without rail with mod I using LRAD to enable pt to enter primary entrance of home, to indicate safety with community mobility.Target date: 11/18/14   Status On-going   PT SHORT TERM  GOAL #4   Title Pt will verbalize understanding of fall prevention strategies to decrease fall risk within home. Target date: 11/18/14   Baseline Achieved 8/1.   Status Achieved           PT Long Term Goals - 10/25/14 1819    PT LONG TERM GOAL #1   Title Pt will increase FGA score from 11/30 to 19/30 to indicate significant improvement in dynamic gait stability. Target date: 12/16/14   Status On-going   PT LONG TERM GOAL #2   Title Pt will independently ambulate >200' over level, indoor surfaces with effective obstacle negotiation, no overt LOB to indicate safety with household mobility. Target date: 12/16/14   Status On-going   PT LONG TERM GOAL #3   Title Pt will ambulate >500' over unlevel, outdoor surfaces (grass and asphalt) with mod I using LRAD with no overt LOB to progress toward sfety with community mobility. Target date: 12/16/14   Status On-going   PT LONG TERM GOAL #4   Title Pt will improve Activities-specific Balance Confidence (ABC) scale score by 10 points from baseline to indicate improved balance  confidence. Target date: 12/16/14   Baseline Baseline score: 44.4%   Status On-going               Plan - 11/01/14 1551    Clinical Impression Statement Gait training performed without assistive device in attempt to demonstrate gait instability to patient; however, pt exhibited poor awareness, stating, "See? I always catch my balance," after each significant LOB sustained. Engaged in interactive discussion with pt concerning importance of HEP compliance and active participation in PT to maximize functional gains. Modified HEP to [place], which appeared to be more meaningful to patient. Asked pt to bring a family member to next session; howevet, pt adamantly declined. Continue per POC.    Pt will benefit from skilled therapeutic intervention in order to improve on the following deficits Abnormal gait;Decreased balance;Decreased strength;Postural dysfunction;Pain;Decreased coordination;Decreased knowledge of use of DME   Rehab Potential Fair   PT Frequency 2x / week   PT Duration 8 weeks   PT Treatment/Interventions ADLs/Self Care Home Management;Therapeutic activities;Therapeutic exercise;Neuromuscular re-education;Balance training;Cognitive remediation;Patient/family education;Orthotic Fit/Training;Gait training;Stair training;DME Instruction;Manual techniques;Vestibular;Functional mobility training   PT Next Visit [place]. Gait training with SPC, including community obstacles.   Consulted and Agree with Plan of Care Patient        Problem List Patient Active Problem List   Diagnosis Date Noted  . Acute blood loss anemia 03/12/2013  . CAD (coronary artery disease)   . Benign neoplasm of colon 02/23/2013  . Nonspecific abnormal finding in stool contents 02/23/2013  . Near syncope 02/21/2013  . Anemia 02/21/2013  . Acute on chronic renal failure 02/21/2013  . Chronic systolic congestive heart failure 02/21/2013  . GI bleed 02/21/2013  . Chronic anticoagulation 02/21/2013   . Coronary artery disease with history of myocardial infarction without history of CABG 02/21/2013  . Resting chest pain 10/27/2012  . Hypothyroidism 10/15/2012  . Atrial fibrillation 10/15/2012  . CKD (chronic kidney disease), stage III 10/15/2012  . UTI (urinary tract infection) 10/15/2012  . Dilated cardiomyopathy   . Aortic insufficiency   . PAC (premature atrial contraction)   . Chest pain 04/02/2010   Billie Ruddy, PT, DPT [facility] [address] [place], [place], [address] Phone: [phone]   Fax:  [phone] 11/01/2014, 3:58 PM

## 2014-11-03 ENCOUNTER — Ambulatory Visit: Payer: Medicare Other | Admitting: Physical Therapy

## 2014-11-03 ENCOUNTER — Telehealth: Payer: Self-pay | Admitting: Physical Therapy

## 2014-11-03 DIAGNOSIS — R269 Unspecified abnormalities of gait and mobility: Secondary | ICD-10-CM

## 2014-11-03 DIAGNOSIS — R2681 Unsteadiness on feet: Secondary | ICD-10-CM | POA: Diagnosis not present

## 2014-11-03 DIAGNOSIS — R531 Weakness: Secondary | ICD-10-CM | POA: Diagnosis not present

## 2014-11-03 NOTE — Therapy (Signed)
[facility] [address] [place], [place], [address] Phone: [phone]   Fax:  [phone]  Physical Therapy Treatment  Patient Details  Name: Julie Lara MRN: [id number] Date of Birth: [birth year] Referring Provider:  Burnard Bunting, MD  Encounter Date: 11/03/2014      PT End of Session - 11/03/14 1117    Visit Number 5   Number of Visits 17   Date for PT Re-Evaluation 12/20/14   Authorization Type Medicare - G Codes every 10 visits   PT Start Time 1017   PT Stop Time 1102   PT Time Calculation (min) 45 min   Equipment Utilized During Treatment Gait belt   Activity Tolerance Patient tolerated treatment well   Behavior During Therapy [facility] for tasks assessed/performed      Past Medical History  Diagnosis Date  . 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 History of thyroidectomy   . Dilated cardiomyopathy   . Aortic insufficiency     mild to moderate  . PAC (premature atrial contraction)     Chronic  . Hypothyroidism   . Idiopathic cardiomyopathy 1999    EF 40% by Echo on 10/13/12  . Goiter   . NSTEMI (non-ST elevated myocardial infarction) July 2014    95% prox RCA s/p BMS  . Atrial fibrillation   . CAD (coronary artery disease)   . Acute blood loss anemia     November 2014    Past Surgical History  Procedure Laterality Date  . Cardiac catheterization    . Thyroidectomy  2008  . Total vaginal hysterectomy      Complete  . Coronary angioplasty with stent placement      BMS-95% prox RCA  . Biopsy of sacral lesion  2010  . Esophagogastroduodenoscopy N/A 02/22/2013    Procedure: ESOPHAGOGASTRODUODENOSCOPY (EGD);  Surgeon: Milus Banister, MD;  Location: [facility];  Service: Endoscopy;  Laterality: N/A;  . Colonoscopy N/A 02/23/2013    Procedure: COLONOSCOPY;  Surgeon: Ladene Artist, MD;  Location: [facility] ENDOSCOPY;  Service: Endoscopy;  Laterality: N/A;  . Left heart  catheterization with coronary angiogram N/A 10/14/2012    Procedure: LEFT HEART CATHETERIZATION WITH CORONARY ANGIOGRAM;  Surgeon: Wellington Hampshire, MD;  Location: [facility] CATH LAB;  Service: Cardiovascular;  Laterality: N/A;  . Percutaneous coronary stent intervention (pci-s) Right 10/14/2012    Procedure: PERCUTANEOUS CORONARY STENT INTERVENTION (PCI-S);  Surgeon: Wellington Hampshire, MD;  Location: [facility] CATH LAB;  Service: Cardiovascular;  Laterality: Right;    There were no vitals filed for this visit.  Visit Diagnosis:  Abnormality of gait  Weakness generalized  Unsteadiness      Subjective Assessment - 11/03/14 1021    Subjective Denies falls. Pt reports son is unable to find cane. Ater discussion, pt agreeable to getting personal cane, if referring MD is in agreement. Pt states, "I did those exercises this morning."   Pertinent History abnormal brain MRI (Per MD note, cervical spine lesions are "likely malignant" but no sypmtoms),  A-fib, degenerative disc disease, CHF, cardiomyopathy, anemia, HTN   Limitations House hold activities;Walking   Patient Stated Goals "Work on my right leg to make it stronger and not walk around like I'm drunk."   Currently in Pain? No/denies                         [facility] Adult PT Treatment/Exercise - 11/03/14 0001  Transfers   Transfers Sit to Stand;Stand to Sit   Sit to Stand 6: Modified independent (Device/Increase time)   Stand to Sit 6: Modified independent (Device/Increase time)   Ambulation/Gait   Ambulation/Gait Yes   Ambulation/Gait Assistance 5: Supervision;4: Min guard;4: Min assist   Ambulation/Gait Assistance Details verbal/tactile cueing for sequencing of gait with SPC (2-point gait pattern). Initially focused on holding SPC in L hand   Ambulation Distance (Feet) 450 Feet   Assistive device Straight cane   Gait Pattern Step-through pattern;Narrow base of support;Trunk flexed;Trendelenburg;Scissoring;Decreased dorsiflexion -  left;Decreased hip/knee flexion - right;Decreased hip/knee flexion - left;Decreased stride length;Poor foot clearance - right  BLE scissoring (R>L); R Trendelenberg less prominent w/ SPC   Ambulation Surface Level;Indoor   Stairs No   Stairs Assistance --   Stair Management Technique --   Number of Stairs --   Height of Stairs --   Ramp 4: Min assist;5: Supervision  min guard   Ramp Details (indicate cue type and reason) using SPC   Curb 4: Min assist   Curb Details (indicate cue type and reason) Pt with increased anxiety negotiating standard curb using SPC. Able to ascend/descend curb with close supervision to min guard without SPC, with min guard to Min A, 50% cueing, and max encouragement with SPC   Gait Comments --             Balance Exercises - 11/03/14 1113    OTAGO PROGRAM   Ankle Plantorflexors 20 reps, support   Ankle Dorsiflexors 20 reps, support   Knee Bends 10 reps, no support  standing in front of chair for safety   Backwards Walking Support   Walking and Turning Around Assistive device  using [facility]   Sideways Walking No assistive device  countertop in front for safety   Tandem Stance 10 seconds, support           PT Education - 11/03/14 1115    Education provided Yes   Education Details HEP: continued instructing pt in [place]. Reiterated recommendation for RW for all mobility; however, pt not agreeable. Therefore, recommended use of single point cane [facility]). Pt agreeable. Safe/proper use of SPC for walking, curb step negotiation.     Person(s) Educated Associate Professor)  daughter; pt verbally requested that daughter receive education today   Methods Explanation;Demonstration   Comprehension Verbalized understanding;Returned demonstration;Need further instruction          PT Short Term Goals - 11/01/14 1457    PT SHORT TERM GOAL #1   Title Pt will perform home exercises with mod I using paper handout to indicate safe daily compliance with HEP. Target  date: 11/18/14   Baseline Initial HEP established 7/25.   Status On-going   PT SHORT TERM GOAL #2   Title Pt will negotiate four small (4.5") steps with no rails and mod I using LRAD to enable pt to safely enter/exit home.Target date: 11/18/14   Status On-going   PT SHORT TERM GOAL #3   Title Pt will negotiate standard curb step without rail with mod I using LRAD to enable pt to enter primary entrance of home, to indicate safety with community mobility.Target date: 11/18/14   Status On-going   PT SHORT TERM GOAL #4   Title Pt will verbalize understanding of fall prevention strategies to decrease fall risk within home. Target date: 11/18/14   Baseline Achieved 8/1.   Status Achieved           PT Long Term Goals - 10/25/14  1819    PT LONG TERM GOAL #1   Title Pt will increase FGA score from 11/30 to 19/30 to indicate significant improvement in dynamic gait stability. Target date: 12/16/14   Status On-going   PT LONG TERM GOAL #2   Title Pt will independently ambulate >200' over level, indoor surfaces with effective obstacle negotiation, no overt LOB to indicate safety with household mobility. Target date: 12/16/14   Status On-going   PT LONG TERM GOAL #3   Title Pt will ambulate >500' over unlevel, outdoor surfaces (grass and asphalt) with mod I using LRAD with no overt LOB to progress toward sfety with community mobility. Target date: 12/16/14   Status On-going   PT LONG TERM GOAL #4   Title Pt will improve Activities-specific Balance Confidence (ABC) scale score by 10 points from baseline to indicate improved balance confidence. Target date: 12/16/14   Baseline Baseline score: 44.4%   Status On-going               Plan - 11/03/14 1118    Clinical Impression Statement Pt much more receptive to PT recommendations during this session as compared to previous sessions. Pt requested that daughter be present for session and receive all education; therefore, provided education to  daughter, including PT recommendation for use of assistive device with all functional mobility. Also noted that pt much more motivated to perform [facility] as compared with previous HEP. Continue per POC.   Pt will benefit from skilled therapeutic intervention in order to improve on the following deficits Abnormal gait;Decreased balance;Decreased strength;Postural dysfunction;Pain;Decreased coordination;Decreased knowledge of use of DME   Rehab Potential Good   PT Frequency 2x / week   PT Duration 8 weeks   PT Treatment/Interventions ADLs/Self Care Home Management;Therapeutic activities;Therapeutic exercise;Neuromuscular re-education;Balance training;Cognitive remediation;Patient/family education;Orthotic Fit/Training;Gait training;Stair training;DME Instruction;Manual techniques;Vestibular;Functional mobility training   PT Next Visit [place]. Gait training with SPC, including community obstacles. If MD order received and pt in agreement, obtain SPC from facility supply closet.   PT Home Exercise Plan [facility].   Recommended Other Services Requested MD order for [facility] via telephone encounter. Follow up on this.   Consulted and Agree with Plan of Care Patient;Family member/caregiver   Family Member Consulted daughter        Problem List Patient Active Problem List   Diagnosis Date Noted  . Acute blood loss anemia 03/12/2013  . CAD (coronary artery disease)   . Benign neoplasm of colon 02/23/2013  . Nonspecific abnormal finding in stool contents 02/23/2013  . Near syncope 02/21/2013  . Anemia 02/21/2013  . Acute on chronic renal failure 02/21/2013  . Chronic systolic congestive heart failure 02/21/2013  . GI bleed 02/21/2013  . Chronic anticoagulation 02/21/2013  . Coronary artery disease with history of myocardial infarction without history of CABG 02/21/2013  . Resting chest pain 10/27/2012  . Hypothyroidism 10/15/2012  . Atrial fibrillation 10/15/2012  . CKD (chronic kidney  disease), stage III 10/15/2012  . UTI (urinary tract infection) 10/15/2012  . Dilated cardiomyopathy   . Aortic insufficiency   . PAC (premature atrial contraction)   . Chest pain 04/02/2010    Billie Ruddy, PT, DPT [facility] [address] [place], [place], [address] Phone: [phone]   Fax:  [phone] 11/03/2014, 11:23 AM

## 2014-11-03 NOTE — Telephone Encounter (Signed)
Dr. Reynaldo Minium,  I've been seeing Julie Lara in outpatient physical therapy to address gait instability.The patient would greatly benefit from using a single point cane to improve gait stability and decrease fall risk. If you agree, please place an order for a cane.  Thanks so much, Billie Ruddy, PT, DPT [facility] [address] [place], [place], [address] Phone: [phone]   Fax:  [phone] 11/03/2014, 11:27 AM

## 2014-11-08 ENCOUNTER — Ambulatory Visit: Payer: Medicare Other | Admitting: Physical Therapy

## 2014-11-08 DIAGNOSIS — R531 Weakness: Secondary | ICD-10-CM

## 2014-11-08 DIAGNOSIS — R269 Unspecified abnormalities of gait and mobility: Secondary | ICD-10-CM | POA: Diagnosis not present

## 2014-11-08 DIAGNOSIS — R2681 Unsteadiness on feet: Secondary | ICD-10-CM | POA: Diagnosis not present

## 2014-11-08 NOTE — Therapy (Signed)
[facility] [address] [place], [place], [address] Phone: [phone]   Fax:  [phone]  Physical Therapy Treatment  Patient Details  Name: Julie Lara MRN: [id number] Date of Birth: Jul 28, 1920 Referring Provider:  Burnard Bunting, MD  Encounter Date: 11/08/2014      PT End of Session - 11/08/14 1310    Visit Number 6   Number of Visits 17   Date for PT Re-Evaluation 12/20/14   Authorization Type Medicare - G Codes every 10 visits   PT Start Time 1016   PT Stop Time 1102   PT Time Calculation (min) 46 min   Equipment Utilized During Treatment Gait belt   Activity Tolerance Patient tolerated treatment well   Behavior During Therapy [facility] for tasks assessed/performed      Past Medical History  Diagnosis Date  . 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 History of thyroidectomy   . Dilated cardiomyopathy   . Aortic insufficiency     mild to moderate  . PAC (premature atrial contraction)     Chronic  . Hypothyroidism   . Idiopathic cardiomyopathy 1999    EF 40% by Echo on 10/13/12  . Goiter   . NSTEMI (non-ST elevated myocardial infarction) July 2014    95% prox RCA s/p BMS  . Atrial fibrillation   . CAD (coronary artery disease)   . Acute blood loss anemia     November 2014    Past Surgical History  Procedure Laterality Date  . Cardiac catheterization    . Thyroidectomy  2008  . Total vaginal hysterectomy      Complete  . Coronary angioplasty with stent placement      BMS-95% prox RCA  . Biopsy of sacral lesion  2010  . Esophagogastroduodenoscopy N/A 02/22/2013    Procedure: ESOPHAGOGASTRODUODENOSCOPY (EGD);  Surgeon: Milus Banister, MD;  Location: [facility];  Service: Endoscopy;  Laterality: N/A;  . Colonoscopy N/A 02/23/2013    Procedure: COLONOSCOPY;  Surgeon: Ladene Artist, MD;  Location: [facility] ENDOSCOPY;  Service: Endoscopy;  Laterality: N/A;  . Left heart  catheterization with coronary angiogram N/A 10/14/2012    Procedure: LEFT HEART CATHETERIZATION WITH CORONARY ANGIOGRAM;  Surgeon: Wellington Hampshire, MD;  Location: [place] CATH LAB;  Service: Cardiovascular;  Laterality: N/A;  . Percutaneous coronary stent intervention (pci-s) Right 10/14/2012    Procedure: PERCUTANEOUS CORONARY STENT INTERVENTION (PCI-S);  Surgeon: Wellington Hampshire, MD;  Location: [facility] CATH LAB;  Service: Cardiovascular;  Laterality: Right;    There were no vitals filed for this visit.  Visit Diagnosis:  Abnormality of gait  Weakness generalized  Unsteadiness      Subjective Assessment - 11/08/14 1021    Subjective "I've been doing those exercises you gave me, but I left them on my table when I left home." Pt denies falls.   Pertinent History abnormal brain MRI (Per MD note, cervical spine lesions are "likely malignant" but no sypmtoms),  A-fib, degenerative disc disease, CHF, cardiomyopathy, anemia, HTN   Limitations House hold activities;Walking   Patient Stated Goals "Work on my right leg to make it stronger and not walk around like I'm drunk."   Currently in Pain? No/denies                         [facility] Adult PT Treatment/Exercise - 11/08/14 0001    Transfers   Transfers Sit to Stand;Stand to Sit  Sit to Stand 5: Supervision   Sit to Stand Details (indicate cue type and reason) without UE support   Stand to Sit 5: Supervision   Ambulation/Gait   Ambulation/Gait Yes   Ambulation/Gait Assistance 5: Supervision;4: Min guard;4: Min assist   Ambulation Distance (Feet) 250 Feet   Assistive device Straight cane   Gait Pattern Step-through pattern;Narrow base of support;Trunk flexed;Trendelenburg;Scissoring;Decreased dorsiflexion - left;Decreased hip/knee flexion - right;Decreased hip/knee flexion - left;Decreased stride length;Poor foot clearance - right  BLE scissoring (R>L); R Trendelenberg   Ambulation Surface Level;Indoor   Stairs No   Ramp 5:  Supervision  with SPC   Curb 4: Min assist   Curb Details (indicate cue type and reason) with SPC, increased time, and max encouragement due to pt anxietynegotiating curb with [facility]             Balance Exercises - 11/08/14 1035    OTAGO PROGRAM   Tandem Walk Support  x2 trials   One Leg Stand 10 seconds, support   Heel Walking Support  x4 trials   Toe Walk Support   Heel Toe Walking Backward No support  2 x10 reps with single UE support   Sit to Stand 10 reps, no support           PT Education - 11/08/14 1252    Education provided Yes   Education Details Completed instruction of [place].   Person(s) Educated Patient   Methods Explanation   Comprehension Verbalized understanding          PT Short Term Goals - 11/01/14 1457    PT SHORT TERM GOAL #1   Title Pt will perform home exercises with mod I using paper handout to indicate safe daily compliance with HEP. Target date: 11/18/14   Baseline Initial HEP established 7/25.   Status On-going   PT SHORT TERM GOAL #2   Title Pt will negotiate four small (4.5") steps with no rails and mod I using LRAD to enable pt to safely enter/exit home.Target date: 11/18/14   Status On-going   PT SHORT TERM GOAL #3   Title Pt will negotiate standard curb step without rail with mod I using LRAD to enable pt to enter primary entrance of home, to indicate safety with community mobility.Target date: 11/18/14   Status On-going   PT SHORT TERM GOAL #4   Title Pt will verbalize understanding of fall prevention strategies to decrease fall risk within home. Target date: 11/18/14   Baseline Achieved 8/1.   Status Achieved           PT Long Term Goals - 10/25/14 1819    PT LONG TERM GOAL #1   Title Pt will increase FGA score from 11/30 to 19/30 to indicate significant improvement in dynamic gait stability. Target date: 12/16/14   Status On-going   PT LONG TERM GOAL #2   Title Pt will independently ambulate >200' over level, indoor surfaces  with effective obstacle negotiation, no overt LOB to indicate safety with household mobility. Target date: 12/16/14   Status On-going   PT LONG TERM GOAL #3   Title Pt will ambulate >500' over unlevel, outdoor surfaces (grass and asphalt) with mod I using LRAD with no overt LOB to progress toward sfety with community mobility. Target date: 12/16/14   Status On-going   PT LONG TERM GOAL #4   Title Pt will improve Activities-specific Balance Confidence (ABC) scale score by 10 points from baseline to indicate improved balance confidence. Target date: 12/16/14  Baseline Baseline score: 44.4%   Status On-going               Plan - 11/08/14 1311    Clinical Impression Statement Finished instruction of [employer]. Pt continuing to demonstrate increased anxiety utilizing [facility] for curb/stair negotiation. However, pt much more open to utilizing [facility] for mobility. Plan to provide cane from facility supply room as soon as MD order is received for [facility]. Continue per POC.   Pt will benefit from skilled therapeutic intervention in order to improve on the following deficits Abnormal gait;Decreased balance;Decreased strength;Postural dysfunction;Pain;Decreased coordination;Decreased knowledge of use of DME   Rehab Potential Good   PT Frequency 2x / week   PT Duration 8 weeks   PT Treatment/Interventions ADLs/Self Care Home Management;Therapeutic activities;Therapeutic exercise;Neuromuscular re-education;Balance training;Cognitive remediation;Patient/family education;Orthotic Fit/Training;Gait training;Stair training;DME Instruction;Manual techniques;Vestibular;Functional mobility training   PT Next Visit Plan Gait training with SPC, including community obstacles. If MD order received and pt in agreement, obtain SPC from facility supply closet.   PT Home Exercise Plan [facility].   Recommended Other Services Requesting MD order for [facility] via fax. Follow up on this.   Consulted and Agree with Plan of Care Patient         Problem List Patient Active Problem List   Diagnosis Date Noted  . Acute blood loss anemia 03/12/2013  . CAD (coronary artery disease)   . Benign neoplasm of colon 02/23/2013  . Nonspecific abnormal finding in stool contents 02/23/2013  . Near syncope 02/21/2013  . Anemia 02/21/2013  . Acute on chronic renal failure 02/21/2013  . Chronic systolic congestive heart failure 02/21/2013  . GI bleed 02/21/2013  . Chronic anticoagulation 02/21/2013  . Coronary artery disease with history of myocardial infarction without history of CABG 02/21/2013  . Resting chest pain 10/27/2012  . Hypothyroidism 10/15/2012  . Atrial fibrillation 10/15/2012  . CKD (chronic kidney disease), stage III 10/15/2012  . UTI (urinary tract infection) 10/15/2012  . Dilated cardiomyopathy   . Aortic insufficiency   . PAC (premature atrial contraction)   . Chest pain 04/02/2010    Billie Ruddy, PT, DPT [facility] [address] [place], [place], [address] Phone: [phone]   Fax:  [phone] 11/08/2014, 1:14 PM

## 2014-11-10 ENCOUNTER — Ambulatory Visit: Payer: Medicare Other | Admitting: Physical Therapy

## 2014-11-10 VITALS — BP 169/96 | HR 54

## 2014-11-10 DIAGNOSIS — R2681 Unsteadiness on feet: Secondary | ICD-10-CM

## 2014-11-10 DIAGNOSIS — R531 Weakness: Secondary | ICD-10-CM

## 2014-11-10 DIAGNOSIS — R269 Unspecified abnormalities of gait and mobility: Secondary | ICD-10-CM | POA: Diagnosis not present

## 2014-11-10 NOTE — Therapy (Signed)
[facility] [address] [place], [place], [address] Phone: [phone]   Fax:  [phone]  Physical Therapy Treatment  Patient Details  Name: Julie Lara MRN: [id number] Date of Birth: [birth year] Referring Provider:  Burnard Bunting, MD  Encounter Date: 11/10/2014      PT End of Session - 11/10/14 1238    Visit Number 7   Number of Visits 17   Date for PT Re-Evaluation 12/20/14   Authorization Type Medicare - G Codes every 10 visits   PT Start Time [id number]   PT Stop Time 1226   PT Time Calculation (min) 42 min   Equipment Utilized During Treatment Gait belt   Activity Tolerance Patient tolerated treatment well   Behavior During Therapy [facility] for tasks assessed/performed      Past Medical History  Diagnosis Date  . 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 History of thyroidectomy   . Dilated cardiomyopathy   . Aortic insufficiency     mild to moderate  . PAC (premature atrial contraction)     Chronic  . Hypothyroidism   . Idiopathic cardiomyopathy 1999    EF 40% by Echo on 10/13/12  . Goiter   . NSTEMI (non-ST elevated myocardial infarction) July 2014    95% prox RCA s/p BMS  . Atrial fibrillation   . CAD (coronary artery disease)   . Acute blood loss anemia     November 2014    Past Surgical History  Procedure Laterality Date  . Cardiac catheterization    . Thyroidectomy  2008  . Total vaginal hysterectomy      Complete  . Coronary angioplasty with stent placement      BMS-95% prox RCA  . Biopsy of sacral lesion  2010  . Esophagogastroduodenoscopy N/A 02/22/2013    Procedure: ESOPHAGOGASTRODUODENOSCOPY (EGD);  Surgeon: Milus Banister, MD;  Location: [facility];  Service: Endoscopy;  Laterality: N/A;  . Colonoscopy N/A 02/23/2013    Procedure: COLONOSCOPY;  Surgeon: Ladene Artist, MD;  Location: [facility] ENDOSCOPY;  Service: Endoscopy;  Laterality: N/A;  . Left heart  catheterization with coronary angiogram N/A 10/14/2012    Procedure: LEFT HEART CATHETERIZATION WITH CORONARY ANGIOGRAM;  Surgeon: Wellington Hampshire, MD;  Location: [facility] CATH LAB;  Service: Cardiovascular;  Laterality: N/A;  . Percutaneous coronary stent intervention (pci-s) Right 10/14/2012    Procedure: PERCUTANEOUS CORONARY STENT INTERVENTION (PCI-S);  Surgeon: Wellington Hampshire, MD;  Location: [facility] CATH LAB;  Service: Cardiovascular;  Laterality: Right;    Filed Vitals:   11/10/14 1159 11/10/14 1218  BP: 162/90 169/96  Pulse: 49 54  SpO2: 90% 97%    Visit Diagnosis:  Abnormality of gait  Unsteadiness  Weakness generalized                       [facility] Adult PT Treatment/Exercise - 11/10/14 0001    Transfers   Transfers Sit to Stand;Stand to Sit   Sit to Stand 6: Modified independent (Device/Increase time)   Stand to Sit 6: Modified independent (Device/Increase time)   Ambulation/Gait   Ambulation/Gait Yes   Ambulation/Gait Assistance 5: Supervision;3: Mod assist;4: Min guard;4: Min assist   Ambulation/Gait Assistance Details Mod A to recover from significant LOB x1 secondary to RLE scissoring when ambulating over paved, outdoor surface   Ambulation Distance (Feet) 550 Feet   Assistive device Straight cane   Gait Pattern Step-through pattern;Narrow base of support;Trunk flexed;Trendelenburg;Scissoring;Decreased dorsiflexion -  left;Decreased hip/knee flexion - right;Decreased hip/knee flexion - left;Decreased stride length;Poor foot clearance - right  RLE scissoring; R Trendelenberg   Ambulation Surface Level;Unlevel;Indoor;Outdoor;Paved   Stairs No   Ramp 5: Supervision  with SPC   Curb 4: Min assist   Curb Details (indicate cue type and reason) using SPC; required Min guard, cueing for sequencing with ascent/descent    Gait Comments Cueing focused on increasing pt awareness of narrow BOS, RLE adduction/scissoring.                PT Education - 11/10/14  1232    Education provided Yes   Education Details Instructed pt on normal BP, risks of elevated BP. With pt approval, notified daughter of elevated BP during this session.   Person(s) Educated Patient;Child(ren)   Methods Explanation   Comprehension Verbalized understanding          PT Short Term Goals - 11/01/14 1457    PT SHORT TERM GOAL #1   Title Pt will perform home exercises with mod I using paper handout to indicate safe daily compliance with HEP. Target date: 11/18/14   Baseline Initial HEP established 7/25.   Status On-going   PT SHORT TERM GOAL #2   Title Pt will negotiate four small (4.5") steps with no rails and mod I using LRAD to enable pt to safely enter/exit home.Target date: 11/18/14   Status On-going   PT SHORT TERM GOAL #3   Title Pt will negotiate standard curb step without rail with mod I using LRAD to enable pt to enter primary entrance of home, to indicate safety with community mobility.Target date: 11/18/14   Status On-going   PT SHORT TERM GOAL #4   Title Pt will verbalize understanding of fall prevention strategies to decrease fall risk within home. Target date: 11/18/14   Baseline Achieved 8/1.   Status Achieved           PT Long Term Goals - 10/25/14 1819    PT LONG TERM GOAL #1   Title Pt will increase FGA score from 11/30 to 19/30 to indicate significant improvement in dynamic gait stability. Target date: 12/16/14   Status On-going   PT LONG TERM GOAL #2   Title Pt will independently ambulate >200' over level, indoor surfaces with effective obstacle negotiation, no overt LOB to indicate safety with household mobility. Target date: 12/16/14   Status On-going   PT LONG TERM GOAL #3   Title Pt will ambulate >500' over unlevel, outdoor surfaces (grass and asphalt) with mod I using LRAD with no overt LOB to progress toward sfety with community mobility. Target date: 12/16/14   Status On-going   PT LONG TERM GOAL #4   Title Pt will improve  Activities-specific Balance Confidence (ABC) scale score by 10 points from baseline to indicate improved balance confidence. Target date: 12/16/14   Baseline Baseline score: 44.4%   Status On-going               Plan - 11/10/14 1240    Clinical Impression Statement Pt reporting feeling "lousy" today. Noted elevated BP (162/90 at rest) and SpO2 fluctuating between 90-97% during session. Limited activity and monitored vitals throughout; highest BP was 169/96 after gait. Notified pt's daughter of elevated BP (with pt approval); daughter to check to make sure pt took medication appropriately this AM. Pt departed PT clinic in no apparent distress. Continue per POC.   Pt will benefit from skilled therapeutic intervention in order to improve on the following  deficits Abnormal gait;Decreased balance;Decreased strength;Postural dysfunction;Pain;Decreased coordination;Decreased knowledge of use of DME   Rehab Potential Good   PT Frequency 2x / week   PT Duration 8 weeks   PT Treatment/Interventions ADLs/Self Care Home Management;Therapeutic activities;Therapeutic exercise;Neuromuscular re-education;Balance training;Cognitive remediation;Patient/family education;Orthotic Fit/Training;Gait training;Stair training;DME Instruction;Manual techniques;Vestibular;Functional mobility training   PT Next Visit Plan Check BP. Gait training with SPC, including community obstacles. If MD order received and pt in agreement, obtain SPC from facility supply closet.   PT Home Exercise Plan [facility].   Recommended Other Services Order for [facility] faxed to MD. Follow up on this.   Consulted and Agree with Plan of Care Patient;Family member/caregiver   Family Member Consulted daughter        Problem List Patient Active Problem List   Diagnosis Date Noted  . Acute blood loss anemia 03/12/2013  . CAD (coronary artery disease)   . Benign neoplasm of colon 02/23/2013  . Nonspecific abnormal finding in stool contents  02/23/2013  . Near syncope 02/21/2013  . Anemia 02/21/2013  . Acute on chronic renal failure 02/21/2013  . Chronic systolic congestive heart failure 02/21/2013  . GI bleed 02/21/2013  . Chronic anticoagulation 02/21/2013  . Coronary artery disease with history of myocardial infarction without history of CABG 02/21/2013  . Resting chest pain 10/27/2012  . Hypothyroidism 10/15/2012  . Atrial fibrillation 10/15/2012  . CKD (chronic kidney disease), stage III 10/15/2012  . UTI (urinary tract infection) 10/15/2012  . Dilated cardiomyopathy   . Aortic insufficiency   . PAC (premature atrial contraction)   . Chest pain 04/02/2010   Billie Ruddy, PT, DPT [facility] [address] [place], [place], [address] Phone: [phone]   Fax:  [phone] 11/10/2014, 12:48 PM

## 2014-11-15 ENCOUNTER — Ambulatory Visit: Payer: Medicare Other | Admitting: Physical Therapy

## 2014-11-15 DIAGNOSIS — R531 Weakness: Secondary | ICD-10-CM

## 2014-11-15 DIAGNOSIS — R269 Unspecified abnormalities of gait and mobility: Secondary | ICD-10-CM

## 2014-11-15 DIAGNOSIS — R2681 Unsteadiness on feet: Secondary | ICD-10-CM

## 2014-11-15 NOTE — Therapy (Signed)
[facility] [address] [place], [place], [address] Phone: [phone]   Fax:  [phone]  Physical Therapy Treatment  Patient Details  Name: Julie Lara MRN: [id number] Date of Birth: Nov 02, 1920 Referring Provider:  Burnard Bunting, MD  Encounter Date: 11/15/2014      PT End of Session - 11/15/14 1313    Visit Number 8   Number of Visits 17   Date for PT Re-Evaluation 12/20/14   Authorization Type Medicare - G Codes every 10 visits   PT Start Time 1020   PT Stop Time 1102   PT Time Calculation (min) 42 min   Equipment Utilized During Treatment Gait belt   Activity Tolerance Patient tolerated treatment well   Behavior During Therapy [facility] for tasks assessed/performed      Past Medical History  Diagnosis Date  . 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 History of thyroidectomy   . Dilated cardiomyopathy   . Aortic insufficiency     mild to moderate  . PAC (premature atrial contraction)     Chronic  . Hypothyroidism   . Idiopathic cardiomyopathy 1999    EF 40% by Echo on 10/13/12  . Goiter   . NSTEMI (non-ST elevated myocardial infarction) July 2014    95% prox RCA s/p BMS  . Atrial fibrillation   . CAD (coronary artery disease)   . Acute blood loss anemia     November 2014    Past Surgical History  Procedure Laterality Date  . Cardiac catheterization    . Thyroidectomy  2008  . Total vaginal hysterectomy      Complete  . Coronary angioplasty with stent placement      BMS-95% prox RCA  . Biopsy of sacral lesion  2010  . Esophagogastroduodenoscopy N/A 02/22/2013    Procedure: ESOPHAGOGASTRODUODENOSCOPY (EGD);  Surgeon: Milus Banister, MD;  Location: [facility];  Service: Endoscopy;  Laterality: N/A;  . Colonoscopy N/A 02/23/2013    Procedure: COLONOSCOPY;  Surgeon: Ladene Artist, MD;  Location: [facility] ENDOSCOPY;  Service: Endoscopy;  Laterality: N/A;  . Left heart  catheterization with coronary angiogram N/A 10/14/2012    Procedure: LEFT HEART CATHETERIZATION WITH CORONARY ANGIOGRAM;  Surgeon: Wellington Hampshire, MD;  Location: [facility] CATH LAB;  Service: Cardiovascular;  Laterality: N/A;  . Percutaneous coronary stent intervention (pci-s) Right 10/14/2012    Procedure: PERCUTANEOUS CORONARY STENT INTERVENTION (PCI-S);  Surgeon: Wellington Hampshire, MD;  Location: [facility] CATH LAB;  Service: Cardiovascular;  Laterality: Right;    There were no vitals filed for this visit.  Visit Diagnosis:  Abnormality of gait  Unsteadiness  Weakness generalized      Subjective Assessment - 11/15/14 1311    Subjective "I do those (home) exercises here and there during the day." Denies falls. Pt did take BP medication this AM.   Pertinent History abnormal brain MRI (Per MD note, cervical spine lesions are "likely malignant" but no sypmtoms),  A-fib, degenerative disc disease, CHF, cardiomyopathy, anemia, HTN   Patient Stated Goals "Work on my right leg to make it stronger and not walk around like I'm drunk."   Currently in Pain? No/denies                         [facility] Adult PT Treatment/Exercise - 11/15/14 0001    Transfers   Transfers Sit to Stand;Stand to Sit   Sit to Stand 6: Modified independent (Device/Increase  time)   Stand to Sit 6: Modified independent (Device/Increase time)   Ambulation/Gait   Ambulation/Gait Yes   Ambulation/Gait Assistance 5: Supervision;4: Min guard;4: Min assist   Ambulation Distance (Feet) 700 Feet  x150' outdoors   Assistive device Straight cane   Gait Pattern Step-through pattern;Narrow base of support;Trunk flexed;Trendelenburg;Decreased dorsiflexion - left;Decreased hip/knee flexion - right;Decreased hip/knee flexion - left;Decreased stride length  RLE scissoring; R Trendelenberg   Ambulation Surface Level;Unlevel;Indoor;Outdoor;Paved   Stairs Yes   Stairs Assistance 5: Supervision   Ramp 5: Supervision  with SPC   Curb  5: Supervision;6: Modified independent (Device/increase time)   Curb Details (indicate cue type and reason) min guard with SPC; mod I without [facility]             Balance Exercises - 11/15/14 1315    Balance Exercises: Standing   Standing Eyes Opened Foam/compliant surface;30 secs;Other (comment)  supervision; no LOB   Standing Eyes Closed Foam/compliant surface;30 secs;Other (comment)  close (S); posterior preference but no LOB   SLS Eyes open;Upper extremity support 1;15 secs  difficulty transitioning to fingertip support due to fear   Tandem Gait Forward;Upper extremity support;Other (comment)  10' x4 reps with single UE support, (S)           PT Education - 11/15/14 1305    Education provided Yes   Education Details Sequencing of gait with SPC.    Person(s) Educated Patient   Methods Explanation;Demonstration   Comprehension Verbalized understanding;Returned demonstration;Need further instruction          PT Short Term Goals - 11/15/14 1051    PT SHORT TERM GOAL #1   Title Pt will perform home exercises with mod I using paper handout to indicate safe daily compliance with HEP. Target date: 11/18/14   Baseline Initial HEP established 7/25.   Status On-going   PT SHORT TERM GOAL #2   Title Pt will negotiate four small (4.5") steps with no rails and mod I using LRAD to enable pt to safely enter/exit home.Target date: 11/18/14   Baseline Met 8/15 without AD, as pt demonstrates increased difficulty with SPC.   Status Achieved   PT SHORT TERM GOAL #3   Title Pt will negotiate standard curb step without rail with mod I using LRAD to enable pt to enter primary entrance of home, to indicate safety with community mobility.Target date: 11/18/14   Baseline Met 8/15 without AD, as pt demonstrates increased difficulty with SPC.   Status Achieved   PT SHORT TERM GOAL #4   Title Pt will verbalize understanding of fall prevention strategies to decrease fall risk within home. Target  date: 11/18/14   Baseline Achieved 8/1.   Status Achieved           PT Long Term Goals - 10/25/14 1819    PT LONG TERM GOAL #1   Title Pt will increase FGA score from 11/30 to 19/30 to indicate significant improvement in dynamic gait stability. Target date: 12/16/14   Status On-going   PT LONG TERM GOAL #2   Title Pt will independently ambulate >200' over level, indoor surfaces with effective obstacle negotiation, no overt LOB to indicate safety with household mobility. Target date: 12/16/14   Status On-going   PT LONG TERM GOAL #3   Title Pt will ambulate >500' over unlevel, outdoor surfaces (grass and asphalt) with mod I using LRAD with no overt LOB to progress toward sfety with community mobility. Target date: 12/16/14   Status On-going  PT LONG TERM GOAL #4   Title Pt will improve Activities-specific Balance Confidence (ABC) scale score by 10 points from baseline to indicate improved balance confidence. Target date: 12/16/14   Baseline Baseline score: 44.4%   Status On-going               Plan - 11/15/14 1318    Clinical Impression Statement Pt has met 3 of 4 LTG's. LTG 1 for HEP performance not assessed due to pt having left exercise program at home today. Provided pt with single point cane [facility]) from facility supply room today. Pt will continue to benefit from skilled outpatient PT to increase stability/independence with functional mobility, decrease fall risk. Continue per POC.   Pt will benefit from skilled therapeutic intervention in order to improve on the following deficits Abnormal gait;Decreased balance;Decreased strength;Postural dysfunction;Pain;Decreased coordination;Decreased knowledge of use of DME   Rehab Potential Good   PT Frequency 2x / week   PT Duration 8 weeks   PT Treatment/Interventions ADLs/Self Care Home Management;Therapeutic activities;Therapeutic exercise;Neuromuscular re-education;Balance training;Cognitive remediation;Patient/family  education;Orthotic Fit/Training;Gait training;Stair training;DME Instruction;Manual techniques;Vestibular;Functional mobility training   PT Next Visit Plan Check final STG [facility] performance). Assess postural stability standing on foam with head turns.    Consulted and Agree with Plan of Care Patient        Problem List Patient Active Problem List   Diagnosis Date Noted  . Acute blood loss anemia 03/12/2013  . CAD (coronary artery disease)   . Benign neoplasm of colon 02/23/2013  . Nonspecific abnormal finding in stool contents 02/23/2013  . Near syncope 02/21/2013  . Anemia 02/21/2013  . Acute on chronic renal failure 02/21/2013  . Chronic systolic congestive heart failure 02/21/2013  . GI bleed 02/21/2013  . Chronic anticoagulation 02/21/2013  . Coronary artery disease with history of myocardial infarction without history of CABG 02/21/2013  . Resting chest pain 10/27/2012  . Hypothyroidism 10/15/2012  . Atrial fibrillation 10/15/2012  . CKD (chronic kidney disease), stage III 10/15/2012  . UTI (urinary tract infection) 10/15/2012  . Dilated cardiomyopathy   . Aortic insufficiency   . PAC (premature atrial contraction)   . Chest pain 04/02/2010    Billie Ruddy, PT, DPT [facility] [address] [place], [place], [address] Phone: [phone]   Fax:  [phone] 11/15/2014, 1:23 PM

## 2014-11-17 ENCOUNTER — Ambulatory Visit: Payer: Medicare Other | Admitting: Physical Therapy

## 2014-11-17 ENCOUNTER — Encounter: Payer: Self-pay | Admitting: Physical Therapy

## 2014-11-17 VITALS — BP 113/72 | HR 78

## 2014-11-17 DIAGNOSIS — R269 Unspecified abnormalities of gait and mobility: Secondary | ICD-10-CM | POA: Diagnosis not present

## 2014-11-17 DIAGNOSIS — I10 Essential (primary) hypertension: Secondary | ICD-10-CM | POA: Diagnosis not present

## 2014-11-17 DIAGNOSIS — R531 Weakness: Secondary | ICD-10-CM

## 2014-11-17 DIAGNOSIS — E559 Vitamin D deficiency, unspecified: Secondary | ICD-10-CM | POA: Diagnosis not present

## 2014-11-17 DIAGNOSIS — R2681 Unsteadiness on feet: Secondary | ICD-10-CM

## 2014-11-17 DIAGNOSIS — E038 Other specified hypothyroidism: Secondary | ICD-10-CM | POA: Diagnosis not present

## 2014-11-18 NOTE — Therapy (Signed)
[facility] [address] [place], [place], [address] Phone: [phone]   Fax:  [phone]  Physical Therapy Treatment  Patient Details  Name: Julie Lara MRN: [id number] Date of Birth: June 16, 1920 Referring Provider:  Burnard Bunting, MD  Encounter Date: 11/17/2014      PT End of Session - 11/17/14 1322    Visit Number 9   Number of Visits 17   Date for PT Re-Evaluation 12/20/14   Authorization Type Medicare - G Codes every 10 visits   PT Start Time 5790   PT Stop Time 1400   PT Time Calculation (min) 43 min   Equipment Utilized During Treatment Gait belt   Activity Tolerance Patient tolerated treatment well   Behavior During Therapy [facility] for tasks assessed/performed      Past Medical History  Diagnosis Date  . 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 History of thyroidectomy   . Dilated cardiomyopathy   . Aortic insufficiency     mild to moderate  . PAC (premature atrial contraction)     Chronic  . Hypothyroidism   . Idiopathic cardiomyopathy 1999    EF 40% by Echo on 10/13/12  . Goiter   . NSTEMI (non-ST elevated myocardial infarction) July 2014    95% prox RCA s/p BMS  . Atrial fibrillation   . CAD (coronary artery disease)   . Acute blood loss anemia     November 2014    Past Surgical History  Procedure Laterality Date  . Cardiac catheterization    . Thyroidectomy  2008  . Total vaginal hysterectomy      Complete  . Coronary angioplasty with stent placement      BMS-95% prox RCA  . Biopsy of sacral lesion  2010  . Esophagogastroduodenoscopy N/A 02/22/2013    Procedure: ESOPHAGOGASTRODUODENOSCOPY (EGD);  Surgeon: Milus Banister, MD;  Location: [place];  Service: Endoscopy;  Laterality: N/A;  . Colonoscopy N/A 02/23/2013    Procedure: COLONOSCOPY;  Surgeon: Ladene Artist, MD;  Location: [facility] ENDOSCOPY;  Service: Endoscopy;  Laterality: N/A;  . Left heart  catheterization with coronary angiogram N/A 10/14/2012    Procedure: LEFT HEART CATHETERIZATION WITH CORONARY ANGIOGRAM;  Surgeon: Wellington Hampshire, MD;  Location: [place] CATH LAB;  Service: Cardiovascular;  Laterality: N/A;  . Percutaneous coronary stent intervention (pci-s) Right 10/14/2012    Procedure: PERCUTANEOUS CORONARY STENT INTERVENTION (PCI-S);  Surgeon: Wellington Hampshire, MD;  Location: [facility] CATH LAB;  Service: Cardiovascular;  Laterality: Right;    Filed Vitals:   11/17/14 1324  BP: 113/72  Pulse: 78    Visit Diagnosis:  Abnormality of gait  Unsteadiness  Weakness generalized      Subjective Assessment - 11/17/14 1320    Subjective No new complaints. Reports her daughter wants [place] to check her BP. No falls or pain to report. States she did take her BP medication today.   Currently in Pain? No/denies           [facility] Adult PT Treatment/Exercise - 11/17/14 1350    Ambulation/Gait   Ambulation/Gait Yes   Ambulation/Gait Assistance 5: Supervision;4: Min guard;4: Min assist   Ambulation/Gait Assistance Details moderate cues on sequence and cane use with gait. cues also on posture and for equal step length/foot clearance with gait   Ambulation Distance (Feet) 100 Feet  x1, 210 x1   Assistive device Straight cane   Gait Pattern Step-through pattern;Narrow base of support;Trunk flexed;Trendelenburg;Decreased dorsiflexion -  left;Decreased hip/knee flexion - right;Decreased hip/knee flexion - left;Decreased stride length   Ambulation Surface Level;Indoor          Balance Exercises - 11/17/14 1325    OTAGO PROGRAM   Head Movements Sitting;5 reps   Neck Movements Sitting;5 reps  cues on form   Back Extension Standing;5 reps   Trunk Movements Standing;5 reps   Ankle Movements Standing;10 reps   Knee Extensor 10 reps   Knee Flexor 10 reps   Hip ABductor 10 reps   Ankle Plantorflexors 20 reps, support   Ankle Dorsiflexors 20 reps, support   Knee Bends 10 reps, support    Backwards Walking Support   Walking and Turning Around Assistive device   Sideways Walking Assistive device   Tandem Stance 10 seconds, support   Tandem Walk Support   One Leg Stand 10 seconds, support   Heel Walking Support   Toe Walk Support   Sit to Stand 10 reps, no support             PT Short Term Goals - 11/18/14 1810    PT SHORT TERM GOAL #1   Title Pt will perform home exercises with mod I using paper handout to indicate safe daily compliance with HEP. Target date: 11/18/14   Baseline 11/17/14/-pt independent with HEP issued up till today   Status Achieved   PT SHORT TERM GOAL #2   Title Pt will negotiate four small (4.5") steps with no rails and mod I using LRAD to enable pt to safely enter/exit home.Target date: 11/18/14   Baseline Met 8/15 without AD, as pt demonstrates increased difficulty with SPC.   Status Achieved   PT SHORT TERM GOAL #3   Title Pt will negotiate standard curb step without rail with mod I using LRAD to enable pt to enter primary entrance of home, to indicate safety with community mobility.Target date: 11/18/14   Baseline Met 8/15 without AD, as pt demonstrates increased difficulty with SPC.   Status Achieved   PT SHORT TERM GOAL #4   Title Pt will verbalize understanding of fall prevention strategies to decrease fall risk within home. Target date: 11/18/14   Baseline Achieved 8/1.   Status Achieved           PT Long Term Goals - 10/25/14 1819    PT LONG TERM GOAL #1   Title Pt will increase FGA score from 11/30 to 19/30 to indicate significant improvement in dynamic gait stability. Target date: 12/16/14   Status On-going   PT LONG TERM GOAL #2   Title Pt will independently ambulate >200' over level, indoor surfaces with effective obstacle negotiation, no overt LOB to indicate safety with household mobility. Target date: 12/16/14   Status On-going   PT LONG TERM GOAL #3   Title Pt will ambulate >500' over unlevel, outdoor surfaces (grass and  asphalt) with mod I using LRAD with no overt LOB to progress toward sfety with community mobility. Target date: 12/16/14   Status On-going   PT LONG TERM GOAL #4   Title Pt will improve Activities-specific Balance Confidence (ABC) scale score by 10 points from baseline to indicate improved balance confidence. Target date: 12/16/14   Baseline Baseline score: 44.4%   Status On-going           Plan - 11/17/14 1323    Clinical Impression Statement Pt met STG number 1 today, making all STG's met. Pt also making steady progress toward LTG's as well.   Pt will  benefit from skilled therapeutic intervention in order to improve on the following deficits Abnormal gait;Decreased balance;Decreased strength;Postural dysfunction;Pain;Decreased coordination;Decreased knowledge of use of DME   Rehab Potential Good   PT Frequency 2x / week   PT Duration 8 weeks   PT Treatment/Interventions ADLs/Self Care Home Management;Therapeutic activities;Therapeutic exercise;Neuromuscular re-education;Balance training;Cognitive remediation;Patient/family education;Orthotic Fit/Training;Gait training;Stair training;DME Instruction;Manual techniques;Vestibular;Functional mobility training   PT Next Visit Plan Assess postural stability standing on foam with head turns and continue toward LTG's.   PT Home Exercise Plan [facility].   Consulted and Agree with Plan of Care Patient        Problem List Patient Active Problem List   Diagnosis Date Noted  . Acute blood loss anemia 03/12/2013  . CAD (coronary artery disease)   . Benign neoplasm of colon 02/23/2013  . Nonspecific abnormal finding in stool contents 02/23/2013  . Near syncope 02/21/2013  . Anemia 02/21/2013  . Acute on chronic renal failure 02/21/2013  . Chronic systolic congestive heart failure 02/21/2013  . GI bleed 02/21/2013  . Chronic anticoagulation 02/21/2013  . Coronary artery disease with history of myocardial infarction without history of CABG  02/21/2013  . Resting chest pain 10/27/2012  . Hypothyroidism 10/15/2012  . Atrial fibrillation 10/15/2012  . CKD (chronic kidney disease), stage III 10/15/2012  . UTI (urinary tract infection) 10/15/2012  . Dilated cardiomyopathy   . Aortic insufficiency   . PAC (premature atrial contraction)   . Chest pain 04/02/2010    Willow Ora 11/18/2014, 6:18 PM  Willow Ora, PTA, [facility] [address] [place], [place] [address] [phone] 11/18/2014, 6:18 PM

## 2014-11-22 ENCOUNTER — Ambulatory Visit (INDEPENDENT_AMBULATORY_CARE_PROVIDER_SITE_OTHER): Payer: Medicare Other | Admitting: Cardiology

## 2014-11-22 ENCOUNTER — Encounter: Payer: Self-pay | Admitting: Cardiology

## 2014-11-22 VITALS — BP 128/72 | HR 72 | Ht 64.0 in | Wt 139.0 lb

## 2014-11-22 DIAGNOSIS — I2510 Atherosclerotic heart disease of native coronary artery without angina pectoris: Secondary | ICD-10-CM | POA: Diagnosis not present

## 2014-11-22 DIAGNOSIS — I480 Paroxysmal atrial fibrillation: Secondary | ICD-10-CM | POA: Diagnosis not present

## 2014-11-22 DIAGNOSIS — N183 Chronic kidney disease, stage 3 unspecified: Secondary | ICD-10-CM

## 2014-11-22 DIAGNOSIS — I5022 Chronic systolic (congestive) heart failure: Secondary | ICD-10-CM | POA: Diagnosis not present

## 2014-11-22 NOTE — Patient Instructions (Signed)
Continue your current therapy  I will see you in 6 months.   

## 2014-11-22 NOTE — Progress Notes (Signed)
Julie Lara Date of Birth: 01-16-1921 Medical Record [id number]  History of Present Illness: Julie Lara is seen back for a follow up visit. She is a 81 year old female with a history of CAD, PAF, dilated CM, AI, PACs, hypothyroidism, CKD stage III and prior UTIs.  Admitted to the hospital with atrial fib with RVR in July 2014. Was placed on Eliquis. Converted to NSR with PACx. Troponins slightly up and so she was cathed and had BMS to the proximal RCA. EF per echo was 40%, moderate LV dilatation, mild LVH, mild MR, mild biatrial enlargement and changes questionable for IVC thrombus. She was continued on Plavix for one month, then this was discontinued.  In November 2014 she was admitted with profound anemia Hgb 6.6 and heme positive stool. Upper and lower endoscopy were negative. Capsule videoendoscopy was also negative. It was decided not to resume anticoagulation.   On follow up today she is doing well. Denies any chest pain, dyspnea, palpitations. No dizziness. Really no complaints today.    Current Outpatient Prescriptions  Medication Sig Dispense Refill  . atorvastatin (LIPITOR) 40 MG tablet TAKE 1 TABLET DAILY AT 6PM 90 tablet 2  . Cholecalciferol (VITAMIN D PO) Take 1,000 Units by mouth daily.     . iron polysaccharides (FERREX 150) 150 MG capsule Take 150 mg by mouth daily.     Marland Kitchen levothyroxine (SYNTHROID, LEVOTHROID) 25 MCG tablet Take 25 mcg by mouth daily before breakfast.    . metoprolol tartrate (LOPRESSOR) 25 MG tablet TAKE 1/2 TABLET (12.5MG )   TWO TIMES A DAY 90 tablet 1  . nitroGLYCERIN (NITROSTAT) 0.4 MG SL tablet Place 1 tablet (0.4 mg total) under the tongue every 5 (five) minutes as needed for chest pain. 25 tablet 3  . pantoprazole (PROTONIX) 40 MG tablet Take 1 tablet (40 mg total) by mouth 2 (two) times daily before a meal. 60 tablet 5   No current facility-administered medications for this visit.    No Known Allergies  Past Medical History  Diagnosis Date   . 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 History of thyroidectomy   . Dilated cardiomyopathy   . Aortic insufficiency     mild to moderate  . PAC (premature atrial contraction)     Chronic  . Hypothyroidism   . Idiopathic cardiomyopathy 1999    EF 40% by Echo on 10/13/12  . Goiter   . NSTEMI (non-ST elevated myocardial infarction) July 2014    95% prox RCA s/p BMS  . Atrial fibrillation   . CAD (coronary artery disease)   . Acute blood loss anemia     November 2014    Past Surgical History  Procedure Laterality Date  . Cardiac catheterization    . Thyroidectomy  2008  . Total vaginal hysterectomy      Complete  . Coronary angioplasty with stent placement      BMS-95% prox RCA  . Biopsy of sacral lesion  2010  . Esophagogastroduodenoscopy N/A 02/22/2013    Procedure: ESOPHAGOGASTRODUODENOSCOPY (EGD);  Surgeon: Milus Banister, MD;  Location: [place];  Service: Endoscopy;  Laterality: N/A;  . Colonoscopy N/A 02/23/2013    Procedure: COLONOSCOPY;  Surgeon: Ladene Artist, MD;  Location: [facility] ENDOSCOPY;  Service: Endoscopy;  Laterality: N/A;  . Left heart catheterization with coronary angiogram N/A 10/14/2012    Procedure: LEFT HEART CATHETERIZATION WITH CORONARY ANGIOGRAM;  Surgeon: Wellington Hampshire, MD;  Location: [place] CATH LAB;  Service: Cardiovascular;  Laterality: N/A;  . Percutaneous coronary stent intervention (pci-s) Right 10/14/2012    Procedure: PERCUTANEOUS CORONARY STENT INTERVENTION (PCI-S);  Surgeon: Wellington Hampshire, MD;  Location: [facility] CATH LAB;  Service: Cardiovascular;  Laterality: Right;    History  Smoking status  . Never Smoker   Smokeless tobacco  . Never Used    History  Alcohol Use No    Family History  Problem Relation Age of Onset  . Diabetes Sister     Review of Systems: The review of systems is per the HPI.  All other systems were reviewed and are negative.  Physical Exam: BP 128/72 mmHg  Pulse 72  Ht 5\' 4"   (1.626 m)  Wt 63.05 kg (139 lb)  BMI 23.85 kg/m2 Patient is very pleasant and in no acute distress.  Skin is warm and dry. Color is normal.  HEENT is unremarkable. Normocephalic/atraumatic. PERRL. Sclera are nonicteric. Neck is supple. No masses. No JVD. Lungs are clear. Cardiac exam shows an irregular rhythm. Rate is controlled. Abdomen is soft. Extremities full but are without edema. Gait and ROM are intact. No gross neurologic deficits noted.  LABORATORY DATA:  none   Assessment / Plan:  1. CAD - s/p NSTEMI in July 2014- s/p cath with PCI with BMS to the RCA - EF of 40% per her echo - she continues to do well. On no antiplatelet therapy due to history of GI bleed.   2. Atrial fib -  only episode was in July 2014. No evidence of recurrence since then. Ecg in February showed PACs adn PVCs. Given history of major GI bleed will leave off anticoagulation  3. Hypothyroidism   4. HLD -  on statin therapy.   5. LV dysfunction - EF was 40% by echo - currently looks well compensated. Would favor continued medical management.   6. GI bleed. Source unclear.  No clear recurrence.  Labs are followed regularly by Dr. Reynaldo Minium. I will follow up in 6 months.

## 2014-11-23 ENCOUNTER — Encounter: Payer: Self-pay | Admitting: Physical Therapy

## 2014-11-23 ENCOUNTER — Ambulatory Visit: Payer: Medicare Other | Admitting: Physical Therapy

## 2014-11-23 DIAGNOSIS — R269 Unspecified abnormalities of gait and mobility: Secondary | ICD-10-CM

## 2014-11-23 DIAGNOSIS — R2681 Unsteadiness on feet: Secondary | ICD-10-CM

## 2014-11-23 DIAGNOSIS — R531 Weakness: Secondary | ICD-10-CM

## 2014-11-23 NOTE — Therapy (Signed)
[facility] [address] [place], [place], [address] Phone: [phone]   Fax:  [phone]  Physical Therapy Treatment  Patient Details  Name: Julie Lara MRN: [id number] Date of Birth: 09/11/1920 Referring Provider:  Burnard Bunting, MD  Encounter Date: 11/23/2014      PT End of Session - 11/23/14 1405    Visit Number 10   Number of Visits 17   Date for PT Re-Evaluation 12/20/14   Authorization Type Medicare - G Codes every 10 visits   PT Start Time 3532   PT Stop Time 1443   PT Time Calculation (min) 41 min   Equipment Utilized During Treatment Gait belt   Activity Tolerance Patient tolerated treatment well   Behavior During Therapy [facility] for tasks assessed/performed      Past Medical History  Diagnosis Date  . 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 History of thyroidectomy   . Dilated cardiomyopathy   . Aortic insufficiency     mild to moderate  . PAC (premature atrial contraction)     Chronic  . Hypothyroidism   . Idiopathic cardiomyopathy 1999    EF 40% by Echo on 10/13/12  . Goiter   . NSTEMI (non-ST elevated myocardial infarction) July 2014    95% prox RCA s/p BMS  . Atrial fibrillation   . CAD (coronary artery disease)   . Acute blood loss anemia     November 2014    Past Surgical History  Procedure Laterality Date  . Cardiac catheterization    . Thyroidectomy  2008  . Total vaginal hysterectomy      Complete  . Coronary angioplasty with stent placement      BMS-95% prox RCA  . Biopsy of sacral lesion  2010  . Esophagogastroduodenoscopy N/A 02/22/2013    Procedure: ESOPHAGOGASTRODUODENOSCOPY (EGD);  Surgeon: Milus Banister, MD;  Location: [facility];  Service: Endoscopy;  Laterality: N/A;  . Colonoscopy N/A 02/23/2013    Procedure: COLONOSCOPY;  Surgeon: Ladene Artist, MD;  Location: [facility] ENDOSCOPY;  Service: Endoscopy;  Laterality: N/A;  . Left heart  catheterization with coronary angiogram N/A 10/14/2012    Procedure: LEFT HEART CATHETERIZATION WITH CORONARY ANGIOGRAM;  Surgeon: Wellington Hampshire, MD;  Location: [facility] CATH LAB;  Service: Cardiovascular;  Laterality: N/A;  . Percutaneous coronary stent intervention (pci-s) Right 10/14/2012    Procedure: PERCUTANEOUS CORONARY STENT INTERVENTION (PCI-S);  Surgeon: Wellington Hampshire, MD;  Location: [facility] CATH LAB;  Service: Cardiovascular;  Laterality: Right;    There were no vitals filed for this visit.  Visit Diagnosis:  Abnormality of gait  Unsteadiness  Weakness generalized      Subjective Assessment - 11/23/14 1404    Subjective No new complaints. No falls or pain to report.    Currently in Pain? No/denies           [facility] PT Assessment - 11/23/14 1406    Functional Gait  Assessment   Gait assessed  Yes   Gait Level Surface Walks 20 ft in less than 7 sec but greater than 5.5 sec, uses assistive device, slower speed, mild gait deviations, or deviates 6-10 in outside of the 12 in walkway width.   Change in Gait Speed Able to smoothly change walking speed without loss of balance or gait deviation. Deviate no more than 6 in outside of the 12 in walkway width.   Gait with Horizontal Head Turns Performs head turns smoothly with slight  change in gait velocity (eg, minor disruption to smooth gait path), deviates 6-10 in outside 12 in walkway width, or uses an assistive device.   Gait with Vertical Head Turns Performs task with slight change in gait velocity (eg, minor disruption to smooth gait path), deviates 6 - 10 in outside 12 in walkway width or uses assistive device   Gait and Pivot Turn Pivot turns safely in greater than 3 sec and stops with no loss of balance, or pivot turns safely within 3 sec and stops with mild imbalance, requires small steps to catch balance.   Step Over Obstacle Is able to step over one shoe box (4.5 in total height) but must slow down and adjust steps to clear box  safely. May require verbal cueing.   Gait with Narrow Base of Support Ambulates 4-7 steps.   Gait with Eyes Closed Cannot walk 20 ft without assistance, severe gait deviations or imbalance, deviates greater than 15 in outside 12 in walkway width or will not attempt task.   Ambulating Backwards Walks 20 ft, slow speed, abnormal gait pattern, evidence for imbalance, deviates 10-15 in outside 12 in walkway width.   Steps Alternating feet, must use rail.   Total Score 16     Neuro Re-ed: Standing on air ex - eyes open head nods, and head shakes - eyes closed no head movement, head shakes, head nods  Both ways on balance board Eyes open - holding board steady, no movements - rocking with tall posture - head nods - head shakes  Eyes closed - hold board steady   Exercise Scifit level x 4 extremities level 1.0 x 8 minutes with goal rpm >/= 60 for strengthening and activity tolerance        PT Short Term Goals - 11/18/14 1810    PT SHORT TERM GOAL #1   Title Pt will perform home exercises with mod I using paper handout to indicate safe daily compliance with HEP. Target date: 11/18/14   Baseline 11/17/14/-pt independent with HEP issued up till today   Status Achieved   PT SHORT TERM GOAL #2   Title Pt will negotiate four small (4.5") steps with no rails and mod I using LRAD to enable pt to safely enter/exit home.Target date: 11/18/14   Baseline Met 8/15 without AD, as pt demonstrates increased difficulty with SPC.   Status Achieved   PT SHORT TERM GOAL #3   Title Pt will negotiate standard curb step without rail with mod I using LRAD to enable pt to enter primary entrance of home, to indicate safety with community mobility.Target date: 11/18/14   Baseline Met 8/15 without AD, as pt demonstrates increased difficulty with SPC.   Status Achieved   PT SHORT TERM GOAL #4   Title Pt will verbalize understanding of fall prevention strategies to decrease fall risk within home. Target date:  11/18/14   Baseline Achieved 8/1.   Status Achieved           PT Long Term Goals - 11/23/14 1438    PT LONG TERM GOAL #1   Title Pt will increase FGA score from 11/30 to 19/30 to indicate significant improvement in dynamic gait stability. Target date: 12/16/14   Baseline 11/23/14: pt scorted 19/30 today    Status Achieved   PT LONG TERM GOAL #2   Title Pt will independently ambulate >200' over level, indoor surfaces with effective obstacle negotiation, no overt LOB to indicate safety with household mobility. Target date: 12/16/14  Status On-going   PT LONG TERM GOAL #3   Title Pt will ambulate >500' over unlevel, outdoor surfaces (grass and asphalt) with mod I using LRAD with no overt LOB to progress toward sfety with community mobility. Target date: 12/16/14   Status On-going   PT LONG TERM GOAL #4   Title Pt will improve Activities-specific Balance Confidence (ABC) scale score by 10 points from baseline to indicate improved balance confidence. Target date: 12/16/14   Baseline Baseline score: 44.4%   Status On-going               Plan - 11/23/14 1405    Clinical Impression Statement Pt with increased score today on the Functional Gait Assessment, LTG  met. Progressing well towards other goals as well   Pt will benefit from skilled therapeutic intervention in order to improve on the following deficits Abnormal gait;Decreased balance;Decreased strength;Postural dysfunction;Pain;Decreased coordination;Decreased knowledge of use of DME   Rehab Potential Good   PT Frequency 2x / week   PT Duration 8 weeks   PT Treatment/Interventions ADLs/Self Care Home Management;Therapeutic activities;Therapeutic exercise;Neuromuscular re-education;Balance training;Cognitive remediation;Patient/family education;Orthotic Fit/Training;Gait training;Stair training;DME Instruction;Manual techniques;Vestibular;Functional mobility training   PT Next Visit Plan Continue toward LTG's not met.   PT Home  Exercise Plan [facility].   Consulted and Agree with Plan of Care Patient        Problem List Patient Active Problem List   Diagnosis Date Noted  . Acute blood loss anemia 03/12/2013  . CAD (coronary artery disease)   . Benign neoplasm of colon 02/23/2013  . Nonspecific abnormal finding in stool contents 02/23/2013  . Near syncope 02/21/2013  . Anemia 02/21/2013  . Acute on chronic renal failure 02/21/2013  . Chronic systolic congestive heart failure 02/21/2013  . GI bleed 02/21/2013  . Chronic anticoagulation 02/21/2013  . Coronary artery disease with history of myocardial infarction without history of CABG 02/21/2013  . Resting chest pain 10/27/2012  . Hypothyroidism 10/15/2012  . Atrial fibrillation 10/15/2012  . CKD (chronic kidney disease), stage III 10/15/2012  . UTI (urinary tract infection) 10/15/2012  . Dilated cardiomyopathy   . Aortic insufficiency   . PAC (premature atrial contraction)   . Chest pain 04/02/2010    Willow Ora December 11, 2014, 10:01 AM  Willow Ora, PTA, [facility] Outpatient Neuro [facility] [address], [place], [place] [address] [phone] 12/11/14, 10:01 AM  Addendum: G Codes entered by Billie Ruddy, PT, [place] [address] [place], [place], [address] Phone: [phone]   Fax:  [phone] 11/25/2014, 2:13 PM  Functional Assessment Tool: FGA: 19/30  Functional Limitation: Mobility: Walking and Moving Around Current Status [phone]): CJ Goal Status  [phone]): CJ  Physical Therapy Progress Note  Dates of Reporting Period: 10/21/14 to 11/25/14  Objective Reports of Subjective Statement: See above.  Objective Measurements: See above. FGA.  Goal Update: See above. LTG for FGA met.  Plan: continue per POC  Reason Skilled Services are Required: continue to progress functional mobility, decrease fall risk  Billie Ruddy, PT, DPT [facility] [address] [place], [place], [address] Phone: [phone]   Fax:  [phone] 11/25/2014, 2:15 PM

## 2014-11-24 DIAGNOSIS — I5022 Chronic systolic (congestive) heart failure: Secondary | ICD-10-CM | POA: Diagnosis not present

## 2014-11-24 DIAGNOSIS — E038 Other specified hypothyroidism: Secondary | ICD-10-CM | POA: Diagnosis not present

## 2014-11-24 DIAGNOSIS — I129 Hypertensive chronic kidney disease with stage 1 through stage 4 chronic kidney disease, or unspecified chronic kidney disease: Secondary | ICD-10-CM | POA: Diagnosis not present

## 2014-11-24 DIAGNOSIS — E785 Hyperlipidemia, unspecified: Secondary | ICD-10-CM | POA: Diagnosis not present

## 2014-11-24 DIAGNOSIS — N183 Chronic kidney disease, stage 3 (moderate): Secondary | ICD-10-CM | POA: Diagnosis not present

## 2014-11-24 DIAGNOSIS — I420 Dilated cardiomyopathy: Secondary | ICD-10-CM | POA: Diagnosis not present

## 2014-11-24 DIAGNOSIS — N179 Acute kidney failure, unspecified: Secondary | ICD-10-CM | POA: Diagnosis not present

## 2014-11-24 DIAGNOSIS — I4891 Unspecified atrial fibrillation: Secondary | ICD-10-CM | POA: Diagnosis not present

## 2014-11-24 DIAGNOSIS — C801 Malignant (primary) neoplasm, unspecified: Secondary | ICD-10-CM | POA: Diagnosis not present

## 2014-11-24 DIAGNOSIS — Z0000 Encounter for general adult medical examination without abnormal findings: Secondary | ICD-10-CM | POA: Diagnosis not present

## 2014-11-24 DIAGNOSIS — Z6825 Body mass index (BMI) 25.0-25.9, adult: Secondary | ICD-10-CM | POA: Diagnosis not present

## 2014-11-24 DIAGNOSIS — I10 Essential (primary) hypertension: Secondary | ICD-10-CM | POA: Diagnosis not present

## 2014-11-25 ENCOUNTER — Ambulatory Visit: Payer: Medicare Other | Admitting: Physical Therapy

## 2014-11-25 DIAGNOSIS — R2681 Unsteadiness on feet: Secondary | ICD-10-CM

## 2014-11-25 DIAGNOSIS — R269 Unspecified abnormalities of gait and mobility: Secondary | ICD-10-CM

## 2014-11-25 DIAGNOSIS — R531 Weakness: Secondary | ICD-10-CM | POA: Diagnosis not present

## 2014-11-25 NOTE — Patient Instructions (Signed)
Strengthening: Hip Abductor - Resisted   With GREEN band looped around both legs above knees, push RIGHT thigh outward. Then, SLOWLY return to starting position. Repeat __20__ times per set. Do __2__ sets per day.

## 2014-11-25 NOTE — Therapy (Signed)
[facility] [address] [place], [place], [address] Phone: [phone]   Fax:  [phone]  Physical Therapy Treatment  Patient Details  Name: Julie Lara MRN: [id number] Date of Birth: April 13, 1920 Referring Provider:  Burnard Bunting, MD  Encounter Date: 11/25/2014      PT End of Session - 11/25/14 2014    Visit Number 11   Number of Visits 17   Date for PT Re-Evaluation 12/20/14   Authorization Type Medicare - G Codes every 10 visits   PT Start Time 3235   PT Stop Time 1447   PT Time Calculation (min) 42 min   Equipment Utilized During Treatment Gait belt   Activity Tolerance Patient tolerated treatment well   Behavior During Therapy [facility] for tasks assessed/performed      Past Medical History  Diagnosis Date  . 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 History of thyroidectomy   . Dilated cardiomyopathy   . Aortic insufficiency     mild to moderate  . PAC (premature atrial contraction)     Chronic  . Hypothyroidism   . Idiopathic cardiomyopathy 1999    EF 40% by Echo on 10/13/12  . Goiter   . NSTEMI (non-ST elevated myocardial infarction) July 2014    95% prox RCA s/p BMS  . Atrial fibrillation   . CAD (coronary artery disease)   . Acute blood loss anemia     November 2014    Past Surgical History  Procedure Laterality Date  . Cardiac catheterization    . Thyroidectomy  2008  . Total vaginal hysterectomy      Complete  . Coronary angioplasty with stent placement      BMS-95% prox RCA  . Biopsy of sacral lesion  2010  . Esophagogastroduodenoscopy N/A 02/22/2013    Procedure: ESOPHAGOGASTRODUODENOSCOPY (EGD);  Surgeon: Milus Banister, MD;  Location: [place];  Service: Endoscopy;  Laterality: N/A;  . Colonoscopy N/A 02/23/2013    Procedure: COLONOSCOPY;  Surgeon: Ladene Artist, MD;  Location: [facility] ENDOSCOPY;  Service: Endoscopy;  Laterality: N/A;  . Left heart  catheterization with coronary angiogram N/A 10/14/2012    Procedure: LEFT HEART CATHETERIZATION WITH CORONARY ANGIOGRAM;  Surgeon: Wellington Hampshire, MD;  Location: [place] CATH LAB;  Service: Cardiovascular;  Laterality: N/A;  . Percutaneous coronary stent intervention (pci-s) Right 10/14/2012    Procedure: PERCUTANEOUS CORONARY STENT INTERVENTION (PCI-S);  Surgeon: Wellington Hampshire, MD;  Location: [facility] CATH LAB;  Service: Cardiovascular;  Laterality: Right;    There were no vitals filed for this visit.  Visit Diagnosis:  Abnormality of gait  Unsteadiness  Weakness generalized      Subjective Assessment - 11/25/14 2005    Subjective "I walk inside my house without my cane; as long as I can just touch a piece of furniture, I'm fine. Sometimes I go outside and just pick up sticks in the yard - I don't need a cane for that." Pt reports no falls.   Pertinent History abnormal brain MRI (Per MD note, cervical spine lesions are "likely malignant" but no sypmtoms),  A-fib, degenerative disc disease, CHF, cardiomyopathy, anemia, HTN   Patient Stated Goals "Work on my right leg to make it stronger and not walk around like I'm drunk."   Currently in Pain? No/denies                         [facility] Adult PT  Treatment/Exercise - 11/25/14 0001    Ambulation/Gait   Ambulation/Gait Yes   Ambulation/Gait Assistance 5: Supervision;4: Min guard   Ambulation/Gait Assistance Details Supervision over level surfaces without AD. Supervision to min guard over unlevel, paved surfaces with SPC.   Ambulation Distance (Feet) 550 Feet   Assistive device Straight cane;None   Gait Pattern Step-through pattern;Narrow base of support;Trunk flexed;Trendelenburg;Decreased dorsiflexion - left;Decreased stride length;Scissoring   Ambulation Surface Level;Unlevel;Indoor;Paved;Outdoor   Curb 6: Modified independent (Device/increase time)  outdoor curb   Curb Details (indicate cue type and reason) with [facility]                 PT Education - 11/25/14 2002    Education provided Yes   Education Details HEP: added Theraband-resisted R hip ABD. Recommending pt utilize cane for household ambulation. Strongly recommended pt use cane and have hands-on assist of daughter when ambulating over unlevel grass.   Person(s) Educated Patient   Methods Explanation;Demonstration;Verbal cues;Handout;Tactile cues   Comprehension Returned demonstration;Verbalized understanding          PT Short Term Goals - 11/18/14 1810    PT SHORT TERM GOAL #1   Title Pt will perform home exercises with mod I using paper handout to indicate safe daily compliance with HEP. Target date: 11/18/14   Baseline 11/17/14/-pt independent with HEP issued up till today   Status Achieved   PT SHORT TERM GOAL #2   Title Pt will negotiate four small (4.5") steps with no rails and mod I using LRAD to enable pt to safely enter/exit home.Target date: 11/18/14   Baseline Met 8/15 without AD, as pt demonstrates increased difficulty with SPC.   Status Achieved   PT SHORT TERM GOAL #3   Title Pt will negotiate standard curb step without rail with mod I using LRAD to enable pt to enter primary entrance of home, to indicate safety with community mobility.Target date: 11/18/14   Baseline Met 8/15 without AD, as pt demonstrates increased difficulty with SPC.   Status Achieved   PT SHORT TERM GOAL #4   Title Pt will verbalize understanding of fall prevention strategies to decrease fall risk within home. Target date: 11/18/14   Baseline Achieved 8/1.   Status Achieved           PT Long Term Goals - 11/25/14 1424    PT LONG TERM GOAL #1   Title Pt will increase FGA score from 11/30 to 19/30 to indicate significant improvement in dynamic gait stability. Target date: 12/16/14   Baseline 11/23/14: pt scorted 19/30 today    Status Achieved   PT LONG TERM GOAL #2   Title Pt will independently ambulate >200' over level, indoor surfaces with effective  obstacle negotiation, no overt LOB to indicate safety with household mobility.     Revised goal: Pt will ambulate >200' over level surfaces with mod I using LRAD.  Target date: 12/16/14   Baseline Goal revised 8/25.   Status Revised   PT LONG TERM GOAL #3   Title Pt will ambulate >500' over unlevel, asphalt surfaces with mod I using LRAD with no overt LOB to progress toward sfety with community mobility. Target date: 12/16/14   Baseline Goal revised 8/25 to exclude grass surfaces due to safety concerns.   Status Revised   PT LONG TERM GOAL #4   Title Pt will improve Activities-specific Balance Confidence (ABC) scale score by 10 points from baseline to indicate improved balance confidence. Target date: 12/16/14   Baseline Baseline score:  44.4%   Status On-going               Plan - 11/25/14 2019    Clinical Impression Statement Despite recommendations for use of SPC for all mobility, pt continues to report not using cane for household or outdoor ambulation. Revised long term goals to reflect the following recommendations: ambulation over level surfaces and for household distances with [facility]; and that pt ambulate in unlevel grass only with use of SPC and with CGA of family member. Continue per POC.   Pt will benefit from skilled therapeutic intervention in order to improve on the following deficits Abnormal gait;Decreased balance;Decreased strength;Postural dysfunction;Pain;Decreased coordination;Decreased knowledge of use of DME   Rehab Potential Good   PT Frequency 2x / week   PT Duration 8 weeks   PT Treatment/Interventions ADLs/Self Care Home Management;Therapeutic activities;Therapeutic exercise;Neuromuscular re-education;Balance training;Cognitive remediation;Patient/family education;Orthotic Fit/Training;Gait training;Stair training;DME Instruction;Manual techniques;Vestibular;Functional mobility training   PT Next Visit Plan Gait training: focus on progressing toward mod I with SPC over  level surfaces, unlevel asphalt.   PT Home Exercise Plan [facility].   Consulted and Agree with Plan of Care Patient        Problem List Patient Active Problem List   Diagnosis Date Noted  . Acute blood loss anemia 03/12/2013  . CAD (coronary artery disease)   . Benign neoplasm of colon 02/23/2013  . Nonspecific abnormal finding in stool contents 02/23/2013  . Near syncope 02/21/2013  . Anemia 02/21/2013  . Acute on chronic renal failure 02/21/2013  . Chronic systolic congestive heart failure 02/21/2013  . GI bleed 02/21/2013  . Chronic anticoagulation 02/21/2013  . Coronary artery disease with history of myocardial infarction without history of CABG 02/21/2013  . Resting chest pain 10/27/2012  . Hypothyroidism 10/15/2012  . Atrial fibrillation 10/15/2012  . CKD (chronic kidney disease), stage III 10/15/2012  . UTI (urinary tract infection) 10/15/2012  . Dilated cardiomyopathy   . Aortic insufficiency   . PAC (premature atrial contraction)   . Chest pain 04/02/2010    Billie Ruddy, PT, DPT [facility] [address] [place], [place], [address] Phone: [phone]   Fax:  [phone] 11/25/2014, 8:25 PM

## 2014-11-29 ENCOUNTER — Ambulatory Visit: Payer: Medicare Other | Admitting: Physical Therapy

## 2014-11-29 DIAGNOSIS — R2681 Unsteadiness on feet: Secondary | ICD-10-CM | POA: Diagnosis not present

## 2014-11-29 DIAGNOSIS — R531 Weakness: Secondary | ICD-10-CM

## 2014-11-29 DIAGNOSIS — R269 Unspecified abnormalities of gait and mobility: Secondary | ICD-10-CM | POA: Diagnosis not present

## 2014-11-29 NOTE — Therapy (Signed)
[facility] [address] [place], [place], [address] Phone: [phone]   Fax:  [phone]  Physical Therapy Treatment  Patient Details  Name: Julie Lara MRN: [id number] Date of Birth: 04/01/1921 Referring Provider:  Burnard Bunting, MD  Encounter Date: 11/29/2014      PT End of Session - 11/29/14 1249    Visit Number 12   Number of Visits 17   Date for PT Re-Evaluation 12/20/14   Authorization Type Medicare - G Codes every 10 visits   PT Start Time 1151   PT Stop Time 1230   PT Time Calculation (min) 39 min   Equipment Utilized During Treatment Gait belt   Activity Tolerance Patient tolerated treatment well   Behavior During Therapy [facility] for tasks assessed/performed      Past Medical History  Diagnosis Date  . 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 History of thyroidectomy   . Dilated cardiomyopathy   . Aortic insufficiency     mild to moderate  . PAC (premature atrial contraction)     Chronic  . Hypothyroidism   . Idiopathic cardiomyopathy 1999    EF 40% by Echo on 10/13/12  . Goiter   . NSTEMI (non-ST elevated myocardial infarction) July 2014    95% prox RCA s/p BMS  . Atrial fibrillation   . CAD (coronary artery disease)   . Acute blood loss anemia     November 2014    Past Surgical History  Procedure Laterality Date  . Cardiac catheterization    . Thyroidectomy  2008  . Total vaginal hysterectomy      Complete  . Coronary angioplasty with stent placement      BMS-95% prox RCA  . Biopsy of sacral lesion  2010  . Esophagogastroduodenoscopy N/A 02/22/2013    Procedure: ESOPHAGOGASTRODUODENOSCOPY (EGD);  Surgeon: Milus Banister, MD;  Location: [place];  Service: Endoscopy;  Laterality: N/A;  . Colonoscopy N/A 02/23/2013    Procedure: COLONOSCOPY;  Surgeon: Ladene Artist, MD;  Location: [facility] ENDOSCOPY;  Service: Endoscopy;  Laterality: N/A;  . Left heart  catheterization with coronary angiogram N/A 10/14/2012    Procedure: LEFT HEART CATHETERIZATION WITH CORONARY ANGIOGRAM;  Surgeon: Wellington Hampshire, MD;  Location: [place] CATH LAB;  Service: Cardiovascular;  Laterality: N/A;  . Percutaneous coronary stent intervention (pci-s) Right 10/14/2012    Procedure: PERCUTANEOUS CORONARY STENT INTERVENTION (PCI-S);  Surgeon: Wellington Hampshire, MD;  Location: [facility] CATH LAB;  Service: Cardiovascular;  Laterality: Right;    There were no vitals filed for this visit.  Visit Diagnosis:  Abnormality of gait  Unsteadiness  Weakness generalized      Subjective Assessment - 11/29/14 1155    Subjective "I went off and forgot my cane again today. I didn't use it yesterday either. I walked up a big hill without it." Pt denies falls. Later, pt asked, "Can you help me figure out how to get up off my floor better? I mop the floor then have the hardest time getting back up."    Pertinent History abnormal brain MRI (Per MD note, cervical spine lesions are "likely malignant" but no sypmtoms),  A-fib, degenerative disc disease, CHF, cardiomyopathy, anemia, HTN   Limitations House hold activities;Walking   Patient Stated Goals "Work on my right leg to make it stronger and not walk around like I'm drunk."   Currently in Pain? No/denies  [facility] Adult PT Treatment/Exercise - 11/29/14 0001    Ambulation/Gait   Ambulation/Gait Yes   Ambulation/Gait Assistance 5: Supervision;4: Min guard   Ambulation Distance (Feet) 650 Feet  350 outdoors; remainder indoors   Assistive device Straight cane;None   Gait Pattern Step-through pattern;Narrow base of support;Trunk flexed;Trendelenburg;Decreased dorsiflexion - left;Decreased stride length;Scissoring   Ambulation Surface Level;Indoor;Unlevel;Outdoor;Paved   Curb --   Curb Details (indicate cue type and reason) --   Gait Comments Cueing focused on increasing RLE abduction/ER during RLE  advancement.   Therapeutic Activites    Therapeutic Activities Other Therapeutic Activities   Other Therapeutic Activities Per pt request to discuss/simulate safe method for getting out of floor after cleaning floors., explained, demonstrated safe technique with BUE support on chair (chairs in pt's kitchen have no arms). Pt required 3 consecutive trials with effective return demonstration of technique (with mod I) during final trial.   Exercises   Exercises Other Exercises   Other Exercises  Reviewed Theraband-resisted hip abduction x20 reps with green Tband. Pt required 25% cueing for proper technique. NuStep at resistance level 3  x6 mintes with LE's only, yellow Tband proximal to B knees to increase R hip abductor strength.                PT Education - 11/29/14 1228    Education provided Yes   Education Details HEP: reviewed R hip ABD strengthening. Reiterated strong recommendation for patient to utilize single point cane for all mobility. Discussed long term goals.   Person(s) Educated Patient   Methods Explanation   Comprehension Verbalized understanding;Verbal cues required;Tactile cues required;Returned demonstration          PT Short Term Goals - 11/18/14 1810    PT SHORT TERM GOAL #1   Title Pt will perform home exercises with mod I using paper handout to indicate safe daily compliance with HEP. Target date: 11/18/14   Baseline 11/17/14/-pt independent with HEP issued up till today   Status Achieved   PT SHORT TERM GOAL #2   Title Pt will negotiate four small (4.5") steps with no rails and mod I using LRAD to enable pt to safely enter/exit home.Target date: 11/18/14   Baseline Met 8/15 without AD, as pt demonstrates increased difficulty with SPC.   Status Achieved   PT SHORT TERM GOAL #3   Title Pt will negotiate standard curb step without rail with mod I using LRAD to enable pt to enter primary entrance of home, to indicate safety with community mobility.Target date:  11/18/14   Baseline Met 8/15 without AD, as pt demonstrates increased difficulty with SPC.   Status Achieved   PT SHORT TERM GOAL #4   Title Pt will verbalize understanding of fall prevention strategies to decrease fall risk within home. Target date: 11/18/14   Baseline Achieved 8/1.   Status Achieved           PT Long Term Goals - 11/25/14 1424    PT LONG TERM GOAL #1   Title Pt will increase FGA score from 11/30 to 19/30 to indicate significant improvement in dynamic gait stability. Target date: 12/16/14   Baseline 11/23/14: pt scorted 19/30 today    Status Achieved   PT LONG TERM GOAL #2   Title Pt will independently ambulate >200' over level, indoor surfaces with effective obstacle negotiation, no overt LOB to indicate safety with household mobility.     Revised goal: Pt will ambulate >200' over level surfaces with mod I using LRAD.  Target date: 12/16/14   Baseline Goal revised 8/25.   Status Revised   PT LONG TERM GOAL #3   Title Pt will ambulate >500' over unlevel, asphalt surfaces with mod I using LRAD with no overt LOB to progress toward sfety with community mobility. Target date: 12/16/14   Baseline Goal revised 8/25 to exclude grass surfaces due to safety concerns.   Status Revised   PT LONG TERM GOAL #4   Title Pt will improve Activities-specific Balance Confidence (ABC) scale score by 10 points from baseline to indicate improved balance confidence. Target date: 12/16/14   Baseline Baseline score: 44.4%   Status On-going               Plan - 11/29/14 1249    Clinical Impression Statement Session focused on gait training, addressing pt request to address floor transfer. Pt continues to report not utilizing cane, despite recommendations from this PT to utilize [facility] with all mobility. Continue per POC.   Pt will benefit from skilled therapeutic intervention in order to improve on the following deficits Abnormal gait;Decreased balance;Decreased strength;Postural  dysfunction;Pain;Decreased coordination;Decreased knowledge of use of DME   Rehab Potential Good   PT Frequency 2x / week   PT Duration 8 weeks   PT Treatment/Interventions ADLs/Self Care Home Management;Therapeutic activities;Therapeutic exercise;Neuromuscular re-education;Balance training;Cognitive remediation;Patient/family education;Orthotic Fit/Training;Gait training;Stair training;DME Instruction;Manual techniques;Vestibular;Functional mobility training   PT Next Visit Plan Gait training: focus on progressing toward mod I with SPC over level surfaces, unlevel asphalt. D/C when all LTG's are met.   Consulted and Agree with Plan of Care Patient        Problem List Patient Active Problem List   Diagnosis Date Noted  . Acute blood loss anemia 03/12/2013  . CAD (coronary artery disease)   . Benign neoplasm of colon 02/23/2013  . Nonspecific abnormal finding in stool contents 02/23/2013  . Near syncope 02/21/2013  . Anemia 02/21/2013  . Acute on chronic renal failure 02/21/2013  . Chronic systolic congestive heart failure 02/21/2013  . GI bleed 02/21/2013  . Chronic anticoagulation 02/21/2013  . Coronary artery disease with history of myocardial infarction without history of CABG 02/21/2013  . Resting chest pain 10/27/2012  . Hypothyroidism 10/15/2012  . Atrial fibrillation 10/15/2012  . CKD (chronic kidney disease), stage III 10/15/2012  . UTI (urinary tract infection) 10/15/2012  . Dilated cardiomyopathy   . Aortic insufficiency   . PAC (premature atrial contraction)   . Chest pain 04/02/2010   Billie Ruddy, PT, DPT [facility] [address] [place], [place], [address] Phone: [phone]   Fax:  [phone] 11/29/2014, 12:54 PM

## 2014-11-29 NOTE — Patient Instructions (Signed)
Strengthening: Hip Abductor - Resisted   With GREEN band looped around both legs above knees, push RIGHT thigh outward. Then, SLOWLY return to starting position. Repeat __20__ times per set. Do __2__ sets per day.

## 2014-12-01 ENCOUNTER — Ambulatory Visit: Payer: Medicare Other | Admitting: Physical Therapy

## 2014-12-01 DIAGNOSIS — R2681 Unsteadiness on feet: Secondary | ICD-10-CM | POA: Diagnosis not present

## 2014-12-01 DIAGNOSIS — R269 Unspecified abnormalities of gait and mobility: Secondary | ICD-10-CM | POA: Diagnosis not present

## 2014-12-01 DIAGNOSIS — R531 Weakness: Secondary | ICD-10-CM

## 2014-12-01 NOTE — Therapy (Signed)
[facility] [address] [place], [place], [address] Phone: [phone]   Fax:  [phone]  Physical Therapy Treatment  Patient Details  Name: Julie Lara MRN: [id number] Date of Birth: 1920-06-12 Referring Provider:  Burnard Bunting, MD  Encounter Date: 12/01/2014      PT End of Session - 12/01/14 1429    Visit Number 13   Number of Visits 17   Date for PT Re-Evaluation 12/20/14   Authorization Type Medicare - G Codes every 10 visits   PT Start Time 1401   PT Stop Time 1430   PT Time Calculation (min) 29 min   Activity Tolerance Patient tolerated treatment well   Behavior During Therapy [facility] for tasks assessed/performed      Past Medical History  Diagnosis Date  . 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 History of thyroidectomy   . Dilated cardiomyopathy   . Aortic insufficiency     mild to moderate  . PAC (premature atrial contraction)     Chronic  . Hypothyroidism   . Idiopathic cardiomyopathy 1999    EF 40% by Echo on 10/13/12  . Goiter   . NSTEMI (non-ST elevated myocardial infarction) July 2014    95% prox RCA s/p BMS  . Atrial fibrillation   . CAD (coronary artery disease)   . Acute blood loss anemia     November 2014    Past Surgical History  Procedure Laterality Date  . Cardiac catheterization    . Thyroidectomy  2008  . Total vaginal hysterectomy      Complete  . Coronary angioplasty with stent placement      BMS-95% prox RCA  . Biopsy of sacral lesion  2010  . Esophagogastroduodenoscopy N/A 02/22/2013    Procedure: ESOPHAGOGASTRODUODENOSCOPY (EGD);  Surgeon: Milus Banister, MD;  Location: [facility];  Service: Endoscopy;  Laterality: N/A;  . Colonoscopy N/A 02/23/2013    Procedure: COLONOSCOPY;  Surgeon: Ladene Artist, MD;  Location: [facility] ENDOSCOPY;  Service: Endoscopy;  Laterality: N/A;  . Left heart catheterization with coronary angiogram N/A 10/14/2012     Procedure: LEFT HEART CATHETERIZATION WITH CORONARY ANGIOGRAM;  Surgeon: Wellington Hampshire, MD;  Location: [facility] CATH LAB;  Service: Cardiovascular;  Laterality: N/A;  . Percutaneous coronary stent intervention (pci-s) Right 10/14/2012    Procedure: PERCUTANEOUS CORONARY STENT INTERVENTION (PCI-S);  Surgeon: Wellington Hampshire, MD;  Location: [facility] CATH LAB;  Service: Cardiovascular;  Laterality: Right;    There were no vitals filed for this visit.  Visit Diagnosis:  Abnormality of gait  Unsteadiness  Weakness generalized      Subjective Assessment - 12/01/14 1404    Subjective Pt arrived to session using SPC. Denies falls; reports no pain.   Pertinent History abnormal brain MRI (Per MD note, cervical spine lesions are "likely malignant" but no sypmtoms),  A-fib, degenerative disc disease, CHF, cardiomyopathy, anemia, HTN   Limitations House hold activities;Walking   Patient Stated Goals "Work on my right leg to make it stronger and not walk around like I'm drunk."   Currently in Pain? No/denies            [facility] PT Assessment - 12/01/14 0001    Observation/Other Assessments   Other Surveys  Other Surveys   Activities of Balance Confidence Scale (ABC Scale)  83.8                     [facility] Adult PT  Treatment/Exercise - 12/01/14 0001    Ambulation/Gait   Ambulation/Gait Yes   Ambulation/Gait Assistance 6: Modified independent (Device/Increase time)   Ambulation Distance (Feet) 600 Feet   Assistive device Straight cane   Gait Pattern Step-through pattern;Narrow base of support;Trunk flexed;Decreased dorsiflexion - left;Decreased stride length   Ambulation Surface Level;Indoor;Unlevel;Outdoor;Paved                PT Education - 12/01/14 1900    Education provided Yes   Education Details Goals, progress, and D/C plan. Reiterated recommendation for pt to utilize [facility] for all functional mobility.   Person(s) Educated Patient   Methods Explanation   Comprehension  Verbalized understanding          PT Short Term Goals - 12/01/14 1426    PT SHORT TERM GOAL #1   Title Pt will perform home exercises with mod I using paper handout to indicate safe daily compliance with HEP. Target date: 11/18/14   Baseline 11/17/14/-pt independent with HEP issued up till today   Status Achieved   PT SHORT TERM GOAL #2   Title Pt will negotiate four small (4.5") steps with no rails and mod I using LRAD to enable pt to safely enter/exit home.Target date: 11/18/14   Baseline Met 8/15 without AD, as pt demonstrates increased difficulty with SPC.   Status Achieved   PT SHORT TERM GOAL #3   Title Pt will negotiate standard curb step without rail with mod I using LRAD to enable pt to enter primary entrance of home, to indicate safety with community mobility.Target date: 11/18/14   Baseline Met 8/15 without AD, as pt demonstrates increased difficulty with SPC.   Status Achieved   PT SHORT TERM GOAL #4   Title Pt will verbalize understanding of fall prevention strategies to decrease fall risk within home. Target date: 11/18/14   Baseline Achieved 8/1.   Status Achieved           PT Long Term Goals - 12/01/14 1426    PT LONG TERM GOAL #1   Title Pt will increase FGA score from 11/30 to 19/30 to indicate significant improvement in dynamic gait stability. Target date: 12/16/14   Baseline 11/23/14: pt scorted 19/30 today    Status Achieved   PT LONG TERM GOAL #2   Title Pt will independently ambulate >200' over level, indoor surfaces with effective obstacle negotiation, no overt LOB to indicate safety with household mobility.     Revised goal: Pt will ambulate >200' over level surfaces with mod I using LRAD.  Target date: 12/16/14   Baseline Achieved 8/31.   Status Achieved   PT LONG TERM GOAL #3   Title Pt will ambulate >500' over unlevel, asphalt surfaces with mod I using LRAD with no overt LOB to progress toward sfety with community mobility. Target date: 12/16/14   Baseline  Achieved 8/31.   Status Achieved   PT LONG TERM GOAL #4   Title Pt will improve Activities-specific Balance Confidence (ABC) scale score by 10 points from baseline to indicate improved balance confidence. Target date: 12/16/14   Baseline Baseline score: 44.4%     Discharge (8/31) score: 83.8%   Status Achieved               Plan - 12/01/14 1430    Clinical Impression Statement Patient has met all short and long term goals and will therefore be discharged from outpatient PT at this time. Educated pt on goals, findings, progress, and D/C plan. Pt verbalized  understanding and was in full agreement with discharge plan.   Pt will benefit from skilled therapeutic intervention in order to improve on the following deficits Abnormal gait;Decreased balance;Decreased strength;Postural dysfunction;Pain;Decreased coordination;Decreased knowledge of use of DME   Rehab Potential Good   PT Frequency 2x / week   PT Duration 8 weeks   PT Treatment/Interventions ADLs/Self Care Home Management;Therapeutic activities;Therapeutic exercise;Neuromuscular re-education;Balance training;Cognitive remediation;Patient/family education;Orthotic Fit/Training;Gait training;Stair training;DME Instruction;Manual techniques;Vestibular;Functional mobility training   PT Next Visit Plan N/A, as patient is discharged from outpatient PT at this time.   PT Home Exercise Plan [facility].   Consulted and Agree with Plan of Care Patient          G-Codes - 2014-12-05 1859    Functional Assessment Tool Used FGA: 19/30   Functional Limitation Mobility: Walking and moving around   Mobility: Walking and Moving Around Goal Status [phone]) At least 20 percent but less than 40 percent impaired, limited or restricted   Mobility: Walking and Moving Around Discharge Status [phone]) At least 20 percent but less than 40 percent impaired, limited or restricted      Problem List Patient Active Problem List   Diagnosis Date Noted  . Acute  blood loss anemia 03/12/2013  . CAD (coronary artery disease)   . Benign neoplasm of colon 02/23/2013  . Nonspecific abnormal finding in stool contents 02/23/2013  . Near syncope 02/21/2013  . Anemia 02/21/2013  . Acute on chronic renal failure 02/21/2013  . Chronic systolic congestive heart failure 02/21/2013  . GI bleed 02/21/2013  . Chronic anticoagulation 02/21/2013  . Coronary artery disease with history of myocardial infarction without history of CABG 02/21/2013  . Resting chest pain 10/27/2012  . Hypothyroidism 10/15/2012  . Atrial fibrillation 10/15/2012  . CKD (chronic kidney disease), stage III 10/15/2012  . UTI (urinary tract infection) 10/15/2012  . Dilated cardiomyopathy   . Aortic insufficiency   . PAC (premature atrial contraction)   . Chest pain 04/02/2010    PHYSICAL THERAPY DISCHARGE SUMMARY  Visits from Start of Care: 13  Current functional level related to goals / functional outcomes: See Short and Long term goals above.   Remaining deficits: Despite this PT's recommendation for pt to utilize standard cane for mobility at all times, pt continues to report ambulating within home without cane, holding onto furniture. Pt also reports walking around grassy yard, picking up objects from ground without grass. Pt does bring single point cane [facility]) to PT sessions and is able to ambulate with mod I using SPC during PT sessions.   Education / Equipment: Single point cane; educated on home exercise program specific to hip abductor/extensor strengthening as well as [employer] for balance.  Plan: Patient agrees to discharge.  Patient goals were met. Patient is being discharged due to meeting the stated rehab goals.  ?????          Billie Ruddy, PT, [facility] [address] [place], [place], [address] Phone: [phone]   Fax:  [phone] 12-05-2014, 7:01 PM

## 2014-12-07 ENCOUNTER — Ambulatory Visit: Payer: Medicare Other | Admitting: Physical Therapy

## 2014-12-09 ENCOUNTER — Ambulatory Visit: Payer: Medicare Other | Admitting: Physical Therapy

## 2014-12-10 DIAGNOSIS — Z961 Presence of intraocular lens: Secondary | ICD-10-CM | POA: Diagnosis not present

## 2014-12-10 DIAGNOSIS — H52203 Unspecified astigmatism, bilateral: Secondary | ICD-10-CM | POA: Diagnosis not present

## 2014-12-10 DIAGNOSIS — H02055 Trichiasis without entropian left lower eyelid: Secondary | ICD-10-CM | POA: Diagnosis not present

## 2014-12-13 ENCOUNTER — Ambulatory Visit: Payer: Medicare Other | Admitting: Physical Therapy

## 2014-12-15 ENCOUNTER — Ambulatory Visit: Payer: Medicare Other | Admitting: Physical Therapy

## 2015-02-24 ENCOUNTER — Other Ambulatory Visit: Payer: Self-pay | Admitting: Cardiology

## 2015-02-28 ENCOUNTER — Other Ambulatory Visit: Payer: Self-pay | Admitting: Dermatology

## 2015-02-28 ENCOUNTER — Other Ambulatory Visit: Payer: Self-pay | Admitting: *Deleted

## 2015-02-28 DIAGNOSIS — C44311 Basal cell carcinoma of skin of nose: Secondary | ICD-10-CM | POA: Diagnosis not present

## 2015-02-28 DIAGNOSIS — D0461 Carcinoma in situ of skin of right upper limb, including shoulder: Secondary | ICD-10-CM | POA: Diagnosis not present

## 2015-02-28 DIAGNOSIS — L570 Actinic keratosis: Secondary | ICD-10-CM | POA: Diagnosis not present

## 2015-02-28 DIAGNOSIS — C44619 Basal cell carcinoma of skin of left upper limb, including shoulder: Secondary | ICD-10-CM | POA: Diagnosis not present

## 2015-02-28 MED ORDER — ATORVASTATIN CALCIUM 40 MG PO TABS: 40.0000 mg | ORAL_TABLET | Freq: Every day | ORAL | Status: DC

## 2015-04-07 DIAGNOSIS — C44311 Basal cell carcinoma of skin of nose: Secondary | ICD-10-CM | POA: Diagnosis not present

## 2015-04-15 DIAGNOSIS — Z029 Encounter for administrative examinations, unspecified: Secondary | ICD-10-CM | POA: Diagnosis not present

## 2015-05-25 ENCOUNTER — Other Ambulatory Visit: Payer: Self-pay | Admitting: Cardiology

## 2015-05-25 NOTE — Telephone Encounter (Signed)
Rx(s) sent to pharmacy electronically.  

## 2015-05-26 DIAGNOSIS — Z6825 Body mass index (BMI) 25.0-25.9, adult: Secondary | ICD-10-CM | POA: Diagnosis not present

## 2015-05-26 DIAGNOSIS — I5022 Chronic systolic (congestive) heart failure: Secondary | ICD-10-CM | POA: Diagnosis not present

## 2015-05-26 DIAGNOSIS — C44619 Basal cell carcinoma of skin of left upper limb, including shoulder: Secondary | ICD-10-CM | POA: Diagnosis not present

## 2015-05-26 DIAGNOSIS — I129 Hypertensive chronic kidney disease with stage 1 through stage 4 chronic kidney disease, or unspecified chronic kidney disease: Secondary | ICD-10-CM | POA: Diagnosis not present

## 2015-05-26 DIAGNOSIS — E038 Other specified hypothyroidism: Secondary | ICD-10-CM | POA: Diagnosis not present

## 2015-05-26 DIAGNOSIS — N183 Chronic kidney disease, stage 3 (moderate): Secondary | ICD-10-CM | POA: Diagnosis not present

## 2015-05-26 DIAGNOSIS — I10 Essential (primary) hypertension: Secondary | ICD-10-CM | POA: Diagnosis not present

## 2015-05-26 DIAGNOSIS — C801 Malignant (primary) neoplasm, unspecified: Secondary | ICD-10-CM | POA: Diagnosis not present

## 2015-05-26 DIAGNOSIS — D0460 Carcinoma in situ of skin of unspecified upper limb, including shoulder: Secondary | ICD-10-CM | POA: Diagnosis not present

## 2015-05-26 DIAGNOSIS — Z1389 Encounter for screening for other disorder: Secondary | ICD-10-CM | POA: Diagnosis not present

## 2015-05-26 DIAGNOSIS — E784 Other hyperlipidemia: Secondary | ICD-10-CM | POA: Diagnosis not present

## 2015-07-11 ENCOUNTER — Ambulatory Visit (INDEPENDENT_AMBULATORY_CARE_PROVIDER_SITE_OTHER): Payer: Medicare Other | Admitting: Cardiology

## 2015-07-11 ENCOUNTER — Encounter: Payer: Self-pay | Admitting: Cardiology

## 2015-07-11 VITALS — BP 100/64 | HR 68 | Ht 64.0 in | Wt 137.4 lb

## 2015-07-11 DIAGNOSIS — I2510 Atherosclerotic heart disease of native coronary artery without angina pectoris: Secondary | ICD-10-CM

## 2015-07-11 DIAGNOSIS — I480 Paroxysmal atrial fibrillation: Secondary | ICD-10-CM

## 2015-07-11 DIAGNOSIS — I5022 Chronic systolic (congestive) heart failure: Secondary | ICD-10-CM

## 2015-07-11 DIAGNOSIS — I491 Atrial premature depolarization: Secondary | ICD-10-CM | POA: Diagnosis not present

## 2015-07-11 NOTE — Progress Notes (Signed)
Julie Lara Date of Birth: Jul 17, 1920 Medical Record [id number]  History of Present Illness: Julie Lara is seen back for a follow up visit. She is a 82 year old female with a history of CAD, PAF, dilated CM, AI, PACs, hypothyroidism, CKD stage III and prior UTIs.  Admitted to the hospital with atrial fib with RVR in July 2014. Was placed on Eliquis. Converted to NSR with PACx. Troponins slightly up and so she was cathed and had BMS to the proximal RCA. EF per echo was 40%, moderate LV dilatation, mild LVH, mild MR, mild biatrial enlargement and changes questionable for IVC thrombus. She was continued on Plavix for one month, then this was discontinued.  In November 2014 she was admitted with profound anemia Hgb 6.6 and heme positive stool. Upper and lower endoscopy were negative. Capsule videoendoscopy was also negative. It was decided not to resume anticoagulation.   On follow up today she is doing well. Denies any chest pain, dyspnea, palpitations. No dizziness. She is no longer driving. She does have some chronic gait weakness that she relates to falling on ice several years ago.    Current Outpatient Prescriptions  Medication Sig Dispense Refill  . atorvastatin (LIPITOR) 40 MG tablet Take 1 tablet (40 mg total) by mouth daily at 6 PM. 90 tablet 1  . Cholecalciferol (VITAMIN D PO) Take 1,000 Units by mouth daily.     . iron polysaccharides (FERREX 150) 150 MG capsule Take 150 mg by mouth daily.     Marland Kitchen levothyroxine (SYNTHROID, LEVOTHROID) 25 MCG tablet Take 25 mcg by mouth daily before breakfast.    . metoprolol tartrate (LOPRESSOR) 25 MG tablet Take 0.5 tablets (12.5 mg total) by mouth 2 (two) times daily. 90 tablet 2  . nitroGLYCERIN (NITROSTAT) 0.4 MG SL tablet Place 1 tablet (0.4 mg total) under the tongue every 5 (five) minutes as needed for chest pain. 25 tablet 3  . pantoprazole (PROTONIX) 40 MG tablet Take 1 tablet (40 mg total) by mouth 2 (two) times daily before a meal. 60  tablet 5   No current facility-administered medications for this visit.    No Known Allergies  Past Medical History  Diagnosis Date  . 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 History of thyroidectomy   . Dilated cardiomyopathy ([place])   . Aortic insufficiency     mild to moderate  . PAC (premature atrial contraction)     Chronic  . Hypothyroidism   . Idiopathic cardiomyopathy ([place]) 1999    EF 40% by Echo on 10/13/12  . Goiter   . NSTEMI (non-ST elevated myocardial infarction) [facility]) July 2014    95% prox RCA s/p BMS  . Atrial fibrillation ([place])   . CAD (coronary artery disease)   . Acute blood loss anemia     November 2014    Past Surgical History  Procedure Laterality Date  . Cardiac catheterization    . Thyroidectomy  2008  . Total vaginal hysterectomy      Complete  . Coronary angioplasty with stent placement      BMS-95% prox RCA  . Biopsy of sacral lesion  2010  . Esophagogastroduodenoscopy N/A 02/22/2013    Procedure: ESOPHAGOGASTRODUODENOSCOPY (EGD);  Surgeon: Milus Banister, MD;  Location: [facility];  Service: Endoscopy;  Laterality: N/A;  . Colonoscopy N/A 02/23/2013    Procedure: COLONOSCOPY;  Surgeon: Ladene Artist, MD;  Location: [facility] ENDOSCOPY;  Service: Endoscopy;  Laterality: N/A;  .  Left heart catheterization with coronary angiogram N/A 10/14/2012    Procedure: LEFT HEART CATHETERIZATION WITH CORONARY ANGIOGRAM;  Surgeon: Wellington Hampshire, MD;  Location: [facility] CATH LAB;  Service: Cardiovascular;  Laterality: N/A;  . Percutaneous coronary stent intervention (pci-s) Right 10/14/2012    Procedure: PERCUTANEOUS CORONARY STENT INTERVENTION (PCI-S);  Surgeon: Wellington Hampshire, MD;  Location: [facility] CATH LAB;  Service: Cardiovascular;  Laterality: Right;    History  Smoking status  . Never Smoker   Smokeless tobacco  . Never Used    History  Alcohol Use No    Family History  Problem Relation Age of Onset  . Diabetes Sister      Review of Systems: The review of systems is per the HPI.  All other systems were reviewed and are negative.  Physical Exam: BP 100/64 mmHg  Pulse 68  Ht 5\' 4"  (1.626 m)  Wt 62.324 kg (137 lb 6.4 oz)  BMI 23.57 kg/m2 Patient is very pleasant and in no acute distress.  Skin is warm and dry. Color is normal.  HEENT is unremarkable. Normocephalic/atraumatic. PERRL. Sclera are nonicteric. Neck is supple. No masses. No JVD. Lungs are clear. Cardiac exam shows an irregular rhythm. Rate is controlled. Abdomen is soft. Extremities full but are without edema. Gait and ROM are intact. No gross neurologic deficits noted.  LABORATORY DATA:  Ecg today shows NSR with PACs. LVH, T wave abnormality c/w inferolateral ischemia. I have personally reviewed and interpreted this study.    Assessment / Plan:  1. CAD - s/p NSTEMI in July 2014- s/p cath with PCI with BMS to the RCA - EF of 40% per her echo - she continues to do well. On no antiplatelet therapy due to history of GI bleed.   2. Atrial fib -  only episode was in July 2014 at time of hospitalization.  No evidence of recurrence since then. Given history of major GI bleed will leave off anticoagulation  3. Hypothyroidism   4. HLD -  on statin therapy. Labs followed by Dr. Reynaldo Minium.  5. LV dysfunction - EF was 40% by echo - currently looks well compensated. Would favor continued medical management.   6. GI bleed. Source unclear.  No clear recurrence.  I will follow up in 6 months.

## 2015-07-11 NOTE — Patient Instructions (Signed)
Continue your current therapy  I will see you in 6 months.   

## 2015-08-10 DIAGNOSIS — L570 Actinic keratosis: Secondary | ICD-10-CM | POA: Diagnosis not present

## 2015-08-10 DIAGNOSIS — L821 Other seborrheic keratosis: Secondary | ICD-10-CM | POA: Diagnosis not present

## 2015-08-17 ENCOUNTER — Encounter: Payer: Self-pay | Admitting: Podiatry

## 2015-08-17 ENCOUNTER — Ambulatory Visit (INDEPENDENT_AMBULATORY_CARE_PROVIDER_SITE_OTHER): Payer: Medicare Other

## 2015-08-17 ENCOUNTER — Ambulatory Visit (INDEPENDENT_AMBULATORY_CARE_PROVIDER_SITE_OTHER): Payer: Medicare Other | Admitting: Podiatry

## 2015-08-17 VITALS — BP 124/74 | HR 67 | Resp 12

## 2015-08-17 DIAGNOSIS — R609 Edema, unspecified: Secondary | ICD-10-CM | POA: Diagnosis not present

## 2015-08-17 DIAGNOSIS — R52 Pain, unspecified: Secondary | ICD-10-CM

## 2015-08-17 DIAGNOSIS — I2510 Atherosclerotic heart disease of native coronary artery without angina pectoris: Secondary | ICD-10-CM | POA: Diagnosis not present

## 2015-08-17 NOTE — Progress Notes (Signed)
   Subjective:    Patient ID: Julie Lara, female    DOB: [birth year], 82 y.o.   MRN: [id number]  HPI   This patient presents today with her daughter present in the treatment room complaining approximately 4 day history of a sudden swelling and redness in the lateral aspect of the left ankle. She denies any pain in the swollen lateral left ankle area. Patient denies any direct injury to the area and has been self treating the area with Epsom salt soaks and applying Vaseline. She describes a generalized swelling and redness in her ankles and feet for approximately 2 years. Without any significant increase in the swelling and redness with the exception of the lateral aspect of the left ankle.  Review of Systems  Genitourinary: Positive for frequency.  Skin: Positive for color change.  Hematological: Bruises/bleeds easily.       Objective:   Physical Exam  Patient appears orientated 3  Vascular: There is no calf edema or calf tenderness bilaterally Nonpitting edema lateral right ankle DP pulses 1/4 right 2/4 left PT pulses 2/4 bilaterally Capillary reflex immediate bilaterally  Neurological: Sensation to 10 g monofilament wire intact 5/5 bilaterally Vibratory sensation intact bilaterally Ankle reflex equal and reactive bilaterally  Dermatological: No open skin lesions bilaterally Dry skin bilaterally Generalized redness in the dorsal forefoot area bilaterally Lateral aspect of the right ankle has an erythematous area with a proximal area of circular target skin The toenails are hypertrophic, deformed, discolored 6-10  Musculoskeletal: There is no crepitus or pain on range of motion the right ankle With direct palpation of lateral anterior right distal ankle there is slight tenderness without any palpable lesions  Symmetrical range of motion ankles dorsiflexion/plantar flexion bilaterally  X-ray weightbearing left ankle dated 08/17/2015  Increased soft tissue  density Decreased bone density noted throughout all views Ankle mortise appears adequate Distal fibula has cystic area with cortical disruption distally Inferior calcaneal spur Hammertoe second  Radiographic impression: Cortical disruption distal fibula correlate with clinical findings        Assessment & Plan:   Assessment: Satisfactory neurovascular status Chronic tinea pedis bilaterally Mycotic toenails 6-10 Area of tinea pedis proximal to the area of erythema on the distal left ankle X-ray demonstrates cortical disruption which may or may not directly correlate with the local edema in the area Rule out inflammatory origin of edema  Plan: Today I reviewed the results of x-ray examination with patient and patient's daughter. I told him I was unsure as to what was causing the ankle swelling. I referred patient to lab for an arthritis panel and will notify them of the results when they return the next 7 days. Patient advised to restrict standing walking and observe ankle and presented if she notices any sudden increase in swelling redness or pain in the area Patient wanted some treatment for the generalized tinea in her feet and I suggested over-the-counter clotrimazole 1% cream to apply twice a day to the skin on feet 30 days  Reappoint 7 days

## 2015-08-17 NOTE — Patient Instructions (Signed)
Today we are trying to determine the origin of the sudden swelling and redness on the outside of your right ankle. The x-ray demonstrated a small area of irregular bony area that may be associated with the redness, however I am unsure of this at this time. I'm referring you the lab for generalized arthritis panel you do not have to be fasting Also, you were concerned about the generalized skin fungal infection. Over-the-counter clotrimazole 1% cream apply twice daily to the skin on feet 30 days

## 2015-08-18 LAB — C-REACTIVE PROTEIN: CRP: <0.5 mg/dL (ref <0.60)

## 2015-08-18 LAB — RHEUMATOID FACTOR

## 2015-08-18 LAB — URIC ACID: Uric Acid, Serum: 5.5 mg/dL (ref 2.4–7.0)

## 2015-08-19 LAB — ANA: Anti Nuclear Antibody(ANA): NEGATIVE

## 2015-08-19 LAB — SEDIMENTATION RATE: Sed Rate: 10 mm/hr (ref 0–30)

## 2015-08-23 ENCOUNTER — Encounter: Payer: Self-pay | Admitting: Podiatry

## 2015-08-23 ENCOUNTER — Ambulatory Visit (INDEPENDENT_AMBULATORY_CARE_PROVIDER_SITE_OTHER): Payer: Medicare Other | Admitting: Podiatry

## 2015-08-23 VITALS — BP 124/89 | HR 69 | Resp 12

## 2015-08-23 DIAGNOSIS — I2510 Atherosclerotic heart disease of native coronary artery without angina pectoris: Secondary | ICD-10-CM

## 2015-08-23 DIAGNOSIS — B353 Tinea pedis: Secondary | ICD-10-CM

## 2015-08-23 NOTE — Patient Instructions (Signed)
The swelling and redness in the right outer ankle are improving. The skin rash over the right ankle most likely is an athlete slight foot infection. Continue to apply the clotrimazole 1% cream once daily for an additional 3 weeks to the outer ankle and outer foot right. The results of your lab, arthritis panel were all within normal limits Reappoint 3 weeks to take a progress x-ray

## 2015-08-23 NOTE — Progress Notes (Signed)
Patient ID: Julie Lara, female   DOB: 1920-10-23, 82 y.o.   MRN: [id number]   Subjective: This patient presents from the follow-up visit of 08/17/2015. She currently wearing her standing walking and applying Chlortrimazole 1% cream to the skin lesion lateral right ankle. At this time the redness and swelling have improved from the initial visit. Also, patient had a arthritis panel ordered on the visit of 517 in the results are available today This patient presents on 08/17/2015 with her daughter present in the treatment room complaining approximately 4 day history of a sudden swelling and redness in the lateral aspect of the left ankle. She denies any pain in the swollen lateral left ankle area. Patient denies any direct injury to the area and has been self treating the area with Epsom salt soaks and applying Vaseline. She describes a generalized swelling and redness in her ankles and feet for approximately 2 years. Without any significant increase in the swelling and redness with the exception of the lateral aspect of the left ankle.  Patient appears orientated 3  Vascular: There is no calf edema or calf tenderness bilaterally Nonpitting edema lateral right ankle DP pulses 1/4 right 2/4 left PT pulses 2/4 bilaterally Capillary reflex immediate bilaterally  Neurological: Sensation to 10 g monofilament wire intact 5/5 bilaterally Vibratory sensation intact bilaterally Ankle reflex equal and reactive bilaterally  Dermatological: No open skin lesions bilaterally Dry skin bilaterally Generalized redness in the dorsal forefoot area bilaterally with decreased swelling and redness on the lateral right ankle Lateral aspect of the right ankle has an erythematous area with a proximal area of circular target skin there is no warmth or active drainage noted in lateral right ankle The toenails are hypertrophic, deformed, discolored 6-10  Musculoskeletal: There is no crepitus or pain on range of  motion the right ankle With direct palpation of lateral anterior right distal ankle there is slight tenderness without any palpable lesions  Symmetrical range of motion ankles dorsiflexion/plantar flexion bilaterally  X-ray weightbearing left ankle dated 08/17/2015  Increased soft tissue density Decreased bone density noted throughout all views Ankle mortise appears adequate Distal fibula has cystic area with cortical disruption distally Inferior calcaneal spur Hammertoe second  Radiographic impression: Cortical disruption distal fibula correlate with clinical findings  Arthritis panel dated 08/17/2015 Rheumatoid factor within normal limits Anti-nuclear antibody negative Sedimentation rate within normal limits CRP within normal limits  Assessment: Reducing erythema lateral right ankle most likely associated with tinea  Plan: As the erythema and edema reducing with relative rest with application of Chlortrimazole 1% cream to the area I advised patient to continue apply the clotrimazole cream daily additional 3 weeks  On next visit would like to get a progress x-ray on the right ankle as there was an area of cortical disruption on the lateral distal fibula which may or may not be associated with the resolving erythema and edema in the lateral right ankle  Reappoint 3 weeks for exam and progress x-ray of right ankle

## 2015-09-01 DIAGNOSIS — R930 Abnormal findings on diagnostic imaging of skull and head, not elsewhere classified: Secondary | ICD-10-CM | POA: Diagnosis not present

## 2015-09-01 DIAGNOSIS — R208 Other disturbances of skin sensation: Secondary | ICD-10-CM | POA: Diagnosis not present

## 2015-09-12 ENCOUNTER — Other Ambulatory Visit: Payer: Self-pay | Admitting: Cardiology

## 2015-09-12 ENCOUNTER — Other Ambulatory Visit: Payer: Self-pay | Admitting: *Deleted

## 2015-09-12 MED ORDER — ATORVASTATIN CALCIUM 40 MG PO TABS: 40.0000 mg | ORAL_TABLET | Freq: Every day | ORAL | Status: DC

## 2015-09-13 ENCOUNTER — Ambulatory Visit (INDEPENDENT_AMBULATORY_CARE_PROVIDER_SITE_OTHER): Payer: Medicare Other | Admitting: Podiatry

## 2015-09-13 ENCOUNTER — Ambulatory Visit (INDEPENDENT_AMBULATORY_CARE_PROVIDER_SITE_OTHER): Payer: Medicare Other

## 2015-09-13 ENCOUNTER — Other Ambulatory Visit: Payer: Self-pay | Admitting: Podiatry

## 2015-09-13 ENCOUNTER — Encounter: Payer: Self-pay | Admitting: Podiatry

## 2015-09-13 VITALS — BP 145/92 | HR 60 | Resp 12

## 2015-09-13 DIAGNOSIS — B353 Tinea pedis: Secondary | ICD-10-CM

## 2015-09-13 DIAGNOSIS — R52 Pain, unspecified: Secondary | ICD-10-CM | POA: Diagnosis not present

## 2015-09-13 DIAGNOSIS — R609 Edema, unspecified: Secondary | ICD-10-CM

## 2015-09-13 DIAGNOSIS — M25473 Effusion, unspecified ankle: Secondary | ICD-10-CM

## 2015-09-13 DIAGNOSIS — I2510 Atherosclerotic heart disease of native coronary artery without angina pectoris: Secondary | ICD-10-CM

## 2015-09-13 NOTE — Progress Notes (Signed)
   Subjective:    Patient ID: Julie Lara, female    DOB: 12-Jun-1920, 82 y.o.   MRN: [id number]  HPI This patient presents for the follow-up visit of 08/23/2015. Patient had history of some swelling redness on the lateral right ankle at that visit which seems to be improving. Patient is been applying Clortrimazole 1% cream and skin lotion to the area ankle still is some redness without any pain.   Review of Systems  Cardiovascular: Positive for leg swelling.  Musculoskeletal: Positive for gait problem.  Skin: Positive for color change.       Objective:   Physical Exam  Patient appears orientated 3  Vascular: There is no calf edema or calf tenderness bilaterally Nonpitting edema lateral right ankle DP pulses 1/4 right 2/4 left PT pulses 2/4 bilaterally Capillary reflex immediate bilaterally  Neurological: Sensation to 10 g monofilament wire intact 5/5 bilaterally Vibratory sensation intact bilaterally Ankle reflex equal and reactive bilaterally  Dermatological: No open skin lesions bilaterally Dry skin bilaterally Generalized redness in the dorsal forefoot area bilaterally with decreased swelling and redness on the lateral right ankle Lateral aspect of the right ankle has an erythematous area with a proximal area of circular target skin there is no warmth or active drainage noted in lateral right ankle The toenails are hypertrophic, deformed, discolored 6-10  Musculoskeletal: There is no crepitus or pain on range of motion the right ankle With direct palpation of lateral anterior right distal ankle there is slight tenderness without any palpable lesions  Symmetrical range of motion ankles dorsiflexion/plantar flexion bilaterally  X-ray examination weightbearing right ankle dated 09/13/2015  Intact bony structures without fracture and/or dislocation Ankle mortise appears adequate Distal fibula has cystic area Inferior calcaneal spur No emphysema noted Generalized  decreased bone density noted in all views  Radiographic impression: No acute bony abnormality noted in the right ankle    Arthritis panel dated 08/17/2015 Rheumatoid factor within normal limits Anti-nuclear antibody negative Sedimentation rate within normal limits CRP within normal limits Assessment & Plan:   Assessment: Edema in erythema lateral right ankle associated with local xerosis and tinea No obvious underlying bony pathology  Plan: Reviewed the results of x-ray the right ankle and exam with patient today Patient will continue to apply topical antifungal cream and skin moisturizing to the lateral foot ankle area until skin clears  Reappoint at patient's request

## 2015-09-13 NOTE — Patient Instructions (Signed)
Today your right ankle x-ray was within normal limits Still has low-grade swelling on the outside of the right ankle. Continue to apply clotrimazole 1% cream mixed with a soft skin moisturizing cream and ongoing daily basis  Reappoint at your request

## 2015-11-28 DIAGNOSIS — R358 Other polyuria: Secondary | ICD-10-CM | POA: Diagnosis not present

## 2015-11-28 DIAGNOSIS — I129 Hypertensive chronic kidney disease with stage 1 through stage 4 chronic kidney disease, or unspecified chronic kidney disease: Secondary | ICD-10-CM | POA: Diagnosis not present

## 2015-11-28 DIAGNOSIS — E038 Other specified hypothyroidism: Secondary | ICD-10-CM | POA: Diagnosis not present

## 2015-11-28 DIAGNOSIS — R8290 Unspecified abnormal findings in urine: Secondary | ICD-10-CM | POA: Diagnosis not present

## 2015-11-28 DIAGNOSIS — E559 Vitamin D deficiency, unspecified: Secondary | ICD-10-CM | POA: Diagnosis not present

## 2015-11-28 DIAGNOSIS — I10 Essential (primary) hypertension: Secondary | ICD-10-CM | POA: Diagnosis not present

## 2015-12-16 DIAGNOSIS — Z6824 Body mass index (BMI) 24.0-24.9, adult: Secondary | ICD-10-CM | POA: Diagnosis not present

## 2015-12-16 DIAGNOSIS — I4891 Unspecified atrial fibrillation: Secondary | ICD-10-CM | POA: Diagnosis not present

## 2015-12-16 DIAGNOSIS — Z0000 Encounter for general adult medical examination without abnormal findings: Secondary | ICD-10-CM | POA: Diagnosis not present

## 2015-12-16 DIAGNOSIS — I420 Dilated cardiomyopathy: Secondary | ICD-10-CM | POA: Diagnosis not present

## 2015-12-16 DIAGNOSIS — E784 Other hyperlipidemia: Secondary | ICD-10-CM | POA: Diagnosis not present

## 2015-12-16 DIAGNOSIS — R930 Abnormal findings on diagnostic imaging of skull and head, not elsewhere classified: Secondary | ICD-10-CM | POA: Diagnosis not present

## 2015-12-16 DIAGNOSIS — I129 Hypertensive chronic kidney disease with stage 1 through stage 4 chronic kidney disease, or unspecified chronic kidney disease: Secondary | ICD-10-CM | POA: Diagnosis not present

## 2015-12-16 DIAGNOSIS — I5022 Chronic systolic (congestive) heart failure: Secondary | ICD-10-CM | POA: Diagnosis not present

## 2015-12-16 DIAGNOSIS — D6489 Other specified anemias: Secondary | ICD-10-CM | POA: Diagnosis not present

## 2015-12-16 DIAGNOSIS — R410 Disorientation, unspecified: Secondary | ICD-10-CM | POA: Diagnosis not present

## 2015-12-16 DIAGNOSIS — N183 Chronic kidney disease, stage 3 (moderate): Secondary | ICD-10-CM | POA: Diagnosis not present

## 2015-12-16 DIAGNOSIS — J302 Other seasonal allergic rhinitis: Secondary | ICD-10-CM | POA: Diagnosis not present

## 2015-12-20 DIAGNOSIS — H26492 Other secondary cataract, left eye: Secondary | ICD-10-CM | POA: Diagnosis not present

## 2015-12-20 DIAGNOSIS — H02055 Trichiasis without entropian left lower eyelid: Secondary | ICD-10-CM | POA: Diagnosis not present

## 2015-12-20 DIAGNOSIS — H52203 Unspecified astigmatism, bilateral: Secondary | ICD-10-CM | POA: Diagnosis not present

## 2015-12-20 DIAGNOSIS — H02052 Trichiasis without entropian right lower eyelid: Secondary | ICD-10-CM | POA: Diagnosis not present

## 2016-01-26 DIAGNOSIS — H26492 Other secondary cataract, left eye: Secondary | ICD-10-CM | POA: Diagnosis not present

## 2016-03-09 ENCOUNTER — Other Ambulatory Visit: Payer: Self-pay | Admitting: Cardiology

## 2016-05-07 DIAGNOSIS — D229 Melanocytic nevi, unspecified: Secondary | ICD-10-CM | POA: Diagnosis not present

## 2016-05-07 DIAGNOSIS — Z85828 Personal history of other malignant neoplasm of skin: Secondary | ICD-10-CM | POA: Diagnosis not present

## 2016-05-07 DIAGNOSIS — L821 Other seborrheic keratosis: Secondary | ICD-10-CM | POA: Diagnosis not present

## 2016-05-07 DIAGNOSIS — L570 Actinic keratosis: Secondary | ICD-10-CM | POA: Diagnosis not present

## 2016-05-07 DIAGNOSIS — L814 Other melanin hyperpigmentation: Secondary | ICD-10-CM | POA: Diagnosis not present

## 2016-05-28 ENCOUNTER — Other Ambulatory Visit: Payer: Self-pay | Admitting: Cardiology

## 2016-06-21 DIAGNOSIS — E038 Other specified hypothyroidism: Secondary | ICD-10-CM | POA: Diagnosis not present

## 2016-06-21 DIAGNOSIS — Z6825 Body mass index (BMI) 25.0-25.9, adult: Secondary | ICD-10-CM | POA: Diagnosis not present

## 2016-06-21 DIAGNOSIS — I10 Essential (primary) hypertension: Secondary | ICD-10-CM | POA: Diagnosis not present

## 2016-06-21 DIAGNOSIS — I4891 Unspecified atrial fibrillation: Secondary | ICD-10-CM | POA: Diagnosis not present

## 2016-06-21 DIAGNOSIS — E559 Vitamin D deficiency, unspecified: Secondary | ICD-10-CM | POA: Diagnosis not present

## 2016-06-21 DIAGNOSIS — I5022 Chronic systolic (congestive) heart failure: Secondary | ICD-10-CM | POA: Diagnosis not present

## 2016-06-21 DIAGNOSIS — I129 Hypertensive chronic kidney disease with stage 1 through stage 4 chronic kidney disease, or unspecified chronic kidney disease: Secondary | ICD-10-CM | POA: Diagnosis not present

## 2016-06-21 DIAGNOSIS — R410 Disorientation, unspecified: Secondary | ICD-10-CM | POA: Diagnosis not present

## 2016-06-21 DIAGNOSIS — Z1389 Encounter for screening for other disorder: Secondary | ICD-10-CM | POA: Diagnosis not present

## 2016-06-21 DIAGNOSIS — N183 Chronic kidney disease, stage 3 (moderate): Secondary | ICD-10-CM | POA: Diagnosis not present

## 2016-06-21 DIAGNOSIS — I420 Dilated cardiomyopathy: Secondary | ICD-10-CM | POA: Diagnosis not present

## 2016-06-21 DIAGNOSIS — E784 Other hyperlipidemia: Secondary | ICD-10-CM | POA: Diagnosis not present

## 2016-06-24 ENCOUNTER — Other Ambulatory Visit: Payer: Self-pay | Admitting: Cardiology

## 2016-07-17 ENCOUNTER — Encounter (HOSPITAL_COMMUNITY): Payer: Self-pay | Admitting: Emergency Medicine

## 2016-07-17 ENCOUNTER — Observation Stay (HOSPITAL_COMMUNITY)
Admission: EM | Admit: 2016-07-17 | Discharge: 2016-07-19 | Disposition: A | Discharge status: Home / Self Care | Payer: Medicare Other | Attending: Internal Medicine | Admitting: Internal Medicine

## 2016-07-17 ENCOUNTER — Emergency Department (HOSPITAL_COMMUNITY): Payer: Medicare Other

## 2016-07-17 DIAGNOSIS — Z955 Presence of coronary angioplasty implant and graft: Secondary | ICD-10-CM | POA: Diagnosis not present

## 2016-07-17 DIAGNOSIS — I480 Paroxysmal atrial fibrillation: Secondary | ICD-10-CM | POA: Diagnosis not present

## 2016-07-17 DIAGNOSIS — I5022 Chronic systolic (congestive) heart failure: Secondary | ICD-10-CM | POA: Diagnosis not present

## 2016-07-17 DIAGNOSIS — E785 Hyperlipidemia, unspecified: Secondary | ICD-10-CM | POA: Diagnosis not present

## 2016-07-17 DIAGNOSIS — I252 Old myocardial infarction: Secondary | ICD-10-CM | POA: Diagnosis not present

## 2016-07-17 DIAGNOSIS — R079 Chest pain, unspecified: Secondary | ICD-10-CM | POA: Diagnosis present

## 2016-07-17 DIAGNOSIS — E039 Hypothyroidism, unspecified: Secondary | ICD-10-CM | POA: Insufficient documentation

## 2016-07-17 DIAGNOSIS — I420 Dilated cardiomyopathy: Secondary | ICD-10-CM | POA: Insufficient documentation

## 2016-07-17 DIAGNOSIS — I259 Chronic ischemic heart disease, unspecified: Secondary | ICD-10-CM

## 2016-07-17 DIAGNOSIS — Z7982 Long term (current) use of aspirin: Secondary | ICD-10-CM | POA: Diagnosis not present

## 2016-07-17 DIAGNOSIS — R0789 Other chest pain: Secondary | ICD-10-CM | POA: Diagnosis not present

## 2016-07-17 DIAGNOSIS — N183 Chronic kidney disease, stage 3 (moderate): Secondary | ICD-10-CM | POA: Insufficient documentation

## 2016-07-17 DIAGNOSIS — I2510 Atherosclerotic heart disease of native coronary artery without angina pectoris: Secondary | ICD-10-CM | POA: Insufficient documentation

## 2016-07-17 DIAGNOSIS — I517 Cardiomegaly: Secondary | ICD-10-CM | POA: Diagnosis not present

## 2016-07-17 DIAGNOSIS — I130 Hypertensive heart and chronic kidney disease with heart failure and stage 1 through stage 4 chronic kidney disease, or unspecified chronic kidney disease: Secondary | ICD-10-CM | POA: Diagnosis not present

## 2016-07-17 DIAGNOSIS — Z79899 Other long term (current) drug therapy: Secondary | ICD-10-CM | POA: Insufficient documentation

## 2016-07-17 LAB — BASIC METABOLIC PANEL
Anion gap: 10 (ref 5–15)
BUN: 28 mg/dL — AB (ref 6–20)
CALCIUM: 9.3 mg/dL (ref 8.9–10.3)
CO2: 23 mmol/L (ref 22–32)
CREATININE: 1.24 mg/dL — AB (ref 0.44–1.00)
Chloride: 105 mmol/L (ref 101–111)
GFR calc Af Amer: 42 mL/min — ABNORMAL LOW (ref >60)
GFR, EST NON AFRICAN AMERICAN: 36 mL/min — AB (ref >60)
Glucose, Bld: 122 mg/dL — ABNORMAL HIGH (ref 65–99)
POTASSIUM: 4.1 mmol/L (ref 3.5–5.1)
SODIUM: 138 mmol/L (ref 135–145)

## 2016-07-17 LAB — CBC
HEMATOCRIT: 37.7 % (ref 36.0–46.0)
Hemoglobin: 12.4 g/dL (ref 12.0–15.0)
MCH: 29.5 pg (ref 26.0–34.0)
MCHC: 32.9 g/dL (ref 30.0–36.0)
MCV: 89.8 fL (ref 78.0–100.0)
PLATELETS: 161 10*3/uL (ref 150–400)
RBC: 4.2 MIL/uL (ref 3.87–5.11)
RDW: 13.4 % (ref 11.5–15.5)
WBC: 4.8 10*3/uL (ref 4.0–10.5)

## 2016-07-17 LAB — I-STAT TROPONIN, ED: Troponin i, poc: 0.01 ng/mL (ref 0.00–0.08)

## 2016-07-17 LAB — PROTIME-INR
INR: 1.03
PROTHROMBIN TIME: 13.5 s (ref 11.4–15.2)

## 2016-07-17 NOTE — ED Triage Notes (Signed)
Patient reports intermittent  left lower chest discomfort / mild palpitations onset yesterday , denies SOB , no nausea or diaphoresis , her cardiologist is Dr. Martinique , history of CAD/ Coronary stent .

## 2016-07-17 NOTE — ED Provider Notes (Signed)
[facility] DEPT Provider Note   CSN: [id number] Arrival date & time: 07/17/16  2047  By signing my name below, I, Evelene Croon, attest that this documentation has been prepared under the direction and in the presence of Everlene Balls, MD . Electronically Signed: Evelene Croon, Scribe. 07/18/2016. 12:07 AM.  History   Chief Complaint Chief Complaint  Patient presents with  . Chest Pain    The history is provided by the patient. No language interpreter was used.    HPI Comments:  Julie Lara is a 83 y.o. female with a history of AFIB, CAD, coronary stents, and PAC, who presents to the Emergency Department complaining of non-radiating, intermittent, left sided CP which began yesterday. Pt notes she was getting dressed for a funeral when her pain began. She describes a pricking sensation in her chest. Pt denies SOB, diaphoresis, and vomiting. No acute injury/fall. No alleviating factors noted. She denies recent h/o same; cannot recall what pain prior to coronary placement felt like.    Past Medical History:  Diagnosis Date  . Acute blood loss anemia    November 2014  . Aortic insufficiency    mild to moderate  . Atrial fibrillation ([place])   . CAD (coronary artery disease)   . Chest pain 04-2010   atypical with negative echocardiogram  . Dilated cardiomyopathy ([place])   . Goiter   . History of thyroidectomy   . Hypothyroidism   . Idiopathic cardiomyopathy ([place]) 1999   EF 40% by Echo on 10/13/12  . NSTEMI (non-ST elevated myocardial infarction) [facility]) July 2014   95% prox RCA s/p BMS  . PAC (premature atrial contraction)    Chronic    Patient Active Problem List   Diagnosis Date Noted  . Acute blood loss anemia 03/12/2013  . CAD (coronary artery disease)   . Benign neoplasm of colon 02/23/2013  . Nonspecific abnormal finding in stool contents 02/23/2013  . Near syncope 02/21/2013  . Anemia 02/21/2013  . Acute on chronic renal failure ([place]) 02/21/2013  . Chronic  systolic congestive heart failure ([place]) 02/21/2013  . GI bleed 02/21/2013  . Chronic anticoagulation 02/21/2013  . Coronary artery disease with history of myocardial infarction without history of CABG 02/21/2013  . Resting chest pain 10/27/2012  . Hypothyroidism 10/15/2012  . Atrial fibrillation ([place]) 10/15/2012  . CKD (chronic kidney disease), stage III 10/15/2012  . UTI (urinary tract infection) 10/15/2012  . Dilated cardiomyopathy ([place])   . Aortic insufficiency   . PAC (premature atrial contraction)   . Chest pain 04/02/2010    Past Surgical History:  Procedure Laterality Date  . biopsy of sacral lesion  2010  . CARDIAC CATHETERIZATION    . COLONOSCOPY N/A 02/23/2013   Procedure: COLONOSCOPY;  Surgeon: Ladene Artist, MD;  Location: [facility] ENDOSCOPY;  Service: Endoscopy;  Laterality: N/A;  . CORONARY ANGIOPLASTY WITH STENT PLACEMENT     BMS-95% prox RCA  . ESOPHAGOGASTRODUODENOSCOPY N/A 02/22/2013   Procedure: ESOPHAGOGASTRODUODENOSCOPY (EGD);  Surgeon: Milus Banister, MD;  Location: [facility];  Service: Endoscopy;  Laterality: N/A;  . LEFT HEART CATHETERIZATION WITH CORONARY ANGIOGRAM N/A 10/14/2012   Procedure: LEFT HEART CATHETERIZATION WITH CORONARY ANGIOGRAM;  Surgeon: Wellington Hampshire, MD;  Location: [facility] CATH LAB;  Service: Cardiovascular;  Laterality: N/A;  . PERCUTANEOUS CORONARY STENT INTERVENTION (PCI-S) Right 10/14/2012   Procedure: PERCUTANEOUS CORONARY STENT INTERVENTION (PCI-S);  Surgeon: Wellington Hampshire, MD;  Location: [facility] CATH LAB;  Service: Cardiovascular;  Laterality: Right;  . THYROIDECTOMY  2008  .  TOTAL VAGINAL HYSTERECTOMY     Complete    OB History    No data available       Home Medications    Prior to Admission medications   Medication Sig Start Date End Date Taking? Authorizing Provider  atorvastatin (LIPITOR) 40 MG tablet Take 1 tablet (40 mg total) by mouth daily at 6 PM. 09/12/15   Peter M Martinique, MD  Cholecalciferol (VITAMIN D PO) Take 1,000  Units by mouth daily.     Historical Provider, MD  iron polysaccharides (FERREX 150) 150 MG capsule Take 150 mg by mouth daily.     Historical Provider, MD  levothyroxine (SYNTHROID, LEVOTHROID) 25 MCG tablet Take 25 mcg by mouth daily before breakfast.    Historical Provider, MD  metoprolol tartrate (LOPRESSOR) 25 MG tablet Take 0.5 tablets (12.5 mg total) by mouth 2 (two) times daily. Please call to schedule appointment for further refills.  Thanks!  #2 05/28/16   Peter M Martinique, MD  metoprolol tartrate (LOPRESSOR) 25 MG tablet TAKE 1/2 TABLET (=12.5MG )  TWO TIMES A DAY (PLEASE    SCHEDULE AN APPOINTMENT FORFUTURE REFILLS) 06/26/16   Peter M Martinique, MD  nitroGLYCERIN (NITROSTAT) 0.4 MG SL tablet Place 1 tablet (0.4 mg total) under the tongue every 5 (five) minutes as needed for chest pain. 10/15/12   Meriel Pica, PA-C    Family History Family History  Problem Relation Age of Onset  . Diabetes Sister     Social History Social History  Substance Use Topics  . Smoking status: Never Smoker  . Smokeless tobacco: Never Used  . Alcohol use No     Allergies   Patient has no known allergies.   Review of Systems Review of Systems All systems reviewed and are negative for acute change except as noted in the HPI.   Physical Exam Updated Vital Signs BP (!) 141/84   Pulse 67   Temp 97.5 F (36.4 C) (Oral)   Resp 18   SpO2 99%   Physical Exam  Constitutional: She is oriented to person, place, and time. She appears well-developed and well-nourished. No distress.  HENT:  Head: Normocephalic and atraumatic.  Nose: Nose normal.  Mouth/Throat: Oropharynx is clear and moist. No oropharyngeal exudate.  Eyes: Conjunctivae and EOM are normal. Pupils are equal, round, and reactive to light. No scleral icterus.  Neck: Normal range of motion. Neck supple. No JVD present. No tracheal deviation present. No thyromegaly present.  Cardiovascular: Normal rate, regular rhythm and normal heart  sounds.  Exam reveals no gallop and no friction rub.   No murmur heard. Pulmonary/Chest: Effort normal and breath sounds normal. No respiratory distress. She has no wheezes. She exhibits no tenderness.  Abdominal: Soft. Bowel sounds are normal. She exhibits no distension and no mass. There is no tenderness. There is no rebound and no guarding.  Musculoskeletal: Normal range of motion. She exhibits edema (BLE). She exhibits no tenderness.  Lymphadenopathy:    She has no cervical adenopathy.  Neurological: She is alert and oriented to person, place, and time. No cranial nerve deficit. She exhibits normal muscle tone.  Skin: Skin is warm and dry. No rash noted. No erythema. No pallor.  Nursing note and vitals reviewed.    ED Treatments / Results  DIAGNOSTIC STUDIES:  Oxygen Saturation is 99% on RA, normal by my interpretation.    COORDINATION OF CARE:  11:45 AM Discussed treatment plan with pt at bedside and pt agreed to plan.  Labs (all  labs ordered are listed, but only abnormal results are displayed) Labs Reviewed  BASIC METABOLIC PANEL - Abnormal; Notable for the following:       Result Value   Glucose, Bld 122 (*)    BUN 28 (*)    Creatinine, Ser 1.24 (*)    GFR calc non Af Amer 36 (*)    GFR calc Af Amer 42 (*)    All other components within normal limits  CBC  PROTIME-INR  I-STAT TROPOININ, ED    EKG  EKG Interpretation  Date/Time:  Tuesday July 17 2016 20:53:08 EDT Ventricular Rate:  86 PR Interval:    QRS Duration: 108 QT Interval:  402 QTC Calculation: 481 R Axis:   -56 Text Interpretation:  Atrial fibrillation Incomplete right bundle branch block Left anterior fascicular block Abnormal ECG No significant change since last tracing Confirmed by Glynn Octave [phone]) on 07/17/2016 11:25:21 PM       Radiology Dg Chest 2 View  Result Date: 07/17/2016 CLINICAL DATA:  Initial evaluation for left-sided chest discomfort. History of previous MI. EXAM:  CHEST  2 VIEW COMPARISON:  Prior radiograph from 10/27/2012. FINDINGS: Mild cardiomegaly, stable from previous. Mediastinal silhouette within normal limits. Aortic atherosclerosis with mild aortic tortuosity noted. Lungs normally inflated. No focal infiltrates. No pulmonary edema or pleural effusion. No pneumothorax. Mild irregular biapical pleural thickening, stable. No acute osseus abnormality. Mild chronic anterior wedging deformity within the lower thoracic spine is grossly stable. IMPRESSION: 1. No active cardiopulmonary disease. 2. Stable cardiomegaly. Electronically Signed   By: Jeannine Boga M.D.   On: 07/17/2016 21:52    Procedures Procedures (including critical care time)  Medications Ordered in ED Medications - No data to display   Initial Impression / Assessment and Plan / ED Course  I have reviewed the triage vital signs and the nursing notes.  Pertinent labs & imaging results that were available during my care of the patient were reviewed by me and considered in my medical decision making (see chart for details).     Patient presents to the ED for chest pain.  She has both typical and atypical history for chest pain concerning for ACS.  She was given full dose aspirin.  Troponin and EKG are negative.  Will call hospitalist for ACS rule out. CXR is negative as well.  12:08 AM Discussed case with Dr. Alcario Drought (hospitalist) agrees to admit.    Final Clinical Impressions(s) / ED Diagnoses   Final diagnoses:  None    New Prescriptions New Prescriptions   No medications on file     I personally performed the services described in this documentation, which was scribed in my presence. The recorded information has been reviewed and is accurate.       Everlene Balls, MD 07/18/16 [phone]

## 2016-07-18 ENCOUNTER — Encounter (HOSPITAL_COMMUNITY): Payer: Self-pay | Admitting: *Deleted

## 2016-07-18 DIAGNOSIS — R0789 Other chest pain: Secondary | ICD-10-CM | POA: Diagnosis not present

## 2016-07-18 DIAGNOSIS — I259 Chronic ischemic heart disease, unspecified: Secondary | ICD-10-CM

## 2016-07-18 DIAGNOSIS — R079 Chest pain, unspecified: Secondary | ICD-10-CM | POA: Diagnosis present

## 2016-07-18 LAB — TROPONIN I
Troponin I: <0.03 ng/mL (ref <0.03)
Troponin I: <0.03 ng/mL (ref <0.03)

## 2016-07-18 LAB — TSH: TSH: 2.779 u[IU]/mL (ref 0.350–4.500)

## 2016-07-18 MED ORDER — ENOXAPARIN SODIUM 40 MG/0.4ML ~~LOC~~ SOLN
40.0000 mg | SUBCUTANEOUS | Status: DC
  Filled 2016-07-18 (×4): qty 0.4

## 2016-07-18 MED ORDER — HYDRALAZINE HCL 20 MG/ML IJ SOLN
5.0000 mg | INTRAMUSCULAR | Status: DC | PRN
  Administered 2016-07-18: 10 mg via INTRAVENOUS
  Filled 2016-07-18: qty 1

## 2016-07-18 MED ORDER — METOPROLOL TARTRATE 25 MG PO TABS: 25.0000 mg | ORAL_TABLET | Freq: Four times a day (QID) | ORAL | Status: DC

## 2016-07-18 MED ORDER — METOPROLOL TARTRATE 12.5 MG HALF TABLET
12.5000 mg | ORAL_TABLET | Freq: Two times a day (BID) | ORAL | Status: DC
  Administered 2016-07-18 (×2): 12.5 mg via ORAL
  Filled 2016-07-18 (×2): qty 1

## 2016-07-18 MED ORDER — ONDANSETRON HCL 4 MG/2ML IJ SOLN: 4.0000 mg | Freq: Four times a day (QID) | INTRAMUSCULAR | Status: DC | PRN

## 2016-07-18 MED ORDER — POLYSACCHARIDE IRON COMPLEX 150 MG PO CAPS
150.0000 mg | ORAL_CAPSULE | Freq: Every day | ORAL | Status: DC
  Administered 2016-07-18 – 2016-07-19 (×2): 150 mg via ORAL
  Filled 2016-07-18 (×2): qty 1

## 2016-07-18 MED ORDER — ACETAMINOPHEN 325 MG PO TABS: 650.0000 mg | ORAL_TABLET | ORAL | Status: DC | PRN

## 2016-07-18 MED ORDER — METOPROLOL TARTRATE 50 MG PO TABS: 50.0000 mg | ORAL_TABLET | Freq: Two times a day (BID) | ORAL | Status: DC

## 2016-07-18 MED ORDER — ASPIRIN 325 MG PO TABS
325.0000 mg | ORAL_TABLET | Freq: Once | ORAL | Status: AC
  Administered 2016-07-18: 325 mg via ORAL
  Filled 2016-07-18: qty 1

## 2016-07-18 MED ORDER — METOPROLOL TARTRATE 25 MG PO TABS
25.0000 mg | ORAL_TABLET | Freq: Two times a day (BID) | ORAL | Status: DC
  Administered 2016-07-18 – 2016-07-19 (×2): 25 mg via ORAL
  Filled 2016-07-18 (×2): qty 1

## 2016-07-18 MED ORDER — ATORVASTATIN CALCIUM 40 MG PO TABS
40.0000 mg | ORAL_TABLET | Freq: Every day | ORAL | Status: DC
  Administered 2016-07-18: 40 mg via ORAL
  Filled 2016-07-18: qty 1

## 2016-07-18 MED ORDER — LEVOTHYROXINE SODIUM 25 MCG PO TABS
25.0000 ug | ORAL_TABLET | Freq: Every day | ORAL | Status: DC
  Administered 2016-07-18 – 2016-07-19 (×2): 25 ug via ORAL
  Filled 2016-07-18 (×2): qty 1

## 2016-07-18 NOTE — Consult Note (Signed)
CARDIOLOGY CONSULT NOTE   Patient ID: Julie Lara MRN: [id number] DOB/AGE: January 14, 1921 83 y.o.  Admit date: 07/17/2016  Requesting Physician: Dr. Sloan Leiter Primary Physician:   Geoffery Lyons, MD Primary Cardiologist:   Dr. Martinique Reason for Consultation:  Chest pain  Julie Lara is a 83 y.o. female who is being seen today for the evaluation of chest pain at the request of Dr. Sloan Leiter.   HPI: Julie Lara is a 83 y.o. female with a history of CAD s/p BMS to RCA (09/2012), PAF (not on [place] given hx of GI bleed), dilated CM, AI, PACs, hypothyroidism, CKD stage III and prior UTIs who presented to [facility] last night for evaluation of chest pain.   Admitted to the hospital with atrial fib with RVR in July 2014. Was placed on Eliquis. Converted to NSR with PACx. Troponins slightly up and so she was cathed and had BMS to the proximal RCA. EF per echo was 40%, moderate LV dilatation, mild LVH, mild MR, mild biatrial enlargement and changes questionable for IVC thrombus. She was continued on Plavix for one month, then this was discontinued.  In November 2014 she was admitted with profound anemia Hgb 6.6 and heme positive stool. Upper and lower endoscopy were negative. Capsule videoendoscopy was also negative. It was decided not to resume anticoagulation.   Last seen by Dr. Martinique in 07/2015 and doing well. Noted to be in sinus at that time.  She was in he usual state of health until last night when she started noticing a prickly sensation in her left chest that was made worse with bending over. No real chest pain and not really bothersome but wanted to come get it checked out. By the time she made it to this hospital her symptoms improved, although she can feel it a little today if she bends over just the right way. No CP or SOB. No LE edema, orthopnea or PND. No dizziness or syncope. No blood in stool or urine.    Past Medical History:  Diagnosis Date  . Acute blood loss anemia    November 2014  . Aortic insufficiency    mild to moderate  . Atrial fibrillation ([place])   . CAD (coronary artery disease)   . Chest pain 04-2010   atypical with negative echocardiogram  . Dilated cardiomyopathy ([place])   . Goiter   . History of thyroidectomy   . Hypothyroidism   . Idiopathic cardiomyopathy ([place]) 1999   EF 40% by Echo on 10/13/12  . NSTEMI (non-ST elevated myocardial infarction) [facility]) July 2014   95% prox RCA s/p BMS  . PAC (premature atrial contraction)    Chronic     Past Surgical History:  Procedure Laterality Date  . biopsy of sacral lesion  2010  . CARDIAC CATHETERIZATION    . COLONOSCOPY N/A 02/23/2013   Procedure: COLONOSCOPY;  Surgeon: Ladene Artist, MD;  Location: [facility] ENDOSCOPY;  Service: Endoscopy;  Laterality: N/A;  . CORONARY ANGIOPLASTY WITH STENT PLACEMENT     BMS-95% prox RCA  . ESOPHAGOGASTRODUODENOSCOPY N/A 02/22/2013   Procedure: ESOPHAGOGASTRODUODENOSCOPY (EGD);  Surgeon: Milus Banister, MD;  Location: [place];  Service: Endoscopy;  Laterality: N/A;  . LEFT HEART CATHETERIZATION WITH CORONARY ANGIOGRAM N/A 10/14/2012   Procedure: LEFT HEART CATHETERIZATION WITH CORONARY ANGIOGRAM;  Surgeon: Wellington Hampshire, MD;  Location: [place] CATH LAB;  Service: Cardiovascular;  Laterality: N/A;  . PERCUTANEOUS CORONARY STENT INTERVENTION (PCI-S) Right 10/14/2012   Procedure: PERCUTANEOUS CORONARY  STENT INTERVENTION (PCI-S);  Surgeon: Wellington Hampshire, MD;  Location: [facility] CATH LAB;  Service: Cardiovascular;  Laterality: Right;  . THYROIDECTOMY  2008  . TOTAL VAGINAL HYSTERECTOMY     Complete    No Known Allergies  I have reviewed the patient's current medications . atorvastatin  40 mg Oral q1800  . enoxaparin (LOVENOX) injection  40 mg Subcutaneous Q24H  . iron polysaccharides  150 mg Oral Daily  . levothyroxine  25 mcg Oral QAC breakfast  . metoprolol tartrate  12.5 mg Oral BID    acetaminophen, hydrALAZINE, ondansetron (ZOFRAN) IV  Prior to  Admission medications   Medication Sig Start Date End Date Taking? Authorizing Provider  aspirin EC 81 MG tablet Take 81 mg by mouth daily.   Yes Historical Provider, MD  atorvastatin (LIPITOR) 40 MG tablet Take 1 tablet (40 mg total) by mouth daily at 6 PM. 09/12/15  Yes Peter M Martinique, MD  Cholecalciferol (VITAMIN D PO) Take 1,000 Units by mouth daily.    Yes Historical Provider, MD  iron polysaccharides (FERREX 150) 150 MG capsule Take 150 mg by mouth 2 (two) times daily.    Yes Historical Provider, MD  levothyroxine (SYNTHROID, LEVOTHROID) 25 MCG tablet Take 25 mcg by mouth daily before breakfast.   Yes Historical Provider, MD  metoprolol tartrate (LOPRESSOR) 25 MG tablet Take 0.5 tablets (12.5 mg total) by mouth 2 (two) times daily. Please call to schedule appointment for further refills.  Thanks!  #2 05/28/16  Yes Peter M Martinique, MD  nitroGLYCERIN (NITROSTAT) 0.4 MG SL tablet Place 1 tablet (0.4 mg total) under the tongue every 5 (five) minutes as needed for chest pain. 10/15/12  Yes Meriel Pica, PA-C     Social History   Social History  . Marital status: Widowed    Spouse name: N/A  . Number of children: N/A  . Years of education: N/A   Occupational History  . Retired Astronomer [employer]   Social History Main Topics  . Smoking status: Never Smoker  . Smokeless tobacco: Never Used  . Alcohol use No  . Drug use: No  . Sexual activity: Not Currently   Other Topics Concern  . Not on file   Social History Narrative   Pt lives alone, but has family close by.    Family Status  Relation Status  . Mother Deceased at age 57   Unknown Causes  . Father Deceased at age 8   Intestinal Cancer  . Sister Deceased at age 32   Died with complications of diabetes  . Brother Deceased at age 77   [place]  . Brother Deceased at age 70   Died in a crane accident  . Sister    Family History  Problem Relation Age of Onset  . Diabetes Sister        ROS:  Full 14 point review  of systems complete and found to be negative unless listed above.  Physical Exam: Blood pressure (!) 177/97, pulse 95, temperature 97.6 F (36.4 C), temperature source Oral, resp. rate 18, height 5\' 4"  (1.626 m), weight 134 lb 3.2 oz (60.9 kg), SpO2 95 %.  General: Well developed, well nourished, female in no acute distress, elderly and frail, looks younger than stated age  Head: Eyes PERRLA, No xanthomas.   Normocephalic and atraumatic, oropharynx without edema or exudate.  Lungs: CTAB Heart: HRRR S1 S2, no rub/gallop, Heart regular rate and rhythm with S1, S2  murmur. pulses are 2+ extrem.  Neck: No carotid bruits. No lymphadenopathy. No JVD. Abdomen: Bowel sounds present, abdomen soft and non-tender without masses or hernias noted. Msk:  No spine or cva tenderness. No weakness, no joint deformities or effusions. Extremities: No clubbing or cyanosis. No LE  edema.  Neuro: Alert and oriented X 3. No focal deficits noted. Psych:  Good affect, responds appropriately Skin: No rashes or lesions noted.  Labs:   Lab Results  Component Value Date   WBC 4.8 07/17/2016   HGB 12.4 07/17/2016   HCT 37.7 07/17/2016   MCV 89.8 07/17/2016   PLT 161 07/17/2016    Recent Labs  07/17/16 2057  INR 1.03    Recent Labs Lab 07/17/16 2057  NA 138  Julie 4.1  CL 105  CO2 23  BUN 28*  CREATININE 1.24*  CALCIUM 9.3  GLUCOSE 122*   Magnesium  Date Value Ref Range Status  10/12/2012 2.3 1.5 - 2.5 mg/dL Final    Recent Labs  07/18/16 0030 07/18/16 0309 07/18/16 0609  TROPONINI <0.03 <0.03 <0.03    Recent Labs  07/17/16 2107  TROPIPOC 0.01    Lab Results  Component Value Date   CHOL 146 12/15/2012   HDL 64.10 12/15/2012   LDLCALC 71 12/15/2012   TRIG 54.0 12/15/2012   No results found for: DDIMER No results found for: LIPASE, AMYLASE TSH  Date/Time Value Ref Range Status  02/21/2013 08:26 PM 0.348 (L) 0.350 - 4.500 uIU/mL Final    Comment:    Performed at [facility]    Cath 09/2012 Final Conclusions:  1. Severe one-vessel coronary artery disease. 2. Successful angioplasty and bare-metal stent placement to the proximal right coronary artery. 3. Elevated BP with mildly elevated LVEDP.  Recommendations:  I will resume Eliquis tomorrow morning. I recommend treatment with Plavix without aspirin for one month. After one month, Plavix can be switched to aspirin 81 mg once daily to be continued with  anticoagulation. I will start small dose metoprolol 25 mg twice daily and atorvastatin.  Echo: 10/13/2012 LV EF: 40% Study Conclusions - Left ventricle: The cavity size was moderately dilated. Wall thickness was increased in a pattern of mild LVH. The estimated ejection fraction was 40%. Diffuse hypokinesis. - Aortic valve: Cannot tell if valve is tri leaflet or not Mild regurgitation. - Mitral valve: Mild regurgitation. - Left atrium: The atrium was mildly dilated. - Right atrium: The atrium was mildly dilated. - Atrial septum: No defect or patent foramen ovale was identified. - Impressions: Cannot r/o thrombus in IVC on limited subcostal window Impressions: - Cannot r/o thrombus in IVC on limited subcostal window  ECG:  Atrial fibrillation with CVR - personally reviewed  TELE: atrial fibrillation with RVR, now converted to NSR with PACs - personally reviewed  Radiology:  Dg Chest 2 View  Result Date: 07/17/2016 CLINICAL DATA:  Initial evaluation for left-sided chest discomfort. History of previous MI. EXAM: CHEST  2 VIEW COMPARISON:  Prior radiograph from 10/27/2012. FINDINGS: Mild cardiomegaly, stable from previous. Mediastinal silhouette within normal limits. Aortic atherosclerosis with mild aortic tortuosity noted. Lungs normally inflated. No focal infiltrates. No pulmonary edema or pleural effusion. No pneumothorax. Mild irregular biapical pleural thickening, stable. No acute osseus abnormality. Mild chronic anterior wedging  deformity within the lower thoracic spine is grossly stable. IMPRESSION: 1. No active cardiopulmonary disease. 2. Stable cardiomegaly. Electronically Signed   By: Jeannine Boga M.D.   On: 07/17/2016 21:52    ASSESSMENT AND PLAN:    Active Problems:  Chest pain, rule out acute myocardial infarction  Julie Lara is a 83 y.o. female with a history of CAD s/p BMS to RCA (09/2012), PAF (not on [place] given hx of GI bleed), dilated CM, AI, PACs, hypothyroidism, CKD stage III and prior UTIs who presented to [facility] last night for evaluation of chest pain.  Chest pain: described as a "prickly" sensation in her left chest that is worse with bending over. No real pain. Now feeling fine. She has ruled out for MI. I think symptoms more related to afib with RVR.  CAD s/p BMS to RCA: on no antiplatelet therapy due to history of GI bleed. Continue BB and statin   PAF/ afib with RVR: HR have been elevated. Now converted to NSR with PACs. She is not on [facility] given history of GI bleeding. CHADSVASC of at least 6 (CHF, HTN, age, vasc dz, f sex). We may think about starting a baby ASA, will defer to Dr. Harrington Challenger. Lopressor increased from 12.5mg  to 25 mg BID. Will check a TSH  HTN: BP has been elevated but currently better. Increasing lopressor as above  Hx of GI bleed: no clear source  Cardiomyopathy: EF 40% back in 2014. No s/s CHF  HLD: continue statin   Hypothyroidism: continue synthroid. Will check a TSH  Signed: Angelena Form, PA-C 07/18/2016 9:39 AM  Pager [phone]  Co-Sign MD  Pt seen and examined  I agree with findings of Julie Lara above  Pt with known CAD and also PAF Admitted with abnormal sensation in chest  Not CP  No SOB   On admit HR rapid, irreg.   Since admit, she r/o for MI  No SOB  No CP   Tele:  SR with multifocal atrial rhythm at times and at times burst of atrial fib.   On exam:  Pt comfortable  Wants to go home  Neck:  JVP normal  Lungs CTA  Cardiac RRR  No S3  Abd Supple   Ext without edema  I would recomm increasing b blockad and following BP and HR   I do not think she is a good candidate for anticoagulation  Had episode a few years ago on Eliquis of dropping Hgb to 6 range  Work up negative   I have discussed this with her.  If does OK with ambulation and BP / HR OK she can go home   WOUld follow as outpt.  Dorris Carnes

## 2016-07-18 NOTE — Progress Notes (Signed)
Admitted by Dr. Alcario Drought overnight-chart reviewed in detail.  Patient seen and examined 83 year old female with known history of paroxysmal atrial fibrillation-no longer on anticoagulation due to GI bleeding, no history of CAD status post remote PCI-admitted for evaluation of left sided chest pain.  Telemetry: Bursts of RVR  Impression: Atypical chest pain-suspect could be related to atrial fibrillation with RVR. PAF with RVR-back in sinus rhythm.  Plan: Discussed with cardiology-who has increased beta blocker dosage-recommendations are to monitor overnight, and home tomorrow morning if rate is still adequately controlled.

## 2016-07-18 NOTE — H&P (Addendum)
History and Physical    ALLURE GREASER LOV:[id number] DOB: 1920/08/06 DOA: 07/17/2016  PCP: Geoffery Lyons, MD  Patient coming from: Home  I have personally briefly reviewed patient's old medical records in [place]  Chief Complaint: Chest pain  HPI: Julie Lara is a 83 y.o. female with medical history significant of A.Fib, CAD with BMS to prox RCA.  Patient presents to the ED with c/o intermittent left sided CP which onset yesterday.  Symptoms onset while patient was getting dressed for a funeral.  Pricking sensation in chest.  No SOB, N/V.  Nothing makes better or worse.   ED Course: Trop neg.   Review of Systems: As per HPI otherwise 10 point review of systems negative.   Past Medical History:  Diagnosis Date  . Acute blood loss anemia    November 2014  . Aortic insufficiency    mild to moderate  . Atrial fibrillation ([place])   . CAD (coronary artery disease)   . Chest pain 04-2010   atypical with negative echocardiogram  . Dilated cardiomyopathy ([place])   . Goiter   . History of thyroidectomy   . Hypothyroidism   . Idiopathic cardiomyopathy ([place]) 1999   EF 40% by Echo on 10/13/12  . NSTEMI (non-ST elevated myocardial infarction) [facility]) July 2014   95% prox RCA s/p BMS  . PAC (premature atrial contraction)    Chronic    Past Surgical History:  Procedure Laterality Date  . biopsy of sacral lesion  2010  . CARDIAC CATHETERIZATION    . COLONOSCOPY N/A 02/23/2013   Procedure: COLONOSCOPY;  Surgeon: Ladene Artist, MD;  Location: [facility] ENDOSCOPY;  Service: Endoscopy;  Laterality: N/A;  . CORONARY ANGIOPLASTY WITH STENT PLACEMENT     BMS-95% prox RCA  . ESOPHAGOGASTRODUODENOSCOPY N/A 02/22/2013   Procedure: ESOPHAGOGASTRODUODENOSCOPY (EGD);  Surgeon: Milus Banister, MD;  Location: [place];  Service: Endoscopy;  Laterality: N/A;  . LEFT HEART CATHETERIZATION WITH CORONARY ANGIOGRAM N/A 10/14/2012   Procedure: LEFT HEART CATHETERIZATION WITH CORONARY  ANGIOGRAM;  Surgeon: Wellington Hampshire, MD;  Location: [facility] CATH LAB;  Service: Cardiovascular;  Laterality: N/A;  . PERCUTANEOUS CORONARY STENT INTERVENTION (PCI-S) Right 10/14/2012   Procedure: PERCUTANEOUS CORONARY STENT INTERVENTION (PCI-S);  Surgeon: Wellington Hampshire, MD;  Location: [facility] CATH LAB;  Service: Cardiovascular;  Laterality: Right;  . THYROIDECTOMY  2008  . TOTAL VAGINAL HYSTERECTOMY     Complete     reports that she has never smoked. She has never used smokeless tobacco. She reports that she does not drink alcohol or use drugs.  No Known Allergies  Family History  Problem Relation Age of Onset  . Diabetes Sister      Prior to Admission medications   Medication Sig Start Date End Date Taking? Authorizing Provider  atorvastatin (LIPITOR) 40 MG tablet Take 1 tablet (40 mg total) by mouth daily at 6 PM. 09/12/15   Peter M Martinique, MD  Cholecalciferol (VITAMIN D PO) Take 1,000 Units by mouth daily.     Historical Provider, MD  iron polysaccharides (FERREX 150) 150 MG capsule Take 150 mg by mouth daily.     Historical Provider, MD  levothyroxine (SYNTHROID, LEVOTHROID) 25 MCG tablet Take 25 mcg by mouth daily before breakfast.    Historical Provider, MD  metoprolol tartrate (LOPRESSOR) 25 MG tablet Take 0.5 tablets (12.5 mg total) by mouth 2 (two) times daily. Please call to schedule appointment for further refills.  Thanks!  #2 05/28/16   Collier Salina  M Martinique, MD  nitroGLYCERIN (NITROSTAT) 0.4 MG SL tablet Place 1 tablet (0.4 mg total) under the tongue every 5 (five) minutes as needed for chest pain. 10/15/12   Meriel Pica, PA-C    Physical Exam: Vitals:   07/17/16 2056 07/17/16 2304 07/18/16 0000  BP: 134/67 (!) 141/84 (!) 194/81  Pulse: 78 67 (!) 58  Resp: 17 18 (!) 24  Temp: 97.5 F (36.4 C)    TempSrc: Oral    SpO2: 96% 99% 99%    Constitutional: NAD, calm, comfortable Eyes: PERRL, lids and conjunctivae normal ENMT: Mucous membranes are moist. Posterior pharynx clear of  any exudate or lesions.Normal dentition.  Neck: normal, supple, no masses, no thyromegaly Respiratory: clear to auscultation bilaterally, no wheezing, no crackles. Normal respiratory effort. No accessory muscle use.  Cardiovascular: Regular rate and rhythm, no murmurs / rubs / gallops. No extremity edema. 2+ pedal pulses. No carotid bruits.  Abdomen: no tenderness, no masses palpated. No hepatosplenomegaly. Bowel sounds positive.  Musculoskeletal: no clubbing / cyanosis. No joint deformity upper and lower extremities. Good ROM, no contractures. Normal muscle tone.  Skin: no rashes, lesions, ulcers. No induration Neurologic: CN 2-12 grossly intact. Sensation intact, DTR normal. Strength 5/5 in all 4.  Psychiatric: Normal judgment and insight. Alert and oriented x 3. Normal mood.    Labs on Admission: I have personally reviewed following labs and imaging studies  CBC:  Recent Labs Lab 07/17/16 2057  WBC 4.8  HGB 12.4  HCT 37.7  MCV 89.8  PLT 638   Basic Metabolic Panel:  Recent Labs Lab 07/17/16 2057  NA 138  K 4.1  CL 105  CO2 23  GLUCOSE 122*  BUN 28*  CREATININE 1.24*  CALCIUM 9.3   GFR: CrCl cannot be calculated (Unknown ideal weight.). Liver Function Tests: No results for input(s): AST, ALT, ALKPHOS, BILITOT, PROT, ALBUMIN in the last 168 hours. No results for input(s): LIPASE, AMYLASE in the last 168 hours. No results for input(s): AMMONIA in the last 168 hours. Coagulation Profile:  Recent Labs Lab 07/17/16 2057  INR 1.03   Cardiac Enzymes: No results for input(s): CKTOTAL, CKMB, CKMBINDEX, TROPONINI in the last 168 hours. BNP (last 3 results) No results for input(s): PROBNP in the last 8760 hours. HbA1C: No results for input(s): HGBA1C in the last 72 hours. CBG: No results for input(s): GLUCAP in the last 168 hours. Lipid Profile: No results for input(s): CHOL, HDL, LDLCALC, TRIG, CHOLHDL, LDLDIRECT in the last 72 hours. Thyroid Function Tests: No  results for input(s): TSH, T4TOTAL, FREET4, T3FREE, THYROIDAB in the last 72 hours. Anemia Panel: No results for input(s): VITAMINB12, FOLATE, FERRITIN, TIBC, IRON, RETICCTPCT in the last 72 hours. Urine analysis:    Component Value Date/Time   COLORURINE YELLOW 10/14/2012 2010   APPEARANCEUR HAZY (A) 10/14/2012 2010   LABSPEC 1.017 10/14/2012 2010   PHURINE 5.5 10/14/2012 2010   GLUCOSEU NEGATIVE 10/14/2012 2010   HGBUR TRACE (A) 10/14/2012 2010   BILIRUBINUR NEGATIVE 10/14/2012 2010   KETONESUR NEGATIVE 10/14/2012 2010   PROTEINUR NEGATIVE 10/14/2012 2010   UROBILINOGEN 0.2 10/14/2012 2010   NITRITE POSITIVE (A) 10/14/2012 2010   LEUKOCYTESUR MODERATE (A) 10/14/2012 2010    Radiological Exams on Admission: Dg Chest 2 View  Result Date: 07/17/2016 CLINICAL DATA:  Initial evaluation for left-sided chest discomfort. History of previous MI. EXAM: CHEST  2 VIEW COMPARISON:  Prior radiograph from 10/27/2012. FINDINGS: Mild cardiomegaly, stable from previous. Mediastinal silhouette within normal limits. Aortic atherosclerosis  with mild aortic tortuosity noted. Lungs normally inflated. No focal infiltrates. No pulmonary edema or pleural effusion. No pneumothorax. Mild irregular biapical pleural thickening, stable. No acute osseus abnormality. Mild chronic anterior wedging deformity within the lower thoracic spine is grossly stable. IMPRESSION: 1. No active cardiopulmonary disease. 2. Stable cardiomegaly. Electronically Signed   By: Jeannine Boga M.D.   On: 07/17/2016 21:52    EKG: Independently reviewed.  Assessment/Plan Active Problems:   Chest pain, rule out acute myocardial infarction    1. CP R/O - 1. CP obs pathway 2. Serial trops 3. Tele monitor 4. NPO 5. Call cards in AM 2. HTN - 1. Continue metoprolol 2. Add PRN hydralazine if needed as SBP running on the high side here in ED.  DVT prophylaxis: Lovenox Code Status: Full Family Communication: No family in  room Disposition Plan: Home after admit Consults called: None Admission status: Place in obs   Anylah Scheib, [place] [facility] Pager [phone]  If 7AM-7PM, please contact day team taking care of patient [email or web address] Password [email or web address]  07/18/2016, 12:21 AM

## 2016-07-19 DIAGNOSIS — R0789 Other chest pain: Secondary | ICD-10-CM | POA: Diagnosis not present

## 2016-07-19 DIAGNOSIS — I4891 Unspecified atrial fibrillation: Secondary | ICD-10-CM

## 2016-07-19 DIAGNOSIS — R079 Chest pain, unspecified: Secondary | ICD-10-CM | POA: Diagnosis not present

## 2016-07-19 MED ORDER — METOPROLOL TARTRATE 25 MG PO TABS: 25.0000 mg | ORAL_TABLET | Freq: Two times a day (BID) | ORAL | 0 refills | Status: DC

## 2016-07-19 NOTE — Progress Notes (Signed)
Progress Note  Patient Name: Julie Lara Date of Encounter: 07/19/2016  Primary Cardiologist: [place]  Subjective   No CP  No dizziness   Inpatient Medications    Scheduled Meds: . atorvastatin  40 mg Oral q1800  . enoxaparin (LOVENOX) injection  40 mg Subcutaneous Q24H  . iron polysaccharides  150 mg Oral Daily  . levothyroxine  25 mcg Oral QAC breakfast  . metoprolol tartrate  25 mg Oral BID   Continuous Infusions:  PRN Meds: acetaminophen, hydrALAZINE, ondansetron (ZOFRAN) IV   Vital Signs    Vitals:   07/18/16 2133 07/19/16 0100 07/19/16 0555 07/19/16 0740  BP: (!) 120/91 118/88 140/88 (!) 161/78  Pulse: 82 80 89 71  Resp: 18 17 16 17   Temp: 98.4 F (36.9 C) 98.2 F (36.8 C) 97.5 F (36.4 C) 98 F (36.7 C)  TempSrc: Oral Oral Oral Oral  SpO2: 98% 99% 96% 98%  Weight:   131 lb 6.4 oz (59.6 kg)   Height:        Intake/Output Summary (Last 24 hours) at 07/19/16 0811 Last data filed at 07/19/16 0100  Gross per 24 hour  Intake              480 ml  Output                0 ml  Net              480 ml   Filed Weights   07/18/16 0125 07/19/16 0555  Weight: 134 lb 3.2 oz (60.9 kg) 131 lb 6.4 oz (59.6 kg)    Telemetry     SR this AM  PAF (bursts) yesterday  - Personally Reviewed  ECG    Physical Exam   GEN: No acute distress.   Neck: No JVD Cardiac: RRR, no murmurs, rubs, or gallops.  Respiratory: Clear to auscultation bilaterally. GI: Soft, nontender, non-distended  MS: No edema; No deformity. Neuro:  Nonfocal  Psych: Normal affect   Labs    Chemistry Recent Labs Lab 07/17/16 2057  NA 138  K 4.1  CL 105  CO2 23  GLUCOSE 122*  BUN 28*  CREATININE 1.24*  CALCIUM 9.3  GFRNONAA 36*  GFRAA 42*  ANIONGAP 10     Hematology Recent Labs Lab 07/17/16 2057  WBC 4.8  RBC 4.20  HGB 12.4  HCT 37.7  MCV 89.8  MCH 29.5  MCHC 32.9  RDW 13.4  PLT 161    Cardiac Enzymes Recent Labs Lab 07/18/16 0030 07/18/16 0309  07/18/16 0609  TROPONINI <0.03 <0.03 <0.03    Recent Labs Lab 07/17/16 2107  TROPIPOC 0.01     BNPNo results for input(s): BNP, PROBNP in the last 168 hours.   DDimer No results for input(s): DDIMER in the last 168 hours.   Radiology    Dg Chest 2 View  Result Date: 07/17/2016 CLINICAL DATA:  Initial evaluation for left-sided chest discomfort. History of previous MI. EXAM: CHEST  2 VIEW COMPARISON:  Prior radiograph from 10/27/2012. FINDINGS: Mild cardiomegaly, stable from previous. Mediastinal silhouette within normal limits. Aortic atherosclerosis with mild aortic tortuosity noted. Lungs normally inflated. No focal infiltrates. No pulmonary edema or pleural effusion. No pneumothorax. Mild irregular biapical pleural thickening, stable. No acute osseus abnormality. Mild chronic anterior wedging deformity within the lower thoracic spine is grossly stable. IMPRESSION: 1. No active cardiopulmonary disease. 2. Stable cardiomegaly. Electronically Signed   By: Jeannine Boga M.D.   On: 07/17/2016 21:52  Cardiac Studies     Patient Profile     83 y.o. female  hxofchronicsystolicCHF< PAF, hypothyroidism  Admitted with abnormal sensation in chest    Assessment & Plan    1  Rhythm  Pt now on 25 bid of metoprolol  Heart rhythm has quieted some  Yesterday had SR, PAT  Short bursts of probable atrial fib  Would keep on metoprolol at a little higher dose  Tolerating  I would not with history of bleeding anticoag  Could consider po amio in future if still a problem    2  CAD  NO evid of active ischemia  3  HTN  Continue meds    Will make sure she has appt in clinic   Signed, Dorris Carnes, MD  07/19/2016, 8:11 AM

## 2016-07-19 NOTE — Discharge Summary (Signed)
PATIENT DETAILS Name: Julie Lara Age: 83 y.o. Sex: female Date of Birth: [birth year] MRN: [id number]. Admitting Physician: Etta Quill, DO YHC:[id number],BTDVVOH A, MD  Admit Date: 07/17/2016 Discharge date: 07/19/2016  Recommendations for Outpatient Follow-up:  1. Follow up with PCP in 1-2 weeks 2. Please obtain BMP/CBC in one week 3. Please ensure follow up with cardiology   Admitted From:  Home  Disposition: [place]: No  Equipment/Devices: None  Discharge Condition: Stable  CODE STATUS: FULL CODE  Diet recommendation:  Heart Healthy  Brief Summary: See H&P, Labs, Consult and Test reports for all details in brief, patient is a 83 year old female with prior history of CAD status post remote PCI, A. fib no longer on anticoagulation admitted for evaluation of chest pain.  Brief Hospital Course: Chest pain: Thought to be atypical, likely related to A. fib with RVR. Seen by cardiology, no further recommendations-stable for discharge. No chest pain at the time of discharge.  A. fib with RVR: Upon hospitalization-telemetry reviewed numerous bursts of A. Fib. Metoprolol dosing increased to 25 mg twice a day. No longer having RVR overnight. Spoke with Dr. Harrington Challenger today-no further recommendations-stable for discharge. Continue aspirin, not deemed to be a anticoagulation candidate-given prior history of severe blood loss anemia from GI bleeding.  Rest of her medical issues were stable during this short hospital stay.  Procedures/Studies: None  Discharge Diagnoses:  Active Problems:   Chest pain, rule out acute myocardial infarction   Discharge Instructions:  Activity:  As tolerated   Discharge Instructions    Call MD for:  persistant dizziness or light-headedness    Complete by:  As directed    Call MD for:  severe uncontrolled pain    Complete by:  As directed    Diet - low sodium heart healthy    Complete by:  As directed    Increase activity  slowly    Complete by:  As directed      Allergies as of 07/19/2016   No Known Allergies     Medication List    TAKE these medications   aspirin EC 81 MG tablet Take 81 mg by mouth daily.   atorvastatin 40 MG tablet Commonly known as:  LIPITOR Take 1 tablet (40 mg total) by mouth daily at 6 PM.   FERREX 150 150 MG capsule Generic drug:  iron polysaccharides Take 150 mg by mouth 2 (two) times daily.   levothyroxine 25 MCG tablet Commonly known as:  SYNTHROID, LEVOTHROID Take 25 mcg by mouth daily before breakfast.   metoprolol tartrate 25 MG tablet Commonly known as:  LOPRESSOR Take 1 tablet (25 mg total) by mouth 2 (two) times daily. Please call to schedule appointment for further refills.  Thanks!  #2 What changed:  how much to take   nitroGLYCERIN 0.4 MG SL tablet Commonly known as:  NITROSTAT Place 1 tablet (0.4 mg total) under the tongue every 5 (five) minutes as needed for chest pain.   VITAMIN D PO Take 1,000 Units by mouth daily.      Follow-up Information    ARONSON,RICHARD A, MD. Schedule an appointment as soon as possible for a visit in 1 week(s).   Specialty:  Internal Medicine Contact information: [address] [place] [address] [phone]          No Known Allergies  Consultations:   cardiology  Other Procedures/Studies: Dg Chest 2 View  Result Date: 07/17/2016 CLINICAL DATA:  Initial evaluation for left-sided chest  discomfort. History of previous MI. EXAM: CHEST  2 VIEW COMPARISON:  Prior radiograph from 10/27/2012. FINDINGS: Mild cardiomegaly, stable from previous. Mediastinal silhouette within normal limits. Aortic atherosclerosis with mild aortic tortuosity noted. Lungs normally inflated. No focal infiltrates. No pulmonary edema or pleural effusion. No pneumothorax. Mild irregular biapical pleural thickening, stable. No acute osseus abnormality. Mild chronic anterior wedging deformity within the lower thoracic spine is grossly  stable. IMPRESSION: 1. No active cardiopulmonary disease. 2. Stable cardiomegaly. Electronically Signed   By: Jeannine Boga M.D.   On: 07/17/2016 21:52      TODAY-DAY OF DISCHARGE:  Subjective:   Julie Lara today has no headache,no chest abdominal pain,no new weakness tingling or numbness, feels much better wants to go home today.   Objective:   Blood pressure (!) 161/78, pulse 71, temperature 98 F (36.7 C), temperature source Oral, resp. rate 17, height 5\' 4"  (1.626 m), weight 59.6 kg (131 lb 6.4 oz), SpO2 98 %.  Intake/Output Summary (Last 24 hours) at 07/19/16 1038 Last data filed at 07/19/16 0900  Gross per 24 hour  Intake              600 ml  Output                0 ml  Net              600 ml   Filed Weights   07/18/16 0125 07/19/16 0555  Weight: 60.9 kg (134 lb 3.2 oz) 59.6 kg (131 lb 6.4 oz)    Exam: Awake Alert, Oriented *3, No new F.N deficits, Normal affect [place].AT,PERRAL Supple Neck,No JVD, No cervical lymphadenopathy appriciated.  Symmetrical Chest wall movement, Good air movement bilaterally, CTAB RRR,No Gallops,Rubs or new Murmurs, No Parasternal Heave +ve B.Sounds, Abd Soft, Non tender, No organomegaly appriciated, No rebound -guarding or rigidity. No Cyanosis, Clubbing or edema, No new Rash or bruise   PERTINENT RADIOLOGIC STUDIES: Dg Chest 2 View  Result Date: 07/17/2016 CLINICAL DATA:  Initial evaluation for left-sided chest discomfort. History of previous MI. EXAM: CHEST  2 VIEW COMPARISON:  Prior radiograph from 10/27/2012. FINDINGS: Mild cardiomegaly, stable from previous. Mediastinal silhouette within normal limits. Aortic atherosclerosis with mild aortic tortuosity noted. Lungs normally inflated. No focal infiltrates. No pulmonary edema or pleural effusion. No pneumothorax. Mild irregular biapical pleural thickening, stable. No acute osseus abnormality. Mild chronic anterior wedging deformity within the lower thoracic spine is grossly stable.  IMPRESSION: 1. No active cardiopulmonary disease. 2. Stable cardiomegaly. Electronically Signed   By: Jeannine Boga M.D.   On: 07/17/2016 21:52     PERTINENT LAB RESULTS: CBC:  Recent Labs  07/17/16 2057  WBC 4.8  HGB 12.4  HCT 37.7  PLT 161   CMET CMP     Component Value Date/Time   NA 138 07/17/2016 2057   K 4.1 07/17/2016 2057   CL 105 07/17/2016 2057   CO2 23 07/17/2016 2057   GLUCOSE 122 (H) 07/17/2016 2057   BUN 28 (H) 07/17/2016 2057   CREATININE 1.24 (H) 07/17/2016 2057   CALCIUM 9.3 07/17/2016 2057   PROT 6.5 12/15/2012 1552   ALBUMIN 3.9 12/15/2012 1552   AST 19 12/15/2012 1552   ALT 17 12/15/2012 1552   ALKPHOS 73 12/15/2012 1552   BILITOT 0.6 12/15/2012 1552   GFRNONAA 36 (L) 07/17/2016 2057   GFRAA 42 (L) 07/17/2016 2057    GFR Estimated Creatinine Clearance: 24 mL/min (A) (by C-G formula based on SCr of 1.24 mg/dL (  H)). No results for input(s): LIPASE, AMYLASE in the last 72 hours.  Recent Labs  07/18/16 0030 07/18/16 0309 07/18/16 0609  TROPONINI <0.03 <0.03 <0.03   Invalid input(s): POCBNP No results for input(s): DDIMER in the last 72 hours. No results for input(s): HGBA1C in the last 72 hours. No results for input(s): CHOL, HDL, LDLCALC, TRIG, CHOLHDL, LDLDIRECT in the last 72 hours.  Recent Labs  07/18/16 1221  TSH 2.779   No results for input(s): VITAMINB12, FOLATE, FERRITIN, TIBC, IRON, RETICCTPCT in the last 72 hours. Coags:  Recent Labs  07/17/16 2057  INR 1.03   Microbiology: No results found for this or any previous visit (from the past 240 hour(s)).  FURTHER DISCHARGE INSTRUCTIONS:  Get Medicines reviewed and adjusted: Please take all your medications with you for your next visit with your Primary MD  Laboratory/radiological data: Please request your Primary MD to go over all hospital tests and procedure/radiological results at the follow up, please ask your Primary MD to get all Hospital records sent to  his/her office.  In some cases, they will be blood work, cultures and biopsy results pending at the time of your discharge. Please request that your primary care M.D. goes through all the records of your hospital data and follows up on these results.  Also Note the following: If you experience worsening of your admission symptoms, develop shortness of breath, life threatening emergency, suicidal or homicidal thoughts you must seek medical attention immediately by calling 911 or calling your MD immediately  if symptoms less severe.  You must read complete instructions/literature along with all the possible adverse reactions/side effects for all the Medicines you take and that have been prescribed to you. Take any new Medicines after you have completely understood and accpet all the possible adverse reactions/side effects.   Do not drive when taking Pain medications or sleeping medications (Benzodaizepines)  Do not take more than prescribed Pain, Sleep and Anxiety Medications. It is not advisable to combine anxiety,sleep and pain medications without talking with your primary care practitioner  Special Instructions: If you have smoked or chewed Tobacco  in the last 2 yrs please stop smoking, stop any regular Alcohol  and or any Recreational drug use.  Wear Seat belts while driving.  Please note: You were cared for by a hospitalist during your hospital stay. Once you are discharged, your primary care physician will handle any further medical issues. Please note that NO REFILLS for any discharge medications will be authorized once you are discharged, as it is imperative that you return to your primary care physician (or establish a relationship with a primary care physician if you do not have one) for your post hospital discharge needs so that they can reassess your need for medications and monitor your lab values.  Total Time spent coordinating discharge including counseling, education and face to  face time equals 45 minutes.  SignedOren Binet 07/19/2016 10:38 AM

## 2016-08-03 NOTE — Progress Notes (Signed)
Cardiology Office Note    Date:  08/06/2016   ID:  Julie Lara, DOB 14-Aug-1920, MRN [id number]  PCP:  Burnard Bunting, MD  Cardiologist: Dr. Martinique  Chief Complaint  Patient presents with  . Hospitalization Follow-up    History of Present Illness:    Julie Lara is a 83 y.o. female with past medical history of CAD (s/p BMS to RCA in 2014), PAF (not on anticoagulation secondary to GI bleed), moderate AI, hypothyroidism, and Stage 3 CKD who presents to the office today for hospital follow-up.   She was recently admitted to [facility] from 4/17 - 07/19/2016 for evaluation of chest pain. Reported a new sensation along her left chest which was worse with bending over. She denied any associated dyspnea, nausea, vomiting, diaphoresis, or radiating pain. Upon arrival to the ED, her HR was elevated into the 100's. Lopressor was increased from 12.5mg  BID to 25mg  BID as her presenting symptoms were thought to be secondary to her elevated HR. Cyclic troponin values remained negative and her EKG showed no acute ischemic changes, therefore further ischemic evaluation was not pursued.   In talking with the patient today, she denies any recurrent symptoms since her recent hospitalization. No chest discomfort, palpitations, lightheadedness, dizziness, or presyncope. Breathing has been at baseline with no exertional dyspnea, orthopnea, or PND. She has returned to working in her garden multiples times per week.   She reports good compliance with her medication regimen including Lopressor 25 mg twice a day. She is not on anticoagulation for her atrial fibrillation secondary to history of GI bleeds. Remains on ASA 81 mg daily and denies any symptoms with this.    Past Medical History:  Diagnosis Date  . Acute blood loss anemia    November 2014  . Aortic insufficiency    mild to moderate  . Atrial fibrillation ([place])   . CAD (coronary artery disease)   . Chest pain 04-2010   atypical with  negative echocardiogram  . Dilated cardiomyopathy ([place])   . Goiter   . History of thyroidectomy   . Hypothyroidism   . Idiopathic cardiomyopathy ([place]) 1999   EF 40% by Echo on 10/13/12  . NSTEMI (non-ST elevated myocardial infarction) [facility]) July 2014   95% prox RCA s/p BMS  . PAC (premature atrial contraction)    Chronic    Past Surgical History:  Procedure Laterality Date  . biopsy of sacral lesion  2010  . CARDIAC CATHETERIZATION    . COLONOSCOPY N/A 02/23/2013   Procedure: COLONOSCOPY;  Surgeon: Ladene Artist, MD;  Location: [facility] ENDOSCOPY;  Service: Endoscopy;  Laterality: N/A;  . CORONARY ANGIOPLASTY WITH STENT PLACEMENT     BMS-95% prox RCA  . ESOPHAGOGASTRODUODENOSCOPY N/A 02/22/2013   Procedure: ESOPHAGOGASTRODUODENOSCOPY (EGD);  Surgeon: Milus Banister, MD;  Location: [place];  Service: Endoscopy;  Laterality: N/A;  . LEFT HEART CATHETERIZATION WITH CORONARY ANGIOGRAM N/A 10/14/2012   Procedure: LEFT HEART CATHETERIZATION WITH CORONARY ANGIOGRAM;  Surgeon: Wellington Hampshire, MD;  Location: [place] CATH LAB;  Service: Cardiovascular;  Laterality: N/A;  . PERCUTANEOUS CORONARY STENT INTERVENTION (PCI-S) Right 10/14/2012   Procedure: PERCUTANEOUS CORONARY STENT INTERVENTION (PCI-S);  Surgeon: Wellington Hampshire, MD;  Location: [facility] CATH LAB;  Service: Cardiovascular;  Laterality: Right;  . THYROIDECTOMY  2008  . TOTAL VAGINAL HYSTERECTOMY     Complete    Current Medications: Outpatient Medications Prior to Visit  Medication Sig Dispense Refill  . aspirin EC 81 MG tablet Take 81  mg by mouth daily.    Marland Kitchen atorvastatin (LIPITOR) 40 MG tablet Take 1 tablet (40 mg total) by mouth daily at 6 PM. 90 tablet 3  . Cholecalciferol (VITAMIN D PO) Take 1,000 Units by mouth daily.     . iron polysaccharides (FERREX 150) 150 MG capsule Take 150 mg by mouth 2 (two) times daily.     Marland Kitchen levothyroxine (SYNTHROID, LEVOTHROID) 25 MCG tablet Take 25 mcg by mouth daily before breakfast.    . metoprolol  tartrate (LOPRESSOR) 25 MG tablet Take 1 tablet (25 mg total) by mouth 2 (two) times daily. Please call to schedule appointment for further refills.  Thanks!  #2 120 tablet 0  . nitroGLYCERIN (NITROSTAT) 0.4 MG SL tablet Place 1 tablet (0.4 mg total) under the tongue every 5 (five) minutes as needed for chest pain. 25 tablet 3   No facility-administered medications prior to visit.      Allergies:   Patient has no known allergies.   Social History   Social History  . Marital status: Widowed    Spouse name: N/A  . Number of children: N/A  . Years of education: N/A   Occupational History  . Retired Astronomer [employer]   Social History Main Topics  . Smoking status: Never Smoker  . Smokeless tobacco: Never Used  . Alcohol use No  . Drug use: No  . Sexual activity: Not Currently   Other Topics Concern  . None   Social History Narrative   Pt lives alone, but has family close by.     Family History:  The patient's family history includes Diabetes in her sister.   Review of Systems:   Please see the history of present illness.     General:  No chills, fever, night sweats or weight changes. Positive for fatigue.  Cardiovascular:  No chest pain, dyspnea on exertion, edema, orthopnea, palpitations, paroxysmal nocturnal dyspnea. Dermatological: No rash, lesions/masses Respiratory: No cough, dyspnea Urologic: No hematuria, dysuria Abdominal:   No nausea, vomiting, diarrhea, bright red blood per rectum, melena, or hematemesis Neurologic:  No visual changes, wkns, changes in mental status. All other systems reviewed and are otherwise negative except as noted above.   Physical Exam:    VS:  BP (!) 142/78   Pulse (!) 44   Ht 5\' 5"  (1.651 m)   Wt 136 lb 9.6 oz (62 kg)   SpO2 97%   BMI 22.73 kg/m    General: Well developed, well nourished Elderly Caucasian female appearing in no acute distress. Head: Normocephalic, atraumatic, sclera non-icteric, no xanthomas, nares are without  discharge.  Neck: No carotid bruits. JVD not elevated.  Lungs: Respirations regular and unlabored, without wheezes or rales.  Heart: Irregularly irregular, bradycardiac rate. No S3 or S4.  No rubs, or gallops appreciated. 2/6 diastolic murmur along RUSB.  Abdomen: Soft, non-tender, non-distended with normoactive bowel sounds. No hepatomegaly. No rebound/guarding. No obvious abdominal masses. Msk:  Strength and tone appear normal for age. No joint deformities or effusions. Extremities: No clubbing or cyanosis. No lower extremity edema.  Distal pedal pulses are 2+ bilaterally. Neuro: Alert and oriented X 3. Moves all extremities spontaneously. No focal deficits noted. Psych:  Responds to questions appropriately with a normal affect. Skin: No rashes or lesions noted  Wt Readings from Last 3 Encounters:  08/06/16 136 lb 9.6 oz (62 kg)  07/19/16 131 lb 6.4 oz (59.6 kg)  07/11/15 137 lb 6.4 oz (62.3 kg)     Studies/Labs Reviewed:  EKG:  EKG is not ordered today.    Recent Labs: 07/17/2016: BUN 28; Creatinine, Ser 1.24; Hemoglobin 12.4; Platelets 161; Potassium 4.1; Sodium 138 07/18/2016: TSH 2.779   Lipid Panel    Component Value Date/Time   CHOL 146 12/15/2012 1552   TRIG 54.0 12/15/2012 1552   HDL 64.10 12/15/2012 1552   CHOLHDL 2 12/15/2012 1552   VLDL 10.8 12/15/2012 1552   LDLCALC 71 12/15/2012 1552    Additional studies/ records that were reviewed today include:   Echocardiogram: 09/2012 Study Conclusions  - Left ventricle: The cavity size was moderately dilated. Wall thickness was increased in a pattern of mild LVH. The estimated ejection fraction was 40%. Diffuse hypokinesis. - Aortic valve: Cannot tell if valve is tri leaflet or not Mild regurgitation. - Mitral valve: Mild regurgitation. - Left atrium: The atrium was mildly dilated. - Right atrium: The atrium was mildly dilated. - Atrial septum: No defect or patent foramen ovale was identified. -  Impressions: Cannot r/o thrombus in IVC on limited subcostal window Impressions:  - Cannot r/o thrombus in IVC on limited subcostal window  Assessment:    1. Coronary artery disease involving native coronary artery of native heart without angina pectoris   2. Paroxysmal atrial fibrillation (HCC)   3. Aortic valve insufficiency, etiology of cardiac valve disease unspecified   4. LV dysfunction   5. Hypothyroidism, unspecified type   6. CKD (chronic kidney disease) stage 3, GFR 30-59 ml/min      Plan:   In order of problems listed above:  1. CAD - s/p BMS to RCA in 2014. Recently admitted to [facility] for evaluation of chest pain which was atypical in that it was worse with bending over. Cyclic troponin values remained negative and her EKG showed no acute ischemic changes. Lopressor was increased as it was thought her symptoms might be rate-related.  - she denies any recurrent chest pain or dyspnea on exertion.  - with the increased Lopressor dosing, she has become bradycardiac with a HR in the 40's.  Will reduce Lopressor dosing from 25mg  BID to 12.5mg  BID.  - continue ASA and statin therapy. Reviewed the utilization of SL NTG with the patient and will send in a new Rx for this as her current NTG has expired.   2. Paroxysmal Atrial Fibrillation - This patients CHA2DS2-VASc Score and unadjusted Ischemic Stroke Rate (% per year) is equal to 9.7 % stroke rate/year from a score of 6 (CHF, HTN, Vascular, Female, Age (2)). She is not on anticoagulation secondary to a history of GI bleeding and her advanced age. Continue ASA 81 mg daily. - She is bradycardic with a heart rate of 44 (increasing into 50's with ambulation) by examination today. Will reduce Lopressor from 25mg  BID to 12.5mg  BID as she denies any recurrent palpitations. She can take an additional 12.5mg  if needed for palpitations.   3. Mild Aortic Insufficiency/ LV dysfunction - mild by echo in 09/2012. EF 40% at that time with  medical management favored in the setting of her advanced age.  - she denies any recent lightheadedness, dizziness, or presyncope. Would not repeat an echo at this time as she is asymptomatic and it would not change the management of her diagnoses.   4. Hypothyroidism - remains on Synthroid 25 mcg daily.  - followed by PCP.   5. Stage 3 CKD - creatinine at 1.24 on 07/17/2016. Close to baseline.   Medication Adjustments/Labs and Tests Ordered: Current medicines are reviewed at length with  the patient today.  Concerns regarding medicines are outlined above.  Medication changes, Labs and Tests ordered today are listed in the Patient Instructions below. Patient Instructions  Medication Instructions: Your Metoprolol has been decreased to 12.5 mg twice daily (half a tablet). You may take an additional half tablet for palpitations.  Follow-Up: Your physician wants you to follow-up in: 6 months with Dr. Martinique. You will receive a reminder letter in the mail two months in advance. If you don't receive a letter, please call our office to schedule the follow-up appointment.  If you need a refill on your cardiac medications before your next appointment, please call your pharmacy.    Signed, Erma Heritage, PA-C  08/06/2016 11:20 AM    [place], [address], [place]  [address] Phone: [phone]; Fax: [phone]  [address], [place], [place] [address] Phone: [phone]

## 2016-08-06 ENCOUNTER — Ambulatory Visit (INDEPENDENT_AMBULATORY_CARE_PROVIDER_SITE_OTHER): Payer: Medicare Other | Admitting: Student

## 2016-08-06 ENCOUNTER — Encounter: Payer: Self-pay | Admitting: Student

## 2016-08-06 VITALS — BP 142/78 | HR 44 | Ht 65.0 in | Wt 136.6 lb

## 2016-08-06 DIAGNOSIS — I351 Nonrheumatic aortic (valve) insufficiency: Secondary | ICD-10-CM

## 2016-08-06 DIAGNOSIS — N183 Chronic kidney disease, stage 3 unspecified: Secondary | ICD-10-CM

## 2016-08-06 DIAGNOSIS — I519 Heart disease, unspecified: Secondary | ICD-10-CM | POA: Diagnosis not present

## 2016-08-06 DIAGNOSIS — I480 Paroxysmal atrial fibrillation: Secondary | ICD-10-CM

## 2016-08-06 DIAGNOSIS — E039 Hypothyroidism, unspecified: Secondary | ICD-10-CM | POA: Diagnosis not present

## 2016-08-06 DIAGNOSIS — I2510 Atherosclerotic heart disease of native coronary artery without angina pectoris: Secondary | ICD-10-CM

## 2016-08-06 MED ORDER — NITROGLYCERIN 0.4 MG SL SUBL: 0.4000 mg | SUBLINGUAL_TABLET | SUBLINGUAL | 3 refills | Status: DC | PRN

## 2016-08-06 MED ORDER — METOPROLOL TARTRATE 25 MG PO TABS: 12.5000 mg | ORAL_TABLET | Freq: Two times a day (BID) | ORAL | 3 refills | Status: DC

## 2016-08-06 NOTE — Patient Instructions (Signed)
Medication Instructions: Your Metoprolol has been decreased to 12.5 mg twice daily (half a tablet). You may take an additional half tablet for palpitations.   Follow-Up: Your physician wants you to follow-up in: 6 months with Dr. Martinique. You will receive a reminder letter in the mail two months in advance. If you don't receive a letter, please call our office to schedule the follow-up appointment.    If you need a refill on your cardiac medications before your next appointment, please call your pharmacy.

## 2016-08-17 DIAGNOSIS — I129 Hypertensive chronic kidney disease with stage 1 through stage 4 chronic kidney disease, or unspecified chronic kidney disease: Secondary | ICD-10-CM | POA: Diagnosis not present

## 2016-08-17 DIAGNOSIS — Z6824 Body mass index (BMI) 24.0-24.9, adult: Secondary | ICD-10-CM | POA: Diagnosis not present

## 2016-08-17 DIAGNOSIS — R0789 Other chest pain: Secondary | ICD-10-CM | POA: Diagnosis not present

## 2016-08-17 DIAGNOSIS — I4891 Unspecified atrial fibrillation: Secondary | ICD-10-CM | POA: Diagnosis not present

## 2016-08-28 ENCOUNTER — Other Ambulatory Visit: Payer: Self-pay

## 2016-08-28 MED ORDER — ATORVASTATIN CALCIUM 40 MG PO TABS: 40.0000 mg | ORAL_TABLET | Freq: Every day | ORAL | 3 refills | Status: DC

## 2016-12-19 DIAGNOSIS — E559 Vitamin D deficiency, unspecified: Secondary | ICD-10-CM | POA: Diagnosis not present

## 2016-12-19 DIAGNOSIS — I10 Essential (primary) hypertension: Secondary | ICD-10-CM | POA: Diagnosis not present

## 2016-12-19 DIAGNOSIS — E038 Other specified hypothyroidism: Secondary | ICD-10-CM | POA: Diagnosis not present

## 2016-12-25 DIAGNOSIS — D6489 Other specified anemias: Secondary | ICD-10-CM | POA: Diagnosis not present

## 2016-12-25 DIAGNOSIS — N183 Chronic kidney disease, stage 3 (moderate): Secondary | ICD-10-CM | POA: Diagnosis not present

## 2016-12-25 DIAGNOSIS — I420 Dilated cardiomyopathy: Secondary | ICD-10-CM | POA: Diagnosis not present

## 2016-12-25 DIAGNOSIS — I4891 Unspecified atrial fibrillation: Secondary | ICD-10-CM | POA: Diagnosis not present

## 2016-12-25 DIAGNOSIS — E784 Other hyperlipidemia: Secondary | ICD-10-CM | POA: Diagnosis not present

## 2016-12-25 DIAGNOSIS — E038 Other specified hypothyroidism: Secondary | ICD-10-CM | POA: Diagnosis not present

## 2016-12-25 DIAGNOSIS — I10 Essential (primary) hypertension: Secondary | ICD-10-CM | POA: Diagnosis not present

## 2016-12-25 DIAGNOSIS — E559 Vitamin D deficiency, unspecified: Secondary | ICD-10-CM | POA: Diagnosis not present

## 2016-12-25 DIAGNOSIS — I5022 Chronic systolic (congestive) heart failure: Secondary | ICD-10-CM | POA: Diagnosis not present

## 2016-12-25 DIAGNOSIS — Z0000 Encounter for general adult medical examination without abnormal findings: Secondary | ICD-10-CM | POA: Diagnosis not present

## 2016-12-25 DIAGNOSIS — Z1389 Encounter for screening for other disorder: Secondary | ICD-10-CM | POA: Diagnosis not present

## 2016-12-25 DIAGNOSIS — Z23 Encounter for immunization: Secondary | ICD-10-CM | POA: Diagnosis not present

## 2017-02-01 ENCOUNTER — Emergency Department (HOSPITAL_COMMUNITY): Payer: Medicare Other

## 2017-02-01 ENCOUNTER — Inpatient Hospital Stay (HOSPITAL_COMMUNITY)
Admission: EM | Admit: 2017-02-01 | Discharge: 2017-02-03 | DRG: 291 | Disposition: A | Discharge status: Home / Self Care | Payer: Medicare Other | Attending: Internal Medicine | Admitting: Internal Medicine

## 2017-02-01 ENCOUNTER — Encounter (HOSPITAL_COMMUNITY): Payer: Self-pay

## 2017-02-01 DIAGNOSIS — N183 Chronic kidney disease, stage 3 unspecified: Secondary | ICD-10-CM | POA: Diagnosis present

## 2017-02-01 DIAGNOSIS — I4891 Unspecified atrial fibrillation: Secondary | ICD-10-CM | POA: Diagnosis not present

## 2017-02-01 DIAGNOSIS — I248 Other forms of acute ischemic heart disease: Secondary | ICD-10-CM | POA: Diagnosis present

## 2017-02-01 DIAGNOSIS — I420 Dilated cardiomyopathy: Secondary | ICD-10-CM | POA: Diagnosis present

## 2017-02-01 DIAGNOSIS — R0602 Shortness of breath: Secondary | ICD-10-CM | POA: Diagnosis not present

## 2017-02-01 DIAGNOSIS — Z79899 Other long term (current) drug therapy: Secondary | ICD-10-CM | POA: Diagnosis not present

## 2017-02-01 DIAGNOSIS — E785 Hyperlipidemia, unspecified: Secondary | ICD-10-CM | POA: Diagnosis not present

## 2017-02-01 DIAGNOSIS — D649 Anemia, unspecified: Secondary | ICD-10-CM | POA: Diagnosis present

## 2017-02-01 DIAGNOSIS — E876 Hypokalemia: Secondary | ICD-10-CM | POA: Diagnosis not present

## 2017-02-01 DIAGNOSIS — I480 Paroxysmal atrial fibrillation: Secondary | ICD-10-CM | POA: Diagnosis present

## 2017-02-01 DIAGNOSIS — I10 Essential (primary) hypertension: Secondary | ICD-10-CM | POA: Diagnosis present

## 2017-02-01 DIAGNOSIS — N179 Acute kidney failure, unspecified: Secondary | ICD-10-CM | POA: Diagnosis not present

## 2017-02-01 DIAGNOSIS — Z7982 Long term (current) use of aspirin: Secondary | ICD-10-CM

## 2017-02-01 DIAGNOSIS — E039 Hypothyroidism, unspecified: Secondary | ICD-10-CM | POA: Diagnosis not present

## 2017-02-01 DIAGNOSIS — I509 Heart failure, unspecified: Secondary | ICD-10-CM | POA: Diagnosis not present

## 2017-02-01 DIAGNOSIS — I2510 Atherosclerotic heart disease of native coronary artery without angina pectoris: Secondary | ICD-10-CM | POA: Diagnosis present

## 2017-02-01 DIAGNOSIS — I4510 Unspecified right bundle-branch block: Secondary | ICD-10-CM | POA: Diagnosis present

## 2017-02-01 DIAGNOSIS — Z955 Presence of coronary angioplasty implant and graft: Secondary | ICD-10-CM | POA: Diagnosis not present

## 2017-02-01 DIAGNOSIS — R0902 Hypoxemia: Secondary | ICD-10-CM | POA: Diagnosis not present

## 2017-02-01 DIAGNOSIS — I351 Nonrheumatic aortic (valve) insufficiency: Secondary | ICD-10-CM | POA: Diagnosis present

## 2017-02-01 DIAGNOSIS — E890 Postprocedural hypothyroidism: Secondary | ICD-10-CM | POA: Diagnosis present

## 2017-02-01 DIAGNOSIS — N189 Chronic kidney disease, unspecified: Secondary | ICD-10-CM | POA: Diagnosis not present

## 2017-02-01 DIAGNOSIS — I5023 Acute on chronic systolic (congestive) heart failure: Secondary | ICD-10-CM | POA: Diagnosis present

## 2017-02-01 DIAGNOSIS — I130 Hypertensive heart and chronic kidney disease with heart failure and stage 1 through stage 4 chronic kidney disease, or unspecified chronic kidney disease: Secondary | ICD-10-CM | POA: Diagnosis not present

## 2017-02-01 DIAGNOSIS — I252 Old myocardial infarction: Secondary | ICD-10-CM | POA: Diagnosis not present

## 2017-02-01 HISTORY — DX: Unspecified osteoarthritis, unspecified site: M19.90

## 2017-02-01 LAB — TROPONIN I: Troponin I: 0.03 ng/mL (ref <0.03)

## 2017-02-01 LAB — COMPREHENSIVE METABOLIC PANEL
ALBUMIN: 3.8 g/dL (ref 3.5–5.0)
ALT: 31 U/L (ref 14–54)
AST: 28 U/L (ref 15–41)
Alkaline Phosphatase: 78 U/L (ref 38–126)
Anion gap: 8 (ref 5–15)
BILIRUBIN TOTAL: 1 mg/dL (ref 0.3–1.2)
BUN: 19 mg/dL (ref 6–20)
CO2: 23 mmol/L (ref 22–32)
Calcium: 9.3 mg/dL (ref 8.9–10.3)
Chloride: 109 mmol/L (ref 101–111)
Creatinine, Ser: 1.18 mg/dL — ABNORMAL HIGH (ref 0.44–1.00)
GFR calc Af Amer: 44 mL/min — ABNORMAL LOW (ref >60)
GFR calc non Af Amer: 38 mL/min — ABNORMAL LOW (ref >60)
GLUCOSE: 114 mg/dL — AB (ref 65–99)
POTASSIUM: 4.9 mmol/L (ref 3.5–5.1)
SODIUM: 140 mmol/L (ref 135–145)
TOTAL PROTEIN: 6.9 g/dL (ref 6.5–8.1)

## 2017-02-01 LAB — CBC
HEMATOCRIT: 34.2 % — AB (ref 36.0–46.0)
Hemoglobin: 10.9 g/dL — ABNORMAL LOW (ref 12.0–15.0)
MCH: 29.3 pg (ref 26.0–34.0)
MCHC: 31.9 g/dL (ref 30.0–36.0)
MCV: 91.9 fL (ref 78.0–100.0)
Platelets: 153 10*3/uL (ref 150–400)
RBC: 3.72 MIL/uL — ABNORMAL LOW (ref 3.87–5.11)
RDW: 15.1 % (ref 11.5–15.5)
WBC: 4.3 10*3/uL (ref 4.0–10.5)

## 2017-02-01 LAB — I-STAT TROPONIN, ED: Troponin i, poc: 0.03 ng/mL (ref 0.00–0.08)

## 2017-02-01 LAB — BRAIN NATRIURETIC PEPTIDE: B Natriuretic Peptide: 942.7 pg/mL — ABNORMAL HIGH (ref 0.0–100.0)

## 2017-02-01 MED ORDER — ATORVASTATIN CALCIUM 40 MG PO TABS
40.0000 mg | ORAL_TABLET | Freq: Every day | ORAL | Status: DC
  Administered 2017-02-02: 40 mg via ORAL
  Filled 2017-02-01: qty 1

## 2017-02-01 MED ORDER — ASPIRIN EC 81 MG PO TBEC
81.0000 mg | DELAYED_RELEASE_TABLET | Freq: Every day | ORAL | Status: DC
  Administered 2017-02-02 – 2017-02-03 (×2): 81 mg via ORAL
  Filled 2017-02-01 (×2): qty 1

## 2017-02-01 MED ORDER — VITAMIN B-12 100 MCG PO TABS
100.0000 ug | ORAL_TABLET | Freq: Every day | ORAL | Status: DC
  Administered 2017-02-02 – 2017-02-03 (×2): 100 ug via ORAL
  Filled 2017-02-01 (×2): qty 1

## 2017-02-01 MED ORDER — FUROSEMIDE 10 MG/ML IJ SOLN
40.0000 mg | Freq: Every day | INTRAMUSCULAR | Status: DC
  Administered 2017-02-02: 40 mg via INTRAVENOUS
  Filled 2017-02-01: qty 4

## 2017-02-01 MED ORDER — SODIUM CHLORIDE 0.9% FLUSH
3.0000 mL | Freq: Two times a day (BID) | INTRAVENOUS | Status: DC
  Administered 2017-02-01 – 2017-02-03 (×4): 3 mL via INTRAVENOUS

## 2017-02-01 MED ORDER — ALBUTEROL SULFATE (2.5 MG/3ML) 0.083% IN NEBU: 2.5000 mg | INHALATION_SOLUTION | RESPIRATORY_TRACT | Status: DC | PRN

## 2017-02-01 MED ORDER — SODIUM CHLORIDE 0.9 % IV SOLN: 250.0000 mL | INTRAVENOUS | Status: DC | PRN

## 2017-02-01 MED ORDER — NEPRO/CARBSTEADY PO LIQD
237.0000 mL | Freq: Two times a day (BID) | ORAL | Status: DC
  Administered 2017-02-02: 237 mL via ORAL
  Filled 2017-02-01 (×6): qty 237

## 2017-02-01 MED ORDER — FUROSEMIDE 10 MG/ML IJ SOLN
40.0000 mg | Freq: Once | INTRAMUSCULAR | Status: AC
  Administered 2017-02-01: 40 mg via INTRAVENOUS
  Filled 2017-02-01: qty 4

## 2017-02-01 MED ORDER — METOPROLOL TARTRATE 12.5 MG HALF TABLET
12.5000 mg | ORAL_TABLET | Freq: Two times a day (BID) | ORAL | Status: DC
  Administered 2017-02-01 – 2017-02-03 (×4): 12.5 mg via ORAL
  Filled 2017-02-01 (×4): qty 1

## 2017-02-01 MED ORDER — ZOLPIDEM TARTRATE 5 MG PO TABS: 5.0000 mg | ORAL_TABLET | Freq: Every evening | ORAL | Status: DC | PRN

## 2017-02-01 MED ORDER — POLYSACCHARIDE IRON COMPLEX 150 MG PO CAPS
150.0000 mg | ORAL_CAPSULE | Freq: Two times a day (BID) | ORAL | Status: DC
  Administered 2017-02-01 – 2017-02-03 (×4): 150 mg via ORAL
  Filled 2017-02-01 (×5): qty 1

## 2017-02-01 MED ORDER — SODIUM CHLORIDE 0.9% FLUSH: 3.0000 mL | INTRAVENOUS | Status: DC | PRN

## 2017-02-01 MED ORDER — NITROGLYCERIN 0.4 MG SL SUBL: 0.4000 mg | SUBLINGUAL_TABLET | SUBLINGUAL | Status: DC | PRN

## 2017-02-01 MED ORDER — HYDROXYZINE HCL 10 MG PO TABS: 10.0000 mg | ORAL_TABLET | Freq: Three times a day (TID) | ORAL | Status: DC | PRN

## 2017-02-01 MED ORDER — HYDRALAZINE HCL 20 MG/ML IJ SOLN: 5.0000 mg | INTRAMUSCULAR | Status: DC | PRN

## 2017-02-01 MED ORDER — METOPROLOL TARTRATE 5 MG/5ML IV SOLN
5.0000 mg | Freq: Once | INTRAVENOUS | Status: AC
  Administered 2017-02-01: 5 mg via INTRAVENOUS
  Filled 2017-02-01: qty 5

## 2017-02-01 MED ORDER — VITAMIN D 1000 UNITS PO TABS
1000.0000 [IU] | ORAL_TABLET | Freq: Every day | ORAL | Status: DC
  Administered 2017-02-02 – 2017-02-03 (×2): 1000 [IU] via ORAL
  Filled 2017-02-01 (×2): qty 1

## 2017-02-01 MED ORDER — ENOXAPARIN SODIUM 40 MG/0.4ML ~~LOC~~ SOLN
40.0000 mg | SUBCUTANEOUS | Status: DC
  Administered 2017-02-01: 40 mg via SUBCUTANEOUS
  Filled 2017-02-01: qty 0.4

## 2017-02-01 MED ORDER — LEVOTHYROXINE SODIUM 25 MCG PO TABS
25.0000 ug | ORAL_TABLET | Freq: Every day | ORAL | Status: DC
  Administered 2017-02-02 – 2017-02-03 (×2): 25 ug via ORAL
  Filled 2017-02-01 (×2): qty 1

## 2017-02-01 MED ORDER — ACETAMINOPHEN 325 MG PO TABS: 650.0000 mg | ORAL_TABLET | ORAL | Status: DC | PRN

## 2017-02-01 NOTE — Progress Notes (Signed)
New Admission Note:   Arrival Method: ED bed, ambulated to bathroom Mental Orientation: A&O x4 Telemetry: initiated, verified, a.fib Skin: BLE edema, buttocks slightly red Pain: denies Safety Measures: implemented  Orders to be reviewed and implemented. Will continue to monitor the patient. Call light has been placed within reach and bed alarm has been activated.   Riki Altes, RN Phone: [phone]

## 2017-02-01 NOTE — ED Triage Notes (Signed)
Pt arrived via [facility] from home c/o SOB and A-fib.  Pt states she doesn't have a history of A-fib.  Pt states she feels like she has something in her throat and is unable to take a deep breath.

## 2017-02-01 NOTE — ED Notes (Signed)
Patient transported to X-ray 

## 2017-02-01 NOTE — H&P (Addendum)
History and Physical    Julie Lara OVF:[id number] DOB: 05-26-1920 DOA: 02/01/2017  Referring MD/NP/PA:   PCP: Burnard Bunting, MD   Patient coming from:  The patient is coming from home.  At baseline, pt is independent for most of ADL.   Chief Complaint: Shortness of breath  HPI: Julie Lara is a 83 y.o. female with medical history significant of sCHF with EF of 40%, atrial fibrillation not on anticoagulants, GI bleeding, essential hypertension, hyperlipidemia, hypothyroidism, CAD, stent placement, anemia, aortic insufficiency, CK-3, who presents with shortness of breath.  Patient states that she has been having shortness of breath in the past several days, which has been progressively getting worse. She does not have cough, fever or chills. No chest pain. She denies nausea, vomiting, diarrhea, abdominal pain, symptoms of UTI or unilateral weakness. She has a bilateral leg edema. She is not sure if she has gained weight recently.  ED Course: pt was found to have BNP 942.7, negative troponin, WBC 4.3, stable renal function, temperature normal, tachycardia, tachypnea, oxygen saturation 94% on room air, chest x-ray finding is consistent with pulmonary edema. Patient is admitted to telemetry bed as inpatient.  Review of Systems:   General: no fevers, chills, no body weight gain, has fatigue HEENT: no blurry vision, hearing changes or sore throat Respiratory: has dyspnea, no coughing, wheezing CV: no chest pain, no palpitations GI: no nausea, vomiting, abdominal pain, diarrhea, constipation GU: no dysuria, burning on urination, increased urinary frequency, hematuria  Ext: has leg edema Neuro: no unilateral weakness, numbness, or tingling, no vision change or hearing loss Skin: no rash, no skin tear. MSK: No muscle spasm, no deformity, no limitation of range of movement in spin Heme: No easy bruising.  Travel history: No recent long distant travel.  Allergy: No Known  Allergies  Past Medical History:  Diagnosis Date  . Acute blood loss anemia    November 2014  . Aortic insufficiency    mild to moderate  . Arthritis    "right upper arm" (02/01/2017)  . Atrial fibrillation ([place])   . CAD (coronary artery disease)   . Chest pain 04-2010   atypical with negative echocardiogram  . Dilated cardiomyopathy ([place])   . Goiter   . Hypothyroidism   . Idiopathic cardiomyopathy ([place]) 1999   EF 40% by Echo on 10/13/12  . NSTEMI (non-ST elevated myocardial infarction) [facility]) July 2014   95% prox RCA s/p BMS  . PAC (premature atrial contraction)    Chronic    Past Surgical History:  Procedure Laterality Date  . biopsy of sacral lesion  2010  . CARDIAC CATHETERIZATION    . CORONARY ANGIOPLASTY WITH STENT PLACEMENT  09/2012   BMS-95% prox RCA  . THYROIDECTOMY  2008  . VAGINAL HYSTERECTOMY     Complete    Social History:  reports that  has never smoked. she has never used smokeless tobacco. She reports that she does not drink alcohol or use drugs.  Family History:  Family History  Problem Relation Age of Onset  . Diabetes Sister      Prior to Admission medications   Medication Sig Start Date End Date Taking? Authorizing Provider  aspirin EC 81 MG tablet Take 81 mg by mouth daily.   Yes [provider]  nitroGLYCERIN (NITROSTAT) 0.4 MG SL tablet Place 1 tablet (0.4 mg total) under the tongue every 5 (five) minutes as needed for chest pain. 08/06/16  Yes Strader, [place], PA-C  atorvastatin (LIPITOR) 40  MG tablet Take 1 tablet (40 mg total) by mouth daily at 6 PM. 08/28/16   Martinique, Peter M, MD  Cholecalciferol (VITAMIN D PO) Take 1,000 Units by mouth daily.     [provider]  iron polysaccharides (FERREX 150) 150 MG capsule Take 150 mg by mouth 2 (two) times daily.     [provider]  levothyroxine (SYNTHROID, LEVOTHROID) 25 MCG tablet Take 25 mcg by mouth daily before breakfast.    [provider]  metoprolol  tartrate (LOPRESSOR) 25 MG tablet Take 0.5 tablets (12.5 mg total) by mouth 2 (two) times daily. 08/06/16   Erma Heritage, PA-C    Physical Exam: Vitals:   02/03/17 0019 02/03/17 0513 02/03/17 0927 02/03/17 1132  BP: (!) 142/79 119/78 132/85 (!) 133/92  Pulse: 93 (!) 108 (!) 104 89  Resp: 18 18  18   Temp: 98.1 F (36.7 C) 98.5 F (36.9 C)  98 F (36.7 C)  TempSrc: Oral Tympanic  Oral  SpO2: 95% 95% 97% 98%  Weight:  56.9 kg (125 lb 8 oz)    Height:       General: Not in acute distress HEENT:       Eyes: PERRL, EOMI, no scleral icterus.       ENT: No discharge from the ears and nose, no pharynx injection, no tonsillar enlargement.        Neck: 1+ JVD, no bruit, no mass felt. Heme: No neck lymph node enlargement. Cardiac: S1/S2, RRR, No murmurs, No gallops or rubs. Respiratory: No rales, wheezing, rhonchi or rubs. GI: Soft, nondistended, nontender, no rebound pain, no organomegaly, BS present. GU: No hematuria Ext: 1+ pitting leg edema bilaterally. 2+DP/PT pulse bilaterally. Musculoskeletal: No joint deformities, No joint redness or warmth, no limitation of ROM in spin. Skin: No rashes.  Neuro: Alert, oriented X3, cranial nerves II-XII grossly intact, moves all extremities normally. Psych: Patient is not psychotic, no suicidal or hemocidal ideation.  Labs on Admission: I have personally reviewed following labs and imaging studies  CBC: No results for input(s): WBC, NEUTROABS, HGB, HCT, MCV, PLT in the last 168 hours. Basic Metabolic Panel: No results for input(s): NA, K, CL, CO2, GLUCOSE, BUN, CREATININE, CALCIUM, MG, PHOS in the last 168 hours. GFR: Estimated Creatinine Clearance: 19.2 mL/min (A) (by C-G formula based on SCr of 1.35 mg/dL (H)). Liver Function Tests: No results for input(s): AST, ALT, ALKPHOS, BILITOT, PROT, ALBUMIN in the last 168 hours. No results for input(s): LIPASE, AMYLASE in the last 168 hours. No results for input(s): AMMONIA in the last 168  hours. Coagulation Profile: No results for input(s): INR, PROTIME in the last 168 hours. Cardiac Enzymes: No results for input(s): CKTOTAL, CKMB, CKMBINDEX, TROPONINI in the last 168 hours. BNP (last 3 results) No results for input(s): PROBNP in the last 8760 hours. HbA1C: No results for input(s): HGBA1C in the last 72 hours. CBG: No results for input(s): GLUCAP in the last 168 hours. Lipid Profile: No results for input(s): CHOL, HDL, LDLCALC, TRIG, CHOLHDL, LDLDIRECT in the last 72 hours. Thyroid Function Tests: No results for input(s): TSH, T4TOTAL, FREET4, T3FREE, THYROIDAB in the last 72 hours. Anemia Panel: No results for input(s): VITAMINB12, FOLATE, FERRITIN, TIBC, IRON, RETICCTPCT in the last 72 hours. Urine analysis:    Component Value Date/Time   COLORURINE YELLOW 10/14/2012 2010   APPEARANCEUR HAZY (A) 10/14/2012 2010   LABSPEC 1.017 10/14/2012 2010   PHURINE 5.5 10/14/2012 2010   [place] NEGATIVE 10/14/2012 2010  HGBUR TRACE (A) 10/14/2012 2010   BILIRUBINUR NEGATIVE 10/14/2012 2010   [place] NEGATIVE 10/14/2012 2010   PROTEINUR NEGATIVE 10/14/2012 2010   UROBILINOGEN 0.2 10/14/2012 2010   NITRITE POSITIVE (A) 10/14/2012 2010   LEUKOCYTESUR MODERATE (A) 10/14/2012 2010   Sepsis Labs: @LABRCNTIP (procalcitonin:4,lacticidven:4) )No results found for this or any previous visit (from the past 240 hour(s)).   Radiological Exams on Admission: No results found.   EKG: Independently reviewed.  . Atrial fibrillation, QTC 530, LAD, anteroseptal infarction pattern   Assessment/Plan Principal Problem:   Acute on chronic systolic CHF (congestive heart failure) (HCC) Active Problems:   Hypothyroidism   Atrial fibrillation (HCC)   CKD (chronic kidney disease), stage III (HCC)   Anemia   CAD (coronary artery disease)   HLD (hyperlipidemia)   Essential hypertension   Acute on chronic systolic CHF (congestive heart failure) [facility]): Patient has shortness breath,  bilateral leg edema, positive JVD, elevated BNP, plus chest x-ray finding of pulmonary edema, consistent with CHF exacerbation. 2-D echo on 10/13/12 showed EF of 40%.  -will admit to tele bed as inpt. -Lasix 40 mg daily by IV -trop x 3 -2d echo -will continue home metoprolol, ASA -Daily weights -strict I/O's -Low salt diet  Atrial Fibrillation: CHA2DS2-VASc Score is 5, needs oral anticoagulation, but pt is not on AC, possibly due to history of GI bleeding. Initially patient has A. fib with RVR, which has improved after treated with one dose of metoprolol.  -continue metoprolol  Hypothyroidism: Last TSH was 2.779 on 07/18/16 -Continue home Synthroid  CKD (chronic kidney disease), stage III ([place]): Stable. Baseline creatinine 1.2-1.3. Her creatinine is 1.18 on admission. -Follow-up renal function by BMP  Anemia: Hemoglobin stable, 10.9 on admission. -Follow-up by CBC -Continue vitamin B12 and iron supplement  Hx of  CAD (coronary artery disease): s/p of stent. No CP. -Continue aspirin, Lipitor, metoprolol -When necessary nitroglycerin  Hyperlipidemia: -Lipitor  Essential hypertension: -continue metoprolol  DVT ppx: SQ Lovenox Code Status: Full code Family Communication: None at bed side.    Disposition Plan:  Anticipate discharge back to previous home environment Consults called:  none Admission status:  Inpatient/tele     Date of Service 02/11/2017    Ivor Costa [facility] Pager [phone]  If 7PM-7AM, please contact night-coverage [email or web address] Password [facility] 02/11/2017, 5:31 PM

## 2017-02-01 NOTE — ED Notes (Signed)
Purewick cathter applied  

## 2017-02-01 NOTE — ED Provider Notes (Signed)
[place] EMERGENCY DEPARTMENT Provider Note   CSN: [id number] Arrival date & time: 02/01/17  1605     History   Chief Complaint Chief Complaint  Patient presents with  . Shortness of Breath  . Atrial Fibrillation    HPI Julie Lara is a 83 y.o. female.  Patient with a history of A. Fib, CHF, CAD (s/p MI, PCI w/ BMS), CKD, and Hypothyroidism presenting with Shortness of breath and atrial fibrillation. She states that she began to feel short of breath last night. She felt short of breath and like she could not move air well when breathing normally, This was improved when she would take deeper breaths. She states that her shortness of breath was worse when laying down. She and her family state that she is not always in A. Fib and in not on anticoagulation.       Past Medical History:  Diagnosis Date  . Acute blood loss anemia    November 2014  . Aortic insufficiency    mild to moderate  . Atrial fibrillation ([place])   . CAD (coronary artery disease)   . Chest pain 04-2010   atypical with negative echocardiogram  . Dilated cardiomyopathy ([place])   . Goiter   . History of thyroidectomy   . Hypothyroidism   . Idiopathic cardiomyopathy ([place]) 1999   EF 40% by Echo on 10/13/12  . NSTEMI (non-ST elevated myocardial infarction) [facility]) July 2014   95% prox RCA s/p BMS  . PAC (premature atrial contraction)    Chronic    Patient Active Problem List   Diagnosis Date Noted  . Chest pain, rule out acute myocardial infarction 07/18/2016  . Acute blood loss anemia 03/12/2013  . CAD (coronary artery disease)   . Benign neoplasm of colon 02/23/2013  . Nonspecific abnormal finding in stool contents 02/23/2013  . Near syncope 02/21/2013  . Anemia 02/21/2013  . Acute on chronic renal failure ([place]) 02/21/2013  . Chronic systolic congestive heart failure ([place]) 02/21/2013  . GI bleed 02/21/2013  . Chronic anticoagulation 02/21/2013  . Coronary artery disease with  history of myocardial infarction without history of CABG 02/21/2013  . Resting chest pain 10/27/2012  . Hypothyroidism 10/15/2012  . Atrial fibrillation ([place]) 10/15/2012  . CKD (chronic kidney disease), stage III ([place]) 10/15/2012  . UTI (urinary tract infection) 10/15/2012  . Dilated cardiomyopathy ([place])   . Aortic insufficiency   . PAC (premature atrial contraction)   . Chest pain 04/02/2010    Past Surgical History:  Procedure Laterality Date  . biopsy of sacral lesion  2010  . CARDIAC CATHETERIZATION    . COLONOSCOPY N/A 02/23/2013   Procedure: COLONOSCOPY;  Surgeon: Ladene Artist, MD;  Location: [facility] ENDOSCOPY;  Service: Endoscopy;  Laterality: N/A;  . CORONARY ANGIOPLASTY WITH STENT PLACEMENT     BMS-95% prox RCA  . ESOPHAGOGASTRODUODENOSCOPY N/A 02/22/2013   Procedure: ESOPHAGOGASTRODUODENOSCOPY (EGD);  Surgeon: Milus Banister, MD;  Location: [place];  Service: Endoscopy;  Laterality: N/A;  . LEFT HEART CATHETERIZATION WITH CORONARY ANGIOGRAM N/A 10/14/2012   Procedure: LEFT HEART CATHETERIZATION WITH CORONARY ANGIOGRAM;  Surgeon: Wellington Hampshire, MD;  Location: [facility] CATH LAB;  Service: Cardiovascular;  Laterality: N/A;  . PERCUTANEOUS CORONARY STENT INTERVENTION (PCI-S) Right 10/14/2012   Procedure: PERCUTANEOUS CORONARY STENT INTERVENTION (PCI-S);  Surgeon: Wellington Hampshire, MD;  Location[facility] CATH LAB;  Service: Cardiovascular;  Laterality: Right;  . THYROIDECTOMY  2008  . TOTAL VAGINAL HYSTERECTOMY  Complete    OB History    No data available       Home Medications    Prior to Admission medications   Medication Sig Start Date End Date Taking? Authorizing Provider  aspirin EC 81 MG tablet Take 81 mg by mouth daily.   Yes [provider]  nitroGLYCERIN (NITROSTAT) 0.4 MG SL tablet Place 1 tablet (0.4 mg total) under the tongue every 5 (five) minutes as needed for chest pain. 08/06/16  Yes Strader, [place] M, PA-C  atorvastatin (LIPITOR) 40 MG tablet  Take 1 tablet (40 mg total) by mouth daily at 6 PM. 08/28/16   Martinique, Peter M, MD  Cholecalciferol (VITAMIN D PO) Take 1,000 Units by mouth daily.     [provider]  iron polysaccharides (FERREX 150) 150 MG capsule Take 150 mg by mouth 2 (two) times daily.     [provider]  levothyroxine (SYNTHROID, LEVOTHROID) 25 MCG tablet Take 25 mcg by mouth daily before breakfast.    [provider]  metoprolol tartrate (LOPRESSOR) 25 MG tablet Take 0.5 tablets (12.5 mg total) by mouth 2 (two) times daily. 08/06/16   Erma Heritage, PA-C    Family History Family History  Problem Relation Age of Onset  . Diabetes Sister     Social History Social History  Substance Use Topics  . Smoking status: Never Smoker  . Smokeless tobacco: Never Used  . Alcohol use No     Allergies   Patient has no known allergies.   Review of Systems Review of Systems  Constitutional: Negative for chills and fever.  HENT: Negative for sore throat.   Respiratory: Negative for cough and wheezing.   Cardiovascular: Negative for chest pain.  Gastrointestinal: Negative for abdominal pain.  Genitourinary: Negative for difficulty urinating.  All other systems reviewed and are negative.    Physical Exam Updated Vital Signs BP (!) 154/125   Pulse (!) 110   Temp 98.2 F (36.8 C) (Oral)   Resp (!) 30   SpO2 97%   Physical Exam  Constitutional: She is oriented to person, place, and time. She appears well-developed and well-nourished.  HENT:  Head: Normocephalic and atraumatic.  Eyes: EOM are normal. Right eye exhibits no discharge. Left eye exhibits no discharge.  Cardiovascular:  Tachycardic Irregularly Irregular Rhythm  Pulmonary/Chest: Breath sounds normal. She has no wheezes. She has no rales.  Mild respiratory distress  Abdominal: Soft. Bowel sounds are normal. She exhibits no distension. There is no tenderness.  Musculoskeletal: She exhibits no deformity.  1-2+ non  pitting edema  Neurological: She is alert and oriented to person, place, and time.  Skin: Skin is warm and dry.  Psychiatric: She has a normal mood and affect.     ED Treatments / Results  Labs (all labs ordered are listed, but only abnormal results are displayed) Labs Reviewed  CBC - Abnormal; Notable for the following:       Result Value   RBC 3.72 (*)    Hemoglobin 10.9 (*)    HCT 34.2 (*)    All other components within normal limits  COMPREHENSIVE METABOLIC PANEL - Abnormal; Notable for the following:    Glucose, Bld 114 (*)    Creatinine, Ser 1.18 (*)    GFR calc non Af Amer 38 (*)    GFR calc Af Amer 44 (*)    All other components within normal limits  BRAIN NATRIURETIC PEPTIDE - Abnormal; Notable for the following:  B Natriuretic Peptide 942.7 (*)    All other components within normal limits  I-STAT TROPONIN, ED    EKG  EKG Interpretation  Date/Time:  Friday February 01 2017 16:13:37 EDT Ventricular Rate:  120 PR Interval:    QRS Duration: 119 QT Interval:  375 QTC Calculation: 530 R Axis:   15 Text Interpretation:  Atrial fibrillation Right bundle branch block No significant change since last tracing Confirmed by Blanchie Dessert [phone]) on 02/01/2017 4:23:50 PM       Radiology Dg Chest 2 View  Result Date: 02/01/2017 CLINICAL DATA:  Initial evaluation for acute shortness of breath. EXAM: CHEST  2 VIEW COMPARISON:  Prior radiograph from 07/17/2016. FINDINGS: Moderate cardiomegaly, stable. Mediastinal silhouette normal. Aortic atherosclerosis. Lungs hypoinflated. Pulmonary vascular congestion with diffuse interstitial prominence, compatible pulmonary interstitial edema. Small bilateral pleural effusions. Associated bibasilar atelectasis. No other focal infiltrates. No pneumothorax. No acute osseus abnormality.  Diffuse osteopenia noted. IMPRESSION: 1. Cardiomegaly with mild diffuse pulmonary interstitial edema and small bilateral pleural effusions, suggesting  CHF. 2. Shallow lung inflation with superimposed mild bibasilar atelectasis. Electronically Signed   By: Jeannine Boga M.D.   On: 02/01/2017 18:06    Procedures Procedures (including critical care time)  Medications Ordered in ED Medications  furosemide (LASIX) injection 40 mg (not administered)  metoprolol tartrate (LOPRESSOR) injection 5 mg (5 mg Intravenous Given 02/01/17 1804)     Initial Impression / Assessment and Plan / ED Course  I have reviewed the triage vital signs and the nursing notes.  Pertinent labs & imaging results that were available during my care of the patient were reviewed by me and considered in my medical decision making (see chart for details).    Patient in Atrial Fibrillation with RVR with associated SOB and orthopnea. Will evaluate for triggers of a. Fib and evaluate for heart failure. No crackles noted on exam, but JVD present. CHADSVASC: 4, HASBLED: 3. Not anticoagulated in the past due to age and bleeding history (GI bleed). - CBC: Hgb 10.9, WBC 4.3 - CMP: Cr 1.18 (Baseline 1.2) - BNP: BNP 942.7 - CXR: Diffuse interstitial edema and small bilateral pulmonary effusions  83 y/o patient with heart failure exacerbation, atrial fibrillation with shortness of breath at rest. Patients rate improved from 110s-130s to 802- 100s with 5mg  IV metoprolol - Consult to internal medicine for admission  Final Clinical Impressions(s) / ED Diagnoses   Final diagnoses:  Atrial fibrillation with RVR ([place])  Acute on chronic congestive heart failure, unspecified heart failure type [facility])    New Prescriptions New Prescriptions   No medications on file     Neva Seat, MD 02/01/17 Jeananne Rama    Blanchie Dessert, MD 02/04/17 [phone]

## 2017-02-01 NOTE — Progress Notes (Signed)
Troponin 0.03, MD paged.

## 2017-02-02 ENCOUNTER — Inpatient Hospital Stay (HOSPITAL_COMMUNITY): Payer: Medicare Other

## 2017-02-02 DIAGNOSIS — I4891 Unspecified atrial fibrillation: Secondary | ICD-10-CM

## 2017-02-02 DIAGNOSIS — E039 Hypothyroidism, unspecified: Secondary | ICD-10-CM

## 2017-02-02 DIAGNOSIS — I5023 Acute on chronic systolic (congestive) heart failure: Secondary | ICD-10-CM

## 2017-02-02 DIAGNOSIS — E785 Hyperlipidemia, unspecified: Secondary | ICD-10-CM

## 2017-02-02 DIAGNOSIS — I509 Heart failure, unspecified: Secondary | ICD-10-CM

## 2017-02-02 LAB — TROPONIN I
TROPONIN I: 0.04 ng/mL — AB (ref <0.03)
Troponin I: 0.03 ng/mL (ref <0.03)

## 2017-02-02 LAB — BASIC METABOLIC PANEL
ANION GAP: 9 (ref 5–15)
BUN: 16 mg/dL (ref 6–20)
CALCIUM: 8.7 mg/dL — AB (ref 8.9–10.3)
CO2: 23 mmol/L (ref 22–32)
Chloride: 106 mmol/L (ref 101–111)
Creatinine, Ser: 1.16 mg/dL — ABNORMAL HIGH (ref 0.44–1.00)
GFR calc Af Amer: 45 mL/min — ABNORMAL LOW (ref >60)
GFR calc non Af Amer: 39 mL/min — ABNORMAL LOW (ref >60)
GLUCOSE: 99 mg/dL (ref 65–99)
Potassium: 3.7 mmol/L (ref 3.5–5.1)
Sodium: 138 mmol/L (ref 135–145)

## 2017-02-02 LAB — ECHOCARDIOGRAM COMPLETE
Height: 61 in
Weight: 2035.2 oz

## 2017-02-02 MED ORDER — ENOXAPARIN SODIUM 30 MG/0.3ML ~~LOC~~ SOLN
30.0000 mg | SUBCUTANEOUS | Status: DC
  Administered 2017-02-02: 30 mg via SUBCUTANEOUS
  Filled 2017-02-02: qty 0.3

## 2017-02-02 NOTE — Progress Notes (Signed)
PROGRESS NOTE   Julie Lara  NOB:[id number]    DOB: 1920-05-29    DOA: 02/01/2017  PCP: Burnard Bunting, MD   I have briefly reviewed patients previous medical records in [facility].  Brief Narrative:  83 year old female patient, lives alone, ambulates with the help of a cane, family live close by, PMH of CAD, cardiomyopathy, possible chronic systolic CHF but does not seem to be on diuretics at home, PAF (not on anticoagulation secondary to GI bleed), moderate AI, hypothyroid, stage III chronic kidney disease, HLD presented to ED with several days' history of progressive dyspnea, bilateral leg swelling but no chest pain. She reports losing weight recently however. Admitted for acute on chronic systolic CHF. Improving.   Assessment & Plan:   Principal Problem:   Acute on chronic systolic CHF (congestive heart failure) (HCC) Active Problems:   Hypothyroidism   Atrial fibrillation (HCC)   CKD (chronic kidney disease), stage III (HCC)   Anemia   CAD (coronary artery disease)   HLD (hyperlipidemia)   Acute on chronic systolic CHF/cardiomyopathy 2-D echo: LVEF 35-40 percent with diffuse hypokinesis (40% in 2014), moderate AI & MR and severe biatrial enlargement. BNP 942.7 on admission Treated with Lasix 40 mg IV daily. -510 ML overnight. Slowly improving. Continue additional day of IV Lasix then consider transitioning to oral Lasix at discharge. Consider adding low-dose lisinopril for her cardiomyopathy. Not sure if A. fib with RVR precipitated her acute decompensation. She has had prior hospitalization for A. fib with RVR  Paroxysmal A. fib CHA2DS2-VASc Score: 6 Was in rapid A. fib in the 110s-120s earlier but now controlled ventricular rhythm. Not anticoagulation candidate due to history of GI bleed. Continue metoprolol 12.5 MG twice a day. Continue aspirin 81 MG daily.  Elevated troponin Minimal and flat trend. No chest pain. EKG on admission: A. fib with RVR 120 bpm,  RBBB and no acute changes. Likely due to demand ischemia from decompensated CHF and rapid A. fib. Outpatient follow-up with primary cardiologist.  Hypothyroid TSH 2.779 on 07/18/16. Continue home dose of Synthroid.  Stage III chronic kidney disease Baseline creatinine probably in the 1.2-1.3 range. Now creatinine in the 1.1 range. Follow BMP while on diuresis.  Normocytic anemia Continue B12 and iron supplements.  CAD status post stent Asymptomatic of chest pain. Continue aspirin, statins and beta blockers.  Hyperlipidemia Continue statins.  Moderate AI and MR    DVT prophylaxis: Lovenox Code Status: Full Family Communication: Discussed in detail with daughter at bedside Disposition: DC home possibly 11/4   Consultants:  None   Procedures:  2-D echo  Antimicrobials:  None    Subjective: Feels much better. Dyspnea significantly improved and breathing may be almost at baseline. No chest pain or cough reported. Likes to eat salty food. Denies weight gain and instead report some weight loss recently of unclear reason.  ROS: Hard of hearing.  Objective:  Vitals:   02/02/17 0700 02/02/17 0859 02/02/17 0949 02/02/17 1249  BP:  (!) 111/58  (!) 136/93  Pulse:  (!) 107  87  Resp:    18  Temp:    98.2 F (36.8 C)  TempSrc:    Oral  SpO2:  94% 96% 100%  Weight: 57.7 kg (127 lb 3.2 oz)     Height:        Examination:  General exam: Elderly female, moderately built and frail, lying comfortably propped up in bed. Respiratory system: Few basal crackles, left > right. Rest of the lung fields  clear to auscultation. Respiratory effort normal. Cardiovascular system: S1 & S2 heard, RRR. No JVD, murmurs, rubs, gallops or clicks. Trace bilateral ankle edema. Telemetry: A. fib with ventricular rate in the 90s. Gastrointestinal system: Abdomen is nondistended, soft and nontender. No organomegaly or masses felt. Normal bowel sounds heard. Central nervous system: Alert and  oriented. No focal neurological deficits. Hard of hearing. Extremities: Symmetric 5 x 5 power. Skin: No rashes, lesions or ulcers Psychiatry: Judgement and insight appear normal. Mood & affect appropriate.     Data Reviewed: I have personally reviewed following labs and imaging studies  CBC:  Recent Labs Lab 02/01/17 1701  WBC 4.3  HGB 10.9*  HCT 34.2*  MCV 91.9  PLT 615   Basic Metabolic Panel:  Recent Labs Lab 02/01/17 1701 02/02/17 0249  NA 140 138  K 4.9 3.7  CL 109 106  CO2 23 23  GLUCOSE 114* 99  BUN 19 16  CREATININE 1.18* 1.16*  CALCIUM 9.3 8.7*   Liver Function Tests:  Recent Labs Lab 02/01/17 1701  AST 28  ALT 31  ALKPHOS 78  BILITOT 1.0  PROT 6.9  ALBUMIN 3.8   Cardiac Enzymes:  Recent Labs Lab 02/01/17 2118 02/02/17 0249 02/02/17 1038  TROPONINI 0.03* 0.03* 0.04*         Radiology Studies: Dg Chest 2 View  Result Date: 02/01/2017 CLINICAL DATA:  Initial evaluation for acute shortness of breath. EXAM: CHEST  2 VIEW COMPARISON:  Prior radiograph from 07/17/2016. FINDINGS: Moderate cardiomegaly, stable. Mediastinal silhouette normal. Aortic atherosclerosis. Lungs hypoinflated. Pulmonary vascular congestion with diffuse interstitial prominence, compatible pulmonary interstitial edema. Small bilateral pleural effusions. Associated bibasilar atelectasis. No other focal infiltrates. No pneumothorax. No acute osseus abnormality.  Diffuse osteopenia noted. IMPRESSION: 1. Cardiomegaly with mild diffuse pulmonary interstitial edema and small bilateral pleural effusions, suggesting CHF. 2. Shallow lung inflation with superimposed mild bibasilar atelectasis. Electronically Signed   By: Jeannine Boga M.D.   On: 02/01/2017 18:06        Scheduled Meds: . aspirin EC  81 mg Oral Daily  . atorvastatin  40 mg Oral q1800  . cholecalciferol  1,000 Units Oral Daily  . enoxaparin (LOVENOX) injection  30 mg Subcutaneous Q24H  . feeding supplement  (NEPRO CARB STEADY)  237 mL Oral BID BM  . furosemide  40 mg Intravenous Daily  . iron polysaccharides  150 mg Oral BID  . levothyroxine  25 mcg Oral QAC breakfast  . metoprolol tartrate  12.5 mg Oral BID  . sodium chloride flush  3 mL Intravenous Q12H  . vitamin B-12  100 mcg Oral Daily   Continuous Infusions: . sodium chloride       LOS: 1 day     Devera Englander, MD, FACP, FHM. [facility] Pager [phone]  If 7PM-7AM, please contact night-coverage [email or web address] Password [email or web address] 02/02/2017, 2:43 PM

## 2017-02-02 NOTE — Progress Notes (Signed)
VSS.  No complaints of chest pain, does get short of breath with walking to bathroom.  RN provided education to patient and her family regarding heart failure, importance of daily weights and fluid restriction.  Maintains oxygen saturation on room air.  Multiple family members at bedside during shift.

## 2017-02-02 NOTE — Care Management Note (Signed)
Case Management Note  Patient Details  Name: ARIANNE KLINGE MRN: [id number] Date of Birth: [birth year]  Subjective/Objective:                 Patient coming from home. Independent for most of ADLs. Admitted with CHF exacerbation. CM will place [facility] CM consult to potentially follow for CHF as outpatient. PCP:  Burnard Bunting, MD   Cardiologist: Dr. Martinique   Action/Plan:  CM will continue to follow for DC planning. Expected Discharge Date:                  Expected Discharge Plan:  [place]  In-House Referral:     Discharge planning Services  CM Consult  Post Acute Care Choice:    Choice offered to:     DME Arranged:    DME Agency:     HH Arranged:    [place] Agency:     Status of Service:  In process, will continue to follow  If discussed at Long Length of Stay Meetings, dates discussed:    Additional Comments:  Carles Collet, RN 02/02/2017, 11:16 AM

## 2017-02-02 NOTE — Plan of Care (Signed)
Problem: Tissue Perfusion: Goal: Risk factors for ineffective tissue perfusion will decrease Outcome: Progressing Maintains oxygen saturation on room air   Problem: Activity: Goal: Risk for activity intolerance will decrease Outcome: Progressing Dyspnea on exertion improving per patient   Problem: Fluid Volume: Goal: Ability to maintain a balanced intake and output will improve Outcome: Progressing Continues to diurese   Problem: Education: Goal: Knowledge of disease or condition will improve Outcome: Progressing Education provided to family and patient regarding heart failure

## 2017-02-02 NOTE — Progress Notes (Signed)
Nutrition Brief Note  Patient identified on the Malnutrition Screening Tool (MST) Report  Wt Readings from Last 15 Encounters:  02/02/17 127 lb 3.2 oz (57.7 kg)  08/06/16 136 lb 9.6 oz (62 kg)  07/19/16 131 lb 6.4 oz (59.6 kg)  07/11/15 137 lb 6.4 oz (62.3 kg)  11/22/14 139 lb (63 kg)  05/24/14 143 lb 14.4 oz (65.3 kg)  12/01/13 142 lb (64.4 kg)  06/04/13 145 lb (65.8 kg)  04/24/13 143 lb 6.4 oz (65 kg)  03/12/13 138 lb (62.6 kg)  02/25/13 137 lb 3.2 oz (62.2 kg)  12/15/12 140 lb 12.8 oz (63.9 kg)  11/13/12 132 lb 15 oz (60.3 kg)  11/04/12 136 lb 12.8 oz (62.1 kg)  10/15/12 139 lb 15.9 oz (63.5 kg)   Body mass index is 24.03 kg/m. Patient meets criteria for a healthy ht/wt based on current BMI.   Spoke with patient and daughter. Daughter reports the patient typically eats well at home. However, daughter states the patient isnt careful about eating high sodium foods as she should be. She says there are gonna work on this. Daughter also states the patient eats a lot of empty calories in the forms of junk food snacks. Patient admits she just snacks off and on or grazes throughout the day.   Her weight appears to be slightly down, but also had swelling on admit. She gives a UBW of 130 lbs.   Daughter asked if the patient should start consuming Ensure. RD advocated for supplementation with 1-2/day because the patient seems to not have a great appetite and favor sweets/empty calorie snacks. Supplements can provide the patient with much needed protein/calories in a small amount.   No nutrition interventions warranted at this time. If nutrition issues arise, please consult RD.   Burtis Junes RD, LDN, CNSC Clinical Nutrition Pager: [phone] 02/02/2017 3:18 PM

## 2017-02-02 NOTE — Progress Notes (Signed)
  Echocardiogram 2D Echocardiogram has been performed.  Julie Lara 02/02/2017, 12:33 PM

## 2017-02-02 NOTE — Evaluation (Signed)
Physical Therapy Evaluation Patient Details Name: Julie Lara MRN: [id number] DOB: [birth year] Today's Date: 02/02/2017   History of Present Illness  83 year old female patient, lives alone, ambulates with the help of a cane, family live close by, PMH of CAD, cardiomyopathy, possible chronic systolic CHF but does not seem to be on diuretics at home, PAF (not on anticoagulation secondary to GI bleed), moderate AI, hypothyroid, stage III chronic kidney disease, HLD. Admitted for acute on chronic systolic CHF.  Clinical Impression  Pt admitted with above complications. Pt currently with functional limitations due to the deficits listed below (see PT Problem List). Demonstrates generalized weakness and impaired balance, but improves with light UE support. Patient will benefit from outpatient PT for balance and gait training. Family present and supportive, and they will be able to help her as needed at home. Adequate for d/c with family's supervision at home from a PT standpoint. We will continue to follow while here. Pt will benefit from skilled PT to increase their independence and safety with mobility to allow discharge to the venue listed below.       Follow Up Recommendations Outpatient PT;Supervision - Intermittent    Equipment Recommendations  None recommended by PT    Recommendations for Other Services       Precautions / Restrictions Precautions Precautions: Fall Restrictions Weight Bearing Restrictions: No      Mobility  Bed Mobility Overal bed mobility: Independent                Transfers Overall transfer level: Needs assistance Equipment used: None Transfers: Sit to/from Stand Sit to Stand: Supervision         General transfer comment: Supervision for safety without device, stable with UE support. Cues for safety  Ambulation/Gait Ambulation/Gait assistance: Min guard Ambulation Distance (Feet): 100 Feet Assistive device: None Gait Pattern/deviations:  Step-through pattern;Decreased stride length;Shuffle;Wide base of support Gait velocity: decreased Gait velocity interpretation: <1.8 ft/sec, indicative of risk for recurrent falls General Gait Details: Occasional stagger in either direection but able to self correct. slow with wide BOS. cues for safety and awareness. Recommended use of cane for home. Occasionally furniture walking in room.  Stairs            Wheelchair Mobility    Modified Rankin (Stroke Patients Only)       Balance Overall balance assessment: Needs assistance Sitting-balance support: No upper extremity supported;Feet supported Sitting balance-Leahy Scale: Normal     Standing balance support: No upper extremity supported Standing balance-Leahy Scale: Fair                               Pertinent Vitals/Pain Pain Assessment: No/denies pain    Home Living Family/patient expects to be discharged to:: Private residence Living Arrangements: Alone Available Help at Discharge: Family;Friend(s);Available PRN/intermittently Type of Home: House Home Access: Stairs to enter Entrance Stairs-Rails: None Entrance Stairs-Number of Steps: 2 Home Layout: One level Home Equipment: Walker - 2 wheels;Cane - single point      Prior Function Level of Independence: Independent with assistive device(s)         Comments: Uses a cane intermittently for ambulation, occasionally goes out doors.     Hand Dominance        Extremity/Trunk Assessment   Upper Extremity Assessment Upper Extremity Assessment: Defer to OT evaluation    Lower Extremity Assessment Lower Extremity Assessment: Generalized weakness       Communication  Communication: No difficulties  Cognition Arousal/Alertness: Awake/alert Behavior During Therapy: WFL for tasks assessed/performed Overall Cognitive Status: Within Functional Limits for tasks assessed                                        General  Comments General comments (skin integrity, edema, etc.): Family present in room and supportive. Check on patient frequently. She denies any falls in the past 6 months.    Exercises     Assessment/Plan    PT Assessment Patient needs continued PT services  PT Problem List Decreased strength;Decreased activity tolerance;Decreased balance;Decreased mobility;Decreased knowledge of use of DME;Cardiopulmonary status limiting activity       PT Treatment Interventions DME instruction;Gait training;Stair training;Functional mobility training;Therapeutic activities;Therapeutic exercise;Balance training;Patient/family education    PT Goals (Current goals can be found in the Care Plan section)  Acute Rehab PT Goals Patient Stated Goal: go home PT Goal Formulation: With patient/family Time For Goal Achievement: 02/16/17 Potential to Achieve Goals: Good    Frequency Min 5X/week   Barriers to discharge        Co-evaluation               AM-PAC PT "6 Clicks" Daily Activity  Outcome Measure Difficulty turning over in bed (including adjusting bedclothes, sheets and blankets)?: None Difficulty moving from lying on back to sitting on the side of the bed? : None Difficulty sitting down on and standing up from a chair with arms (e.g., wheelchair, bedside commode, etc,.)?: A Little Help needed moving to and from a bed to chair (including a wheelchair)?: A Little Help needed walking in hospital room?: A Little Help needed climbing 3-5 steps with a railing? : A Little 6 Click Score: 20    End of Session Equipment Utilized During Treatment: Gait belt Activity Tolerance: Patient tolerated treatment well Patient left: in bed;with call bell/phone within reach;with bed alarm set;with family/visitor present   PT Visit Diagnosis: Unsteadiness on feet (R26.81);Muscle weakness (generalized) (M62.81)    Time: 5053-9767 PT Time Calculation (min) (ACUTE ONLY): 22 min   Charges:   PT  Evaluation $PT Eval Low Complexity: 1 Low     PT G CodesElayne Snare, [place]   Ellouise Newer 02/02/2017, 4:34 PM

## 2017-02-03 ENCOUNTER — Other Ambulatory Visit: Payer: Self-pay

## 2017-02-03 DIAGNOSIS — N189 Chronic kidney disease, unspecified: Secondary | ICD-10-CM

## 2017-02-03 DIAGNOSIS — N179 Acute kidney failure, unspecified: Secondary | ICD-10-CM

## 2017-02-03 DIAGNOSIS — E876 Hypokalemia: Secondary | ICD-10-CM

## 2017-02-03 LAB — BASIC METABOLIC PANEL
Anion gap: 11 (ref 5–15)
BUN: 24 mg/dL — ABNORMAL HIGH (ref 6–20)
CALCIUM: 8.6 mg/dL — AB (ref 8.9–10.3)
CO2: 24 mmol/L (ref 22–32)
CREATININE: 1.35 mg/dL — AB (ref 0.44–1.00)
Chloride: 102 mmol/L (ref 101–111)
GFR calc Af Amer: 38 mL/min — ABNORMAL LOW (ref >60)
GFR calc non Af Amer: 32 mL/min — ABNORMAL LOW (ref >60)
GLUCOSE: 86 mg/dL (ref 65–99)
Potassium: 3.3 mmol/L — ABNORMAL LOW (ref 3.5–5.1)
Sodium: 137 mmol/L (ref 135–145)

## 2017-02-03 LAB — CBC
HCT: 32.5 % — ABNORMAL LOW (ref 36.0–46.0)
HEMOGLOBIN: 10.5 g/dL — AB (ref 12.0–15.0)
MCH: 29.4 pg (ref 26.0–34.0)
MCHC: 32.3 g/dL (ref 30.0–36.0)
MCV: 91 fL (ref 78.0–100.0)
PLATELETS: 156 10*3/uL (ref 150–400)
RBC: 3.57 MIL/uL — ABNORMAL LOW (ref 3.87–5.11)
RDW: 15 % (ref 11.5–15.5)
WBC: 3.9 10*3/uL — ABNORMAL LOW (ref 4.0–10.5)

## 2017-02-03 MED ORDER — FUROSEMIDE 20 MG PO TABS: 20.0000 mg | ORAL_TABLET | ORAL | 0 refills | Status: DC

## 2017-02-03 MED ORDER — POTASSIUM CHLORIDE CRYS ER 20 MEQ PO TBCR
40.0000 meq | EXTENDED_RELEASE_TABLET | Freq: Once | ORAL | Status: AC
  Administered 2017-02-03: 40 meq via ORAL
  Filled 2017-02-03: qty 2

## 2017-02-03 MED ORDER — NEPRO/CARBSTEADY PO LIQD: 237.0000 mL | Freq: Two times a day (BID) | ORAL | Status: DC

## 2017-02-03 NOTE — Plan of Care (Signed)
Patient received education on heart failure utilizing living with heart failure booklet.

## 2017-02-03 NOTE — Discharge Summary (Signed)
Physician Discharge Summary  LAJOYCE TAMURA OVF:[id number] DOB: 25-Oct-1920  PCP: Burnard Bunting, MD  Admit date: 02/01/2017 Discharge date: 02/03/2017  Recommendations for Outpatient Follow-up:  1. Dr. Burnard Bunting, PCP in 3 days with repeat labs (CBC & BMP). 2. Dr. Peter Martinique, Cardiology: Patient now has appointment on 02/05/17 at 4 PM.  Home Health: Outpatient PT. Equipment/Devices: None.    Discharge Condition: Improved and stable.  CODE STATUS: Full.  Diet recommendation: Heart healthy diet.  Discharge Diagnoses:  Principal Problem:   Acute on chronic systolic CHF (congestive heart failure) (HCC) Active Problems:   Hypothyroidism   Atrial fibrillation (HCC)   CKD (chronic kidney disease), stage III (HCC)   Anemia   CAD (coronary artery disease)   HLD (hyperlipidemia)   Brief Summary: 83 year old female patient, lives alone, ambulates with the help of a cane, family live close by, PMH of CAD, cardiomyopathy, possible chronic systolic CHF but does not seem to be on diuretics at home, PAF (not on anticoagulation secondary to GI bleed), moderate AI, hypothyroid, stage III chronic kidney disease, HLD presented to ED with several days' history of progressive dyspnea, bilateral leg swelling but no chest pain. She reports losing weight recently however. Admitted for acute on chronic systolic CHF.    Assessment & Plan:   Acute on chronic systolic CHF/cardiomyopathy 2-D echo: LVEF 35-40 percent with diffuse hypokinesis (40% in 2014), moderate AI & MR and severe biatrial enlargement. BNP 942.7 on admission Treated with Lasix 40 mg IV daily. -350 mL - Not sure if A. fib with RVR precipitated her acute decompensation. She has had prior hospitalization for A. fib with RVR - On day of discharge, clinically euvolemic and creatinine increased from 1.16-1.35. Discontinued IV Lasix. Start low dose oral Lasix 20 mg every other day beginning 11/5 with close outpatient follow-up of BMP.  Patient will need ACEI/ARB (when renal function stabilizes) versus BiDil. Defer to outpatient cardiology follow-up.  Paroxysmal A. fib CHA2DS2-VASc Score: 6 Was in rapid A. fib in the 110s-120s earlier but now controlled ventricular rhythm. Not anticoagulation candidate due to history of GI bleed. Continue metoprolol 12.5 MG twice a day (had been decreased as outpatient due to bradycardia). Continue aspirin 81 MG daily.  Elevated troponin Minimal and flat trend. No chest pain. EKG on admission: A. fib with RVR 120 bpm, RBBB and no acute changes. Likely due to demand ischemia from decompensated CHF and rapid A. fib. Outpatient follow-up with primary cardiologist.  Hypothyroid TSH 2.779 on 07/18/16. Continue home dose of Synthroid.  Acute on Stage III chronic kidney disease Baseline creatinine probably in the 1.2-1.3 range. Presented with creatinine of 1.18 which increase 2 days later to 1.35 secondary to diuresis. Discontinued Lasix. Hold diuretics for a day and resume low-dose oral Lasix as outpatient with close monitoring of BMP.  Normocytic anemia Continue B12 and iron supplements. Stable hemoglobin.  CAD status post stent Asymptomatic of chest pain. Continue aspirin, statins and beta blockers.  Hyperlipidemia Continue statins.  Moderate AI and MR  Hypokalemia Replaced prior to discharge.    Consultants:  None   Procedures:  2-D echo     Discharge Instructions  Discharge Instructions    (HEART FAILURE PATIENTS) Call MD:  Anytime you have any of the following symptoms: 1) 3 pound weight gain in 24 hours or 5 pounds in 1 week 2) shortness of breath, with or without a dry hacking cough 3) swelling in the hands, feet or stomach 4) if you have to sleep on  extra pillows at night in order to breathe.   Complete by:  As directed    Call MD for:  difficulty breathing, headache or visual disturbances   Complete by:  As directed    Call MD for:  extreme fatigue    Complete by:  As directed    Call MD for:  persistant dizziness or light-headedness   Complete by:  As directed    Diet - low sodium heart healthy   Complete by:  As directed    Increase activity slowly   Complete by:  As directed        Medication List    TAKE these medications   aspirin EC 81 MG tablet Take 81 mg by mouth daily.   atorvastatin 40 MG tablet Commonly known as:  LIPITOR Take 1 tablet (40 mg total) by mouth daily at 6 PM.   feeding supplement (NEPRO CARB STEADY) Liqd Take 237 mLs 2 (two) times daily between meals by mouth.   FERREX 150 150 MG capsule Generic drug:  iron polysaccharides Take 150 mg by mouth 2 (two) times daily.   furosemide 20 MG tablet Commonly known as:  LASIX Take 1 tablet (20 mg total) every other day by mouth. Start 02/04/2017. Start taking on:  02/04/2017   levothyroxine 25 MCG tablet Commonly known as:  SYNTHROID, LEVOTHROID Take 25 mcg by mouth daily before breakfast.   metoprolol tartrate 25 MG tablet Commonly known as:  LOPRESSOR Take 0.5 tablets (12.5 mg total) by mouth 2 (two) times daily.   nitroGLYCERIN 0.4 MG SL tablet Commonly known as:  NITROSTAT Place 1 tablet (0.4 mg total) under the tongue every 5 (five) minutes as needed for chest pain.   VITAMIN B-12 PO Take 1 tablet by mouth daily.   VITAMIN D PO Take 1,000 Units by mouth daily.      Follow-up Information    Burnard Bunting, MD. Schedule an appointment as soon as possible for a visit in 3 day(s).   Specialty:  Internal Medicine Why:  To be seen with repeat labs (CBC & BMP). Contact information: [place] [address] [phone]        Martinique, Peter M, MD. Schedule an appointment as soon as possible for a visit in 1 week(s).   Specialty:  Cardiology Contact information: [address] [place] [address] [phone]          No Known Allergies    Procedures/Studies: Dg Chest 2 View  Result Date:  02/01/2017 CLINICAL DATA:  Initial evaluation for acute shortness of breath. EXAM: CHEST  2 VIEW COMPARISON:  Prior radiograph from 07/17/2016. FINDINGS: Moderate cardiomegaly, stable. Mediastinal silhouette normal. Aortic atherosclerosis. Lungs hypoinflated. Pulmonary vascular congestion with diffuse interstitial prominence, compatible pulmonary interstitial edema. Small bilateral pleural effusions. Associated bibasilar atelectasis. No other focal infiltrates. No pneumothorax. No acute osseus abnormality.  Diffuse osteopenia noted. IMPRESSION: 1. Cardiomegaly with mild diffuse pulmonary interstitial edema and small bilateral pleural effusions, suggesting CHF. 2. Shallow lung inflation with superimposed mild bibasilar atelectasis. Electronically Signed   By: Jeannine Boga M.D.   On: 02/01/2017 18:06      Subjective: Patient denies complaints. No dyspnea, chest pain, palpitations, dizziness or lightheadedness. No leg swelling. Eager to go home.  Discharge Exam:  Vitals:   02/03/17 0019 02/03/17 0513 02/03/17 0927 02/03/17 1132  BP: (!) 142/79 119/78 132/85 (!) 133/92  Pulse: 93 (!) 108 (!) 104 89  Resp: 18 18  18   Temp: 98.1 F (  36.7 C) 98.5 F (36.9 C)  98 F (36.7 C)  TempSrc: Oral Tympanic  Oral  SpO2: 95% 95% 97% 98%  Weight:  56.9 kg (125 lb 8 oz)    Height:        General exam: Elderly female, moderately built and frail, sitting up comfortably in bed. Son and daughter at bedside. Respiratory system: Clear to auscultation. Respiratory effort normal. Cardiovascular system: S1 & S2 heard, Irregularly irregular. No JVD, murmurs, rubs, gallops or clicks. No pedal edema. Telemetry: A. fib with ventricular rate in the 90s. Gastrointestinal system: Abdomen is nondistended, soft and nontender. No organomegaly or masses felt. Normal bowel sounds heard. Central nervous system: Alert and oriented. No focal neurological deficits. Hard of hearing. Extremities: Symmetric 5 x 5  power. Skin: No rashes, lesions or ulcers Psychiatry: Judgement and insight appear normal. Mood & affect appropriate.       The results of significant diagnostics from this hospitalization (including imaging, microbiology, ancillary and laboratory) are listed below for reference.     Labs: CBC: Recent Labs  Lab 02/01/17 1701 02/03/17 0325  WBC 4.3 3.9*  HGB 10.9* 10.5*  HCT 34.2* 32.5*  MCV 91.9 91.0  PLT 153 423   Basic Metabolic Panel: Recent Labs  Lab 02/01/17 1701 02/02/17 0249 02/03/17 0325  NA 140 138 137  K 4.9 3.7 3.3*  CL 109 106 102  CO2 23 23 24   GLUCOSE 114* 99 86  BUN 19 16 24*  CREATININE 1.18* 1.16* 1.35*  CALCIUM 9.3 8.7* 8.6*   Liver Function Tests: Recent Labs  Lab 02/01/17 1701  AST 28  ALT 31  ALKPHOS 78  BILITOT 1.0  PROT 6.9  ALBUMIN 3.8   BNP (last 3 results) Recent Labs    02/01/17 1701  BNP 942.7*   Cardiac Enzymes: Recent Labs  Lab 02/01/17 2118 02/02/17 0249 02/02/17 1038  TROPONINI 0.03* 0.03* 0.04*   Discussed extensively with patient's daughter and son at bedside. Updated care and answered questions.    Time coordinating discharge: Over 30 minutes  SIGNED:  Vernell Leep, MD, FACP, [place]. [facility] Pager [phone]  If 7PM-7AM, please contact night-coverage [email or web address] Password [email or web address] 02/03/2017, 11:52 AM

## 2017-02-03 NOTE — Progress Notes (Signed)
Discharge instructions reviewed with patient and son, questions answered, verbalized understanding.  Patient transported to main entrance of hospital via wheelchair to be taken home by son.

## 2017-02-03 NOTE — Discharge Instructions (Signed)

## 2017-02-04 ENCOUNTER — Other Ambulatory Visit: Payer: Self-pay | Admitting: *Deleted

## 2017-02-04 NOTE — Patient Outreach (Signed)
[place] [facility]) Care Management  02/04/2017  Julie Lara 1921/03/04 [id number]   Referral received from inpatient care manager for involvement as member was recently discharged from hospital after being recently diagnosed with heart failure.  Per chart, she also has history of cardiomyopathy, atrial fibrillation, hypothyroidism, and hyperlipidemia.  Call placed to member for transition of care, placed daughter Julie Lara on phone after verifying identity.  [facility] care management services explained, she report she is familiar with services through her husband.  She state that she feel this would benefit member as she lives alone and could use the support.    Patient was recently discharged from hospital and all medications have been reviewed.  Report compliance with medications.  Does not weigh herself daily or follow a strict low sodium diet.  Daughter will continue to work on education, agrees to have home visit with this care manager within the next 2 weeks.  Has appointment with cardiology tomorrow, denies urgent concerns.   [facility] CM Care Plan Problem One     Most Recent Value  Care Plan Problem One  Risk for readmission related to heart failure management as evidenced by recent hospitalization  Role Documenting the Problem One  Care Management [place] for Problem One  Active  [facility] Long Term Goal   Member will not be readmitted to hospital within 31 days of discharge  [facility] Long Term Goal Start Date  02/04/17  Interventions for Problem One Long Term Goal  Discussed with member the importance of following discharge instructions, including follow up appointments, medications, diet, to decrease the risk of readmission  [facility] CM Short Term Goal #1   Member will weigh self and record readings daily over the next 4 weeks  [facility] CM Short Term Goal #1 Start Date  02/04/17  Interventions for Short Term Goal #1  Educated daughter the importance of daily weights and how it relates to  manageing fluid status/heart failure.  [facility] CM Short Term Goal #2   Will keep and attend follow up appointments with primary MD & cardiologist within the next 4 weeks  Interventions for Short Term Goal #2  Confirmed with daughter that member has follow up appointments scheduled as well as transportation for both.  Educated on imporatance of keeping appointments in effort to effectively manage heart failure.      Valente David, [place], MSN [place] [phone]

## 2017-02-05 ENCOUNTER — Ambulatory Visit (INDEPENDENT_AMBULATORY_CARE_PROVIDER_SITE_OTHER): Payer: Medicare Other | Admitting: Cardiology

## 2017-02-05 ENCOUNTER — Encounter: Payer: Self-pay | Admitting: Cardiology

## 2017-02-05 VITALS — BP 112/68 | HR 90 | Ht 61.0 in | Wt 131.4 lb

## 2017-02-05 DIAGNOSIS — I2510 Atherosclerotic heart disease of native coronary artery without angina pectoris: Secondary | ICD-10-CM | POA: Diagnosis not present

## 2017-02-05 DIAGNOSIS — I480 Paroxysmal atrial fibrillation: Secondary | ICD-10-CM

## 2017-02-05 DIAGNOSIS — I5022 Chronic systolic (congestive) heart failure: Secondary | ICD-10-CM

## 2017-02-05 MED ORDER — FUROSEMIDE 20 MG PO TABS: 20.0000 mg | ORAL_TABLET | ORAL | 3 refills | Status: DC

## 2017-02-05 MED ORDER — METOPROLOL TARTRATE 25 MG PO TABS: 25.0000 mg | ORAL_TABLET | Freq: Two times a day (BID) | ORAL | 3 refills | Status: DC

## 2017-02-05 NOTE — Patient Instructions (Signed)
Increase your metoprolol to 25 mg twice a day  Restrict your sodium intake   We will schedule follow up in 2 weeks with a chemistry panel.

## 2017-02-05 NOTE — Progress Notes (Signed)
Cardiology Office Note    Date:  02/05/2017   ID:  [name], DOB [birth year], MRN [id number]  PCP:  Burnard Bunting, MD  Cardiologist: Dr. Martinique  Chief Complaint  Patient presents with  . Congestive Heart Failure    History of Present Illness:    Julie Lara is a 83 y.o. female with past medical history of CAD (s/p BMS to RCA in 2014), PAF (not on anticoagulation secondary to GI bleed), moderate AI, hypothyroidism, and Stage 3 CKD who presents for post hospital  follow-up of CHF.   She was admitted to [facility] from 4/17 - 07/19/2016 for evaluation of chest pain.  Upon arrival to the ED, her HR was elevated into the 100's. Lopressor was increased from 12.5mg  BID to 25mg  BID as her presenting symptoms were thought to be secondary to her elevated HR. Cyclic troponin values remained negative and her EKG showed no acute ischemic changes, therefore further ischemic evaluation was not pursued.   She was readmitted 11/2-11/4/18 with CHF exacerbation. She was diuresed with IV lasix. Troponin minimally elevated with flat trend. BNP elevated 940. She was in Afib with initial rate 120 but rate then became controlled. Echo done as noted below with new reduction in EF.   In talking with the patient today, she reports she is doing much better since her recent hospitalization. No chest discomfort, palpitations, lightheadedness, dizziness, or presyncope. Breathing is back to baseline with no exertional dyspnea, orthopnea, or PND. She weighed at home today at 127 lbs which was her DC weight. She is taking lasix 20 mg every other day due to renal insufficiency.     Past Medical History:  Diagnosis Date  . Acute blood loss anemia    November 2014  . Aortic insufficiency    mild to moderate  . Arthritis    "right upper arm" (02/01/2017)  . Atrial fibrillation ([place])   . CAD (coronary artery disease)   . Chest pain 04-2010   atypical with negative echocardiogram  . Dilated cardiomyopathy  ([place])   . Goiter   . Hypothyroidism   . Idiopathic cardiomyopathy ([place]) 1999   EF 40% by Echo on 10/13/12  . NSTEMI (non-ST elevated myocardial infarction) [facility]) July 2014   95% prox RCA s/p BMS  . PAC (premature atrial contraction)    Chronic    Past Surgical History:  Procedure Laterality Date  . biopsy of sacral lesion  2010  . CARDIAC CATHETERIZATION    . CORONARY ANGIOPLASTY WITH STENT PLACEMENT  09/2012   BMS-95% prox RCA  . THYROIDECTOMY  2008  . VAGINAL HYSTERECTOMY     Complete    Current Medications: Outpatient Medications Prior to Visit  Medication Sig Dispense Refill  . aspirin EC 81 MG tablet Take 81 mg by mouth daily.    Marland Kitchen atorvastatin (LIPITOR) 40 MG tablet Take 1 tablet (40 mg total) by mouth daily at 6 PM. 90 tablet 3  . Cholecalciferol (VITAMIN D PO) Take 1,000 Units by mouth daily.     . Cyanocobalamin (VITAMIN B-12 PO) Take 1 tablet by mouth daily.    . iron polysaccharides (FERREX 150) 150 MG capsule Take 150 mg by mouth 2 (two) times daily.     Marland Kitchen levothyroxine (SYNTHROID, LEVOTHROID) 25 MCG tablet Take 25 mcg by mouth daily before breakfast.    . nitroGLYCERIN (NITROSTAT) 0.4 MG SL tablet Place 1 tablet (0.4 mg total) under the tongue every 5 (five) minutes as needed for chest pain.  25 tablet 3  . Nutritional Supplements (FEEDING SUPPLEMENT, NEPRO CARB STEADY,) LIQD Take 237 mLs 2 (two) times daily between meals by mouth.    . furosemide (LASIX) 20 MG tablet Take 1 tablet (20 mg total) every other day by mouth. Start 02/04/2017. 15 tablet 0  . metoprolol tartrate (LOPRESSOR) 25 MG tablet Take 0.5 tablets (12.5 mg total) by mouth 2 (two) times daily. 45 tablet 3   No facility-administered medications prior to visit.      Allergies:   Patient has no known allergies.   Social History   Socioeconomic History  . Marital status: Widowed    Spouse name: None  . Number of children: None  . Years of education: None  . Highest education level: None  Social  Needs  . Financial resource strain: None  . Food insecurity - worry: None  . Food insecurity - inability: None  . Transportation needs - medical: None  . Transportation needs - non-medical: None  Occupational History  . Occupation: Retired    Fish farm manager: [employer]  Tobacco Use  . Smoking status: Never Smoker  . Smokeless tobacco: Never Used  Substance and Sexual Activity  . Alcohol use: No  . Drug use: No  . Sexual activity: Not Currently  Other Topics Concern  . None  Social History Narrative   Pt lives alone, but has family close by.     Family History:  The patient's family history includes Diabetes in her sister.   Review of Systems:   As noted in the history of present illness.     All other systems reviewed and are otherwise negative except as noted above.   Physical Exam:    VS:  BP 112/68   Pulse 90   Ht 5\' 1"  (1.549 m)   Wt 131 lb 6.4 oz (59.6 kg)   BMI 24.83 kg/m    GENERAL:  Well appearing HEENT:  PERRL, EOMI, sclera are clear. Oropharynx is clear. NECK:  No jugular venous distention, carotid upstroke brisk and symmetric, no bruits, no thyromegaly or adenopathy LUNGS:  Clear to auscultation bilaterally CHEST:  Unremarkable HEART:  IRRR,  PMI not displaced or sustained,S1 and S2 within normal limits, no S3, no S4: no clicks, no rubs, no murmurs ABD:  Soft, nontender. BS +, no masses or bruits. No hepatomegaly, no splenomegaly EXT:  2 + pulses throughout, no edema, no cyanosis no clubbing SKIN:  Warm and dry.  No rashes NEURO:  Alert and oriented x 3. Cranial nerves II through XII intact. PSYCH:  Cognitively intact    Wt Readings from Last 3 Encounters:  02/05/17 131 lb 6.4 oz (59.6 kg)  02/03/17 125 lb 8 oz (56.9 kg)  08/06/16 136 lb 9.6 oz (62 kg)     Studies/Labs Reviewed:   EKG:  EKG is not ordered today.    Recent Labs: 07/18/2016: TSH 2.779 02/01/2017: ALT 31; B Natriuretic Peptide 942.7 02/03/2017: BUN 24; Creatinine, Ser 1.35;  Hemoglobin 10.5; Platelets 156; Potassium 3.3; Sodium 137   Lipid Panel    Component Value Date/Time   CHOL 146 12/15/2012 1552   TRIG 54.0 12/15/2012 1552   HDL 64.10 12/15/2012 1552   CHOLHDL 2 12/15/2012 1552   VLDL 10.8 12/15/2012 1552   LDLCALC 71 12/15/2012 1552    Additional studies/ records that were reviewed today include:   Echocardiogram: 09/2012 Study Conclusions  - Left ventricle: The cavity size was moderately dilated. Wall thickness was increased in a pattern of mild LVH.  The estimated ejection fraction was 40%. Diffuse hypokinesis. - Aortic valve: Cannot tell if valve is tri leaflet or not Mild regurgitation. - Mitral valve: Mild regurgitation. - Left atrium: The atrium was mildly dilated. - Right atrium: The atrium was mildly dilated. - Atrial septum: No defect or patent foramen ovale was identified. - Impressions: Cannot r/o thrombus in IVC on limited subcostal window Impressions:  - Cannot r/o thrombus in IVC on limited subcostal window  Echo 02/02/17: Study Conclusions  - Left ventricle: The cavity size was normal. Wall thickness was   increased in a pattern of moderate LVH. Systolic function was   moderately reduced. The estimated ejection fraction was in the   range of 35% to 40%. Diffuse hypokinesis. The study is not   technically sufficient to allow evaluation of LV diastolic   function. - Aortic valve: Trileaflet; mildly calcified leaflets. There was   moderate regurgitation. - Aorta: Ascending aortic diameter: 43 mm (S). - Ascending aorta: The ascending aorta was mildly dilated. - Mitral valve: Mildly thickened leaflets . There was moderate   regurgitation. - Left atrium: Severely dilated. - Right ventricle: The cavity size was mildly dilated. Systolic   function is mildly reduced. - Right atrium: Severely dilated. - Atrial septum: Bows from left to right, suggesting of high LA >   RA filling pressure. - Tricuspid valve: There  was mild regurgitation. - Pulmonary arteries: PA peak pressure: 33 mm Hg (S). - Inferior vena cava: The vessel was dilated. The respirophasic   diameter changes were blunted (< 50%), consistent with elevated   central venous pressure.  Impressions:  - Compared to a prior study in 2014, the LVEF is lower at 35-40%   with diffuse hypokinesis, moderate AI and MR, mild TR, severe   biatrial enlargement. Rhythm appears to be atrial fibrillation.   Assessment:    1. Chronic systolic heart failure ([place])   2. Chronic systolic congestive heart failure (H[facility])   3. Paroxysmal atrial fibrillation ([place])   4. Coronary artery disease involving native coronary artery of native heart without angina pectoris      Plan:   In order of problems listed above:  1. Chronic systolic CHF.  - recent hospitalization with exacerbation. EF reduced to 35-40%. Looks good today. Will continue lasix every other day for now. Stressed importance of sodium restriction- especially processed meats and canned foods. Fluid restriction. Will increase metoprolol to 25 mg bid since I think her HR is a little fast today. She has had some bradycardia before so we will watch closely. Will arrange close follow up in 2 weeks. If BP allows may consider low dose ACEi depending on renal function. Will repeat BMET and BNP in 2 weeks.   2. CAD - s/p BMS to RCA in 2014.  - she denies any recurrent chest pain -  continue ASA and statin therapy. Prn Ntg  2. Paroxysmal Atrial Fibrillation - This patients CHA2DS2-VASc Score and unadjusted Ischemic Stroke Rate (% per year) is equal to 9.7 % stroke rate/year from a score of 6 (CHF, HTN, Vascular, Female, Age (2)). She is not on anticoagulation secondary to a history of GI bleeding and her advanced age. Continue ASA 81 mg daily. - She is bradycardic with a heart rate of 44 (increasing into 50's with ambulation) by examination today. Will reduce Lopressor from 25mg  BID to 12.5mg  BID as she  denies any recurrent palpitations. She can take an additional 12.5mg  if needed for palpitations.   3. Moderate Aortic  Insufficiency/ LV dysfunction -  medical management favored in the setting of her advanced age.     4. Hypothyroidism - remains on Synthroid 25 mcg daily.  - followed by PCP.   5. Stage 3 CKD - monitor closely with diuretics.    Signed, Peter Martinique, MD  02/05/2017 5:09 PM    [facility] [place], [place], [place]  [address] Phone: [phone]; Fax: [phone]  [address], [place], [place] [address] Phone: [phone]

## 2017-02-06 DIAGNOSIS — N183 Chronic kidney disease, stage 3 (moderate): Secondary | ICD-10-CM | POA: Diagnosis not present

## 2017-02-11 ENCOUNTER — Other Ambulatory Visit: Payer: Self-pay | Admitting: *Deleted

## 2017-02-11 DIAGNOSIS — I10 Essential (primary) hypertension: Secondary | ICD-10-CM | POA: Diagnosis present

## 2017-02-11 NOTE — Patient Outreach (Signed)
[facility]) Care Management  02/11/2017  GWENDALYN MCGONAGLE 01-05-1921 [id number]   Weekly transition of care call placed to [facility] caregiver/daughter, [name], no answer.  HIPAA compliant voice message left.  Will await call back, if no call back, will follow up next week.  Valente David, MSN, RN [facility] Care Management  [facility] Manager [phone]

## 2017-02-13 ENCOUNTER — Telehealth: Payer: Self-pay | Admitting: Cardiology

## 2017-02-13 ENCOUNTER — Encounter: Payer: Self-pay | Admitting: *Deleted

## 2017-02-13 DIAGNOSIS — R5381 Other malaise: Secondary | ICD-10-CM

## 2017-02-13 NOTE — Telephone Encounter (Signed)
Returned call to patient's daughter [name].She stated when mother was in hospital she was told after discharge she would need home PT for strengthening.Stated she has not heard anything.Advised I will send message to [facility] for a order.

## 2017-02-13 NOTE — Telephone Encounter (Signed)
OK to order home health RN and PT. Recent hospitalization for CHF and deconditioned.   Aanyah Loa Martinique MD, [facility]

## 2017-02-13 NOTE — Telephone Encounter (Signed)
New message    Patient daughter [name] calling need a referral for physical therapy for her mother.

## 2017-02-13 NOTE — Telephone Encounter (Signed)
Returned call to patient's daughter [name].Dr.Jordan's recommendations given.She stated she wants to take her mother to physical therapy 3 times a week,not home PT.Advised our scheduler will call back with appointment.

## 2017-02-18 ENCOUNTER — Encounter: Payer: Self-pay | Admitting: *Deleted

## 2017-02-18 ENCOUNTER — Other Ambulatory Visit: Payer: Self-pay | Admitting: *Deleted

## 2017-02-18 NOTE — Patient Outreach (Signed)
[facility]) Care Management   02/18/2017  Julie Lara [birth year] [id number]  Julie Lara is an 83 y.o. female  Subjective:   Member alert and oriented x3.  Denies pain or discomfort, denies shortness of breath.  Report compliance with medications.  Patient was recently discharged from hospital and all medications have been reviewed.  She also report compliance with weights and low sodium diet, stating her family usually bring her food that is low in salt.  State when she does cook she does not use salt.   Objective:   Review of Systems  Constitutional: Negative.   HENT: Negative.   Eyes: Negative.   Respiratory: Positive for cough.   Cardiovascular: Negative.   Gastrointestinal: Negative.   Genitourinary: Negative.   Musculoskeletal: Negative.   Skin: Negative.   Neurological: Negative.   Endo/Heme/Allergies: Negative.   Psychiatric/Behavioral: Negative.     Physical Exam  Constitutional: She is oriented to person, place, and time. She appears well-developed and well-nourished.  Neck: Normal range of motion.  Cardiovascular:  Irregular  Respiratory: Effort normal and breath sounds normal.  GI: Soft. Bowel sounds are normal.  Musculoskeletal: Normal range of motion.  Neurological: She is alert and oriented to person, place, and time.  Skin: Skin is warm and dry.   BP 114/72   Pulse 74   Resp 20   Ht 1.6 m (5' 3" )   Wt 128 lb 14.4 oz (58.5 kg)   SpO2 98%   BMI 22.83 kg/m   Encounter Medications:   Outpatient Encounter Medications as of 02/18/2017  Medication Sig  . aspirin EC 81 MG tablet Take 81 mg by mouth daily.  Marland Kitchen atorvastatin (LIPITOR) 40 MG tablet Take 1 tablet (40 mg total) by mouth daily at 6 PM.  . Cholecalciferol (VITAMIN D PO) Take 1,000 Units by mouth daily.   . Cyanocobalamin (VITAMIN B-12 PO) Take 1 tablet by mouth daily.  . furosemide (LASIX) 20 MG tablet Take 1 tablet (20 mg total) every other day by mouth. Start  02/04/2017.  . iron polysaccharides (FERREX 150) 150 MG capsule Take 150 mg by mouth 2 (two) times daily.   Marland Kitchen levothyroxine (SYNTHROID, LEVOTHROID) 25 MCG tablet Take 25 mcg by mouth daily before breakfast.  . metoprolol tartrate (LOPRESSOR) 25 MG tablet Take 1 tablet (25 mg total) 2 (two) times daily by mouth.  . Nutritional Supplements (FEEDING SUPPLEMENT, NEPRO CARB STEADY,) LIQD Take 237 mLs 2 (two) times daily between meals by mouth.  . nitroGLYCERIN (NITROSTAT) 0.4 MG SL tablet Place 1 tablet (0.4 mg total) under the tongue every 5 (five) minutes as needed for chest pain. (Patient not taking: Reported on 02/18/2017)   No facility-administered encounter medications on file as of 02/18/2017.     Functional Status:   In your present state of health, do you have any difficulty performing the following activities: 02/18/2017 02/01/2017  Hearing? N N  Vision? N N  Comment reading glasses -  Difficulty concentrating or making decisions? N N  Walking or climbing stairs? Y Y  Comment - "extra careful going up stairs"  Dressing or bathing? N N  Doing errands, shopping? Y Y  Comment - "I don't drive nowChief Executive Officer and eating ? N -  Using the Toilet? N -  In the past six months, have you accidently leaked urine? Y -  Do you have problems with loss of bowel control? N -  Managing your Medications? Y -  Managing your Finances?  N -  Housekeeping or managing your Housekeeping? N -  Some recent data might be hidden    Fall/Depression Screening:    Fall Risk  02/18/2017  Falls in the past year? No   PHQ 2/9 Scores 02/18/2017 11/19/2012  PHQ - 2 Score 0 0    Assessment:    Met with member at scheduled time.  [facility] care management services explained, state she spoke with daughter [name] who explained services as well as her husband was a member.  Consent signed.  Member has had follow up appointment with cardiologist, has not seen her primary MD.  Has another appointment with cardiology  this week.  Adult children are very active in care, she does not drive anymore and depend on them for transportation.  Denies the need for assistance with transportation, denies the need for help in the home as she lives alone and is independent.  Her daughter assist with medication management, filling pill boxes.  Denies the need for pharmacy involvement.  She is advised to follow up with primary MD as well as cardiology.  Provided member with contact information for this care manager, advised to contact with questions.  Also advised of 24 hour nurse advise line.  Denies any concerns at this time.  Plan:   Will follow up with weekly transition of care call next week.  THN CM Care Plan Problem One     Most Recent Value  Care Plan Problem One  Risk for readmission related to heart failure management as evidenced by recent hospitalization  Role Documenting the Problem One  Care Management [place] for Problem One  Active  [facility] Long Term Goal   Member will not be readmitted to hospital within 31 days of discharge  [facility] Long Term Goal Start Date  02/04/17  Interventions for Problem One Long Term Goal  Educated member on heart failure zones and action plan.  Provided [facility] calendar with copy of zones, verified member has magnet with action plan on refrigerator.  THN CM Short Term Goal #1   Member will weigh self and record readings daily over the next 4 weeks  THN CM Short Term Goal #1 Start Date  02/04/17  Interventions for Short Term Goal #1  Provided member with [facility] calendar tool book including weight log.  Advised of importance of daily weights, educated on when to contact MD with weight gain.  THN CM Short Term Goal #2   Will keep and attend follow up appointments with primary MD & cardiologist within the next 4 weeks  THN CM Short Term Goal #2 Start Date  02/18/17  [facility] CM Short Term Goal #2 Met Date  02/18/17  Interventions for Short Term Goal #2  Confirmed with daughter that member  has follow up appointments scheduled as well as transportation for both.  Educated on imporatance of keeping appointments in effort to effectively manage heart failure.     Valente David, [place], MSN [place] [phone]

## 2017-02-20 ENCOUNTER — Encounter: Payer: Self-pay | Admitting: Physician Assistant

## 2017-02-20 ENCOUNTER — Ambulatory Visit (INDEPENDENT_AMBULATORY_CARE_PROVIDER_SITE_OTHER): Payer: Medicare Other | Admitting: Physician Assistant

## 2017-02-20 VITALS — BP 134/76 | HR 86 | Ht 61.0 in | Wt 132.0 lb

## 2017-02-20 DIAGNOSIS — E039 Hypothyroidism, unspecified: Secondary | ICD-10-CM | POA: Diagnosis not present

## 2017-02-20 DIAGNOSIS — I351 Nonrheumatic aortic (valve) insufficiency: Secondary | ICD-10-CM | POA: Diagnosis not present

## 2017-02-20 DIAGNOSIS — I481 Persistent atrial fibrillation: Secondary | ICD-10-CM

## 2017-02-20 DIAGNOSIS — I5022 Chronic systolic (congestive) heart failure: Secondary | ICD-10-CM | POA: Diagnosis not present

## 2017-02-20 DIAGNOSIS — N183 Chronic kidney disease, stage 3 unspecified: Secondary | ICD-10-CM

## 2017-02-20 DIAGNOSIS — I4819 Other persistent atrial fibrillation: Secondary | ICD-10-CM

## 2017-02-20 DIAGNOSIS — I2510 Atherosclerotic heart disease of native coronary artery without angina pectoris: Secondary | ICD-10-CM | POA: Diagnosis not present

## 2017-02-20 DIAGNOSIS — I252 Old myocardial infarction: Secondary | ICD-10-CM

## 2017-02-20 NOTE — Progress Notes (Signed)
Cardiology Office Note    Date:  02/22/2017   ID:  TOSHIBA NULL, DOB 1920/05/16, MRN [id number]  PCP:  Burnard Bunting, MD  Cardiologist:  Dr. Martinique  Chief Complaint  Patient presents with  . Follow-up    seen for Dr. Martinique.     History of Present Illness:  Julie Lara is a 83 y.o. female with PMH of CAD (s/p BMS to RCA in 2014), PAF (not on systemic anticoagulation 2/2 GIB), moderate AI, hypothyroidism, and stage III CKD. She was admitted in April for evaluation chest pain. Heart rate was 100 at that time. Her Lopressor was increased to 25 mg twice a day. Serial troponin was negative. No further ischemic workup was pursued. She was readmitted recently in November with CHF exacerbation. She was diuresed with IV Lasix. Troponin minimally elevated however had a flat trend. BNP was 940. She was in atrial fibrillation with RVR with heart rate of 120. Echocardiogram obtained on 02/02/2017 showed reduction of EF down to 35-40%, diffuse hypokinesis, moderate LVH, dilated ascending aorta at 43 mm, moderate AI, moderate MR, severely dilated left and right atrium, peak PA pressure 33 mmHg. Her discharge weight was up 127 pounds. She is taking 20 mg Lasix every other day due to renal insufficiency. Note, her metoprolol was previously reduced in May 2018 because her heart rate was down to 44. On the most recent office visit with Dr. Martinique on 02/05/2017, her metoprolol was increased back up to 25 mg twice a day. She returned today for close outpatient visit.  She presents today for cardiology office visit. Despite the fact she had significant bradycardia on the current dose of metoprolol back in May 2018, after her recent increase her metoprolol, heart heart rate is very well controlled in the 80s. She denies any significant weakness, dizziness, blurred vision or feeling of passing out. There is currently no evidence of significant bradycardia. We'll continue on the current dose of metoprolol. She  denies any recent chest discomfort, shortness of breath, lower extremity edema, orthopnea or PND.   Past Medical History:  Diagnosis Date  . Acute blood loss anemia    November 2014  . Aortic insufficiency    mild to moderate  . Arthritis    "right upper arm" (02/01/2017)  . Atrial fibrillation ([place])   . CAD (coronary artery disease)   . Chest pain 04-2010   atypical with negative echocardiogram  . Dilated cardiomyopathy ([place])   . Goiter   . Hypothyroidism   . Idiopathic cardiomyopathy ([place]) 1999   EF 40% by Echo on 10/13/12  . NSTEMI (non-ST elevated myocardial infarction) [facility]) July 2014   95% prox RCA s/p BMS  . PAC (premature atrial contraction)    Chronic    Past Surgical History:  Procedure Laterality Date  . biopsy of sacral lesion  2010  . CARDIAC CATHETERIZATION    . COLONOSCOPY N/A 02/23/2013   Procedure: COLONOSCOPY;  Surgeon: Ladene Artist, MD;  Location: [facility] ENDOSCOPY;  Service: Endoscopy;  Laterality: N/A;  . CORONARY ANGIOPLASTY WITH STENT PLACEMENT  09/2012   BMS-95% prox RCA  . ESOPHAGOGASTRODUODENOSCOPY N/A 02/22/2013   Procedure: ESOPHAGOGASTRODUODENOSCOPY (EGD);  Surgeon: Milus Banister, MD;  Location: [place];  Service: Endoscopy;  Laterality: N/A;  . LEFT HEART CATHETERIZATION WITH CORONARY ANGIOGRAM N/A 10/14/2012   Procedure: LEFT HEART CATHETERIZATION WITH CORONARY ANGIOGRAM;  Surgeon: Wellington Hampshire, MD;  Location: [place] CATH LAB;  Service: Cardiovascular;  Laterality: N/A;  . PERCUTANEOUS CORONARY  STENT INTERVENTION (PCI-S) Right 10/14/2012   Procedure: PERCUTANEOUS CORONARY STENT INTERVENTION (PCI-S);  Surgeon: Wellington Hampshire, MD;  Location: [facility] CATH LAB;  Service: Cardiovascular;  Laterality: Right;  . THYROIDECTOMY  2008  . VAGINAL HYSTERECTOMY     Complete    Current Medications: Outpatient Medications Prior to Visit  Medication Sig Dispense Refill  . aspirin EC 81 MG tablet Take 81 mg by mouth daily.    Marland Kitchen atorvastatin (LIPITOR) 40 MG  tablet Take 1 tablet (40 mg total) by mouth daily at 6 PM. 90 tablet 3  . Cholecalciferol (VITAMIN D PO) Take 1,000 Units by mouth daily.     . Cyanocobalamin (VITAMIN B-12 PO) Take 1 tablet by mouth daily.    . furosemide (LASIX) 20 MG tablet Take 1 tablet (20 mg total) every other day by mouth. Start 02/04/2017. 45 tablet 3  . iron polysaccharides (FERREX 150) 150 MG capsule Take 150 mg by mouth 2 (two) times daily.     Marland Kitchen levothyroxine (SYNTHROID, LEVOTHROID) 25 MCG tablet Take 25 mcg by mouth daily before breakfast.    . metoprolol tartrate (LOPRESSOR) 25 MG tablet Take 1 tablet (25 mg total) 2 (two) times daily by mouth. 90 tablet 3  . nitroGLYCERIN (NITROSTAT) 0.4 MG SL tablet Place 1 tablet (0.4 mg total) under the tongue every 5 (five) minutes as needed for chest pain. 25 tablet 3  . Nutritional Supplements (FEEDING SUPPLEMENT, NEPRO CARB STEADY,) LIQD Take 237 mLs 2 (two) times daily between meals by mouth.     No facility-administered medications prior to visit.      Allergies:   Patient has no known allergies.   Social History   Socioeconomic History  . Marital status: Widowed    Spouse name: None  . Number of children: None  . Years of education: None  . Highest education level: None  Social Needs  . Financial resource strain: None  . Food insecurity - worry: None  . Food insecurity - inability: None  . Transportation needs - medical: None  . Transportation needs - non-medical: None  Occupational History  . Occupation: Retired    Fish farm manager: [employer]  Tobacco Use  . Smoking status: Never Smoker  . Smokeless tobacco: Never Used  Substance and Sexual Activity  . Alcohol use: No  . Drug use: No  . Sexual activity: Not Currently  Other Topics Concern  . None  Social History Narrative   Pt lives alone, but has family close by.     Family History:  The patient's family history includes Diabetes in her sister.   ROS:   Please see the history of present  illness.    ROS All other systems reviewed and are negative.   PHYSICAL EXAM:   VS:  BP 134/76   Pulse 86   Ht 5\' 1"  (1.549 m)   Wt 132 lb (59.9 kg)   BMI 24.94 kg/m    GEN: Well nourished, well developed, in no acute distress  HEENT: normal  Neck: no JVD, carotid bruits, or masses Cardiac: Irregularly irregular; no rubs, or gallops,no edema  1/6 murmur Respiratory:  clear to auscultation bilaterally, normal work of breathing GI: soft, nontender, nondistended, + BS MS: no deformity or atrophy  Skin: warm and dry, no rash Neuro:  Alert and Oriented x 3, Strength and sensation are intact Psych: euthymic mood, full affect  Wt Readings from Last 3 Encounters:  02/20/17 132 lb (59.9 kg)  02/18/17 128 lb 14.4 oz (58.5  kg)  02/05/17 131 lb 6.4 oz (59.6 kg)      Studies/Labs Reviewed:   EKG:  EKG is ordered today.  The ekg ordered today demonstrates atrial fibrillation, heart rate 97  Recent Labs: 07/18/2016: TSH 2.779 02/01/2017: ALT 31; B Natriuretic Peptide 942.7 02/03/2017: Hemoglobin 10.5; Platelets 156 02/20/2017: BUN 31; Creatinine, Ser 1.44; Potassium 5.4; Sodium 142   Lipid Panel    Component Value Date/Time   CHOL 146 12/15/2012 1552   TRIG 54.0 12/15/2012 1552   HDL 64.10 12/15/2012 1552   CHOLHDL 2 12/15/2012 1552   VLDL 10.8 12/15/2012 1552   LDLCALC 71 12/15/2012 1552    Additional studies/ records that were reviewed today include:   Echo 02/02/2017 LV EF: 35% -   40%  Study Conclusions  - Left ventricle: The cavity size was normal. Wall thickness was   increased in a pattern of moderate LVH. Systolic function was   moderately reduced. The estimated ejection fraction was in the   range of 35% to 40%. Diffuse hypokinesis. The study is not   technically sufficient to allow evaluation of LV diastolic   function. - Aortic valve: Trileaflet; mildly calcified leaflets. There was   moderate regurgitation. - Aorta: Ascending aortic diameter: 43 mm (S). -  Ascending aorta: The ascending aorta was mildly dilated. - Mitral valve: Mildly thickened leaflets . There was moderate   regurgitation. - Left atrium: Severely dilated. - Right ventricle: The cavity size was mildly dilated. Systolic   function is mildly reduced. - Right atrium: Severely dilated. - Atrial septum: Bows from left to right, suggesting of high LA >   RA filling pressure. - Tricuspid valve: There was mild regurgitation. - Pulmonary arteries: PA peak pressure: 33 mm Hg (S). - Inferior vena cava: The vessel was dilated. The respirophasic   diameter changes were blunted (< 50%), consistent with elevated   central venous pressure.  Impressions:  - Compared to a prior study in 2014, the LVEF is lower at 35-40%   with diffuse hypokinesis, moderate AI and MR, mild TR, severe   biatrial enlargement. Rhythm appears to be atrial fibrillation.   ASSESSMENT:    1. Coronary artery disease with history of myocardial infarction without history of CABG   2. Persistent atrial fibrillation ([place])   3. Moderate aortic regurgitation   4. Hypothyroidism, unspecified type   5. CKD (chronic kidney disease), stage III ([place])   6. Chronic systolic heart failure (HCC)      PLAN:  In order of problems listed above:  1. CAD: No obvious angina, continue aspirin, Lipitor, and metoprolol.  2. Persistent atrial fibrillation: Her metoprolol was previously reduced back in May 2018 due to significant bradycardia, recently increased again to 25 mg twice a day due to elevated heart rate. Her heart rate is stable without significant bradycardia on today's visit.  3. Chronic systolic heart failure: EF 55-40% on recent echocardiogram. On Lasix 20 mg every other day.  4. Moderate aortic regurgitation: Stable, continue observation.  5. Hypothyroidism: On Synthroid, managed by primary care provider  6. CKD stage III: Obtain basic metabolic panel today    Medication Adjustments/Labs and Tests  Ordered: Current medicines are reviewed at length with the patient today.  Concerns regarding medicines are outlined above.  Medication changes, Labs and Tests ordered today are listed in the Patient Instructions below. Patient Instructions  Medication Instructions:  Your physician recommends that you continue on your current medications as directed. Please refer to the Current Medication list  given to you today.  Labwork: Your physician recommends that you return for lab work in: TODAY-BMET  Testing/Procedures: None   Follow-Up: Your physician recommends that you schedule a follow-up appointment in: 3-4 months with Dr Martinique  Any Other Special Instructions Will Be Listed Below (If Applicable).  If you need a refill on your cardiac medications before your next appointment, please call your pharmacy.     Hilbert Corrigan, [place]  02/22/2017 1:12 PM    [facility] [address], [place], [place]  [address] Phone: [phone]; Fax: [phone]

## 2017-02-20 NOTE — Patient Instructions (Signed)
Medication Instructions:  Your physician recommends that you continue on your current medications as directed. Please refer to the Current Medication list given to you today.  Labwork: Your physician recommends that you return for lab work in: TODAY-BMET  Testing/Procedures: None   Follow-Up: Your physician recommends that you schedule a follow-up appointment in: 3-4 months with Dr Martinique  Any Other Special Instructions Will Be Listed Below (If Applicable).  If you need a refill on your cardiac medications before your next appointment, please call your pharmacy.

## 2017-02-21 LAB — BASIC METABOLIC PANEL
BUN/Creatinine Ratio: 22 (ref 12–28)
BUN: 31 mg/dL (ref 10–36)
CHLORIDE: 103 mmol/L (ref 96–106)
CO2: 26 mmol/L (ref 20–29)
Calcium: 9.6 mg/dL (ref 8.7–10.3)
Creatinine, Ser: 1.44 mg/dL — ABNORMAL HIGH (ref 0.57–1.00)
GFR, EST AFRICAN AMERICAN: 36 mL/min/{1.73_m2} — AB (ref >59)
GFR, EST NON AFRICAN AMERICAN: 31 mL/min/{1.73_m2} — AB (ref >59)
Glucose: 93 mg/dL (ref 65–99)
POTASSIUM: 5.4 mmol/L — AB (ref 3.5–5.2)
SODIUM: 142 mmol/L (ref 134–144)

## 2017-02-22 ENCOUNTER — Encounter: Payer: Self-pay | Admitting: Physician Assistant

## 2017-02-25 ENCOUNTER — Other Ambulatory Visit: Payer: Self-pay | Admitting: *Deleted

## 2017-02-25 NOTE — Patient Outreach (Signed)
[facility]) Care Management  02/25/2017  Julie Lara [birth year] [id number]   Weekly transition of care call placed to member.  Also spoke with daughter, Julie Lara.  Member report she is "doing pretty good."  Denies any shortness of breath, denies swelling in legs/hands/feet.  Report compliance with daily weights and medications, today's weight 131.3.  Confirmed member did have follow up with cardiologist, next follow up in 3-4 months.  Both member and daughter deny concerns at this time, will follow up next week with weekly transition of care call.   [facility] CM Care Plan Problem One     Most Recent Value  Care Plan Problem One  Risk for readmission related to heart failure management as evidenced by recent hospitalization  Role Documenting the Problem One  Care Management [place] for Problem One  Active  [facility] Long Term Goal   Member will not be readmitted to hospital within 31 days of discharge  [facility] Long Term Goal Start Date  02/04/17  Interventions for Problem One Long Term Goal  Daughter educated on heart failure action plan/zones.  Advised of importance of continued follow up with cardiologist and primary MD in effort to manage chronic conditions  [facility] CM Short Term Goal #1   Member will weigh self and record readings daily over the next 4 weeks  [facility] CM Short Term Goal #1 Start Date  02/04/17  Interventions for Short Term Goal #1  Daughter made aware of [facility] calendar tool book with log for daily weights.  Advised of importance of daily weights, educated on when to contact MD with weight gain  [facility] CM Short Term Goal #2   Will keep and attend follow up appointments with primary MD & cardiologist within the next 4 weeks  [facility] CM Short Term Goal #2 Start Date  02/18/17  [facility] CM Short Term Goal #2 Met Date  02/25/17  Interventions for Short Term Goal #2  Confirmed with daughter that member has follow up appointments scheduled as well as transportation for both.   Educated on imporatance of keeping appointments in effort to effectively manage heart failure.      Julie David, MSN, RN [facility] Care Management  [facility] Manager [phone]

## 2017-02-26 ENCOUNTER — Telehealth: Payer: Self-pay | Admitting: *Deleted

## 2017-02-26 DIAGNOSIS — Z79899 Other long term (current) drug therapy: Secondary | ICD-10-CM

## 2017-02-26 NOTE — Telephone Encounter (Signed)
Recommendations discussed with patient's daughter [name], who verbalized understanding and thanks.

## 2017-02-26 NOTE — Telephone Encounter (Signed)
-----   Message from [place], [place] sent at 02/26/2017  3:03 PM EST ----- Change lasix to as needed dose

## 2017-02-26 NOTE — Telephone Encounter (Signed)
Agree with low salt diet and leg elevation. Agree with trial of lasix daily for 3 days, but given her advanced age, she is prone to dehydration. Instead of as needed dose, she can go back to 20mg  every other day of lasix and repeat BMET in 1-2 weeks to assess renal function.

## 2017-02-26 NOTE — Telephone Encounter (Signed)
Spoke w daughter Darryl Lent, who manages patient's meds and care. She states pt still continues to have ankle swelling, and has been dyspneic at night when trying to get to sleep - states she'll sometimes have to take several short breaths to catch her breath and then she's OK.  Informed her I'm not sure what to do as far as medication, if pt already needing to take lasix EOD but having residual ankle swelling, wait for provider advice before dropping dose - will see if OK for her to take daily lasix x2-3 days to see if improvement to swelling & dyspnea.  I also suggested low salt diet & leg elevation during the day, and propping up with pillows for sleep. Daughter voiced understanding. Will follow up with her.

## 2017-02-27 ENCOUNTER — Emergency Department (HOSPITAL_COMMUNITY): Payer: Medicare Other

## 2017-02-27 ENCOUNTER — Encounter (HOSPITAL_COMMUNITY): Payer: Self-pay | Admitting: *Deleted

## 2017-02-27 ENCOUNTER — Emergency Department (HOSPITAL_COMMUNITY)
Admission: EM | Admit: 2017-02-27 | Discharge: 2017-02-27 | Disposition: A | Discharge status: Home / Self Care | Payer: Medicare Other | Attending: Emergency Medicine | Admitting: Emergency Medicine

## 2017-02-27 ENCOUNTER — Other Ambulatory Visit: Payer: Self-pay

## 2017-02-27 DIAGNOSIS — I482 Chronic atrial fibrillation, unspecified: Secondary | ICD-10-CM

## 2017-02-27 DIAGNOSIS — N183 Chronic kidney disease, stage 3 (moderate): Secondary | ICD-10-CM | POA: Insufficient documentation

## 2017-02-27 DIAGNOSIS — I2510 Atherosclerotic heart disease of native coronary artery without angina pectoris: Secondary | ICD-10-CM | POA: Insufficient documentation

## 2017-02-27 DIAGNOSIS — I509 Heart failure, unspecified: Secondary | ICD-10-CM

## 2017-02-27 DIAGNOSIS — I5023 Acute on chronic systolic (congestive) heart failure: Secondary | ICD-10-CM | POA: Insufficient documentation

## 2017-02-27 DIAGNOSIS — E039 Hypothyroidism, unspecified: Secondary | ICD-10-CM | POA: Diagnosis not present

## 2017-02-27 DIAGNOSIS — Z79899 Other long term (current) drug therapy: Secondary | ICD-10-CM | POA: Insufficient documentation

## 2017-02-27 DIAGNOSIS — R0602 Shortness of breath: Secondary | ICD-10-CM | POA: Diagnosis not present

## 2017-02-27 DIAGNOSIS — Z7982 Long term (current) use of aspirin: Secondary | ICD-10-CM | POA: Diagnosis not present

## 2017-02-27 LAB — BASIC METABOLIC PANEL
Anion gap: 9 (ref 5–15)
BUN: 22 mg/dL — AB (ref 6–20)
CALCIUM: 9.1 mg/dL (ref 8.9–10.3)
CO2: 24 mmol/L (ref 22–32)
CREATININE: 1.35 mg/dL — AB (ref 0.44–1.00)
Chloride: 104 mmol/L (ref 101–111)
GFR calc Af Amer: 38 mL/min — ABNORMAL LOW (ref >60)
GFR calc non Af Amer: 32 mL/min — ABNORMAL LOW (ref >60)
GLUCOSE: 122 mg/dL — AB (ref 65–99)
Potassium: 4.1 mmol/L (ref 3.5–5.1)
Sodium: 137 mmol/L (ref 135–145)

## 2017-02-27 LAB — CBC
HCT: 34.8 % — ABNORMAL LOW (ref 36.0–46.0)
HEMOGLOBIN: 11.2 g/dL — AB (ref 12.0–15.0)
MCH: 29.9 pg (ref 26.0–34.0)
MCHC: 32.2 g/dL (ref 30.0–36.0)
MCV: 92.8 fL (ref 78.0–100.0)
PLATELETS: 148 10*3/uL — AB (ref 150–400)
RBC: 3.75 MIL/uL — ABNORMAL LOW (ref 3.87–5.11)
RDW: 15.1 % (ref 11.5–15.5)
WBC: 4.8 10*3/uL (ref 4.0–10.5)

## 2017-02-27 LAB — TROPONIN I

## 2017-02-27 MED ORDER — FUROSEMIDE 20 MG PO TABS
40.0000 mg | ORAL_TABLET | Freq: Once | ORAL | Status: AC
  Administered 2017-02-27: 40 mg via ORAL
  Filled 2017-02-27: qty 2

## 2017-02-27 MED ORDER — METOPROLOL TARTRATE 25 MG PO TABS
50.0000 mg | ORAL_TABLET | Freq: Once | ORAL | Status: AC
  Administered 2017-02-27: 50 mg via ORAL
  Filled 2017-02-27: qty 2

## 2017-02-27 NOTE — Discharge Instructions (Signed)
1.  Monitor your blood pressure and heart rate at home. 2.  Take 20 mg of Lasix twice daily tomorrow. 3.  Resume 20 mg once a day, day after tomorrow 4.  If your heart rate remains greater than 80, increase your metoprolol dose to 50 mg in the morning and 25 mg at night.

## 2017-02-27 NOTE — ED Triage Notes (Signed)
The pt is c/o sob for several days that is worse today no recent cough or cold  Alert no distress

## 2017-02-27 NOTE — ED Provider Notes (Signed)
[facility] [facility] EMERGENCY DEPARTMENT Provider Note   CSN: [id number] Arrival date & time: 02/27/17  1538     History   Chief Complaint Chief Complaint  Patient presents with  . Shortness of Breath    HPI Julie Lara is a 83 y.o. female.  HPI Patient at baseline has congestive heart failure and atrial fibrillation with rapid ventricular response.  For about 2-3 days now the patient is getting increasing shortness of breath particularly at night.  Also some increase in shortness of breath with exertion.  No cough or fever.  No chest pain.  Patient has swelling in her legs although she does not monitor it much.  She has been instructed on elevating the lower extremities.  Patient lives with her daughter who helps administer her medications and communicates with the cardiology group.  At baseline the patient typically takes Lasix 20 mg every other day.  Metoprolol had been increased to 25 mg twice a day a couple weeks ago.  She has been tolerating that without hypotension or bradycardia.  Patient reports that she was discharged from the hospital a couple weeks ago she was told to drink 6 glasses of water every day, not including coffee or tea.  Reports it is hard for her to keep up with taking that much water since she does not like it but she does make some attempt.  Due to the increasing shortness of breath she had been instructed by her cardiology group to take Lasix twice a day yesterday, today and tomorrow.  She felt more short of breath this morning so they presented to the emergency department.  Patient reports other than the shortness of breath, she feels pretty good. Past Medical History:  Diagnosis Date  . Acute blood loss anemia    November 2014  . Aortic insufficiency    mild to moderate  . Arthritis    "right upper arm" (02/01/2017)  . Atrial fibrillation ([place])   . CAD (coronary artery disease)   . Chest pain 04-2010   atypical with negative echocardiogram    . Dilated cardiomyopathy ([place])   . Goiter   . Hypothyroidism   . Idiopathic cardiomyopathy ([place]) 1999   EF 40% by Echo on 10/13/12  . NSTEMI (non-ST elevated myocardial infarction) [facility]) July 2014   95% prox RCA s/p BMS  . PAC (premature atrial contraction)    Chronic    Patient Active Problem List   Diagnosis Date Noted  . Essential hypertension 02/11/2017  . Acute on chronic systolic CHF (congestive heart failure) ([place]) 02/01/2017  . HLD (hyperlipidemia) 02/01/2017  . Atrial fibrillation with RVR ([place])   . Chest pain, rule out acute myocardial infarction 07/18/2016  . Acute blood loss anemia 03/12/2013  . CAD (coronary artery disease)   . Benign neoplasm of colon 02/23/2013  . Nonspecific abnormal finding in stool contents 02/23/2013  . Near syncope 02/21/2013  . Anemia 02/21/2013  . Acute on chronic renal failure ([place]) 02/21/2013  . Chronic systolic congestive heart failure ([place]) 02/21/2013  . GI bleed 02/21/2013  . Chronic anticoagulation 02/21/2013  . Coronary artery disease with history of myocardial infarction without history of CABG 02/21/2013  . Resting chest pain 10/27/2012  . Hypothyroidism 10/15/2012  . Atrial fibrillation ([place]) 10/15/2012  . CKD (chronic kidney disease), stage III ([place]) 10/15/2012  . UTI (urinary tract infection) 10/15/2012  . Dilated cardiomyopathy ([place])   . Aortic insufficiency   . PAC (premature atrial contraction)   . Chest  pain 04/02/2010    Past Surgical History:  Procedure Laterality Date  . biopsy of sacral lesion  2010  . CARDIAC CATHETERIZATION    . COLONOSCOPY N/A 02/23/2013   Procedure: COLONOSCOPY;  Surgeon: Ladene Artist, MD;  Location: [facility] ENDOSCOPY;  Service: Endoscopy;  Laterality: N/A;  . CORONARY ANGIOPLASTY WITH STENT PLACEMENT  09/2012   BMS-95% prox RCA  . ESOPHAGOGASTRODUODENOSCOPY N/A 02/22/2013   Procedure: ESOPHAGOGASTRODUODENOSCOPY (EGD);  Surgeon: Milus Banister, MD;  Location: [place];  Service:  Endoscopy;  Laterality: N/A;  . LEFT HEART CATHETERIZATION WITH CORONARY ANGIOGRAM N/A 10/14/2012   Procedure: LEFT HEART CATHETERIZATION WITH CORONARY ANGIOGRAM;  Surgeon: Wellington Hampshire, MD;  Location: [place] CATH LAB;  Service: Cardiovascular;  Laterality: N/A;  . PERCUTANEOUS CORONARY STENT INTERVENTION (PCI-S) Right 10/14/2012   Procedure: PERCUTANEOUS CORONARY STENT INTERVENTION (PCI-S);  Surgeon: Wellington Hampshire, MD;  Location: [facility] CATH LAB;  Service: Cardiovascular;  Laterality: Right;  . THYROIDECTOMY  2008  . VAGINAL HYSTERECTOMY     Complete    OB History    No data available       Home Medications    Prior to Admission medications   Medication Sig Start Date End Date Taking? Authorizing Provider  aspirin EC 81 MG tablet Take 81 mg by mouth daily.   Yes [provider]  atorvastatin (LIPITOR) 40 MG tablet Take 1 tablet (40 mg total) by mouth daily at 6 PM. 08/28/16  Yes Martinique, Peter M, MD  Cholecalciferol (VITAMIN D PO) Take 1,000 Units by mouth daily.    Yes [provider]  Cyanocobalamin (VITAMIN B-12 PO) Take 1 tablet by mouth daily.   Yes [provider]  furosemide (LASIX) 20 MG tablet Take 1 tablet (20 mg total) every other day by mouth. Start 02/04/2017. 02/05/17 01/31/18 Yes Martinique, Peter M, MD  iron polysaccharides (FERREX 150) 150 MG capsule Take 150 mg by mouth 2 (two) times daily.    Yes [provider]  levothyroxine (SYNTHROID, LEVOTHROID) 25 MCG tablet Take 25 mcg by mouth daily before breakfast.   Yes [provider]  metoprolol tartrate (LOPRESSOR) 25 MG tablet Take 1 tablet (25 mg total) 2 (two) times daily by mouth. 02/05/17  Yes Martinique, Peter M, MD  nitroGLYCERIN (NITROSTAT) 0.4 MG SL tablet Place 1 tablet (0.4 mg total) under the tongue every 5 (five) minutes as needed for chest pain. 08/06/16  Yes Strader, [place] M, PA-C  Nutritional Supplements (FEEDING SUPPLEMENT, NEPRO CARB STEADY,) LIQD Take 237 mLs 2 (two) times  daily between meals by mouth. 02/03/17  Yes Hongalgi, Lenis Dickinson, MD    Family History Family History  Problem Relation Age of Onset  . Diabetes Sister     Social History Social History   Tobacco Use  . Smoking status: Never Smoker  . Smokeless tobacco: Never Used  Substance Use Topics  . Alcohol use: No  . Drug use: No     Allergies   Patient has no known allergies.   Review of Systems Review of Systems 10 Systems reviewed and are negative for acute change except as noted in the HPI.   Physical Exam Updated Vital Signs BP (!) 142/96   Pulse 90   Temp (!) 97.5 F (36.4 C) (Oral)   Resp (!) 24   Ht 5\' 1"  (1.549 m)   Wt 59.9 kg (132 lb)   SpO2 100%   BMI 24.94 kg/m   Physical Exam  Constitutional: She is oriented to person,  place, and time.  Patient is alert and nontoxic.  Excellent condition for age.  Tachypnea with mild increased work of breathing but no significant distress.  HENT:  Head: Normocephalic and atraumatic.  Eyes: EOM are normal.  Neck: Neck supple.  Cardiovascular:  Irregularly irregular heart rate 90s to low 100  Pulmonary/Chest:  Mild tachypnea.  Diminished breath sounds at bases.  No gross wheeze rhonchi or rale.  Abdominal: Soft. She exhibits no distension. There is no tenderness. There is no guarding.  Musculoskeletal:  2+ pitting edema bilateral lower extremities.  Calves nontender.  Neurological: She is alert and oriented to person, place, and time. No cranial nerve deficit. She exhibits normal muscle tone. Coordination normal.  Skin: Skin is warm and dry.  Psychiatric: She has a normal mood and affect.     ED Treatments / Results  Labs (all labs ordered are listed, but only abnormal results are displayed) Labs Reviewed  BASIC METABOLIC PANEL - Abnormal; Notable for the following components:      Result Value   Glucose, Bld 122 (*)    BUN 22 (*)    Creatinine, Ser 1.35 (*)    GFR calc non Af Amer 32 (*)    GFR calc Af Amer 38  (*)    All other components within normal limits  CBC - Abnormal; Notable for the following components:   RBC 3.75 (*)    Hemoglobin 11.2 (*)    HCT 34.8 (*)    Platelets 148 (*)    All other components within normal limits  TROPONIN I    EKG  EKG Interpretation  Date/Time:  Wednesday February 27 2017 15:54:31 EST Ventricular Rate:  93 PR Interval:    QRS Duration: 110 QT Interval:  412 QTC Calculation: 512 R Axis:   -10 Text Interpretation:  Atrial fibrillation with premature ventricular or aberrantly conducted complexes Incomplete right bundle branch block no sig change from previous Confirmed by Charlesetta Shanks [phone]) on 02/27/2017 7:44:46 PM       Radiology Dg Chest 2 View  Result Date: 02/27/2017 CLINICAL DATA:  Shortness of breath for several days EXAM: CHEST  2 VIEW COMPARISON:  02/01/2017 FINDINGS: Cardiomegaly. Cardiomegaly and vascular pedicle widening. Indistinctness of the hila with cephalized blood flow. No Kerley lines. No effusion or pneumothorax. Chronic lower thoracic wedging. IMPRESSION: Cardiomegaly and vascular congestion. Electronically Signed   By: Monte Fantasia M.D.   On: 02/27/2017 16:34    Procedures Procedures (including critical care time)  Medications Ordered in ED Medications  metoprolol tartrate (LOPRESSOR) tablet 50 mg (50 mg Oral Given 02/27/17 2032)  furosemide (LASIX) tablet 40 mg (40 mg Oral Given 02/27/17 2031)     Initial Impression / Assessment and Plan / ED Course  I have reviewed the triage vital signs and the nursing notes.  Pertinent labs & imaging results that were available during my care of the patient were reviewed by me and considered in my medical decision making (see chart for details).     Final Clinical Impressions(s) / ED Diagnoses   Final diagnoses:  Acute on chronic congestive heart failure, unspecified heart failure type ([place])  Chronic atrial fibrillation [facility])   Patient has family members who are very  involved in assisting with care.  Interestingly, patient does sound like she is taking a fairly high volume of oral free water and fluids.  She is doing this based on her understanding of discharge instructions from her prior hospitalization.  Findings are consistent with congestive  heart failure acute on chronic.  Exacerbation appears to be fairly mild.  Patient has been tolerating metoprolol 25 twice daily without bradycardia.  Heart rate currently is 90s to low 100s.  After discussing plan and management with family, they feel comfortable taking the patient home and making medication adjustments.  Patient is given 1 dose of metoprolol 50 mg and Lasix 40 mg p.o. just prior to discharge.  They will continue to monitor blood pressure and heart rate at home.  Instructions have been provided for increasing 1 of her metoprolol doses to 50 mg if heart rate remains greater than 70.  Patient will also take a 40 mg dose of Lasix split tomorrow.  He is instructed to return to regular Lasix dosing and have close follow-up with cardiology.  Also we reviewed decreasing free water intake to only drinking ad lib. and not forcing fluids. ED Discharge Orders    None       Charlesetta Shanks, MD 02/27/17 2045

## 2017-03-01 ENCOUNTER — Telehealth (HOSPITAL_COMMUNITY): Payer: Self-pay | Admitting: *Deleted

## 2017-03-01 NOTE — Telephone Encounter (Signed)
Called to sched pt for f/u/ Pt on ED discharge report.  Pt has chronic afib and has just seen Almyra Deforest 11/26 then seen in ED for same symptoms 11/28.  Daughter states she is stable as of now, not in any distress or feeling bad.  She has a f/u with her PCP Monday and if she needs cards f/u sooner than March, she will find out what PCP recommends.  FYI

## 2017-03-04 ENCOUNTER — Encounter: Payer: Self-pay | Admitting: *Deleted

## 2017-03-04 ENCOUNTER — Other Ambulatory Visit: Payer: Self-pay | Admitting: *Deleted

## 2017-03-04 DIAGNOSIS — N183 Chronic kidney disease, stage 3 (moderate): Secondary | ICD-10-CM | POA: Diagnosis not present

## 2017-03-04 DIAGNOSIS — I5022 Chronic systolic (congestive) heart failure: Secondary | ICD-10-CM | POA: Diagnosis not present

## 2017-03-04 DIAGNOSIS — E038 Other specified hypothyroidism: Secondary | ICD-10-CM | POA: Diagnosis not present

## 2017-03-04 DIAGNOSIS — Z6823 Body mass index (BMI) 23.0-23.9, adult: Secondary | ICD-10-CM | POA: Diagnosis not present

## 2017-03-04 DIAGNOSIS — I4891 Unspecified atrial fibrillation: Secondary | ICD-10-CM | POA: Diagnosis not present

## 2017-03-04 DIAGNOSIS — D6489 Other specified anemias: Secondary | ICD-10-CM | POA: Diagnosis not present

## 2017-03-04 DIAGNOSIS — I129 Hypertensive chronic kidney disease with stage 1 through stage 4 chronic kidney disease, or unspecified chronic kidney disease: Secondary | ICD-10-CM | POA: Diagnosis not present

## 2017-03-04 NOTE — Telephone Encounter (Signed)
This encounter was created in error - please disregard.

## 2017-03-04 NOTE — Patient Outreach (Signed)
[place] [facility]) Care Management  03/04/2017  SOPHEAP BASIC 05/02/1920 [id number]   Weekly transition of care call placed to [facility] daughter/caregiver, [name].  No answer, HIPAA compliant voice message left.  Will await call back, if no call back, will follow up next week.  Valente David, MSN, RN [facility] Care Management  [facility] Manager [phone]

## 2017-03-06 DIAGNOSIS — I420 Dilated cardiomyopathy: Secondary | ICD-10-CM | POA: Diagnosis not present

## 2017-03-06 DIAGNOSIS — I129 Hypertensive chronic kidney disease with stage 1 through stage 4 chronic kidney disease, or unspecified chronic kidney disease: Secondary | ICD-10-CM | POA: Diagnosis not present

## 2017-03-06 DIAGNOSIS — C801 Malignant (primary) neoplasm, unspecified: Secondary | ICD-10-CM | POA: Diagnosis not present

## 2017-03-06 DIAGNOSIS — I10 Essential (primary) hypertension: Secondary | ICD-10-CM | POA: Diagnosis not present

## 2017-03-06 DIAGNOSIS — M5136 Other intervertebral disc degeneration, lumbar region: Secondary | ICD-10-CM | POA: Diagnosis not present

## 2017-03-06 DIAGNOSIS — D6489 Other specified anemias: Secondary | ICD-10-CM | POA: Diagnosis not present

## 2017-03-06 DIAGNOSIS — E559 Vitamin D deficiency, unspecified: Secondary | ICD-10-CM | POA: Diagnosis not present

## 2017-03-06 DIAGNOSIS — Z6824 Body mass index (BMI) 24.0-24.9, adult: Secondary | ICD-10-CM | POA: Diagnosis not present

## 2017-03-06 DIAGNOSIS — E7849 Other hyperlipidemia: Secondary | ICD-10-CM | POA: Diagnosis not present

## 2017-03-06 DIAGNOSIS — E038 Other specified hypothyroidism: Secondary | ICD-10-CM | POA: Diagnosis not present

## 2017-03-06 DIAGNOSIS — I4891 Unspecified atrial fibrillation: Secondary | ICD-10-CM | POA: Diagnosis not present

## 2017-03-06 DIAGNOSIS — N183 Chronic kidney disease, stage 3 (moderate): Secondary | ICD-10-CM | POA: Diagnosis not present

## 2017-03-07 ENCOUNTER — Encounter (HOSPITAL_COMMUNITY): Payer: Self-pay | Admitting: *Deleted

## 2017-03-07 ENCOUNTER — Ambulatory Visit: Payer: Self-pay | Admitting: *Deleted

## 2017-03-07 ENCOUNTER — Other Ambulatory Visit: Payer: Self-pay | Admitting: *Deleted

## 2017-03-07 ENCOUNTER — Inpatient Hospital Stay (HOSPITAL_COMMUNITY)
Admission: EM | Admit: 2017-03-07 | Discharge: 2017-03-09 | DRG: 291 | Disposition: A | Discharge status: Home Health | Payer: Medicare Other | Attending: Internal Medicine | Admitting: Internal Medicine

## 2017-03-07 ENCOUNTER — Emergency Department (HOSPITAL_COMMUNITY): Payer: Medicare Other

## 2017-03-07 ENCOUNTER — Ambulatory Visit: Payer: Medicare Other | Admitting: Physical Therapy

## 2017-03-07 ENCOUNTER — Telehealth: Payer: Self-pay | Admitting: Cardiology

## 2017-03-07 DIAGNOSIS — E890 Postprocedural hypothyroidism: Secondary | ICD-10-CM | POA: Diagnosis present

## 2017-03-07 DIAGNOSIS — R0602 Shortness of breath: Secondary | ICD-10-CM | POA: Diagnosis not present

## 2017-03-07 DIAGNOSIS — Z7982 Long term (current) use of aspirin: Secondary | ICD-10-CM

## 2017-03-07 DIAGNOSIS — I4891 Unspecified atrial fibrillation: Secondary | ICD-10-CM | POA: Diagnosis present

## 2017-03-07 DIAGNOSIS — K219 Gastro-esophageal reflux disease without esophagitis: Secondary | ICD-10-CM | POA: Diagnosis present

## 2017-03-07 DIAGNOSIS — N183 Chronic kidney disease, stage 3 unspecified: Secondary | ICD-10-CM | POA: Diagnosis present

## 2017-03-07 DIAGNOSIS — Z79899 Other long term (current) drug therapy: Secondary | ICD-10-CM | POA: Diagnosis not present

## 2017-03-07 DIAGNOSIS — Z90710 Acquired absence of both cervix and uterus: Secondary | ICD-10-CM

## 2017-03-07 DIAGNOSIS — I252 Old myocardial infarction: Secondary | ICD-10-CM

## 2017-03-07 DIAGNOSIS — I5023 Acute on chronic systolic (congestive) heart failure: Secondary | ICD-10-CM | POA: Diagnosis present

## 2017-03-07 DIAGNOSIS — J9601 Acute respiratory failure with hypoxia: Secondary | ICD-10-CM | POA: Diagnosis present

## 2017-03-07 DIAGNOSIS — I2510 Atherosclerotic heart disease of native coronary artery without angina pectoris: Secondary | ICD-10-CM | POA: Diagnosis not present

## 2017-03-07 DIAGNOSIS — K222 Esophageal obstruction: Secondary | ICD-10-CM | POA: Diagnosis present

## 2017-03-07 DIAGNOSIS — I10 Essential (primary) hypertension: Secondary | ICD-10-CM

## 2017-03-07 DIAGNOSIS — I130 Hypertensive heart and chronic kidney disease with heart failure and stage 1 through stage 4 chronic kidney disease, or unspecified chronic kidney disease: Principal | ICD-10-CM | POA: Diagnosis present

## 2017-03-07 DIAGNOSIS — I480 Paroxysmal atrial fibrillation: Secondary | ICD-10-CM | POA: Diagnosis not present

## 2017-03-07 DIAGNOSIS — R1310 Dysphagia, unspecified: Secondary | ICD-10-CM

## 2017-03-07 DIAGNOSIS — I509 Heart failure, unspecified: Secondary | ICD-10-CM | POA: Diagnosis not present

## 2017-03-07 DIAGNOSIS — I351 Nonrheumatic aortic (valve) insufficiency: Secondary | ICD-10-CM | POA: Diagnosis present

## 2017-03-07 DIAGNOSIS — I420 Dilated cardiomyopathy: Secondary | ICD-10-CM | POA: Diagnosis present

## 2017-03-07 DIAGNOSIS — Z955 Presence of coronary angioplasty implant and graft: Secondary | ICD-10-CM | POA: Diagnosis not present

## 2017-03-07 DIAGNOSIS — Z79890 Hormone replacement therapy (postmenopausal): Secondary | ICD-10-CM | POA: Diagnosis not present

## 2017-03-07 DIAGNOSIS — E039 Hypothyroidism, unspecified: Secondary | ICD-10-CM | POA: Diagnosis present

## 2017-03-07 DIAGNOSIS — Z833 Family history of diabetes mellitus: Secondary | ICD-10-CM

## 2017-03-07 DIAGNOSIS — D631 Anemia in chronic kidney disease: Secondary | ICD-10-CM | POA: Diagnosis present

## 2017-03-07 LAB — CBC
HEMATOCRIT: 33.3 % — AB (ref 36.0–46.0)
HEMOGLOBIN: 10.7 g/dL — AB (ref 12.0–15.0)
MCH: 29.7 pg (ref 26.0–34.0)
MCHC: 32.1 g/dL (ref 30.0–36.0)
MCV: 92.5 fL (ref 78.0–100.0)
Platelets: 152 10*3/uL (ref 150–400)
RBC: 3.6 MIL/uL — AB (ref 3.87–5.11)
RDW: 15.4 % (ref 11.5–15.5)
WBC: 5.2 10*3/uL (ref 4.0–10.5)

## 2017-03-07 LAB — BASIC METABOLIC PANEL
ANION GAP: 8 (ref 5–15)
BUN: 29 mg/dL — ABNORMAL HIGH (ref 6–20)
CALCIUM: 9.3 mg/dL (ref 8.9–10.3)
CHLORIDE: 105 mmol/L (ref 101–111)
CO2: 24 mmol/L (ref 22–32)
Creatinine, Ser: 1.25 mg/dL — ABNORMAL HIGH (ref 0.44–1.00)
GFR calc Af Amer: 41 mL/min — ABNORMAL LOW (ref >60)
GFR calc non Af Amer: 36 mL/min — ABNORMAL LOW (ref >60)
GLUCOSE: 127 mg/dL — AB (ref 65–99)
POTASSIUM: 4.6 mmol/L (ref 3.5–5.1)
Sodium: 137 mmol/L (ref 135–145)

## 2017-03-07 LAB — I-STAT TROPONIN, ED: Troponin i, poc: 0.02 ng/mL (ref 0.00–0.08)

## 2017-03-07 LAB — BRAIN NATRIURETIC PEPTIDE: B Natriuretic Peptide: 1582.9 pg/mL — ABNORMAL HIGH (ref 0.0–100.0)

## 2017-03-07 MED ORDER — ATORVASTATIN CALCIUM 40 MG PO TABS
40.0000 mg | ORAL_TABLET | Freq: Every day | ORAL | Status: DC
  Administered 2017-03-08: 40 mg via ORAL
  Filled 2017-03-07 (×2): qty 1

## 2017-03-07 MED ORDER — ONDANSETRON HCL 4 MG/2ML IJ SOLN: 4.0000 mg | Freq: Four times a day (QID) | INTRAMUSCULAR | Status: DC | PRN

## 2017-03-07 MED ORDER — NITROGLYCERIN 2 % TD OINT
0.5000 [in_us] | TOPICAL_OINTMENT | Freq: Four times a day (QID) | TRANSDERMAL | Status: DC
  Administered 2017-03-07 – 2017-03-09 (×6): 0.5 [in_us] via TOPICAL
  Filled 2017-03-07 (×2): qty 1
  Filled 2017-03-07: qty 30
  Filled 2017-03-07: qty 1

## 2017-03-07 MED ORDER — ALPRAZOLAM 0.25 MG PO TABS: 0.2500 mg | ORAL_TABLET | Freq: Two times a day (BID) | ORAL | Status: DC | PRN

## 2017-03-07 MED ORDER — ACETAMINOPHEN 325 MG PO TABS: 650.0000 mg | ORAL_TABLET | ORAL | Status: DC | PRN

## 2017-03-07 MED ORDER — METOPROLOL TARTRATE 25 MG PO TABS
37.5000 mg | ORAL_TABLET | Freq: Two times a day (BID) | ORAL | Status: DC
  Administered 2017-03-07 – 2017-03-09 (×4): 37.5 mg via ORAL
  Filled 2017-03-07: qty 1
  Filled 2017-03-07 (×2): qty 2
  Filled 2017-03-07: qty 1

## 2017-03-07 MED ORDER — SODIUM CHLORIDE 0.9% FLUSH
3.0000 mL | Freq: Two times a day (BID) | INTRAVENOUS | Status: DC
  Administered 2017-03-07 – 2017-03-08 (×3): 3 mL via INTRAVENOUS

## 2017-03-07 MED ORDER — SODIUM CHLORIDE 0.9 % IV SOLN: 250.0000 mL | INTRAVENOUS | Status: DC | PRN

## 2017-03-07 MED ORDER — FUROSEMIDE 10 MG/ML IJ SOLN
40.0000 mg | Freq: Two times a day (BID) | INTRAMUSCULAR | Status: DC
  Administered 2017-03-08 (×2): 40 mg via INTRAVENOUS
  Filled 2017-03-07 (×2): qty 4

## 2017-03-07 MED ORDER — ASPIRIN EC 81 MG PO TBEC
81.0000 mg | DELAYED_RELEASE_TABLET | Freq: Every day | ORAL | Status: DC
  Administered 2017-03-08 – 2017-03-09 (×2): 81 mg via ORAL
  Filled 2017-03-07 (×2): qty 1

## 2017-03-07 MED ORDER — LEVOTHYROXINE SODIUM 25 MCG PO TABS
25.0000 ug | ORAL_TABLET | Freq: Every day | ORAL | Status: DC
  Administered 2017-03-08 – 2017-03-09 (×2): 25 ug via ORAL
  Filled 2017-03-07 (×3): qty 1

## 2017-03-07 MED ORDER — FUROSEMIDE 10 MG/ML IJ SOLN
40.0000 mg | Freq: Once | INTRAMUSCULAR | Status: AC
  Administered 2017-03-07: 40 mg via INTRAVENOUS
  Filled 2017-03-07: qty 4

## 2017-03-07 MED ORDER — METOPROLOL TARTRATE 25 MG PO TABS
25.0000 mg | ORAL_TABLET | Freq: Two times a day (BID) | ORAL | Status: DC
Start: 2017-03-07 — End: 2017-03-07

## 2017-03-07 MED ORDER — NEPRO/CARBSTEADY PO LIQD
237.0000 mL | Freq: Two times a day (BID) | ORAL | Status: DC
  Filled 2017-03-07 (×6): qty 237

## 2017-03-07 MED ORDER — HEPARIN SODIUM (PORCINE) 5000 UNIT/ML IJ SOLN
5000.0000 [IU] | Freq: Three times a day (TID) | INTRAMUSCULAR | Status: DC
  Administered 2017-03-07 – 2017-03-09 (×5): 5000 [IU] via SUBCUTANEOUS
  Filled 2017-03-07 (×5): qty 1

## 2017-03-07 MED ORDER — SODIUM CHLORIDE 0.9% FLUSH: 3.0000 mL | INTRAVENOUS | Status: DC | PRN

## 2017-03-07 NOTE — H&P (Signed)
History and Physical    Julie Lara YQI:[id number] DOB: 1920/06/19 DOA: 03/07/2017  PCP: Burnard Bunting, MD   Patient coming from: Home  Chief Complaint: SOB, leg swelling, weight gain  HPI: Julie Lara is a 83 y.o. female with medical history significant for coronary artery disease, chronic systolic CHF, atrial fibrillation not anticoagulated, and hypothyroidism, now presenting to the emergency department for evaluation of aggressive shortness of breath, lower extremity swelling, and weight gain over the past week.  Patient reportedly had her Lasix doubled recently by her cardiologist, but has continued to experience progressive dyspnea over the past week with increased bilateral lower extremity edema and a 7 pound weight gain.  With her symptoms continuing to worsen, and the patient in apparent distress, family called 911.  EMS found the patient to be hypoxic, placed on supplemental oxygen, and transported her to the hospital.  She denies any chest pain or significant cough.  Denies fevers or chills.  ED Course: Upon arrival to the ED, patient is found to be afebrile, requiring 4 L/min of supplemental oxygen in order to maintain adequate saturations, tachycardic in the low 100s, and modestly hypertensive.  EKG features atrial fibrillation with rate 105 and QTc interval of 516 ms.  Chest x-ray is notable for a small right pleural effusion.  Chemistry panel reveals a creatinine 1.25, consistent with her apparent baseline.  CBC is notable for a stable chronic normocytic anemia with hemoglobin of 10.7.  Troponin is within normal limits and BNP is elevated at 1583.  Patient was treated with 40 mg IV Lasix and glycerin ointment in the ED.  Heart rate remained between 100 and 120, blood pressure remained stable, patient is not in any acute distress, and she will be admitted to the telemetry unit for ongoing evaluation and management of acute on chronic systolic CHF with hypoxia.  Review of  Systems:  All other systems reviewed and apart from HPI, are negative.  Past Medical History:  Diagnosis Date  . Acute blood loss anemia    November 2014  . Aortic insufficiency    mild to moderate  . Arthritis    "right upper arm" (02/01/2017)  . Atrial fibrillation ([place])   . CAD (coronary artery disease)   . Chest pain 04-2010   atypical with negative echocardiogram  . Dilated cardiomyopathy ([place])   . Goiter   . Hypothyroidism   . Idiopathic cardiomyopathy ([place]) 1999   EF 40% by Echo on 10/13/12  . NSTEMI (non-ST elevated myocardial infarction) [facility]) July 2014   95% prox RCA s/p BMS  . PAC (premature atrial contraction)    Chronic    Past Surgical History:  Procedure Laterality Date  . biopsy of sacral lesion  2010  . CARDIAC CATHETERIZATION    . COLONOSCOPY N/A 02/23/2013   Procedure: COLONOSCOPY;  Surgeon: Ladene Artist, MD;  Location: [facility] ENDOSCOPY;  Service: Endoscopy;  Laterality: N/A;  . CORONARY ANGIOPLASTY WITH STENT PLACEMENT  09/2012   BMS-95% prox RCA  . ESOPHAGOGASTRODUODENOSCOPY N/A 02/22/2013   Procedure: ESOPHAGOGASTRODUODENOSCOPY (EGD);  Surgeon: Milus Banister, MD;  Location: [place];  Service: Endoscopy;  Laterality: N/A;  . LEFT HEART CATHETERIZATION WITH CORONARY ANGIOGRAM N/A 10/14/2012   Procedure: LEFT HEART CATHETERIZATION WITH CORONARY ANGIOGRAM;  Surgeon: Wellington Hampshire, MD;  Location: [place] CATH LAB;  Service: Cardiovascular;  Laterality: N/A;  . PERCUTANEOUS CORONARY STENT INTERVENTION (PCI-S) Right 10/14/2012   Procedure: PERCUTANEOUS CORONARY STENT INTERVENTION (PCI-S);  Surgeon: Wellington Hampshire, MD;  Location: [place] CATH LAB;  Service: Cardiovascular;  Laterality: Right;  . THYROIDECTOMY  2008  . VAGINAL HYSTERECTOMY     Complete     reports that  has never smoked. she has never used smokeless tobacco. She reports that she does not drink alcohol or use drugs.  No Known Allergies  Family History  Problem Relation Age of Onset  .  Diabetes Sister      Prior to Admission medications   Medication Sig Start Date End Date Taking? Authorizing Provider  aspirin EC 81 MG tablet Take 81 mg by mouth daily.   Yes [provider]  atorvastatin (LIPITOR) 40 MG tablet Take 1 tablet (40 mg total) by mouth daily at 6 PM. 08/28/16  Yes Martinique, Peter M, MD  Cholecalciferol (VITAMIN D PO) Take 1,000 Units by mouth daily.    Yes [provider]  Cyanocobalamin (VITAMIN B-12 PO) Take 1 tablet by mouth daily.   Yes [provider]  furosemide (LASIX) 20 MG tablet Take 1 tablet (20 mg total) every other day by mouth. Start 02/04/2017. 02/05/17 01/31/18 Yes Martinique, Peter M, MD  iron polysaccharides (FERREX 150) 150 MG capsule Take 150 mg by mouth 2 (two) times daily.    Yes [provider]  levothyroxine (SYNTHROID, LEVOTHROID) 25 MCG tablet Take 25 mcg by mouth daily before breakfast.   Yes [provider]  metoprolol tartrate (LOPRESSOR) 25 MG tablet Take 1 tablet (25 mg total) 2 (two) times daily by mouth. 02/05/17  Yes Martinique, Peter M, MD  nitroGLYCERIN (NITROSTAT) 0.4 MG SL tablet Place 1 tablet (0.4 mg total) under the tongue every 5 (five) minutes as needed for chest pain. 08/06/16  Yes Strader, [place] M, PA-C  Nutritional Supplements (FEEDING SUPPLEMENT, NEPRO CARB STEADY,) LIQD Take 237 mLs 2 (two) times daily between meals by mouth. 02/03/17  Yes Modena Jansky, MD    Physical Exam: Vitals:   03/07/17 1814 03/07/17 1817 03/07/17 1830 03/07/17 2000  BP:  (!) 126/106  (!) 132/93  Pulse:  (!) 101  98  Resp:  20  (!) 22  Temp:   97.7 F (36.5 C)   TempSrc:   Oral   SpO2: 94% 95%  100%      Constitutional: No acute distress, calm, appears quite well for stated age  Eyes: PERTLA, lids and conjunctivae normal ENMT: Mucous membranes are moist. Posterior pharynx clear of any exudate or lesions.   Neck: normal, supple, no masses, no thyromegaly Respiratory: Diminished on right, no wheeze  or rhonchi. Dyspnea with speech. No accessory muscle use.  Cardiovascular: Rate ~110 and irregular. Pretibial pitting edema bilaterally.  Abdomen: No distension, no tenderness, no masses palpated. Bowel sounds normal.  Musculoskeletal: no clubbing / cyanosis. No joint deformity upper and lower extremities.    Skin: no significant rashes, lesions, ulcers. Warm, dry, well-perfused. Neurologic: CN 2-12 grossly intact. Sensation intact. Strength 5/5 in all 4 limbs.  Psychiatric: Alert and oriented x 3. Pleasant, cooperative.     Labs on Admission: I have personally reviewed following labs and imaging studies  CBC: Recent Labs  Lab 03/07/17 1906  WBC 5.2  HGB 10.7*  HCT 33.3*  MCV 92.5  PLT 798   Basic Metabolic Panel: Recent Labs  Lab 03/07/17 1906  NA 137  K 4.6  CL 105  CO2 24  GLUCOSE 127*  BUN 29*  CREATININE 1.25*  CALCIUM 9.3   GFR: Estimated Creatinine Clearance: 22.9 mL/min (A) (by C-G formula based on  SCr of 1.25 mg/dL (H)). Liver Function Tests: No results for input(s): AST, ALT, ALKPHOS, BILITOT, PROT, ALBUMIN in the last 168 hours. No results for input(s): LIPASE, AMYLASE in the last 168 hours. No results for input(s): AMMONIA in the last 168 hours. Coagulation Profile: No results for input(s): INR, PROTIME in the last 168 hours. Cardiac Enzymes: No results for input(s): CKTOTAL, CKMB, CKMBINDEX, TROPONINI in the last 168 hours. BNP (last 3 results) No results for input(s): PROBNP in the last 8760 hours. HbA1C: No results for input(s): HGBA1C in the last 72 hours. CBG: No results for input(s): GLUCAP in the last 168 hours. Lipid Profile: No results for input(s): CHOL, HDL, LDLCALC, TRIG, CHOLHDL, LDLDIRECT in the last 72 hours. Thyroid Function Tests: No results for input(s): TSH, T4TOTAL, FREET4, T3FREE, THYROIDAB in the last 72 hours. Anemia Panel: No results for input(s): VITAMINB12, FOLATE, FERRITIN, TIBC, IRON, RETICCTPCT in the last 72  hours. Urine analysis:    Component Value Date/Time   COLORURINE YELLOW 10/14/2012 2010   APPEARANCEUR HAZY (A) 10/14/2012 2010   LABSPEC 1.017 10/14/2012 2010   PHURINE 5.5 10/14/2012 2010   GLUCOSEU NEGATIVE 10/14/2012 2010   HGBUR TRACE (A) 10/14/2012 2010   BILIRUBINUR NEGATIVE 10/14/2012 2010   KETONESUR NEGATIVE 10/14/2012 2010   PROTEINUR NEGATIVE 10/14/2012 2010   UROBILINOGEN 0.2 10/14/2012 2010   NITRITE POSITIVE (A) 10/14/2012 2010   LEUKOCYTESUR MODERATE (A) 10/14/2012 2010   Sepsis Labs: @LABRCNTIP (procalcitonin:4,lacticidven:4) )No results found for this or any previous visit (from the past 240 hour(s)).   Radiological Exams on Admission: Dg Chest 2 View  Result Date: 03/07/2017 CLINICAL DATA:  Shortness of breath and weakness for 1 week EXAM: CHEST  2 VIEW COMPARISON:  02/27/2017 FINDINGS: Cardiac shadow is enlarged. Lungs are well aerated bilaterally. Small right-sided pleural effusion is noted. No focal infiltrate is noted. No acute bony abnormality is seen. IMPRESSION: Small right pleural effusion.  No focal infiltrate is seen. Electronically Signed   By: Inez Catalina M.D.   On: 03/07/2017 20:14    EKG: Independently reviewed. Atrial fibrillation, rate 105, QTc 516 ms.   Assessment/Plan  1. Acute on chronic systolic CHF  - Pt presents with 1 wk of progressive SOB and leg swelling, reporting 7 lb wt gain over this interval despite recent doubling of her Lasix  - Denies dietary indiscretions, family has been helping her avoid salt or excess fluid - A fib with rapid rate may be contributing; HR 100-120 in ED  - TTE (02/02/17) with EF 35-40%, diffuse HK, mild TR, moderate AI and MR, severe LAE  - Given Lasix 40 mg IV in ED  - Plan to continue cardiac monitoring, SLIV, fluid-restrict diet, follow daily wts and I/O's, continue diuresis with Lasix 40 mg IV q12h, increase Lopressor in light of rapid HR, follow daily chem panel   2. Atrial fibrillation - Pt is in  atrial fibrillation on admission with rate 100-120 in ED  - CHADS-VASc 5 (age x2, gender, HTN, CHF)  - Follows with cardiology, not anticoagulated, presumably d/t age  - Lopressor reportedly lowered last May d/t bradycardia, will increase by 12.5 mg BID for now in setting of rapid rate and HTN   3. CKD stage III  - SCr is 1.25 on admission, consistent with her apparent baseline  - Renally-dose medications, follow daily chem panel during diuresis    4. CAD - No anginal complaints  - Troponin negative  - Continue ASA, statin, beta-blocker    5. Hypertension  -  DBP in low 100's in ED and nitroglycerin ointment placed  - Plan to increase Lopressor for now as above in light of rapid rate     DVT prophylaxis: sq heparin  Code Status: Full  Family Communication: Daughters updated at bedside Disposition Plan: Admit to telemetry Consults called: None Admission status: Inpatient    Vianne Bulls, MD [facility] Pager [phone]  If 7PM-7AM, please contact night-coverage [email or web address] Password [email or web address]  03/07/2017, 9:02 PM

## 2017-03-07 NOTE — Patient Outreach (Signed)
[facility]) Care Management  03/07/2017  Julie Lara 05-16-1920 [id number]   Voice message received from daughter requesting call due to member having symptoms related to heart failure exacerbation.  Call placed to daughter, Julie Lara.  She report that over the past week member has been having increasing shortness of breath and increased swelling in her legs.  She state that she has reached out to the cardiology office for further instructions but request this care manager visit member.  She also report appointment with primary MD on yesterday.  This care manager agrees to acute home visit tomorrow morning, daughter will continue to monitor and await call back from MD office.  THN CM Care Plan Problem One     Most Recent Value  Care Plan Problem One  Risk for readmission related to heart failure management as evidenced by recent hospitalization  Role Documenting the Problem One  Care Management [place] for Problem One  Not Active  [facility] Long Term Goal   Member will not be readmitted to hospital within 31 days of discharge  [facility] Long Term Goal Start Date  02/04/17  [facility] Long Term Goal Met Date  03/07/17  Interventions for Problem One Long Term Goal  Daughter educated on heart failure action plan/zones.  Advised of importance of continued follow up with cardiologist and primary MD in effort to manage chronic conditions  THN CM Short Term Goal #1   Member will weigh self and record readings daily over the next 4 weeks  THN CM Short Term Goal #1 Start Date  02/04/17  [facility] CM Short Term Goal #1 Met Date  03/07/17  Interventions for Short Term Goal #1  Daughter made aware of [facility] calendar tool book with log for daily weights.  Advised of importance of daily weights, educated on when to contact MD with weight gain  THN CM Short Term Goal #2   Will keep and attend follow up appointments with primary MD & cardiologist within the next 4 weeks  THN CM Short Term Goal #2 Start  Date  02/18/17  [facility] CM Short Term Goal #2 Met Date  02/25/17  Interventions for Short Term Goal #2  Confirmed with daughter that member has follow up appointments scheduled as well as transportation for both.  Educated on imporatance of keeping appointments in effort to effectively manage heart failure.     Valente David, [place], MSN [place] [phone]

## 2017-03-07 NOTE — ED Triage Notes (Signed)
Per EMS- pt is from home with increased SOB and weakness for one week. Pt was diagnosed with CHF last week. Pt reports cough. Pt is in afib with hx of the same.

## 2017-03-07 NOTE — Telephone Encounter (Signed)
Spoke with pt dtr, she reports the patient has SOB and swelling that is getting worse. She reports the patient is breathing hard when sleeping and then it seems like she almost stops breathing and then she will breath hard again. She reports SOB with any exertion. The patients weight is up to 135 today from 128 last week. She is not drinking or eating a lot at all per the dtr report. Patient is scheduled to take the furosemide tomorrow and they were instructed to give the patient 40 mg instead of the 20 mg dose. Explained that if her weight does not improve to give another 40 mg of lasix on Saturday. Follow up scheduled with APP Monday next week.

## 2017-03-07 NOTE — ED Provider Notes (Signed)
[place] EMERGENCY DEPARTMENT Provider Note   CSN: [id number] Arrival date & time: 03/07/17  1809     History   Chief Complaint Chief Complaint  Patient presents with  . Shortness of Breath    HPI Julie Lara is a 83 y.o. female.  HPI Pt presents to the ED with shortness of breath.  Sx started about 1 weeks ago.  In the last few days the sx got worse.  She was actually seen in the ED on 11/28.  She was diagnosed with a CHF exacerbation.  Plan was to adjust her medications and go home.  Pt had been doing well until the last couple of days.  Pt's daugher noted the patient's weight had gone up from 128-135.  She had not been eating or drinking as much.  Patient has been noticing some cough and congestion.  She has been bringing up mucus.  Patient's daughter called the cardiologist office and they are instructed to increase her Lasix dose.  Patient symptoms were worse this evening so EMS was called.  They noted that she was hypoxic.  Patient denies any trouble with chest pain.  No fevers.  She has noticed some leg swelling. Past Medical History:  Diagnosis Date  . Acute blood loss anemia    November 2014  . Aortic insufficiency    mild to moderate  . Arthritis    "right upper arm" (02/01/2017)  . Atrial fibrillation ([place])   . CAD (coronary artery disease)   . Chest pain 04-2010   atypical with negative echocardiogram  . Dilated cardiomyopathy ([place])   . Goiter   . Hypothyroidism   . Idiopathic cardiomyopathy ([place]) 1999   EF 40% by Echo on 10/13/12  . NSTEMI (non-ST elevated myocardial infarction) [facility]) July 2014   95% prox RCA s/p BMS  . PAC (premature atrial contraction)    Chronic    Patient Active Problem List   Diagnosis Date Noted  . Essential hypertension 02/11/2017  . Acute on chronic systolic CHF (congestive heart failure) ([place]) 02/01/2017  . HLD (hyperlipidemia) 02/01/2017  . Atrial fibrillation with RVR ([place])   . Chest pain, rule out acute  myocardial infarction 07/18/2016  . Acute blood loss anemia 03/12/2013  . CAD (coronary artery disease)   . Benign neoplasm of colon 02/23/2013  . Nonspecific abnormal finding in stool contents 02/23/2013  . Near syncope 02/21/2013  . Anemia 02/21/2013  . Acute on chronic renal failure ([place]) 02/21/2013  . Chronic systolic congestive heart failure ([place]) 02/21/2013  . GI bleed 02/21/2013  . Chronic anticoagulation 02/21/2013  . Coronary artery disease with history of myocardial infarction without history of CABG 02/21/2013  . Resting chest pain 10/27/2012  . Hypothyroidism 10/15/2012  . Atrial fibrillation ([place]) 10/15/2012  . CKD (chronic kidney disease), stage III ([place]) 10/15/2012  . UTI (urinary tract infection) 10/15/2012  . Dilated cardiomyopathy ([place])   . Aortic insufficiency   . PAC (premature atrial contraction)   . Chest pain 04/02/2010    Past Surgical History:  Procedure Laterality Date  . biopsy of sacral lesion  2010  . CARDIAC CATHETERIZATION    . COLONOSCOPY N/A 02/23/2013   Procedure: COLONOSCOPY;  Surgeon: Ladene Artist, MD;  Location[facility] END[facility]COPY;  Service: Endoscopy;  Laterality: N/A;  . CORONARY ANGIOPLASTY WITH STENT PLACEMENT  09/2012   BMS-95% prox RCA  . ESOPHAGOGASTRODUODENOSCOPY N/A 02/22/2013   Procedure: ESOPHAGOGASTRODUODENOSCOPY (EGD);  Surgeon: Milus Banister, MD;  Location: [place];  Service: Endoscopy;  Laterality: N/A;  . LEFT HEART CATHETERIZATION WITH CORONARY ANGIOGRAM N/A 10/14/2012   Procedure: LEFT HEART CATHETERIZATION WITH CORONARY ANGIOGRAM;  Surgeon: Wellington Hampshire, MD;  Location: [place] CATH LAB;  Service: Cardiovascular;  Laterality: N/A;  . PERCUTANEOUS CORONARY STENT INTERVENTION (PCI-S) Right 10/14/2012   Procedure: PERCUTANEOUS CORONARY STENT INTERVENTION (PCI-S);  Surgeon: Wellington Hampshire, MD;  Location: [facility] CATH LAB;  Service: Cardiovascular;  Laterality: Right;  . THYROIDECTOMY  2008  . VAGINAL HYSTERECTOMY     Complete     OB History    No data available       Home Medications    Prior to Admission medications   Medication Sig Start Date End Date Taking? Authorizing Provider  aspirin EC 81 MG tablet Take 81 mg by mouth daily.   Yes [provider]  atorvastatin (LIPITOR) 40 MG tablet Take 1 tablet (40 mg total) by mouth daily at 6 PM. 08/28/16  Yes Martinique, Peter M, MD  Cholecalciferol (VITAMIN D PO) Take 1,000 Units by mouth daily.    Yes [provider]  Cyanocobalamin (VITAMIN B-12 PO) Take 1 tablet by mouth daily.   Yes [provider]  furosemide (LASIX) 20 MG tablet Take 1 tablet (20 mg total) every other day by mouth. Start 02/04/2017. 02/05/17 01/31/18 Yes Martinique, Peter M, MD  iron polysaccharides (FERREX 150) 150 MG capsule Take 150 mg by mouth 2 (two) times daily.    Yes [provider]  levothyroxine (SYNTHROID, LEVOTHROID) 25 MCG tablet Take 25 mcg by mouth daily before breakfast.   Yes [provider]  metoprolol tartrate (LOPRESSOR) 25 MG tablet Take 1 tablet (25 mg total) 2 (two) times daily by mouth. 02/05/17  Yes Martinique, Peter M, MD  nitroGLYCERIN (NITROSTAT) 0.4 MG SL tablet Place 1 tablet (0.4 mg total) under the tongue every 5 (five) minutes as needed for chest pain. 08/06/16  Yes Strader, [place] M, PA-C  Nutritional Supplements (FEEDING SUPPLEMENT, NEPRO CARB STEADY,) LIQD Take 237 mLs 2 (two) times daily between meals by mouth. 02/03/17  Yes Hongalgi, Lenis Dickinson, MD    Family History Family History  Problem Relation Age of Onset  . Diabetes Sister     Social History Social History   Tobacco Use  . Smoking status: Never Smoker  . Smokeless tobacco: Never Used  Substance Use Topics  . Alcohol use: No  . Drug use: No     Allergies   Patient has no known allergies.   Review of Systems Review of Systems  All other systems reviewed and are negative.    Physical Exam Updated Vital Signs BP (!) 132/93   Pulse 98   Temp 97.7 F  (36.5 C) (Oral)   Resp (!) 22   SpO2 100%   Physical Exam  Constitutional: She appears well-developed and well-nourished. No distress.  HENT:  Head: Normocephalic and atraumatic.  Right Ear: External ear normal.  Left Ear: External ear normal.  Eyes: Conjunctivae are normal. Right eye exhibits no discharge. Left eye exhibits no discharge. No scleral icterus.  Neck: Neck supple. No tracheal deviation present.  Cardiovascular: Intact distal pulses. An irregularly irregular rhythm present. Tachycardia present.  Pulmonary/Chest: Effort normal. No stridor. No respiratory distress. She has no wheezes. She has rales.  Abdominal: Soft. Bowel sounds are normal. She exhibits no distension. There is no tenderness. There is no rebound and no guarding.  Musculoskeletal: She exhibits no tenderness.       Right lower leg:  She exhibits edema. She exhibits no tenderness.       Left lower leg: She exhibits edema. She exhibits no tenderness.  Neurological: She is alert. She has normal strength. No cranial nerve deficit (no facial droop, extraocular movements intact, no slurred speech) or sensory deficit. She exhibits normal muscle tone. She displays no seizure activity. Coordination normal.  Skin: Skin is warm and dry. No rash noted.  Psychiatric: She has a normal mood and affect.  Nursing note and vitals reviewed.    ED Treatments / Results  Labs (all labs ordered are listed, but only abnormal results are displayed) Labs Reviewed  BASIC METABOLIC PANEL - Abnormal; Notable for the following components:      Result Value   Glucose, Bld 127 (*)    BUN 29 (*)    Creatinine, Ser 1.25 (*)    GFR calc non Af Amer 36 (*)    GFR calc Af Amer 41 (*)    All other components within normal limits  CBC - Abnormal; Notable for the following components:   RBC 3.60 (*)    Hemoglobin 10.7 (*)    HCT 33.3 (*)    All other components within normal limits  BRAIN NATRIURETIC PEPTIDE - Abnormal; Notable for the  following components:   B Natriuretic Peptide 1,582.9 (*)    All other components within normal limits  I-STAT TROPONIN, ED    EKG  EKG Interpretation  Date/Time:  Thursday March 07 2017 18:35:41 EST Ventricular Rate:  105 PR Interval:    QRS Duration: 107 QT Interval:  390 QTC Calculation: 516 R Axis:   -34 Text Interpretation:  Atrial fibrillation Left axis deviation Borderline repolarization abnormality Prolonged QT interval No significant change since last tracing Confirmed by Dorie Rank [phone]) on 03/07/2017 6:37:55 PM       Radiology Dg Chest 2 View  Result Date: 03/07/2017 CLINICAL DATA:  Shortness of breath and weakness for 1 week EXAM: CHEST  2 VIEW COMPARISON:  02/27/2017 FINDINGS: Cardiac shadow is enlarged. Lungs are well aerated bilaterally. Small right-sided pleural effusion is noted. No focal infiltrate is noted. No acute bony abnormality is seen. IMPRESSION: Small right pleural effusion.  No focal infiltrate is seen. Electronically Signed   By: Inez Catalina M.D.   On: 03/07/2017 20:14    Procedures Procedures (including critical care time)  Medications Ordered in ED Medications  furosemide (LASIX) injection 40 mg (not administered)  nitroGLYCERIN (NITROGLYN) 2 % ointment 0.5 inch (not administered)     Initial Impression / Assessment and Plan / ED Course  I have reviewed the triage vital signs and the nursing notes.  Pertinent labs & imaging results that were available during my care of the patient were reviewed by me and considered in my medical decision making (see chart for details).   Patient presented to the emergency room for evaluation of worsening shortness of breath.  She has a history of congestive heart failure.  They have been trying to titrate her diuretics as an outpatient.  Patient presented because of worsening shortness of breath and hypoxia.  Her laboratory tests are suggestive of acute congestive heart failure exacerbation.  Chest x-ray  does show a small pleural effusion but no evidence of infiltrates.  I have ordered use of diuretics.  I will consult with the medical service for admission and further treatment.  Final Clinical Impressions(s) / ED Diagnoses   Final diagnoses:  Acute congestive heart failure, unspecified heart failure type ([place])  ED Discharge Orders    None       Dorie Rank, MD 03/07/17 2050

## 2017-03-07 NOTE — Telephone Encounter (Signed)
New message   Patient daughter calling with concerns about shortness of breath. Please call  Pt c/o Shortness Of Breath: STAT if SOB developed within the last 24 hours or pt is noticeably SOB on the phone  1. Are you currently SOB (can you hear that pt is SOB on the phone)? n/a  2. How long have you been experiencing SOB? 1 week, getting worse  3. Are you SOB when sitting or when up moving around?  Sitting and moving  4. Are you currently experiencing any other symptoms? Weakness, swelling both legs.

## 2017-03-07 NOTE — ED Notes (Signed)
Pt transported to Xray. 

## 2017-03-08 ENCOUNTER — Other Ambulatory Visit: Payer: Self-pay

## 2017-03-08 ENCOUNTER — Other Ambulatory Visit: Payer: Self-pay | Admitting: *Deleted

## 2017-03-08 ENCOUNTER — Inpatient Hospital Stay (HOSPITAL_COMMUNITY): Payer: Medicare Other

## 2017-03-08 LAB — BASIC METABOLIC PANEL
Anion gap: 7 (ref 5–15)
BUN: 28 mg/dL — ABNORMAL HIGH (ref 6–20)
CHLORIDE: 103 mmol/L (ref 101–111)
CO2: 28 mmol/L (ref 22–32)
Calcium: 9.2 mg/dL (ref 8.9–10.3)
Creatinine, Ser: 1.28 mg/dL — ABNORMAL HIGH (ref 0.44–1.00)
GFR calc Af Amer: 40 mL/min — ABNORMAL LOW (ref >60)
GFR calc non Af Amer: 35 mL/min — ABNORMAL LOW (ref >60)
Glucose, Bld: 112 mg/dL — ABNORMAL HIGH (ref 65–99)
POTASSIUM: 4.2 mmol/L (ref 3.5–5.1)
SODIUM: 138 mmol/L (ref 135–145)

## 2017-03-08 LAB — MAGNESIUM: MAGNESIUM: 2.2 mg/dL (ref 1.7–2.4)

## 2017-03-08 NOTE — Care Management Note (Signed)
Case Management Note  Patient Details  Name: Julie Lara MRN: [id number] Date of Birth: 30-May-1920  Subjective/Objective:  CHF                 Action/Plan: 03/08/2017- CM following for progression of care;awaiting for Physical Therapy eval for disposition needs; B Pennie Rushing  02/02/2017 - Subjective/Objective:                 Patient coming from home. Independent for most of ADLs. Admitted with CHF exacerbation. CM will place [facility] CM consult to potentially follow for CHF as outpatient. BTD:HRCBULA, Delfino Lovett, MD Cardiologist:Dr. Martin[place] B Jovoni Borkenhagen RN,MHA,BSN   Expected Discharge Date:   possibly 03/11/2017               Expected Discharge Plan:  [place]  In-House Referral:   [facility]  Discharge planning Services  CM Consult  Status of Service:  In process, will continue to follow  Sherrilyn Rist [phone] 03/08/2017, 1:54 PM

## 2017-03-08 NOTE — Plan of Care (Signed)
Activity intolerance

## 2017-03-08 NOTE — Patient Outreach (Signed)
[facility]) Care Management  03/08/2017  CORDIA MIKLOS [birth year] [id number]   Member scheduled for acute home visit this am, however voice mail received from daughter stating she was in the ED and would be admitted to the hospital.  Home visit canceled, hospital liaisons notified of admission.  Will follow up next week.  Valente David, MSN, RN [facility] Care Management  [facility] Manager [phone]

## 2017-03-08 NOTE — Progress Notes (Signed)
PROGRESS NOTE    BEDELIA PONG  ASN:[id number] DOB: 1920-08-01 DOA: 03/07/2017 PCP: Burnard Bunting, MD   Brief Narrative: Julie Lara is a 83 y.o. female with medical history significant for coronary artery disease, chronic systolic CHF, atrial fibrillation not anticoagulated, and hypothyroidism, now presenting to the emergency department for evaluation of aggressive shortness of breath, lower extremity swelling, and weight gain over the past week.  Patient reportedly had her Lasix doubled recently by her cardiologist, but has continued to experience progressive dyspnea over the past week with increased bilateral lower extremity edema and a 7 pound weight gain.  With her symptoms continuing to worsen, and the patient in apparent distress, family called 911.  EMS found the patient to be hypoxic, placed on supplemental oxygen, and transported her to the hospital.  She denies any chest pain or significant cough.  Denies fevers or chills.  ED Course: Upon arrival to the ED, patient is found to be afebrile, requiring 4 L/min of supplemental oxygen in order to maintain adequate saturations, tachycardic in the low 100s, and modestly hypertensive.  EKG features atrial fibrillation with rate 105 and QTc interval of 516 ms.  Chest x-ray is notable for a small right pleural effusion.  Chemistry panel reveals a creatinine 1.25, consistent with her apparent baseline.  CBC is notable for a stable chronic normocytic anemia with hemoglobin of 10.7.  Troponin is within normal limits and BNP is elevated at 1583.  Patient was treated with 40 mg IV Lasix and glycerin ointment in the ED.  Heart rate remained between 100 and 120, blood pressure remained stable, patient is not in any acute distress, and she will be admitted to the telemetry unit for ongoing evaluation and management of acute on chronic systolic CHF with hypoxia.     Assessment & Plan:   Principal Problem:   Acute on chronic systolic CHF  (congestive heart failure) (HCC) Active Problems:   Hypothyroidism   Atrial fibrillation (HCC)   CKD (chronic kidney disease), stage III (HCC)   Coronary artery disease with history of myocardial infarction without history of CABG   Essential hypertension  1-Acute Hypoxic Respiratory failure. Presents hypoxic, chest x ray with pulmonary edema, increase BNP. Likely related to Heart failure Exacerbation.  Improved with diuresis.  Continue with IV lasix.   Acute on chronic systolic HF exacerbation;  - TTE (02/02/17) with EF 35-40%, diffuse HK, mild TR, moderate AI and MR, severe LAE  -increase BNP, Chest x ray with pulmonary edema.  Continue with lasix BID.  Daily weigh, strict I and o.   3-CKD stage III. Cr baseline 1.2.  Monitor on lasix.   4-CAD; Continue with aspirin, statin, BB.   5-HTN; on lopressor.   6-Dysphagia, shocking episodes.  Change diet to clear.  Speech swallow evaluation.  Check esophagogram.    DVT prophylaxis: Heparin.  Code Status: full code.  Family Communication: care discussed with son.  Disposition Plan: remain inpatient.    Consultants:   none   Procedures: none  Antimicrobials: none  Subjective: She is feeling better, breathing better, not at baseline.  She report 2 episodes of shocking on food and medications.    Objective: Vitals:   03/08/17 1045 03/08/17 1130 03/08/17 1145 03/08/17 1200  BP: 124/72 102/68 (!) 128/29 124/90  Pulse: 92 84 80 78  Resp: (!) 22 (!) 26 16 (!) 25  Temp:      TempSrc:      SpO2: 95% 98% 98% 99%   No  intake or output data in the 24 hours ending 03/08/17 1210 There were no vitals filed for this visit.  Examination:  General exam: alert  Respiratory system: mild tachypnea, bilateral crackles.  Cardiovascular system: S1 & S2 heard, RRR. Positive  JVD, systolic murmurs, no rubs, gallops or clicks. Trace edema, Gastrointestinal system: Abdomen is distended, soft and nontender. No organomegaly or masses  felt. Normal bowel sounds heard. Central nervous system: Alert and oriented. Extremities: Symmetric 5 x 5 power. Skin: No rashes, lesions or ulcers    Data Reviewed: I have personally reviewed following labs and imaging studies  CBC: Recent Labs  Lab 03/07/17 1906  WBC 5.2  HGB 10.7*  HCT 33.3*  MCV 92.5  PLT 161   Basic Metabolic Panel: Recent Labs  Lab 03/07/17 1906 03/08/17 0310  NA 137 138  K 4.6 4.2  CL 105 103  CO2 24 28  GLUCOSE 127* 112*  BUN 29* 28*  CREATININE 1.25* 1.28*  CALCIUM 9.3 9.2  MG  --  2.2   GFR: Estimated Creatinine Clearance: 22.3 mL/min (A) (by C-G formula based on SCr of 1.28 mg/dL (H)). Liver Function Tests: No results for input(s): AST, ALT, ALKPHOS, BILITOT, PROT, ALBUMIN in the last 168 hours. No results for input(s): LIPASE, AMYLASE in the last 168 hours. No results for input(s): AMMONIA in the last 168 hours. Coagulation Profile: No results for input(s): INR, PROTIME in the last 168 hours. Cardiac Enzymes: No results for input(s): CKTOTAL, CKMB, CKMBINDEX, TROPONINI in the last 168 hours. BNP (last 3 results) No results for input(s): PROBNP in the last 8760 hours. HbA1C: No results for input(s): HGBA1C in the last 72 hours. CBG: No results for input(s): GLUCAP in the last 168 hours. Lipid Profile: No results for input(s): CHOL, HDL, LDLCALC, TRIG, CHOLHDL, LDLDIRECT in the last 72 hours. Thyroid Function Tests: No results for input(s): TSH, T4TOTAL, FREET4, T3FREE, THYROIDAB in the last 72 hours. Anemia Panel: No results for input(s): VITAMINB12, FOLATE, FERRITIN, TIBC, IRON, RETICCTPCT in the last 72 hours. Sepsis Labs: No results for input(s): PROCALCITON, LATICACIDVEN in the last 168 hours.  No results found for this or any previous visit (from the past 240 hour(s)).       Radiology Studies: Dg Chest 2 View  Result Date: 03/07/2017 CLINICAL DATA:  Shortness of breath and weakness for 1 week EXAM: CHEST  2 VIEW  COMPARISON:  02/27/2017 FINDINGS: Cardiac shadow is enlarged. Lungs are well aerated bilaterally. Small right-sided pleural effusion is noted. No focal infiltrate is noted. No acute bony abnormality is seen. IMPRESSION: Small right pleural effusion.  No focal infiltrate is seen. Electronically Signed   By: Inez Catalina M.D.   On: 03/07/2017 20:14        Scheduled Meds: . aspirin EC  81 mg Oral Daily  . atorvastatin  40 mg Oral q1800  . feeding supplement (NEPRO CARB STEADY)  237 mL Oral BID BM  . furosemide  40 mg Intravenous Q12H  . heparin  5,000 Units Subcutaneous Q8H  . levothyroxine  25 mcg Oral QAC breakfast  . metoprolol tartrate  37.5 mg Oral BID  . nitroGLYCERIN  0.5 inch Topical Q6H  . sodium chloride flush  3 mL Intravenous Q12H   Continuous Infusions: . sodium chloride       LOS: 1 day    Time spent: 35 minutes.     Elmarie Shiley, MD [facility] Pager [phone] If 7PM-7AM, please contact night-coverage [email or web address] Password [email or web address] 03/08/2017, 12:10  PM  

## 2017-03-08 NOTE — Progress Notes (Signed)
Patient admitted to room 317 from holding area. Family at bedside. Vital signs stable, denies pain at this time. A-fib on tele monitor. Will continue to monitor closely.

## 2017-03-08 NOTE — Evaluation (Signed)
Clinical/Bedside Swallow Evaluation Patient Details  Name: Julie Lara MRN: [id number] Date of Birth: 12/15/1920  Today's Date: 03/08/2017 Time: SLP Start Time (ACUTE ONLY): 1430 SLP Stop Time (ACUTE ONLY): 1443 SLP Time Calculation (min) (ACUTE ONLY): 13 min  Past Medical History:  Past Medical History:  Diagnosis Date  . Acute blood loss anemia    November 2014  . Aortic insufficiency    mild to moderate  . Arthritis    "right upper arm" (02/01/2017)  . Atrial fibrillation ([place])   . CAD (coronary artery disease)   . Chest pain 04-2010   atypical with negative echocardiogram  . Dilated cardiomyopathy ([place])   . Goiter   . Hypothyroidism   . Idiopathic cardiomyopathy ([place]) 1999   EF 40% by Echo on 10/13/12  . NSTEMI (non-ST elevated myocardial infarction) [facility]) July 2014   95% prox RCA s/p BMS  . PAC (premature atrial contraction)    Chronic   Past Surgical History:  Past Surgical History:  Procedure Laterality Date  . biopsy of sacral lesion  2010  . CARDIAC CATHETERIZATION    . COLONOSCOPY N/A 02/23/2013   Procedure: COLONOSCOPY;  Surgeon: Ladene Artist, MD;  Location: [facility] ENDOSCOPY;  Service: Endoscopy;  Laterality: N/A;  . CORONARY ANGIOPLASTY WITH STENT PLACEMENT  09/2012   BMS-95% prox RCA  . ESOPHAGOGASTRODUODENOSCOPY N/A 02/22/2013   Procedure: ESOPHAGOGASTRODUODENOSCOPY (EGD);  Surgeon: Milus Banister, MD;  Location: [place];  Service: Endoscopy;  Laterality: N/A;  . LEFT HEART CATHETERIZATION WITH CORONARY ANGIOGRAM N/A 10/14/2012   Procedure: LEFT HEART CATHETERIZATION WITH CORONARY ANGIOGRAM;  Surgeon: Wellington Hampshire, MD;  Location: [place] CATH LAB;  Service: Cardiovascular;  Laterality: N/A;  . PERCUTANEOUS CORONARY STENT INTERVENTION (PCI-S) Right 10/14/2012   Procedure: PERCUTANEOUS CORONARY STENT INTERVENTION (PCI-S);  Surgeon: Wellington Hampshire, MD;  Location: [facility] CATH LAB;  Service: Cardiovascular;  Laterality: Right;  . THYROIDECTOMY  2008  . VAGINAL  HYSTERECTOMY     Complete   HPI:  83 y.o.femalewith medical history significant forcoronary artery disease, chronic systolic CHF, atrial fibrillation not anticoagulated, and hypothyroidism, now presenting to the emergency department for evaluation of aggressive shortness of breath, lower extremity swelling, and weight gain over the past week. Admitted with acute on chronic systolic CHF with hypoxia.  Hx dysphagia per MD notes; now on clear liquids. 12/06 CXR: Small right pleural effusion. No focal infiltrate is seen.   Assessment / Plan / Recommendation Clinical Impression  Pt presents with normal swallow function with adequate mastication, brisk swallow response, no overt s/s of aspiration.  No focal deficits; pt does have bruising/discoloration bilaterally on anterior tongue. Pt describes two incidents of regurgitation lately.  Results of esophagram pending.  Recommend crushing meds; advance diet to soft solids, thin liquids if esophagram is normal.  No SLP needs identified - our services will sign off.    SLP Visit Diagnosis: Dysphagia, unspecified (R13.10)    Aspiration Risk  No limitations    Diet Recommendation   clear liquids pending results of esophagram - advance per MD  Medication Administration: Crushed with puree    Other  Recommendations Oral Care Recommendations: Oral care BID   Follow up Recommendations None      Frequency and Duration            Prognosis        Swallow Study   General HPI: 83 y.o.femalewith medical history significant forcoronary artery disease, chronic systolic CHF, atrial fibrillation not anticoagulated, and hypothyroidism, now presenting  to the emergency department for evaluation of aggressive shortness of breath, lower extremity swelling, and weight gain over the past week. Admitted with acute on chronic systolic CHF with hypoxia.  Hx dysphagia per MD notes; now on clear liquids. 12/06 CXR: Small right pleural effusion. No focal  infiltrate is seen. Type of Study: Bedside Swallow Evaluation Previous Swallow Assessment: no Diet Prior to this Study: Thin liquids Temperature Spikes Noted: No Respiratory Status: Nasal cannula History of Recent Intubation: No Behavior/Cognition: Alert;Cooperative Oral Cavity Assessment: Within Functional Limits;Other (comment)(bilateral discoloration anterior/lateral tongue - bruising?) Oral Care Completed by SLP: No Oral Cavity - Dentition: Dentures, top;Dentures, bottom Vision: Functional for self-feeding Self-Feeding Abilities: Able to feed self Patient Positioning: Upright in bed Baseline Vocal Quality: Hoarse Volitional Cough: Strong Volitional Swallow: Able to elicit    Oral/Motor/Sensory Function Overall Oral Motor/Sensory Function: Within functional limits   Ice Chips Ice chips: Within functional limits   Thin Liquid Thin Liquid: Within functional limits    Nectar Thick Nectar Thick Liquid: Not tested   Honey Thick Honey Thick Liquid: Not tested   Puree Puree: Within functional limits   Solid   GO   Solid: Not tested        Julie Lara 03/08/2017,2:47 PM

## 2017-03-08 NOTE — ED Notes (Signed)
Heart Healthy diet breakfast tray ordered @ 0445.  

## 2017-03-08 NOTE — ED Notes (Signed)
Pt is alert and oriented, placed on monitor, afib 97 on monitor.  Purewick not hooked to suction, makes patient uncomfortable.  Patient deciding if she still wants it or not. Family at bedside

## 2017-03-08 NOTE — Consult Note (Signed)
   [facility] CM Inpatient Consult   03/08/2017  Julie Lara [birth year] [id number]   Received notice of the patient's hospitalization by the [place] Coordinator.  Patient is active with [facility].  Will follow for progress and needs.  Please contact for questions or needs:  Natividad Brood, RN BSN [facility] Liaison  [phone] business mobile phone Toll free office [phone]

## 2017-03-08 NOTE — ED Notes (Signed)
Patient placed on bedpan.

## 2017-03-08 NOTE — Evaluation (Signed)
Physical Therapy Evaluation Patient Details Name: Julie Lara MRN: [id number] DOB: 12-02-1920 Today's Date: 03/08/2017   History of Present Illness  Pt is a 83 y/o female admitted secondary to CHF exacerbation. Imaging revealed pulmonary edema. PMH includes CHF, CKD, CAD, a fib, MI, and HTN.   Clinical Impression  Pt is admitted secondary to problem above with deficits below. PTA, pt was independent with functional mobility. Upon eval, pt presenting with weakness and decreased balance. Required min to min guard assist for balance with RW, and required min A for steadying for stand pivot to [facility] without AD. Educated about use of RW at home to increase stability. Recommending HHPT at d/c to address mobility deficits. Per pt and grandaughter, pt's daughter will be able to assist as needed at d/c. Will need DME below. Will continue to follow acutely to maximize functional mobility independence and safety.     Follow Up Recommendations Home health PT;Supervision/Assistance - 24 hour    Equipment Recommendations  Rolling walker with 5" wheels;3in1 (PT)    Recommendations for Other Services OT consult     Precautions / Restrictions Precautions Precautions: Fall Precaution Comments: Pt reports fall out of car where she hit her head.  Restrictions Weight Bearing Restrictions: No      Mobility  Bed Mobility Overal bed mobility: Needs Assistance Bed Mobility: Supine to Sit;Sit to Supine     Supine to sit: Supervision Sit to supine: Supervision   General bed mobility comments: Supervision for safety. Required safety cues to wait for PT before performing mobility. Required use of bed rails and elevated HOB.   Transfers Overall transfer level: Needs assistance Equipment used: None;Rolling walker (2 wheeled) Transfers: Sit to/from [facility] Sit to Stand: Min assist Stand pivot transfers: Min assist       General transfer comment: Min A for steadying assist for  standing without AD. Pt in a hurry to get to [facility] and required min A for steadying during transfer to [facility]. Increased steadiness with use of RW.   Ambulation/Gait Ambulation/Gait assistance: Min guard;Min assist Ambulation Distance (Feet): 125 Feet Assistive device: Rolling walker (2 wheeled) Gait Pattern/deviations: Step-through pattern;Decreased stride length;Drifts right/left Gait velocity: Decreased Gait velocity interpretation: Below normal speed for age/gender General Gait Details: Slow, slightly unsteady gait with use of RW. Required safety cues throughout for proximity to device and for safe use of RW. LOB X 1 requiring min A for stability. Educated to use RW at home to increase stability.   Stairs            Wheelchair Mobility    Modified Rankin (Stroke Patients Only)       Balance Overall balance assessment: Needs assistance Sitting-balance support: No upper extremity supported;Feet supported Sitting balance-Leahy Scale: Good     Standing balance support: Bilateral upper extremity supported;During functional activity Standing balance-Leahy Scale: Poor Standing balance comment: Reliant on BUE support                              Pertinent Vitals/Pain Pain Assessment: No/denies pain    Home Living Family/patient expects to be discharged to:: Private residence Living Arrangements: Children Available Help at Discharge: Family;Available 24 hours/day Type of Home: House Home Access: Stairs to enter Entrance Stairs-Rails: Right Entrance Stairs-Number of Steps: 1 Home Layout: One level Home Equipment: Cane - single point      Prior Function Level of Independence: Independent with assistive device(s)  Comments: Using cane for ambulation      Hand Dominance   Dominant Hand: Right    Extremity/Trunk Assessment   Upper Extremity Assessment Upper Extremity Assessment: Defer to OT evaluation    Lower Extremity Assessment Lower  Extremity Assessment: Generalized weakness    Cervical / Trunk Assessment Cervical / Trunk Assessment: Kyphotic  Communication   Communication: No difficulties  Cognition Arousal/Alertness: Awake/alert Behavior During Therapy: WFL for tasks assessed/performed Overall Cognitive Status: Within Functional Limits for tasks assessed                                        General Comments General comments (skin integrity, edema, etc.): Pt's grandaughter present during session. Educated about HHPT recommendations and pt agreeable.     Exercises     Assessment/Plan    PT Assessment Patient needs continued PT services  PT Problem List Decreased strength;Decreased balance;Decreased mobility;Decreased knowledge of use of DME;Decreased knowledge of precautions;Cardiopulmonary status limiting activity       PT Treatment Interventions DME instruction;Gait training;Stair training;Functional mobility training;Therapeutic activities;Therapeutic exercise;Balance training;Neuromuscular re-education;Patient/family education    PT Goals (Current goals can be found in the Care Plan section)  Acute Rehab PT Goals Patient Stated Goal: to go home  PT Goal Formulation: With patient Time For Goal Achievement: 03/22/17 Potential to Achieve Goals: Good    Frequency Min 3X/week   Barriers to discharge        Co-evaluation               AM-PAC PT "6 Clicks" Daily Activity  Outcome Measure Difficulty turning over in bed (including adjusting bedclothes, sheets and blankets)?: A Little Difficulty moving from lying on back to sitting on the side of the bed? : A Little Difficulty sitting down on and standing up from a chair with arms (e.g., wheelchair, bedside commode, etc,.)?: Unable Help needed moving to and from a bed to chair (including a wheelchair)?: A Little Help needed walking in hospital room?: A Little Help needed climbing 3-5 steps with a railing? : A Lot 6 Click  Score: 15    End of Session Equipment Utilized During Treatment: Gait belt;Oxygen Activity Tolerance: Patient tolerated treatment well Patient left: in bed;with call bell/phone within reach Nurse Communication: Mobility status PT Visit Diagnosis: Unsteadiness on feet (R26.81);Muscle weakness (generalized) (M62.81);History of falling (Z91.81)    Time: 3149-7026 PT Time Calculation (min) (ACUTE ONLY): 35 min   Charges:   PT Evaluation $PT Eval Low Complexity: 1 Low PT Treatments $Gait Training: 8-22 mins   PT G Codes:        Leighton Ruff, PT, DPT  Acute Rehabilitation Services  Pager: [phone]   Rudean Hitt 03/08/2017, 4:29 PM

## 2017-03-09 DIAGNOSIS — R1310 Dysphagia, unspecified: Secondary | ICD-10-CM

## 2017-03-09 LAB — CBC
HEMATOCRIT: 34.1 % — AB (ref 36.0–46.0)
Hemoglobin: 10.7 g/dL — ABNORMAL LOW (ref 12.0–15.0)
MCH: 29.3 pg (ref 26.0–34.0)
MCHC: 31.4 g/dL (ref 30.0–36.0)
MCV: 93.4 fL (ref 78.0–100.0)
Platelets: 154 10*3/uL (ref 150–400)
RBC: 3.65 MIL/uL — AB (ref 3.87–5.11)
RDW: 15.3 % (ref 11.5–15.5)
WBC: 5.6 10*3/uL (ref 4.0–10.5)

## 2017-03-09 LAB — BASIC METABOLIC PANEL
Anion gap: 11 (ref 5–15)
BUN: 27 mg/dL — AB (ref 6–20)
CHLORIDE: 97 mmol/L — AB (ref 101–111)
CO2: 30 mmol/L (ref 22–32)
CREATININE: 1.35 mg/dL — AB (ref 0.44–1.00)
Calcium: 8.6 mg/dL — ABNORMAL LOW (ref 8.9–10.3)
GFR calc Af Amer: 38 mL/min — ABNORMAL LOW (ref >60)
GFR calc non Af Amer: 32 mL/min — ABNORMAL LOW (ref >60)
Glucose, Bld: 85 mg/dL (ref 65–99)
POTASSIUM: 3.5 mmol/L (ref 3.5–5.1)
SODIUM: 138 mmol/L (ref 135–145)

## 2017-03-09 MED ORDER — PANTOPRAZOLE SODIUM 40 MG PO TBEC: 40.0000 mg | DELAYED_RELEASE_TABLET | Freq: Every day | ORAL | 0 refills | Status: DC

## 2017-03-09 MED ORDER — FUROSEMIDE 40 MG PO TABS: 40.0000 mg | ORAL_TABLET | Freq: Every day | ORAL | 0 refills | Status: DC

## 2017-03-09 MED ORDER — FUROSEMIDE 40 MG PO TABS
40.0000 mg | ORAL_TABLET | Freq: Every day | ORAL | Status: DC
  Administered 2017-03-09: 40 mg via ORAL
  Filled 2017-03-09: qty 1

## 2017-03-09 MED ORDER — FUROSEMIDE 10 MG/ML IJ SOLN
40.0000 mg | Freq: Every day | INTRAMUSCULAR | Status: DC
Start: 2017-03-09 — End: 2017-03-09

## 2017-03-09 MED ORDER — PANTOPRAZOLE SODIUM 40 MG PO TBEC
40.0000 mg | DELAYED_RELEASE_TABLET | Freq: Every day | ORAL | Status: DC
  Administered 2017-03-09: 40 mg via ORAL
  Filled 2017-03-09: qty 1

## 2017-03-09 MED ORDER — PANTOPRAZOLE SODIUM 40 MG IV SOLR: 40.0000 mg | Freq: Two times a day (BID) | INTRAVENOUS | Status: DC

## 2017-03-09 NOTE — Consult Note (Signed)
Referring Provider:  Dr. Tyrell Antonio Primary Care Physician:  Burnard Bunting, MD Primary Gastroenterologist:  Dr. Ardis Hughs  Reason for Consultation:  Dysphagia, esophageal stricture on esophagram  HPI: Julie Lara is a 83 y.o. female with medical history significant forcoronary artery disease, chronic systolic CHF, atrial fibrillation not anticoagulated, and hypothyroidism, who presented to the emergency department for evaluation of aggressive shortness of breath, lower extremity swelling, and weight gain over the past week. Patient reportedly had her Lasix doubled recently by her cardiologist, but has continued to experience progressive dyspnea over the past week with increased bilateral lower extremity edema and a 7 pound weight gain. With her symptoms continuing to worsen, and the patient in apparent distress, family called 911. EMS found the patient to be hypoxic, placed on supplemental oxygen, and transported her to the hospital.  Patient was admitted for acute hypoxic respiratory failure and acute on chronic systolic CHF exacerbation.  Patient reported issues with swallowing.  Passed speech evaluation.  Esophagram was performed and showed the following:   IMPRESSION: Suspect a moderate, likely reflux induced stricture involving the gastroesophageal junction. Delayed passage of a 13 mm barium tablet at this level, likely secondary.  Patient tells me that prior to a few days ago she was not having any issues swallowing food.  Sometimes a pill would get hung up if she took too many at a time or was "in a hurry".  The issues with swallowing just started over the past few days when she was having a flare of her cardiac/respiratory issues.    EGD by Dr. Ardis Hughs in 01/2013 was normal.  Past Medical History:  Diagnosis Date  . Acute blood loss anemia    November 2014  . Aortic insufficiency    mild to moderate  . Arthritis    "right upper arm" (02/01/2017)  . Atrial fibrillation ([place])    . CAD (coronary artery disease)   . Chest pain 04-2010   atypical with negative echocardiogram  . Dilated cardiomyopathy ([place])   . Goiter   . Hypothyroidism   . Idiopathic cardiomyopathy ([place]) 1999   EF 40% by Echo on 10/13/12  . NSTEMI (non-ST elevated myocardial infarction) [facility]) July 2014   95% prox RCA s/p BMS  . PAC (premature atrial contraction)    Chronic    Past Surgical History:  Procedure Laterality Date  . biopsy of sacral lesion  2010  . CARDIAC CATHETERIZATION    . COLONOSCOPY N/A 02/23/2013   Procedure: COLONOSCOPY;  Surgeon: Ladene Artist, MD;  Location: [facility] ENDOSCOPY;  Service: Endoscopy;  Laterality: N/A;  . CORONARY ANGIOPLASTY WITH STENT PLACEMENT  09/2012   BMS-95% prox RCA  . ESOPHAGOGASTRODUODENOSCOPY N/A 02/22/2013   Procedure: ESOPHAGOGASTRODUODENOSCOPY (EGD);  Surgeon: Milus Banister, MD;  Location: [place];  Service: Endoscopy;  Laterality: N/A;  . LEFT HEART CATHETERIZATION WITH CORONARY ANGIOGRAM N/A 10/14/2012   Procedure: LEFT HEART CATHETERIZATION WITH CORONARY ANGIOGRAM;  Surgeon: Wellington Hampshire, MD;  Location: [place] CATH LAB;  Service: Cardiovascular;  Laterality: N/A;  . PERCUTANEOUS CORONARY STENT INTERVENTION (PCI-S) Right 10/14/2012   Procedure: PERCUTANEOUS CORONARY STENT INTERVENTION (PCI-S);  Surgeon: Wellington Hampshire, MD;  Location: [facility] CATH LAB;  Service: Cardiovascular;  Laterality: Right;  . THYROIDECTOMY  2008  . VAGINAL HYSTERECTOMY     Complete    Prior to Admission medications   Medication Sig Start Date End Date Taking? Authorizing Provider  aspirin EC 81 MG tablet Take 81 mg by mouth daily.   Yes  [provider]  atorvastatin (LIPITOR) 40 MG tablet Take 1 tablet (40 mg total) by mouth daily at 6 PM. 08/28/16  Yes Martinique, Peter M, MD  Cholecalciferol (VITAMIN D PO) Take 1,000 Units by mouth daily.    Yes [provider]  Cyanocobalamin (VITAMIN B-12 PO) Take 1 tablet by mouth daily.   Yes [provider]  furosemide (LASIX) 20 MG tablet Take 1 tablet (20 mg total) every other day by mouth. Start 02/04/2017. 02/05/17 01/31/18 Yes Martinique, Peter M, MD  iron polysaccharides (FERREX 150) 150 MG capsule Take 150 mg by mouth 2 (two) times daily.    Yes [provider]  levothyroxine (SYNTHROID, LEVOTHROID) 25 MCG tablet Take 25 mcg by mouth daily before breakfast.   Yes [provider]  metoprolol tartrate (LOPRESSOR) 25 MG tablet Take 1 tablet (25 mg total) 2 (two) times daily by mouth. 02/05/17  Yes Martinique, Peter M, MD  nitroGLYCERIN (NITROSTAT) 0.4 MG SL tablet Place 1 tablet (0.4 mg total) under the tongue every 5 (five) minutes as needed for chest pain. 08/06/16  Yes Strader, [place] M, PA-C  Nutritional Supplements (FEEDING SUPPLEMENT, NEPRO CARB STEADY,) LIQD Take 237 mLs 2 (two) times daily between meals by mouth. 02/03/17  Yes Hongalgi, Lenis Dickinson, MD    Current Facility-Administered Medications  Medication Dose Route Frequency Provider Last Rate Last Dose  . 0.9 %  sodium chloride infusion  250 mL Intravenous PRN Opyd, Ilene Qua, MD      . acetaminophen (TYLENOL) tablet 650 mg  650 mg Oral Q4H PRN Opyd, Ilene Qua, MD      . aspirin EC tablet 81 mg  81 mg Oral Daily Opyd, Ilene Qua, MD   81 mg at 03/08/17 0941  . atorvastatin (LIPITOR) tablet 40 mg  40 mg Oral q1800 Opyd, Ilene Qua, MD   40 mg at 03/08/17 1834  . feeding supplement (NEPRO CARB STEADY) liquid 237 mL  237 mL Oral BID BM Opyd, Ilene Qua, MD      . furosemide (LASIX) injection 40 mg  40 mg Intravenous Daily Regalado, Belkys A, MD      . heparin injection 5,000 Units  5,000 Units Subcutaneous Q8H Opyd, Ilene Qua, MD   5,000 Units at 03/09/17 0544  . levothyroxine (SYNTHROID, LEVOTHROID) tablet 25 mcg  25 mcg Oral QAC breakfast Opyd, Ilene Qua, MD   25 mcg at 03/08/17 1213  . metoprolol tartrate (LOPRESSOR) tablet 37.5 mg  37.5 mg Oral BID Opyd, Ilene Qua, MD   37.5 mg at 03/08/17 2029  . nitroGLYCERIN (NITROGLYN) 2 %  ointment 0.5 inch  0.5 inch Topical Q6H Dorie Rank, MD   0.5 inch at 03/09/17 0544  . ondansetron (ZOFRAN) injection 4 mg  4 mg Intravenous Q6H PRN Opyd, Ilene Qua, MD      . pantoprazole (PROTONIX) injection 40 mg  40 mg Intravenous Q12H Regalado, Belkys A, MD      . sodium chloride flush (NS) 0.9 % injection 3 mL  3 mL Intravenous Q12H Opyd, Ilene Qua, MD   3 mL at 03/08/17 2029  . sodium chloride flush (NS) 0.9 % injection 3 mL  3 mL Intravenous PRN Opyd, Ilene Qua, MD        Allergies as of 03/07/2017  . (No Known Allergies)    Family History  Problem Relation Age of Onset  . Diabetes Sister     Social History   Socioeconomic History  . Marital status: Widowed  Spouse name: Not on file  . Number of children: Not on file  . Years of education: Not on file  . Highest education level: Not on file  Social Needs  . Financial resource strain: Not on file  . Food insecurity - worry: Not on file  . Food insecurity - inability: Not on file  . Transportation needs - medical: Not on file  . Transportation needs - non-medical: Not on file  Occupational History  . Occupation: Retired    Fish farm manager: [employer]  Tobacco Use  . Smoking status: Never Smoker  . Smokeless tobacco: Never Used  Substance and Sexual Activity  . Alcohol use: No  . Drug use: No  . Sexual activity: Not Currently  Other Topics Concern  . Not on file  Social History Narrative   Pt lives alone, but has family close by.    Review of Systems: ROS is O/W negative except as mentioned in HPI.  Physical Exam: Vital signs in last 24 hours: Temp:  [97.4 F (36.3 C)-98.6 F (37 C)] 98.6 F (37 C) (12/08 0455) Pulse Rate:  [77-92] 77 (12/08 0455) Resp:  [16-26] 16 (12/08 0455) BP: (102-128)/(29-90) 118/69 (12/08 0455) SpO2:  [95 %-100 %] 97 % (12/08 0829) Weight:  [127 lb 13.9 oz (58 kg)-131 lb 13.4 oz (59.8 kg)] 127 lb 13.9 oz (58 kg) (12/08 0708) Last BM Date: 03/06/17(Per patient/family) General:   Alert, Well-developed, well-nourished, pleasant and cooperative in NAD Head:  Normocephalic and atraumatic. Eyes:  Sclera clear, no icterus.  Conjunctiva pink. Ears:  Normal auditory acuity. Mouth:  No deformity or lesions.   Lungs:  Mild crackles heard B/L. Heart:  Irregularly irregular. Abdomen:  Soft, non-distended.  BS present.  Non-tender. Rectal:  Deferred  Msk:  Symmetrical without gross deformities. Pulses:  Normal pulses noted. Extremities:  Without clubbing or edema. Neurologic:  Alert and oriented x 4;  grossly normal neurologically. Skin:  Intact without significant lesions or rashes. Psych:  Alert and cooperative. Normal mood and affect.  Intake/Output from previous day: 12/07 0701 - 12/08 0700 In: 120 [P.O.:120] Out: 1200 [Urine:1200] Intake/Output this shift: Total I/O In: 240 [P.O.:240] Out: 400 [Urine:400]  Lab Results: Recent Labs    03/07/17 1906 03/09/17 0403  WBC 5.2 5.6  HGB 10.7* 10.7*  HCT 33.3* 34.1*  PLT 152 154   BMET Recent Labs    03/07/17 1906 03/08/17 0310 03/09/17 0403  NA 137 138 138  K 4.6 4.2 3.5  CL 105 103 97*  CO2 24 28 30   GLUCOSE 127* 112* 85  BUN 29* 28* 27*  CREATININE 1.25* 1.28* 1.35*  CALCIUM 9.3 9.2 8.6*   Studies/Results: Dg Chest 2 View  Result Date: 03/07/2017 CLINICAL DATA:  Shortness of breath and weakness for 1 week EXAM: CHEST  2 VIEW COMPARISON:  02/27/2017 FINDINGS: Cardiac shadow is enlarged. Lungs are well aerated bilaterally. Small right-sided pleural effusion is noted. No focal infiltrate is noted. No acute bony abnormality is seen. IMPRESSION: Small right pleural effusion.  No focal infiltrate is seen. Electronically Signed   By: Inez Catalina M.D.   On: 03/07/2017 20:14   Dg Esophagus  Result Date: 03/08/2017 CLINICAL DATA:  Dysphagia. EXAM: ESOPHOGRAM/BARIUM SWALLOW TECHNIQUE: Single contrast examination was performed using  thin barium. FLUOROSCOPY TIME:  Fluoroscopy Time:  2 minutes and 6 seconds  Radiation Exposure Index (if provided by the fluoroscopic device): Not applicable. Number of Acquired Spot Images: 0 COMPARISON:  Chest radiograph 03/07/2017 FINDINGS: Focused single-contrast exam  performed with patient in an LPO position. Esophageal motility is grossly normal, but suboptimally evaluated secondary to patient age and clinical status/positioning. No upper esophageal stricture identified. There is an area of persistent underdistention at the gastroesophageal junction, best seen on image 122/series 3. Mild spontaneous gastroesophageal reflux identified to the lower esophagus. Delayed passage of a 13 mm barium tablet at the distal esophagus. IMPRESSION: Suspect a moderate, likely reflux induced stricture involving the gastroesophageal junction. Delayed passage of a 13 mm barium tablet at this level, likely secondary. Electronically Signed   By: Abigail Miyamoto M.D.   On: 03/08/2017 14:39    IMPRESSION:  *83 year old female with complaints of dysphagia for the past couple of days in the setting of some respiratory issues.  No problems with swallowing food prior to a few days ago. *Acute hypoxic respiratory failure and acute on chronic systolic CHF exacerbation:  Improved.  PLAN: *Discussed with patient, her family, and Dr. Ardis Hughs.  Due to her advanced age and the fact that she was not having any complaints of dysphagia until a couple of days ago we have all decided to let her try a soft diet and see how she tolerates it before considering an EGD.  If she does well then she can be discharged and we will follow-up with her in the office in about 2 months to be sure she continues without symptoms. *We have changed her PPI to pantoprazole 40 mg daily and she should continue that upon discharge, indefinitely.   Laban Emperor. Zehr  03/09/2017, 10:11 AM  Pager number [phone]   ________________________________________________________________________  Velora Heckler GI MD note:  I personally examined the  patient, reviewed the data and agree with the assessment and plan described above.  Barium esophagram suggests minor narrowing at GE junction.  She's had trouble with pills 2 days ago, associated with a lot of mucous and phlegm.  Radiologist felt the narrowing likely to be from reflux and I agree clinically.  She is safe for d/c today of she tolerates heart healthy diet.  Should be on once daily PPI (shortly before BF meal).  My office will contact her for visit in 5-6 weeks to check on her swallowing at that point.   Owens Loffler, MD [facility] Gastroenterology Pager [phone]

## 2017-03-09 NOTE — Evaluation (Signed)
Occupational Therapy Evaluation Patient Details Name: Julie Lara MRN: [id number] DOB: October 23, 1920 Today's Date: 03/09/2017    History of Present Illness Pt is a 83 y/o female admitted secondary to CHF exacerbation. Imaging revealed pulmonary edema. PMH includes CHF, CKD, CAD, a fib, MI, and HTN.    Clinical Impression   Pt admitted secondary to problem listed above. PTA, pt was independent with functional mobility and ADLs, requiring some assist from family for IADLs.  Upon eval, she requires min guard assist for functional mobility and standing components of ADLs.  Educated pt on recommendation for 24/7 assist from family member upon return home, and pt agreed.  Recommending 3n1 for home. Will continue to follow acutely in order to address deficits listed below and to improve independence and safety with ADLs.    Follow Up Recommendations  No OT follow up;Supervision/Assistance - 24 hour    Equipment Recommendations  3 in 1 bedside commode    Recommendations for Other Services       Precautions / Restrictions Precautions Precautions: Fall Precaution Comments: Pt reports fall out of car where she hit her head.  Restrictions Weight Bearing Restrictions: No      Mobility Bed Mobility Overal bed mobility: Needs Assistance Bed Mobility: Supine to Sit;Sit to Supine     Supine to sit: Supervision Sit to supine: Supervision      Transfers Overall transfer level: Needs assistance Equipment used: 1 person hand held assist Transfers: Sit to/from Stand Sit to Stand: Min guard              Balance Overall balance assessment: Needs assistance Sitting-balance support: No upper extremity supported;Feet supported Sitting balance-Leahy Scale: Good     Standing balance support: Bilateral upper extremity supported;During functional activity Standing balance-Leahy Scale: Poor Standing balance comment: Reliant on at least single UE support                            ADL either performed or assessed with clinical judgement   ADL Overall ADL's : Needs assistance/impaired     Grooming: Min guard;Standing;Wash/dry hands   Upper Body Bathing: Set up;Sitting   Lower Body Bathing: Min guard;Sit to/from stand   Upper Body Dressing : Set up;Sitting   Lower Body Dressing: Min guard;Sit to/from stand   Toilet Transfer: Min guard;Ambulation;BSC   Toileting- Water quality scientist and Hygiene: Min guard;Sit to/from stand       Functional mobility during ADLs: Min guard General ADL Comments: Pt able to don and doff socks independently.  Min guard assist for steadying, single hand held assist 50% of time during transfers and standing tasks.  Pt ambulated to [facility] (could not control bladder for walk across room to bathroom).  Discussed therapist recommendation to have a family member stay with her 24/7 upon initial return hom and patient agreed.     Vision         Perception     Praxis      Pertinent Vitals/Pain Pain Assessment: No/denies pain     Hand Dominance Right   Extremity/Trunk Assessment Upper Extremity Assessment Upper Extremity Assessment: Overall WFL for tasks assessed       Cervical / Trunk Assessment Cervical / Trunk Assessment: Kyphotic   Communication Communication Communication: No difficulties   Cognition Arousal/Alertness: Awake/alert Behavior During Therapy: WFL for tasks assessed/performed Overall Cognitive Status: Within Functional Limits for tasks assessed  General Comments  Pt on RA throughout session, O2 at 94%.     Exercises     Shoulder Instructions      Home Living Family/patient expects to be discharged to:: Private residence Living Arrangements: Children Available Help at Discharge: Family;Available 24 hours/day Type of Home: House Home Access: Stairs to enter [employer] of Steps: 1 Entrance Stairs-Rails: Right Home Layout: One  level     Bathroom Shower/Tub: Teacher, early years/pre: Standard     Home Equipment: Cane - single point          Prior Functioning/Environment Level of Independence: Independent with assistive device(s)        Comments: Using cane for ambulation. Children or other family members assist with grocery shopping.        OT Problem List: Decreased strength;Decreased activity tolerance;Decreased knowledge of use of DME or AE      OT Treatment/Interventions: Self-care/ADL training;Therapeutic exercise;DME and/or AE instruction;Therapeutic activities;Patient/family education    OT Goals(Current goals can be found in the care plan section) Acute Rehab OT Goals Patient Stated Goal: to go home  OT Goal Formulation: With patient Time For Goal Achievement: 03/23/17 Potential to Achieve Goals: Good ADL Goals Pt Will Perform Lower Body Dressing: with modified independence;sit to/from stand Pt Will Transfer to Toilet: with modified independence;ambulating Pt Will Perform Tub/Shower Transfer: with supervision;ambulating  OT Frequency: Min 2X/week   Barriers to D/C:            Co-evaluation              AM-PAC PT "6 Clicks" Daily Activity     Outcome Measure Help from another person eating meals?: None Help from another person taking care of personal grooming?: None Help from another person toileting, which includes using toliet, bedpan, or urinal?: A Little Help from another person bathing (including washing, rinsing, drying)?: A Little Help from another person to put on and taking off regular upper body clothing?: None Help from another person to put on and taking off regular lower body clothing?: A Little 6 Click Score: 21   End of Session Nurse Communication: Mobility status  Activity Tolerance: Patient tolerated treatment well Patient left: in bed;with call bell/phone within reach  OT Visit Diagnosis: Unsteadiness on feet (R26.81)                Time:  8338-2505 OT Time Calculation (min): 20 min Charges:  OT General Charges $OT Visit: 1 Visit OT Evaluation $OT Eval Low Complexity: 1 Low G-Codes:      Darrol Jump OTR/L 03/09/2017, 8:56 AM

## 2017-03-09 NOTE — Discharge Summary (Signed)
Physician Discharge Summary  Julie Lara PPJ:[id number] DOB: 11/02/1920 DOA: 03/07/2017  PCP: Burnard Bunting, MD  Admit date: 03/07/2017 Discharge date: 03/09/2017  Admitted From: Home  Disposition:  Home   Recommendations for Outpatient Follow-up:  1. Follow up with PCP in 1-2 weeks 2. Please obtain BMP/CBC in one week 3. Follow up with Dr Ardis Hughs for further evaluation of dysphagia.  4. Follow up with cardiology for further adjustment of diuretic.   Home Health: no   Discharge Condition: Stable.  CODE STATUS: full code.  Diet recommendation: soft diet   Brief/Interim Summary: Julie Lara a 83 y.o.femalewith medical history significant forcoronary artery disease, chronic systolic CHF, atrial fibrillation not anticoagulated, and hypothyroidism, now presenting to the emergency department for evaluation of aggressive shortness of breath, lower extremity swelling, and weight gain over the past week. Patient reportedly had her Lasix doubled recently by her cardiologist, but has continued to experience progressive dyspnea over the past week with increased bilateral lower extremity edema and a 7 pound weight gain. With her symptoms continuing to worsen, and the patient in apparent distress, family called 911. EMS found the patient to be hypoxic, placed on supplemental oxygen, and transported her to the hospital. She denies any chest pain or significant cough. Denies fevers or chills.  ED Course:Upon arrival to the ED, patient is found to be afebrile, requiring 4 L/min of supplemental oxygen in order to maintain adequate saturations, tachycardic in the low 100s, and modestly hypertensive. EKG features atrial fibrillation with rate 105 and QTc interval of 516 ms. Chest x-ray is notable for a small right pleural effusion. Chemistry panel reveals a creatinine 1.25, consistent with her apparent baseline. CBC is notable for a stable chronic normocytic anemia with hemoglobin of  10.7. Troponin is within normal limits and BNP is elevated at 1583. Patient was treated with 40 mg IV Lasix and glycerin ointment in the ED. Heart rate remained between 100 and 120,blood pressure remained stable, patient is not in any acute distress, and she will be admitted to the telemetry unit for ongoing evaluation and management of acute on chronic systolic CHF with hypoxia.     Assessment & Plan:   Principal Problem:   Acute on chronic systolic CHF (congestive heart failure) (HCC) Active Problems:   Hypothyroidism   Atrial fibrillation (HCC)   CKD (chronic kidney disease), stage III (HCC)   Coronary artery disease with history of myocardial infarction without history of CABG   Essential hypertension  1-Acute Hypoxic Respiratory failure. Presents hypoxic, chest x ray with pulmonary edema, increase BNP. Likely related to Heart failure Exacerbation.  Improved with diuresis.  Treated  with IV lasix.  resolved.   Acute on chronic systolic HF exacerbation;  -TTE (02/02/17) with EF 35-40%, diffuse HK, mild TR, moderate AI and MR, severe LAE -increase BNP, Chest x ray with pulmonary edema.  Treated with IV lasix BID. Weight down to 127. From 125.  Discharge on oral lasix 40 mg daily. Off oxygen./    3-CKD stage III. Cr baseline 1.2.  Monitor on lasix. Change to oral lasix.   4-CAD; Continue with aspirin, statin, BB.   5-HTN; on lopressor.   6-Dysphagia, shocking episodes. Esophagogram with stricture.  GI consulted. Plan for soft diet, trial PPI and follow up out patient to determine need for dilation.     Discharge Diagnoses:  Principal Problem:   Acute on chronic systolic CHF (congestive heart failure) (HCC) Active Problems:   Hypothyroidism   Atrial fibrillation ([place])  CKD (chronic kidney disease), stage III (HCC)   Coronary artery disease with history of myocardial infarction without history of CABG   Essential hypertension    Discharge  Instructions  Discharge Instructions    Increase activity slowly   Complete by:  As directed      Allergies as of 03/09/2017   No Known Allergies     Medication List    TAKE these medications   aspirin EC 81 MG tablet Take 81 mg by mouth daily.   atorvastatin 40 MG tablet Commonly known as:  LIPITOR Take 1 tablet (40 mg total) by mouth daily at 6 PM.   feeding supplement (NEPRO CARB STEADY) Liqd Take 237 mLs 2 (two) times daily between meals by mouth.   FERREX 150 150 MG capsule Generic drug:  iron polysaccharides Take 150 mg by mouth 2 (two) times daily.   furosemide 40 MG tablet Commonly known as:  LASIX Take 1 tablet (40 mg total) by mouth daily. What changed:    medication strength  how much to take  when to take this  additional instructions   levothyroxine 25 MCG tablet Commonly known as:  SYNTHROID, LEVOTHROID Take 25 mcg by mouth daily before breakfast.   metoprolol tartrate 25 MG tablet Commonly known as:  LOPRESSOR Take 1 tablet (25 mg total) 2 (two) times daily by mouth.   nitroGLYCERIN 0.4 MG SL tablet Commonly known as:  NITROSTAT Place 1 tablet (0.4 mg total) under the tongue every 5 (five) minutes as needed for chest pain.   pantoprazole 40 MG tablet Commonly known as:  PROTONIX Take 1 tablet (40 mg total) by mouth daily at 6 (six) AM.   VITAMIN B-12 PO Take 1 tablet by mouth daily.   VITAMIN D PO Take 1,000 Units by mouth daily.       No Known Allergies  Consultations: GI   Procedures/Studies: Dg Chest 2 View  Result Date: 03/07/2017 CLINICAL DATA:  Shortness of breath and weakness for 1 week EXAM: CHEST  2 VIEW COMPARISON:  02/27/2017 FINDINGS: Cardiac shadow is enlarged. Lungs are well aerated bilaterally. Small right-sided pleural effusion is noted. No focal infiltrate is noted. No acute bony abnormality is seen. IMPRESSION: Small right pleural effusion.  No focal infiltrate is seen. Electronically Signed   By: Inez Catalina  M.D.   On: 03/07/2017 20:14   Dg Chest 2 View  Result Date: 02/27/2017 CLINICAL DATA:  Shortness of breath for several days EXAM: CHEST  2 VIEW COMPARISON:  02/01/2017 FINDINGS: Cardiomegaly. Cardiomegaly and vascular pedicle widening. Indistinctness of the hila with cephalized blood flow. No Kerley lines. No effusion or pneumothorax. Chronic lower thoracic wedging. IMPRESSION: Cardiomegaly and vascular congestion. Electronically Signed   By: Monte Fantasia M.D.   On: 02/27/2017 16:34   Dg Esophagus  Result Date: 03/08/2017 CLINICAL DATA:  Dysphagia. EXAM: ESOPHOGRAM/BARIUM SWALLOW TECHNIQUE: Single contrast examination was performed using  thin barium. FLUOROSCOPY TIME:  Fluoroscopy Time:  2 minutes and 6 seconds Radiation Exposure Index (if provided by the fluoroscopic device): Not applicable. Number of Acquired Spot Images: 0 COMPARISON:  Chest radiograph 03/07/2017 FINDINGS: Focused single-contrast exam performed with patient in an LPO position. Esophageal motility is grossly normal, but suboptimally evaluated secondary to patient age and clinical status/positioning. No upper esophageal stricture identified. There is an area of persistent underdistention at the gastroesophageal junction, best seen on image 122/series 3. Mild spontaneous gastroesophageal reflux identified to the lower esophagus. Delayed passage of a 13 mm barium tablet at  the distal esophagus. IMPRESSION: Suspect a moderate, likely reflux induced stricture involving the gastroesophageal junction. Delayed passage of a 13 mm barium tablet at this level, likely secondary. Electronically Signed   By: Abigail Miyamoto M.D.   On: 03/08/2017 14:39      Subjective: She is feeling well, wants to go home.   Discharge Exam: Vitals:   03/09/17 0829 03/09/17 1200  BP:  101/60  Pulse:  97  Resp:  18  Temp:  (!) 97.5 F (36.4 C)  SpO2: 97% 93%   Vitals:   03/09/17 0455 03/09/17 0708 03/09/17 0829 03/09/17 1200  BP: 118/69   101/60   Pulse: 77   97  Resp: 16   18  Temp: 98.6 F (37 C)   (!) 97.5 F (36.4 C)  TempSrc: Oral   Oral  SpO2: 99%  97% 93%  Weight:  58 kg (127 lb 13.9 oz)    Height:        General: Pt is alert, awake, not in acute distress Cardiovascular: RRR, S1/S2 +, no rubs, no gallops Respiratory: CTA bilaterally, no wheezing, no rhonchi Abdominal: Soft, NT, ND, bowel sounds + Extremities: no edema, no cyanosis    The results of significant diagnostics from this hospitalization (including imaging, microbiology, ancillary and laboratory) are listed below for reference.     Microbiology: No results found for this or any previous visit (from the past 240 hour(s)).   Labs: BNP (last 3 results) Recent Labs    02/01/17 1701 03/07/17 1906  BNP 942.7* 1,607.3*   Basic Metabolic Panel: Recent Labs  Lab 03/07/17 1906 03/08/17 0310 03/09/17 0403  NA 137 138 138  K 4.6 4.2 3.5  CL 105 103 97*  CO2 24 28 30   GLUCOSE 127* 112* 85  BUN 29* 28* 27*  CREATININE 1.25* 1.28* 1.35*  CALCIUM 9.3 9.2 8.6*  MG  --  2.2  --    Liver Function Tests: No results for input(s): AST, ALT, ALKPHOS, BILITOT, PROT, ALBUMIN in the last 168 hours. No results for input(s): LIPASE, AMYLASE in the last 168 hours. No results for input(s): AMMONIA in the last 168 hours. CBC: Recent Labs  Lab 03/07/17 1906 03/09/17 0403  WBC 5.2 5.6  HGB 10.7* 10.7*  HCT 33.3* 34.1*  MCV 92.5 93.4  PLT 152 154   Cardiac Enzymes: No results for input(s): CKTOTAL, CKMB, CKMBINDEX, TROPONINI in the last 168 hours. BNP: Invalid input(s): POCBNP CBG: No results for input(s): GLUCAP in the last 168 hours. D-Dimer No results for input(s): DDIMER in the last 72 hours. Hgb A1c No results for input(s): HGBA1C in the last 72 hours. Lipid Profile No results for input(s): CHOL, HDL, LDLCALC, TRIG, CHOLHDL, LDLDIRECT in the last 72 hours. Thyroid function studies No results for input(s): TSH, T4TOTAL, T3FREE, THYROIDAB in the  last 72 hours.  Invalid input(s): FREET3 Anemia work up No results for input(s): VITAMINB12, FOLATE, FERRITIN, TIBC, IRON, RETICCTPCT in the last 72 hours. Urinalysis    Component Value Date/Time   COLORURINE YELLOW 10/14/2012 2010   APPEARANCEUR HAZY (A) 10/14/2012 2010   LABSPEC 1.017 10/14/2012 2010   PHURINE 5.5 10/14/2012 2010   GLUCOSEU NEGATIVE 10/14/2012 2010   HGBUR TRACE (A) 10/14/2012 2010   BILIRUBINUR NEGATIVE 10/14/2012 2010   [place] 10/14/2012 2010   PROTEINUR NEGATIVE 10/14/2012 2010   UROBILINOGEN 0.2 10/14/2012 2010   NITRITE POSITIVE (A) 10/14/2012 2010   LEUKOCYTESUR MODERATE (A) 10/14/2012 2010   Sepsis Labs Invalid input(s): PROCALCITONIN,  WBC,  LACTICIDVEN Microbiology No results found for this or any previous visit (from the past 240 hour(s)).   Time coordinating discharge: Over 30 minutes  SIGNED:   Elmarie Shiley, MD  [facility] 03/09/2017, 12:04 PM Pager   If 7PM-7AM, please contact night-coverage [email or web address] Password [email or web address]

## 2017-03-09 NOTE — Plan of Care (Signed)
Discharge instructions reviewed with patient and daughter, questions answered, verbalized understanding.  Patient awaiting ride from daughter for discharge.

## 2017-03-11 ENCOUNTER — Other Ambulatory Visit: Payer: Self-pay | Admitting: *Deleted

## 2017-03-11 ENCOUNTER — Ambulatory Visit: Payer: Medicare Other | Admitting: Physician Assistant

## 2017-03-11 NOTE — Patient Outreach (Signed)
[place] [facility]) Care Management  03/11/2017  HUSNA KRONE 1921/01/21 [id number]   Member discharged from hospital on 12/8 for heart failure complications.  Call placed to daughter, [name] for transition of care.  She state the member is doing much better, report weight yesterday was 127 pounds.  Weight upon arrival to ED was 135 pounds.  Request for this care manager to call the member's home to speak with her other sister to obtain specifics on member's health today.  Call then placed to [facility], spoke with daughter [name].  She also state member is doing well.  Member heard in the background, denies shortness of breath or chest discomfort.  Also denies swelling in legs/feet.  Report compliance with medications, now taking Furosemide daily instead of every other day.  Weight today 128 pound.  Discussed heart failure zones and when to notify MD.  Denies questions at this time, will follow up next week with home visit.   THN CM Care Plan Problem One     Most Recent Value  Care Plan Problem One  Risk for readmission related to heart failure management as evidenced by recent hospitalization  Role Documenting the Problem One  Care Management [place] for Problem One  Active  [facility] Long Term Goal   Member will not be readmitted to hospital within 31 days of discharge  THN Long Term Goal Start Date  03/11/17  Interventions for Problem One Long Term Goal  Discussed with member the importance of following discharge instructions, including follow up appointments, medications, diet, to decrease the risk of readmission  THN CM Short Term Goal #1   Member will weigh self and record readings daily over the next 4 weeks  THN CM Short Term Goal #1 Start Date  02/04/17  Interventions for Short Term Goal #1  Educated daughter the importance of daily weights and how it relates to manageing fluid status/heart failure.  THN CM Short Term Goal #2   Member will have follow up  appointment with primary and/or cardiologist within the next 2 weeks  THN CM Short Term Goal #2 Start Date  03/11/17  Interventions for Short Term Goal #2  Appointment canceled today due to weather, confirmed with daughter rescheduled for later this week.       Valente David, MSN, RN [facility] Care Management  [facility] Manager [phone]

## 2017-03-13 ENCOUNTER — Telehealth: Payer: Self-pay

## 2017-03-13 DIAGNOSIS — J309 Allergic rhinitis, unspecified: Secondary | ICD-10-CM | POA: Diagnosis not present

## 2017-03-13 DIAGNOSIS — R410 Disorientation, unspecified: Secondary | ICD-10-CM | POA: Diagnosis not present

## 2017-03-13 DIAGNOSIS — I5022 Chronic systolic (congestive) heart failure: Secondary | ICD-10-CM | POA: Diagnosis not present

## 2017-03-13 DIAGNOSIS — N183 Chronic kidney disease, stage 3 (moderate): Secondary | ICD-10-CM | POA: Diagnosis not present

## 2017-03-13 DIAGNOSIS — I129 Hypertensive chronic kidney disease with stage 1 through stage 4 chronic kidney disease, or unspecified chronic kidney disease: Secondary | ICD-10-CM | POA: Diagnosis not present

## 2017-03-13 DIAGNOSIS — I4891 Unspecified atrial fibrillation: Secondary | ICD-10-CM | POA: Diagnosis not present

## 2017-03-13 DIAGNOSIS — I420 Dilated cardiomyopathy: Secondary | ICD-10-CM | POA: Diagnosis not present

## 2017-03-13 DIAGNOSIS — N189 Chronic kidney disease, unspecified: Secondary | ICD-10-CM | POA: Diagnosis not present

## 2017-03-13 DIAGNOSIS — E785 Hyperlipidemia, unspecified: Secondary | ICD-10-CM | POA: Diagnosis not present

## 2017-03-13 DIAGNOSIS — Z811 Family history of alcohol abuse and dependence: Secondary | ICD-10-CM | POA: Diagnosis not present

## 2017-03-13 DIAGNOSIS — C801 Malignant (primary) neoplasm, unspecified: Secondary | ICD-10-CM | POA: Diagnosis not present

## 2017-03-13 DIAGNOSIS — D649 Anemia, unspecified: Secondary | ICD-10-CM | POA: Diagnosis not present

## 2017-03-13 NOTE — Telephone Encounter (Signed)
-----   Message from Milus Banister, MD sent at 03/09/2017 11:55 AM EST ----- She needs rov with me or JEss in 5-6 week for hosp follow up.   Thanks

## 2017-03-13 NOTE — Telephone Encounter (Signed)
05/07/17 2:15 pm follow up

## 2017-03-19 ENCOUNTER — Other Ambulatory Visit: Payer: Self-pay | Admitting: *Deleted

## 2017-03-19 NOTE — Patient Outreach (Signed)
[place] [facility]) Care Management   03/19/2017  Julie Lara 1920/08/02 [id number]  Julie Lara is an 83 y.o. female  Subjective:   Member alert and oriented x3, denies pain or discomfort at this time.  Report compliance with diet, daily weights, and medication compliance.  Daughter present and confirms.  Objective:   Review of Systems  Constitutional: Negative.   HENT: Negative.   Eyes: Negative.   Respiratory: Negative.   Cardiovascular: Positive for leg swelling.       1+  Gastrointestinal: Negative.   Genitourinary: Negative.   Musculoskeletal: Negative.   Skin: Negative.     Physical Exam  Constitutional: She is oriented to person, place, and time. She appears well-developed and well-nourished.  Neck: Normal range of motion.  Cardiovascular: Normal heart sounds.  Irregular  Respiratory: Effort normal and breath sounds normal.  GI: Soft. Bowel sounds are normal.  Musculoskeletal: Normal range of motion.  Neurological: She is alert and oriented to person, place, and time.  Skin: Skin is warm and dry.   BP 126/84 (BP Location: Left Arm, Patient Position: Sitting, Cuff Size: Normal)   Pulse 96   Resp 18   Wt 128 lb (58.1 kg)   SpO2 97%   BMI 24.20 kg/m   Encounter Medications:   Outpatient Encounter Medications as of 03/19/2017  Medication Sig  . aspirin EC 81 MG tablet Take 81 mg by mouth daily.  Marland Kitchen atorvastatin (LIPITOR) 40 MG tablet Take 1 tablet (40 mg total) by mouth daily at 6 PM.  . Cholecalciferol (VITAMIN D PO) Take 1,000 Units by mouth daily.   . Cyanocobalamin (VITAMIN B-12 PO) Take 1 tablet by mouth daily.  . furosemide (LASIX) 40 MG tablet Take 1 tablet (40 mg total) by mouth daily.  . iron polysaccharides (FERREX 150) 150 MG capsule Take 150 mg by mouth 2 (two) times daily.   Marland Kitchen levothyroxine (SYNTHROID, LEVOTHROID) 25 MCG tablet Take 25 mcg by mouth daily before breakfast.  . metoprolol tartrate (LOPRESSOR) 25 MG tablet Take 1  tablet (25 mg total) 2 (two) times daily by mouth.  . nitroGLYCERIN (NITROSTAT) 0.4 MG SL tablet Place 1 tablet (0.4 mg total) under the tongue every 5 (five) minutes as needed for chest pain.  . Nutritional Supplements (FEEDING SUPPLEMENT, NEPRO CARB STEADY,) LIQD Take 237 mLs 2 (two) times daily between meals by mouth.  . pantoprazole (PROTONIX) 40 MG tablet Take 1 tablet (40 mg total) by mouth daily at 6 (six) AM.   No facility-administered encounter medications on file as of 03/19/2017.     Functional Status:   In your present state of health, do you have any difficulty performing the following activities: 03/08/2017 03/08/2017  Hearing? - Y  Vision? - N  Comment - -  Difficulty concentrating or making decisions? - N  Walking or climbing stairs? - Y  Comment - -  Dressing or bathing? - Y  Doing errands, shopping? [place] and eating ? - -  Using the Toilet? - -  In the past six months, have you accidently leaked urine? - -  Do you have problems with loss of bowel control? - -  Managing your Medications? - -  Managing your Finances? - -  Housekeeping or managing your Housekeeping? - -  Some recent data might be hidden    Fall/Depression Screening:    Fall Risk  02/18/2017  Falls in the past year? No   PHQ  2/9 Scores 02/18/2017 11/19/2012  PHQ - 2 Score 0 0    Assessment:    Met with member at scheduled time.  Heart failure management again reviewed.  Daughter report that she has a hard time getting member to elevate her legs during the day.  Rationale for doing so reiterated to member.  Daughter also discuss member's difficulty sleeping at night, was advised by primary MD to take either Benadryl or Melatonin for sleep aid.  Member reluctant to take but agrees to try Melatonin after discuss of benefit of having her body well rested.  They both deny any other concerns at this time, advised to contact with questions.  Plan:   Will follow up next week  with weekly transition of care call.    THN CM Care Plan Problem One     Most Recent Value  Care Plan Problem One  Risk for readmission related to heart failure management as evidenced by recent hospitalization  Role Documenting the Problem One  Care Management [place] for Problem One  Active  [facility] Long Term Goal   Member will not be readmitted to hospital within 31 days of discharge  THN Long Term Goal Start Date  03/11/17  Interventions for Problem One Long Term Goal  Member & daughter re-educated on heart failure zones and when to contact MD  [facility] CM Short Term Goal #1   Member will weigh self and record readings daily over the next 4 weeks  THN CM Short Term Goal #1 Start Date  03/11/17  Interventions for Short Term Goal #1  Member and daughter educated on interventions to help keep weight stable and decrease fluid levels (such as exercise/walking, medication adherence, fluid restriction, and elevating legs)  THN CM Short Term Goal #2   Member will have follow up appointment with primary and/or cardiologist within the next 2 weeks  THN CM Short Term Goal #2 Start Date  03/11/17  [facility] CM Short Term Goal #2 Met Date  03/19/17  Interventions for Short Term Goal #2  Appointment canceled today due to weather, confirmed with daughter rescheduled for later this week.     Valente David, [place], MSN [place] [phone]

## 2017-03-25 ENCOUNTER — Other Ambulatory Visit: Payer: Self-pay | Admitting: *Deleted

## 2017-03-25 NOTE — Patient Outreach (Signed)
[place] [facility]) Care Management  03/25/2017  REGINNA SERMENO 02/18/1921 [id number]   Weekly transition of care call placed to member, no answer.  Unable to leave a voice message as phone continues to ring, no recording noted.  Will follow up next week.  Valente David, [place], MSN [place] [phone]

## 2017-03-29 ENCOUNTER — Encounter: Payer: Self-pay | Admitting: Physical Therapy

## 2017-03-29 ENCOUNTER — Ambulatory Visit: Payer: Medicare Other | Attending: Cardiology | Admitting: Physical Therapy

## 2017-03-29 DIAGNOSIS — R2689 Other abnormalities of gait and mobility: Secondary | ICD-10-CM | POA: Diagnosis not present

## 2017-03-29 DIAGNOSIS — M6281 Muscle weakness (generalized): Secondary | ICD-10-CM

## 2017-03-29 DIAGNOSIS — R2681 Unsteadiness on feet: Secondary | ICD-10-CM | POA: Insufficient documentation

## 2017-03-29 DIAGNOSIS — R293 Abnormal posture: Secondary | ICD-10-CM

## 2017-04-01 ENCOUNTER — Other Ambulatory Visit: Payer: Self-pay | Admitting: *Deleted

## 2017-04-01 ENCOUNTER — Ambulatory Visit: Payer: Medicare Other | Admitting: Rehabilitation

## 2017-04-01 NOTE — Patient Outreach (Signed)
[place] [facility]) Care Management  04/01/2017  Julie Lara 04-24-1920 [id number]   Weekly transition of care call placed to daughter, Julie Lara.  She report that the member has been doing "really well."  State she has continued to take medications as prescribed and weigh self daily.  She is unsure what today's weight is and request this care manager call her sister to confirm weight.  She remains concerned about member's inability to sleep at night although she is asleep during the day.  Member has refused to take benadryl or melatonin as recommended by her MD.  Julie Lara also state member will not consistently elevate legs when sitting nor will she consider wearing compression stockings.  Call placed to daughter Julie Lara to review weights.  Report weight today is 131 pounds, 130 yesterday.  Member has consistently been between 128-131 pounds since discharge.  She too state member is doing well, denies shortness breath or chest discomfort.  Denies any urgent concerns, will follow up next week telephonically.    THN CM Care Plan Problem One     Most Recent Value  Care Plan Problem One  Risk for readmission related to heart failure management as evidenced by recent hospitalization  Role Documenting the Problem One  Care Management [place] for Problem One  Active  [facility] Long Term Goal   Member will not be readmitted to hospital within 31 days of discharge  THN Long Term Goal Start Date  03/11/17  Interventions for Problem One Long Term Goal  Weight management reviewed with second daughter, importance of continuing daily weights reviewed.  Educated on signs/symptoms of heart failure exacerbation.  THN CM Short Term Goal #1   Member will weigh self and record readings daily over the next 4 weeks  THN CM Short Term Goal #1 Start Date  03/11/17  [facility] CM Short Term Goal #1 Met Date  04/01/17  Interventions for Short Term Goal #1  Member and daughter educated on interventions to help  keep weight stable and decrease fluid levels (such as exercise/walking, medication adherence, fluid restriction, and elevating legs)  THN CM Short Term Goal #2   Member will have follow up appointment with primary and/or cardiologist within the next 2 weeks  THN CM Short Term Goal #2 Start Date  03/11/17  [facility] CM Short Term Goal #2 Met Date  03/19/17  Interventions for Short Term Goal #2  Appointment canceled today due to weather, confirmed with daughter rescheduled for later this week.      Julie Lara, [place], MSN [place] [phone]

## 2017-04-01 NOTE — Therapy (Signed)
[facility] [address] [place], [place], [address] Phone: [phone]   Fax:  [phone]  Physical Therapy Evaluation  Patient Details  Name: Julie Lara MRN: [id number] Date of Birth: 1920-11-12 Referring Provider: Martinique, Peter MD   Encounter Date: 03/29/2017  PT End of Session - 04/01/17 1711    Visit Number  1    Number of Visits  9    Date for PT Re-Evaluation  05/28/17    Authorization Type  Medicare    Authorization Time Period  03/29/17-05/28/17    PT Start Time  1315    PT Stop Time  1352    PT Time Calculation (min)  37 min    Equipment Utilized During Treatment  Gait belt    Activity Tolerance  Patient tolerated treatment well    Behavior During Therapy  [facility] for tasks assessed/performed       Past Medical History:  Diagnosis Date  . Acute blood loss anemia    November 2014  . Aortic insufficiency    mild to moderate  . Arthritis    "right upper arm" (02/01/2017)  . Atrial fibrillation ([place])   . CAD (coronary artery disease)   . Chest pain 04-2010   atypical with negative echocardiogram  . Dilated cardiomyopathy ([place])   . Goiter   . Hypothyroidism   . Idiopathic cardiomyopathy ([place]) 1999   EF 40% by Echo on 10/13/12  . NSTEMI (non-ST elevated myocardial infarction) [facility]) July 2014   95% prox RCA s/p BMS  . PAC (premature atrial contraction)    Chronic    Past Surgical History:  Procedure Laterality Date  . biopsy of sacral lesion  2010  . CARDIAC CATHETERIZATION    . COLONOSCOPY N/A 02/23/2013   Procedure: COLONOSCOPY;  Surgeon: Ladene Artist, MD;  Location: [facility] ENDOSCOPY;  Service: Endoscopy;  Laterality: N/A;  . CORONARY ANGIOPLASTY WITH STENT PLACEMENT  09/2012   BMS-95% prox RCA  . ESOPHAGOGASTRODUODENOSCOPY N/A 02/22/2013   Procedure: ESOPHAGOGASTRODUODENOSCOPY (EGD);  Surgeon: Milus Banister, MD;  Location: [facility];  Service: Endoscopy;  Laterality: N/A;  . LEFT  HEART CATHETERIZATION WITH CORONARY ANGIOGRAM N/A 10/14/2012   Procedure: LEFT HEART CATHETERIZATION WITH CORONARY ANGIOGRAM;  Surgeon: Wellington Hampshire, MD;  Location: [facility] CATH LAB;  Service: Cardiovascular;  Laterality: N/A;  . PERCUTANEOUS CORONARY STENT INTERVENTION (PCI-S) Right 10/14/2012   Procedure: PERCUTANEOUS CORONARY STENT INTERVENTION (PCI-S);  Surgeon: Wellington Hampshire, MD;  Location: [facility] CATH LAB;  Service: Cardiovascular;  Laterality: Right;  . THYROIDECTOMY  2008  . VAGINAL HYSTERECTOMY     Complete    There were no vitals filed for this visit.   Subjective Assessment - 04/01/17 1656    Subjective  Doctor wants me to get stronger. Doesn't currently exercise. It's been at least a couple of years ago since she attended [employer] for seated exercise classes.    Patient is accompained by:  Family member daughter, [name]    Pertinent History  coronary artery disease, chronic systolic CHF, atrial fibrillation not anticoagulated, and hypothyroidism    Patient Stated Goals  I guess I want to get stronger         [facility] PT Assessment - 04/01/17 0001      Assessment   Medical Diagnosis  physical deconditioning    Referring Provider  Martinique, Peter MD    Prior Therapy  at our OP clinic several years ago      Precautions  Precautions  Fall      Restrictions   Weight Bearing Restrictions  No      Balance Screen   Has the patient fallen in the past 6 months  No      [place] residence    Living Arrangements  Alone;Children daughter has been staying with her    Available Help at Discharge  Family daughter works days and stays in the evening    Type of [place] to enter    Entrance Stairs-Number of Steps  1    [place]  One level    [place] - single point;[place] - 4 wheels family got the rollator, but she has not had to use yet      Prior Function   Level  of Independence  Needs assistance with homemaking    Leisure  likes to garden      Cognition   Overall Cognitive Status  Within Functional Limits for tasks assessed      Observation/Other Assessments   Observations  walks with cane and wide BOS    Focus on Therapeutic Outcomes (FOTO)   N/A      Observation/Other Assessments-Edema    Edema  Circumferential bil LEs      Sensation   Light Touch  Appears Intact      Coordination   Gross Motor Movements are Fluid and Coordinated  Yes    Heel Shin Test  WNL      Posture/Postural Control   Posture/Postural Control  Postural limitations    Postural Limitations  Forward head;Rounded Shoulders      ROM / Strength   AROM / PROM / Strength  AROM;Strength      AROM   Overall AROM   Within functional limits for tasks performed bil LEs      Strength   Overall Strength  Deficits    Overall Strength Comments  lt DF 4/5, Rt 5/5, knee ext bil 4/5, knee flex bil 3+/5      Transfers   Transfers  Sit to Stand;Stand to Sit    Sit to Stand  6: Modified independent (Device/Increase time);With armrests;With upper extremity assist    Five time sit to stand comments   14.75 with armrests and bil UEs    Stand to Sit  6: Modified independent (Device/Increase time);With upper extremity assist;With armrests      Ambulation/Gait   Ambulation/Gait  Yes    Ambulation/Gait Assistance  4: Min guard    Ambulation Distance (Feet)  40 Feet 75    Assistive device  Straight cane    Gait Pattern  Step-through pattern;Decreased stride length;Right foot flat;Left foot flat;Right flexed knee in stance;Left flexed knee in stance;Shuffle;Trunk flexed;Wide base of support    Ambulation Surface  Indoor    Gait velocity  32.8/21.91=1.50 ft/sec      Standardized Balance Assessment   Standardized Balance Assessment  Timed Up and Go Test      Timed Up and Go Test   Normal TUG (seconds)  24.69             Objective measurements completed on examination: See  above findings.              PT Education - 04/01/17 1710    Education provided  Yes    Education Details  results of PT eval; PT POC  and goals    Person(s) Educated  Patient;Child(ren)    Methods  Explanation    Comprehension  Verbalized understanding       PT Short Term Goals - 04/01/17 1735      PT SHORT TERM GOAL #1   Title  Complete 6 MWT and set LTG as appropriate.    Time  1    Period  Weeks    Status  New    Target Date  04/05/17        PT Long Term Goals - 04/01/17 1702      PT LONG TERM GOAL #1   Title  Pt will perform home exercises for balance and strengthening with mod I using paper handout . Target date for STGs: 04/28/17    Time  4    Period  Weeks    Status  New    Target Date  04/28/17      PT LONG TERM GOAL #2   Title  Patient will improve normal TUG to <19 seconds to indicate progress towards lower fall risk.     Baseline  03/29/17  24.69 sec    Time  4    Period  Weeks    Status  New      PT LONG TERM GOAL #3   Title  Patient will improve gait velocity to >=1.8 ft/sec to indicate lesser fall risk.      Baseline  03/29/17 1.5 ft/sec    Time  4    Period  Weeks    Status  New      PT LONG TERM GOAL #4   Title  Patient will ambulate >= 300 ft with LRAD modified independent over level indoor surfaces.     Time  4    Period  Weeks    Status  New             Plan - 04/01/17 1718    Clinical Impression Statement  Patient presented for evaluation with diagnosis of physical deconditioning. Patient reports she has become weaker and has difficulty standing from chairs when she goes out to eat. She denies recent falls, however her gait velocity and TUG measures both put her in a high fall risk group. Patient has previously participated in [place] and afterwards she participated in community exercise classes at [facility]. Anticipate she can benefit from PT to reduce her fall risk and prepare her for participating in group classes  upon discharge.     History and Personal Factors relevant to plan of care:  PMH-CAD, chronic CHF, atrial fibrillation without anticoagulation  Personal Factors-age,     Clinical Presentation  Evolving    Clinical Presentation due to:  2-3 personal factors/co-morbidities; 2 recent hospitalizations for CHF exacerbations    Clinical Decision Making  Moderate    Rehab Potential  Good    Clinical Impairments Affecting Rehab Potential  recently poorly controlled CHF    PT Frequency  2x / week    PT Duration  4 weeks    PT Treatment/Interventions  ADLs/Self Care Home Management;DME Instruction;Gait training;Balance training;Therapeutic exercise;Therapeutic activities;Functional mobility training;Patient/family education;Passive range of motion;Energy conservation    PT Next Visit Plan  do 6 MWT and set goal as appropriate; initiate HEP for LE strength and balance;     Consulted and Agree with Plan of Care  Patient;Family member/caregiver    Family Member Consulted  daughter       Patient will benefit from skilled therapeutic intervention in  order to improve the following deficits and impairments:  Abnormal gait, Cardiopulmonary status limiting activity, Decreased activity tolerance, Decreased balance, Decreased mobility, Decreased knowledge of use of DME, Decreased endurance, Decreased strength, Increased edema, Postural dysfunction  Visit Diagnosis: Muscle weakness (generalized) - Plan: PT plan of care cert/re-cert  Unsteadiness on feet - Plan: PT plan of care cert/re-cert  Abnormal posture - Plan: PT plan of care cert/re-cert  Other abnormalities of gait and mobility - Plan: PT plan of care cert/re-cert  G-Codes - 96/75/91 1738    Functional Assessment Tool Used (Outpatient Only)  Gait velocity 1.5 ft/sec (<1.8 indicates high fall risk); TUG 24.69 sec (>13.5 sec indicates high fall risk)    Functional Limitation  Mobility: Walking and moving around    Mobility: Walking and Moving Around  Current Status [phone])  At least 40 percent but less than 60 percent impaired, limited or restricted    Mobility: Walking and Moving Around Goal Status [phone])  At least 20 percent but less than 40 percent impaired, limited or restricted        Problem List Patient Active Problem List   Diagnosis Date Noted  . Essential hypertension 02/11/2017  . Acute on chronic systolic CHF (congestive heart failure) ([place]) 02/01/2017  . HLD (hyperlipidemia) 02/01/2017  . Atrial fibrillation with RVR ([place])   . Chest pain, rule out acute myocardial infarction 07/18/2016  . Acute blood loss anemia 03/12/2013  . CAD (coronary artery disease)   . Benign neoplasm of colon 02/23/2013  . Nonspecific abnormal finding in stool contents 02/23/2013  . Near syncope 02/21/2013  . Anemia 02/21/2013  . Acute on chronic renal failure ([place]) 02/21/2013  . Chronic systolic congestive heart failure ([place]) 02/21/2013  . GI bleed 02/21/2013  . Chronic anticoagulation 02/21/2013  . Coronary artery disease with history of myocardial infarction without history of CABG 02/21/2013  . Resting chest pain 10/27/2012  . Hypothyroidism 10/15/2012  . Atrial fibrillation ([place]) 10/15/2012  . CKD (chronic kidney disease), stage III ([place]) 10/15/2012  . UTI (urinary tract infection) 10/15/2012  . Dilated cardiomyopathy ([place])   . Aortic insufficiency   . PAC (premature atrial contraction)   . Chest pain 04/02/2010    Rexanne Mano, PT 04/01/2017, 5:41 PM  [facility] [address] [place], [place], [address] Phone: [phone]   Fax:  [phone]  Name: AFRAH BURLISON MRN: [id number] Date of Birth: 10-09-1920

## 2017-04-08 ENCOUNTER — Encounter: Payer: Self-pay | Admitting: Physical Therapy

## 2017-04-08 ENCOUNTER — Ambulatory Visit: Payer: Medicare Other | Attending: Cardiology | Admitting: Physical Therapy

## 2017-04-08 DIAGNOSIS — R2689 Other abnormalities of gait and mobility: Secondary | ICD-10-CM | POA: Insufficient documentation

## 2017-04-08 DIAGNOSIS — M6281 Muscle weakness (generalized): Secondary | ICD-10-CM | POA: Insufficient documentation

## 2017-04-08 DIAGNOSIS — R293 Abnormal posture: Secondary | ICD-10-CM | POA: Diagnosis not present

## 2017-04-08 DIAGNOSIS — R2681 Unsteadiness on feet: Secondary | ICD-10-CM | POA: Diagnosis not present

## 2017-04-08 NOTE — Therapy (Signed)
[facility] [address] [place], [place], [address] Phone: [phone]   Fax:  [phone]  Physical Therapy Treatment  Patient Details  Name: Julie Lara MRN: [id number] Date of Birth: 1920-12-25 Referring Provider: Martinique, Peter MD   Encounter Date: 04/08/2017  PT End of Session - 04/08/17 1442    Visit Number  2    Number of Visits  9    Date for PT Re-Evaluation  05/28/17    Authorization Type  Medicare    Authorization Time Period  03/29/17-05/28/17    PT Start Time  1400    PT Stop Time  1441    PT Time Calculation (min)  41 min    Equipment Utilized During Treatment  Gait belt    Activity Tolerance  Patient limited by pain    Behavior During Therapy  [facility] for tasks assessed/performed       Past Medical History:  Diagnosis Date  . Acute blood loss anemia    November 2014  . Aortic insufficiency    mild to moderate  . Arthritis    "right upper arm" (02/01/2017)  . Atrial fibrillation ([place])   . CAD (coronary artery disease)   . Chest pain 04-2010   atypical with negative echocardiogram  . Dilated cardiomyopathy ([place])   . Goiter   . Hypothyroidism   . Idiopathic cardiomyopathy ([place]) 1999   EF 40% by Echo on 10/13/12  . NSTEMI (non-ST elevated myocardial infarction) [facility]) July 2014   95% prox RCA s/p BMS  . PAC (premature atrial contraction)    Chronic    Past Surgical History:  Procedure Laterality Date  . biopsy of sacral lesion  2010  . CARDIAC CATHETERIZATION    . COLONOSCOPY N/A 02/23/2013   Procedure: COLONOSCOPY;  Surgeon: Ladene Artist, MD;  Location: [facility] ENDOSCOPY;  Service: Endoscopy;  Laterality: N/A;  . CORONARY ANGIOPLASTY WITH STENT PLACEMENT  09/2012   BMS-95% prox RCA  . ESOPHAGOGASTRODUODENOSCOPY N/A 02/22/2013   Procedure: ESOPHAGOGASTRODUODENOSCOPY (EGD);  Surgeon: Milus Banister, MD;  Location: [facility];  Service: Endoscopy;  Laterality: N/A;  . LEFT HEART  CATHETERIZATION WITH CORONARY ANGIOGRAM N/A 10/14/2012   Procedure: LEFT HEART CATHETERIZATION WITH CORONARY ANGIOGRAM;  Surgeon: Wellington Hampshire, MD;  Location: [facility] CATH LAB;  Service: Cardiovascular;  Laterality: N/A;  . PERCUTANEOUS CORONARY STENT INTERVENTION (PCI-S) Right 10/14/2012   Procedure: PERCUTANEOUS CORONARY STENT INTERVENTION (PCI-S);  Surgeon: Wellington Hampshire, MD;  Location: [facility] CATH LAB;  Service: Cardiovascular;  Laterality: Right;  . THYROIDECTOMY  2008  . VAGINAL HYSTERECTOMY     Complete    There were no vitals filed for this visit.  Subjective Assessment - 04/08/17 1403    Subjective  doing well; no recent falls.  reports she fell on black ice ~ 3 years ago and that's why she has trouble with her back.    Patient Stated Goals  I guess I want to get stronger    Currently in Pain?  No/denies         [facility] PT Assessment - 04/08/17 1404      6 Minute Walk- Baseline   6 Minute Walk- Baseline  yes    BP (mmHg)  110/68    HR (bpm)  84    Modified Borg Scale for Dyspnea  0- Nothing at all    Perceived Rate of Exertion (Borg)  6-      6 Minute walk- Post Test   6 Minute  Walk Post Test  yes    BP (mmHg)  114/73    HR (bpm)  91    Modified Borg Scale for Dyspnea  2- Mild shortness of breath    Perceived Rate of Exertion (Borg)  13- Somewhat hard      6 minute walk test results    Aerobic Endurance Distance Walked  525                  [facility] Adult PT Treatment/Exercise - 04/08/17 0001      Ambulation/Gait   Gait Comments  amb into clinic pt needed min A when putting coat down due to LOB forward. otherwise supervision during 6MWT      Exercises   Exercises  Knee/Hip      Knee/Hip Exercises: Seated   Long Arc Quad  Both;15 reps    Long Arc Quad Limitations  3 sec hold    Other Seated Knee/Hip Exercises  heel/toe raises x 15 seconds    Marching  Both;15 reps    Marching Limitations  3 sec hold             PT Education - 04/08/17 1441     Education provided  Yes    Education Details  results of 6MWT, HEP    Person(s) Educated  Patient    Methods  Explanation;Handout;Demonstration;Verbal cues    Comprehension  Verbalized understanding;Returned demonstration;Need further instruction;Verbal cues required       PT Short Term Goals - 04/08/17 1442      PT SHORT TERM GOAL #1   Title  Complete 6 MWT and set LTG as appropriate.    Status  Achieved        PT Long Term Goals - 04/08/17 1442      PT LONG TERM GOAL #1   Title  Pt will perform home exercises for balance and strengthening with mod I using paper handout . Target date for STGs: 04/28/17    Status  On-going    Target Date  04/28/17      PT LONG TERM GOAL #2   Title  Patient will improve normal TUG to <19 seconds to indicate progress towards lower fall risk.     Status  On-going      PT LONG TERM GOAL #3   Title  Patient will improve gait velocity to >=1.8 ft/sec to indicate lesser fall risk.      Status  On-going      PT LONG TERM GOAL #4   Title  Patient will ambulate >= 300 ft with LRAD modified independent over level indoor surfaces.     Status  On-going      PT LONG TERM GOAL #5   Title  improve 6 MWT to at least 675' for improved endurance and mobility    Status  New            Plan - 04/08/17 1443    Clinical Impression Statement  Pt amb 525' during 6MWT with mild change in BP following test and 2/4 DOE with test.  Tolerated session well but needs frequent cues to attend to task and increased time needed due to inability to get O2 sats.  Will continue to benefit from PT to maximize function.    PT Treatment/Interventions  ADLs/Self Care Home Management;DME Instruction;Gait training;Balance training;Therapeutic exercise;Therapeutic activities;Functional mobility training;Patient/family education;Passive range of motion;Energy conservation    PT Next Visit Plan  review and add to HEP for LE strength and balance;  Consulted and Agree with Plan  of Care  Patient       Patient will benefit from skilled therapeutic intervention in order to improve the following deficits and impairments:  Abnormal gait, Cardiopulmonary status limiting activity, Decreased activity tolerance, Decreased balance, Decreased mobility, Decreased knowledge of use of DME, Decreased endurance, Decreased strength, Increased edema, Postural dysfunction  Visit Diagnosis: Muscle weakness (generalized)  Unsteadiness on feet  Abnormal posture  Other abnormalities of gait and mobility     Problem List Patient Active Problem List   Diagnosis Date Noted  . Essential hypertension 02/11/2017  . Acute on chronic systolic CHF (congestive heart failure) ([place]) 02/01/2017  . HLD (hyperlipidemia) 02/01/2017  . Atrial fibrillation with RVR ([place])   . Chest pain, rule out acute myocardial infarction 07/18/2016  . Acute blood loss anemia 03/12/2013  . CAD (coronary artery disease)   . Benign neoplasm of colon 02/23/2013  . Nonspecific abnormal finding in stool contents 02/23/2013  . Near syncope 02/21/2013  . Anemia 02/21/2013  . Acute on chronic renal failure ([place]) 02/21/2013  . Chronic systolic congestive heart failure ([place]) 02/21/2013  . GI bleed 02/21/2013  . Chronic anticoagulation 02/21/2013  . Coronary artery disease with history of myocardial infarction without history of CABG 02/21/2013  . Resting chest pain 10/27/2012  . Hypothyroidism 10/15/2012  . Atrial fibrillation ([place]) 10/15/2012  . CKD (chronic kidney disease), stage III ([place]) 10/15/2012  . UTI (urinary tract infection) 10/15/2012  . Dilated cardiomyopathy ([place])   . Aortic insufficiency   . PAC (premature atrial contraction)   . Chest pain 04/02/2010      Laureen Abrahams, PT, DPT 04/08/17 2:46 PM    [facility] [address] [place], [place], [address] Phone: [phone]   Fax:  [phone]  Name: Julie Lara MRN: [id number] Date of Birth: March 03, 1921

## 2017-04-08 NOTE — Patient Instructions (Signed)
Sitting Chair Flexion    Bring knee up toward chest. Hold for 3 seconds.  Repeat with other leg. Repeat _15___ times on each leg. Do __1-2__ sessions per day.  Long [employer]    Straighten right leg and try to hold it _3___ seconds. Repeat on left leg. Repeat _15___ times on each leg. Do __1-2__ sessions a day.  Toe / Heel Raise (Sitting)    Sitting, raise heels, then rock back on heels and raise toes. Repeat _15___ times. Do 1-2 sessions per day.

## 2017-04-10 ENCOUNTER — Ambulatory Visit: Payer: Medicare Other | Admitting: Physical Therapy

## 2017-04-10 ENCOUNTER — Encounter: Payer: Self-pay | Admitting: Physical Therapy

## 2017-04-10 DIAGNOSIS — R2681 Unsteadiness on feet: Secondary | ICD-10-CM

## 2017-04-10 DIAGNOSIS — M6281 Muscle weakness (generalized): Secondary | ICD-10-CM

## 2017-04-10 DIAGNOSIS — R293 Abnormal posture: Secondary | ICD-10-CM | POA: Diagnosis not present

## 2017-04-10 DIAGNOSIS — R2689 Other abnormalities of gait and mobility: Secondary | ICD-10-CM

## 2017-04-10 NOTE — Therapy (Signed)
[facility] [address] [place], [place], [address] Phone: [phone]   Fax:  [phone]  Physical Therapy Treatment  Patient Details  Name: Julie Lara MRN: [id number] Date of Birth: 03-27-1921 Referring Provider: [place], Peter MD   Encounter Date: 04/10/2017  PT End of Session - 04/10/17 1433    Visit Number  3    Number of Visits  9    Date for PT Re-Evaluation  05/28/17    Authorization Type  Medicare    Authorization Time Period  03/29/17-05/28/17    PT Start Time  1355    PT Stop Time  1439    PT Time Calculation (min)  44 min    Equipment Utilized During Treatment  Gait belt    Activity Tolerance  Patient tolerated treatment well    Behavior During Therapy  [facility] for tasks assessed/performed       Past Medical History:  Diagnosis Date  . Acute blood loss anemia    November 2014  . Aortic insufficiency    mild to moderate  . Arthritis    "right upper arm" (02/01/2017)  . Atrial fibrillation ([place])   . CAD (coronary artery disease)   . Chest pain 04-2010   atypical with negative echocardiogram  . Dilated cardiomyopathy ([place])   . Goiter   . Hypothyroidism   . Idiopathic cardiomyopathy ([place]) 1999   EF 40% by Echo on 10/13/12  . NSTEMI (non-ST elevated myocardial infarction) [facility]) July 2014   95% prox RCA s/p BMS  . PAC (premature atrial contraction)    Chronic    Past Surgical History:  Procedure Laterality Date  . biopsy of sacral lesion  2010  . CARDIAC CATHETERIZATION    . COLONOSCOPY N/A 02/23/2013   Procedure: COLONOSCOPY;  Surgeon: Ladene Artist, MD;  Location: [facility] ENDOSCOPY;  Service: Endoscopy;  Laterality: N/A;  . CORONARY ANGIOPLASTY WITH STENT PLACEMENT  09/2012   BMS-95% prox RCA  . ESOPHAGOGASTRODUODENOSCOPY N/A 02/22/2013   Procedure: ESOPHAGOGASTRODUODENOSCOPY (EGD);  Surgeon: Milus Banister, MD;  Location: [facility];  Service: Endoscopy;  Laterality: N/A;  . LEFT HEART  CATHETERIZATION WITH CORONARY ANGIOGRAM N/A 10/14/2012   Procedure: LEFT HEART CATHETERIZATION WITH CORONARY ANGIOGRAM;  Surgeon: Wellington Hampshire, MD;  Location: [place] [facility] CATH LAB;  Service: Cardiovascular;  Laterality: N/A;  . PERCUTANEOUS CORONARY STENT INTERVENTION (PCI-S) Right 10/14/2012   Procedure: PERCUTANEOUS CORONARY STENT INTERVENTION (PCI-S);  Surgeon: Wellington Hampshire, MD;  Location: [facility] CATH LAB;  Service: Cardiovascular;  Laterality: Right;  . THYROIDECTOMY  2008  . VAGINAL HYSTERECTOMY     Complete    There were no vitals filed for this visit.  Subjective Assessment - 04/10/17 1357    Subjective  [place] sidewalk[place]  reports she got tired but did well.  too tired to do her exercises.     Patient Stated Goals  I guess I want to get stronger    Currently in Pain?  No/denies                      [facility] Adult PT Treatment/Exercise - 04/10/17 1401      Ambulation/Gait   Ambulation/Gait Assistance  4: Min assist    Ambulation Distance (Feet)  360 Feet    Assistive device  Straight cane    Gait Pattern  Step-through pattern    Gait Comments  amb with SPC and 1  UE support x 240' with focus on increased step length and heel strike progressing to min A and no additional UE support with improved step length and heel strike; significantly decreased speed initially with slowly improving speed      Knee/Hip Exercises: Aerobic   Nustep  L4 x 8 min      Knee/Hip Exercises: Standing   Heel Raises  Both;20 reps    Heel Raises Limitations  toe raises x 20     Hip Flexion  Both;15 reps;Knee bent    Hip Abduction  Both;15 reps;Knee straight    Hip Extension  Both;15 reps;Knee straight      Knee/Hip Exercises: Seated   Long Arc Quad  Both;15 reps    Long Arc Quad Limitations  3 sec hold    Other Seated Knee/Hip Exercises  heel/toe raises x 15 seconds    Marching  Both;15 reps    Marching Limitations  3 sec hold                PT Short Term Goals - 04/08/17 1442      PT SHORT TERM GOAL #1   Title  Complete 6 MWT and set LTG as appropriate.    Status  Achieved        PT Long Term Goals - 04/08/17 1442      PT LONG TERM GOAL #1   Title  Pt will perform home exercises for balance and strengthening with mod I using paper handout . Target date for STGs: 04/28/17    Status  On-going    Target Date  04/28/17      PT LONG TERM GOAL #2   Title  Patient will improve normal TUG to <19 seconds to indicate progress towards lower fall risk.     Status  On-going      PT LONG TERM GOAL #3   Title  Patient will improve gait velocity to >=1.8 ft/sec to indicate lesser fall risk.      Status  On-going      PT LONG TERM GOAL #4   Title  Patient will ambulate >= 300 ft with LRAD modified independent over level indoor surfaces.     Status  On-going      PT LONG TERM GOAL #5   Title  improve 6 MWT to at least 675' for improved endurance and mobility    Status  New            Plan - 04/10/17 1434    Clinical Impression Statement  Pt tolerated session well today and added standing exercises to HEP.  Gait improves with cues and will continue to work on heel strike and step length.  Progressing well towards goals.     PT Treatment/Interventions  ADLs/Self Care Home Management;DME Instruction;Gait training;Balance training;Therapeutic exercise;Therapeutic activities;Functional mobility training;Patient/family education;Passive range of motion;Energy conservation    PT Next Visit Plan  review standing HEP; LE strength and balance;     Consulted and Agree with Plan of Care  Patient       Patient will benefit from skilled therapeutic intervention in order to improve the following deficits and impairments:  Abnormal gait, Cardiopulmonary status limiting activity, Decreased activity tolerance, Decreased balance, Decreased mobility, Decreased knowledge of use of DME, Decreased endurance, Decreased  strength, Increased edema, Postural dysfunction  Visit Diagnosis: Muscle weakness (generalized)  Unsteadiness on feet  Abnormal posture  Other abnormalities of gait and mobility     Problem List Patient Active Problem List  Diagnosis Date Noted  . Essential hypertension 02/11/2017  . Acute on chronic systolic CHF (congestive heart failure) ([place]) 02/01/2017  . HLD (hyperlipidemia) 02/01/2017  . Atrial fibrillation with RVR ([place])   . Chest pain, rule out acute myocardial infarction 07/18/2016  . Acute blood loss anemia 03/12/2013  . CAD (coronary artery disease)   . Benign neoplasm of colon 02/23/2013  . Nonspecific abnormal finding in stool contents 02/23/2013  . Near syncope 02/21/2013  . Anemia 02/21/2013  . Acute on chronic renal failure ([place]) 02/21/2013  . Chronic systolic congestive heart failure ([place]) 02/21/2013  . GI bleed 02/21/2013  . Chronic anticoagulation 02/21/2013  . Coronary artery disease with history of myocardial infarction without history of CABG 02/21/2013  . Resting chest pain 10/27/2012  . Hypothyroidism 10/15/2012  . Atrial fibrillation ([place]) 10/15/2012  . CKD (chronic kidney disease), stage III ([place]) 10/15/2012  . UTI (urinary tract infection) 10/15/2012  . Dilated cardiomyopathy ([place])   . Aortic insufficiency   . PAC (premature atrial contraction)   . Chest pain 04/02/2010      Laureen Abrahams, PT, DPT 04/10/17 2:36 PM   [facility] [address] [place], [place], [address] Phone: [phone]   Fax:  [phone]  Name: Julie Lara MRN: [id number] Date of Birth: [birth year]

## 2017-04-10 NOTE — Patient Instructions (Signed)
Standing Marching   Using a chair if necessary, march in place. Repeat __15__ times. Do __1-2__ sessions per day.    Hip Backward Kick   Using a chair for balance, keep legs shoulder width apart and toes pointed for- ward. Slowly extend one leg back, keeping knee straight. Do not lean forward. Repeat with other leg. Repeat _15___ times. Do _1-2___ sessions per day.    Hip Side Kick   Holding a chair for balance, keep legs shoulder width apart and toes pointed forward. Swing a leg out to side, keeping knee straight. Do not lean. Repeat using other leg. Repeat _15___ times. Do __1-2__ sessions per day.   Toe / Heel Raise (Standing)    Standing with support, raise heels, then rock back on heels and raise toes. Repeat __20__ times.

## 2017-04-12 ENCOUNTER — Other Ambulatory Visit: Payer: Self-pay | Admitting: *Deleted

## 2017-04-12 NOTE — Patient Outreach (Addendum)
[facility]) Care Management  04/12/2017  Julie Lara Dec 14, 1920 [id number]   Weekly transition of care call placed to member.  She report she is doing well, denies shortness of breath or chest discomfort.  State she has been taking medications as prescribed, report weight today at 119 pounds (unsure of validity as daughter reported weight last week to be 130 pounds).  She has been active with outpatient rehab since 12/28, has 6 more sessions scheduled.  She is aware that transition of care program complete as she has been out of hospital for 31 days.  Denies any concerns at this time, reminded of home visit scheduled for next week, will assess weight log at that time.    THN CM Care Plan Problem One     Most Recent Value  Care Plan Problem One  Risk for readmission related to heart failure management as evidenced by recent hospitalization  Role Documenting the Problem One  Care Management [place] for Problem One  Not Active  [facility] Long Term Goal   Member will not be readmitted to hospital within 31 days of discharge  [facility] Long Term Goal Start Date  03/11/17  [facility] Long Term Goal Met Date  04/12/17  Interventions for Problem One Long Term Goal  Weight management reviewed with second daughter, importance of continuing daily weights reviewed.  Educated on signs/symptoms of heart failure exacerbation.  THN CM Short Term Goal #1   Member will weigh self and record readings daily over the next 4 weeks  THN CM Short Term Goal #1 Start Date  03/11/17  [facility] CM Short Term Goal #1 Met Date  04/01/17  Interventions for Short Term Goal #1  Member and daughter educated on interventions to help keep weight stable and decrease fluid levels (such as exercise/walking, medication adherence, fluid restriction, and elevating legs)  THN CM Short Term Goal #2   Member will have follow up appointment with primary and/or cardiologist within the next 2 weeks  THN CM Short Term Goal #2  Start Date  03/11/17  [facility] CM Short Term Goal #2 Met Date  03/19/17  Interventions for Short Term Goal #2  Appointment canceled today due to weather, confirmed with daughter rescheduled for later this week.     Valente David, [place], MSN [place] [phone]

## 2017-04-15 ENCOUNTER — Encounter: Payer: Self-pay | Admitting: Physical Therapy

## 2017-04-15 ENCOUNTER — Ambulatory Visit: Payer: Medicare Other | Admitting: Physical Therapy

## 2017-04-15 DIAGNOSIS — R2689 Other abnormalities of gait and mobility: Secondary | ICD-10-CM

## 2017-04-15 DIAGNOSIS — R2681 Unsteadiness on feet: Secondary | ICD-10-CM

## 2017-04-15 DIAGNOSIS — M6281 Muscle weakness (generalized): Secondary | ICD-10-CM

## 2017-04-15 DIAGNOSIS — R293 Abnormal posture: Secondary | ICD-10-CM

## 2017-04-15 NOTE — Patient Instructions (Signed)

## 2017-04-15 NOTE — Therapy (Signed)
[facility] [address] [place], [place], [address] Phone: [phone]   Fax:  [phone]  Physical Therapy Treatment  Patient Details  Name: Julie Lara MRN: [id number] Date of Birth: 1920-09-08 Referring Provider: [place], Peter MD   Encounter Date: 04/15/2017  PT End of Session - 04/15/17 1446    Visit Number  4    Number of Visits  9    Date for PT Re-Evaluation  05/28/17    Authorization Type  Medicare    Authorization Time Period  03/29/17-05/28/17    PT Start Time  1400    PT Stop Time  1440    PT Time Calculation (min)  40 min    Equipment Utilized During Treatment  Gait belt    Activity Tolerance  Patient tolerated treatment well    Behavior During Therapy  [facility] for tasks assessed/performed       Past Medical History:  Diagnosis Date  . Acute blood loss anemia    November 2014  . Aortic insufficiency    mild to moderate  . Arthritis    "right upper arm" (02/01/2017)  . Atrial fibrillation ([place])   . CAD (coronary artery disease)   . Chest pain 04-2010   atypical with negative echocardiogram  . Dilated cardiomyopathy ([place])   . Goiter   . Hypothyroidism   . Idiopathic cardiomyopathy ([place]) 1999   EF 40% by Echo on 10/13/12  . NSTEMI (non-ST elevated myocardial infarction) [facility]) July 2014   95% prox RCA s/p BMS  . PAC (premature atrial contraction)    Chronic    Past Surgical History:  Procedure Laterality Date  . biopsy of sacral lesion  2010  . CARDIAC CATHETERIZATION    . COLONOSCOPY N/A 02/23/2013   Procedure: COLONOSCOPY;  Surgeon: Ladene Artist, MD;  Location: [facility] ENDOSCOPY;  Service: Endoscopy;  Laterality: N/A;  . CORONARY ANGIOPLASTY WITH STENT PLACEMENT  09/2012   BMS-95% prox RCA  . ESOPHAGOGASTRODUODENOSCOPY N/A 02/22/2013   Procedure: ESOPHAGOGASTRODUODENOSCOPY (EGD);  Surgeon: Milus Banister, MD;  Location: [place];  Service: Endoscopy;  Laterality: N/A;  . LEFT HEART  CATHETERIZATION WITH CORONARY ANGIOGRAM N/A 10/14/2012   Procedure: LEFT HEART CATHETERIZATION WITH CORONARY ANGIOGRAM;  Surgeon: Wellington Hampshire, MD;  Location: [facility] CATH LAB;  Service: Cardiovascular;  Laterality: N/A;  . PERCUTANEOUS CORONARY STENT INTERVENTION (PCI-S) Right 10/14/2012   Procedure: PERCUTANEOUS CORONARY STENT INTERVENTION (PCI-S);  Surgeon: Wellington Hampshire, MD;  Location: [facility] CATH LAB;  Service: Cardiovascular;  Laterality: Right;  . THYROIDECTOMY  2008  . VAGINAL HYSTERECTOMY     Complete    There were no vitals filed for this visit.  Subjective Assessment - 04/15/17 1403    Subjective  "I haven't been doing my exercises like I should." nothing new to report    Pertinent History  coronary artery disease, chronic systolic CHF, atrial fibrillation not anticoagulated, and hypothyroidism    Patient Stated Goals  I guess I want to get stronger    Currently in Pain?  No/denies                      [facility] Adult PT Treatment/Exercise - 04/15/17 1409      Ambulation/Gait   Ambulation/Gait Assistance  4: Min guard;5: Supervision    Ambulation Distance (Feet)  360 Feet    Assistive device  Straight cane    Gait Pattern  Step-through pattern    Gait Comments  amb with  SPC and 1 UE support x 240' with focus on increased step length and heel strike progressing to min A and no additional UE support with improved step length and heel strike; significantly decreased speed initially with slowly improving speed      Self-Care   Self-Care  Other Self-Care Comments    Other Self-Care Comments   educated on fall prevention strategies with pt/daughter      Knee/Hip Exercises: Standing   Heel Raises  Both;20 reps    Heel Raises Limitations  toe raises x 20     Hip Flexion  Both;15 reps;Knee bent    Hip Abduction  Both;15 reps;Knee straight    Hip Extension  Both;15 reps;Knee straight             PT Education - 04/15/17 1450    Education provided  Yes     Education Details  fall prevention    Person(s) Educated  Patient;Child(ren)    Methods  Explanation;Handout    Comprehension  Verbalized understanding       PT Short Term Goals - 04/08/17 1442      PT SHORT TERM GOAL #1   Title  Complete 6 MWT and set LTG as appropriate.    Status  Achieved        PT Long Term Goals - 04/08/17 1442      PT LONG TERM GOAL #1   Title  Pt will perform home exercises for balance and strengthening with mod I using paper handout . Target date for STGs: 04/28/17    Status  On-going    Target Date  04/28/17      PT LONG TERM GOAL #2   Title  Patient will improve normal TUG to <19 seconds to indicate progress towards lower fall risk.     Status  On-going      PT LONG TERM GOAL #3   Title  Patient will improve gait velocity to >=1.8 ft/sec to indicate lesser fall risk.      Status  On-going      PT LONG TERM GOAL #4   Title  Patient will ambulate >= 300 ft with LRAD modified independent over level indoor surfaces.     Status  On-going      PT LONG TERM GOAL #5   Title  improve 6 MWT to at least 675' for improved endurance and mobility    Status  New            Plan - 04/15/17 1448    Clinical Impression Statement  Session today focused on review of standing HEP and continued gait training.  Pt with increased shuffling when conversing with PT and distracted from gait needing increased cues to improve heel strike and step length.  Also educated on fall prevention strategies today and reinforced need for emergency response system when home alone and no socks unless they have grips on the bottom.  Pt verbalized understanding.    PT Treatment/Interventions  ADLs/Self Care Home Management;DME Instruction;Gait training;Balance training;Therapeutic exercise;Therapeutic activities;Functional mobility training;Patient/family education;Passive range of motion;Energy conservation    PT Next Visit Plan  LE strength and balance; gait training    Consulted  and Agree with Plan of Care  Patient    Family Member Consulted  daughter       Patient will benefit from skilled therapeutic intervention in order to improve the following deficits and impairments:  Abnormal gait, Cardiopulmonary status limiting activity, Decreased activity tolerance, Decreased balance, Decreased mobility, Decreased knowledge  of use of DME, Decreased endurance, Decreased strength, Increased edema, Postural dysfunction  Visit Diagnosis: Muscle weakness (generalized)  Unsteadiness on feet  Abnormal posture  Other abnormalities of gait and mobility     Problem List Patient Active Problem List   Diagnosis Date Noted  . Essential hypertension 02/11/2017  . Acute on chronic systolic CHF (congestive heart failure) ([place]) 02/01/2017  . HLD (hyperlipidemia) 02/01/2017  . Atrial fibrillation with RVR ([place])   . Chest pain, rule out acute myocardial infarction 07/18/2016  . Acute blood loss anemia 03/12/2013  . CAD (coronary artery disease)   . Benign neoplasm of colon 02/23/2013  . Nonspecific abnormal finding in stool contents 02/23/2013  . Near syncope 02/21/2013  . Anemia 02/21/2013  . Acute on chronic renal failure ([place]) 02/21/2013  . Chronic systolic congestive heart failure ([place]) 02/21/2013  . GI bleed 02/21/2013  . Chronic anticoagulation 02/21/2013  . Coronary artery disease with history of myocardial infarction without history of CABG 02/21/2013  . Resting chest pain 10/27/2012  . Hypothyroidism 10/15/2012  . Atrial fibrillation ([place]) 10/15/2012  . CKD (chronic kidney disease), stage III ([place]) 10/15/2012  . UTI (urinary tract infection) 10/15/2012  . Dilated cardiomyopathy ([place])   . Aortic insufficiency   . PAC (premature atrial contraction)   . Chest pain 04/02/2010      Laureen Abrahams, PT, DPT 04/15/17 2:51 PM    [facility] [address] [place], [place], [address] Phone:  [phone]   Fax:  [phone]  Name: Julie Lara MRN: [id number] Date of Birth: September 10, 1920

## 2017-04-16 ENCOUNTER — Other Ambulatory Visit: Payer: Self-pay | Admitting: *Deleted

## 2017-04-16 NOTE — Patient Outreach (Signed)
[facility]) Care Management   04/16/2017  Julie Lara [birth year] [id number]  Julie Lara is an 84 y.o. female  Subjective:   Member alert and oriented x3, denies shortness of breath or chest discomfort.  Report compliance with medications and daily weights.    Objective:   Review of Systems  Constitutional: Negative.   HENT: Negative.   Eyes: Negative.   Respiratory: Negative.   Cardiovascular: Negative.   Gastrointestinal: Negative.   Genitourinary: Negative.   Musculoskeletal: Negative.   Skin: Negative.   Neurological: Negative.   Endo/Heme/Allergies: Negative.   Psychiatric/Behavioral: Negative.     Physical Exam  Constitutional: She is oriented to person, place, and time. She appears well-developed and well-nourished.  Neck: Normal range of motion.  Cardiovascular:  Irregular  Respiratory: Effort normal and breath sounds normal.  GI: Soft. Bowel sounds are normal.  Musculoskeletal: Normal range of motion.  Neurological: She is alert and oriented to person, place, and time.  Skin: Skin is warm and dry.   BP 122/70 (BP Location: Left Arm, Patient Position: Sitting, Cuff Size: Normal)   Pulse 65   Resp 20   Ht 1.549 m (5' 1" )   Wt 127 lb (57.6 kg)   SpO2 96%   BMI 24.00 kg/m   Encounter Medications:   Outpatient Encounter Medications as of 04/16/2017  Medication Sig Note  . aspirin EC 81 MG tablet Take 81 mg by mouth daily.   Marland Kitchen atorvastatin (LIPITOR) 40 MG tablet Take 1 tablet (40 mg total) by mouth daily at 6 PM.   . Cholecalciferol (VITAMIN D PO) Take 1,000 Units by mouth daily.    . Cyanocobalamin (VITAMIN B-12 PO) Take 1 tablet by mouth daily.   . furosemide (LASIX) 40 MG tablet Take 1 tablet (40 mg total) by mouth daily. 04/16/2017: 2 tablets, total of 40 mg daily   . iron polysaccharides (FERREX 150) 150 MG capsule Take 150 mg by mouth 2 (two) times daily.    Marland Kitchen levothyroxine (SYNTHROID, LEVOTHROID) 25 MCG tablet Take 25 mcg by  mouth daily before breakfast.   . metoprolol tartrate (LOPRESSOR) 25 MG tablet Take 1 tablet (25 mg total) 2 (two) times daily by mouth.   . nitroGLYCERIN (NITROSTAT) 0.4 MG SL tablet Place 1 tablet (0.4 mg total) under the tongue every 5 (five) minutes as needed for chest pain.   . Nutritional Supplements (FEEDING SUPPLEMENT, NEPRO CARB STEADY,) LIQD Take 237 mLs 2 (two) times daily between meals by mouth. (Patient not taking: Reported on 04/16/2017)   . pantoprazole (PROTONIX) 40 MG tablet Take 1 tablet (40 mg total) by mouth daily at 6 (six) AM. (Patient not taking: Reported on 04/16/2017)    No facility-administered encounter medications on file as of 04/16/2017.     Functional Status:   In your present state of health, do you have any difficulty performing the following activities: 03/08/2017 03/08/2017  Hearing? - Y  Vision? - N  Comment - -  Difficulty concentrating or making decisions? - N  Walking or climbing stairs? - Y  Comment - -  Dressing or bathing? - Y  Doing errands, shopping? [place] and eating ? - -  Using the Toilet? - -  In the past six months, have you accidently leaked urine? - -  Do you have problems with loss of bowel control? - -  Managing your Medications? - -  Managing your Finances? - -  Housekeeping or  managing your Housekeeping? - -  Some recent data might be hidden    Fall/Depression Screening:    Fall Risk  02/18/2017  Falls in the past year? No   PHQ 2/9 Scores 02/18/2017 11/19/2012  PHQ - 2 Score 0 0    Assessment:    Met with member at scheduled time.  Daughter present during visit.  Weight log reviewed as member reported last week weight being 10 pounds less than usual (119 pounds).  Over the last month, weight has been steady between 127 and 129 pounds, only 1 reading at 131.  She report she has continued to watch her sodium intake in effort to manage her heart failure.    Member is excited about working with  outpatient rehab to increase strength.  Reviewed plan of care with member and daughter.  Next appointment with primary MD is 3/21.  Follow up with GI on 2/5.  Denies any concerns at this time.  Advised to contact with questions.  Plan:   Will follow up next month with routine home visit.  THN CM Care Plan Problem One     Most Recent Value  Care Plan Problem One  Risk for fall related to deconditioning as evidenced by report of decreased strength  Role Documenting the Problem One  Care Management [place] for Problem One  Active  [facility] Long Term Goal   Member will not have any falls within the next 31 days  THN Long Term Goal Start Date  04/16/17  Interventions for Problem One Long Term Goal  Member and daughter educated on the importance of increasing strength and mobility in decreasing risk for fall.  Advised of importance of not only attending outpatient sessions, but also performing home exercises.  THN CM Short Term Goal #1   Member will report attending all outpatient rehab sessions over the next 4 years  THN CM Short Term Goal #1 Start Date  04/16/17  Interventions for Short Term Goal #1  Reviewed upcoming appointments with member and daughter.  Confirmed member has transportation to appointments.  THN CM Short Term Goal #2   Member will perform home exercises at least once a day over the next 4 weeks  THN CM Short Term Goal #2 Start Date  04/16/17  Interventions for Short Term Goal #2  Home exercises reviewed with member and daughter, demonstrations provided.     Valente David, [place], MSN [place] [phone]

## 2017-04-17 ENCOUNTER — Ambulatory Visit: Payer: Medicare Other | Admitting: Physical Therapy

## 2017-04-17 ENCOUNTER — Encounter: Payer: Self-pay | Admitting: Physical Therapy

## 2017-04-17 DIAGNOSIS — R293 Abnormal posture: Secondary | ICD-10-CM

## 2017-04-17 DIAGNOSIS — R2681 Unsteadiness on feet: Secondary | ICD-10-CM | POA: Diagnosis not present

## 2017-04-17 DIAGNOSIS — R2689 Other abnormalities of gait and mobility: Secondary | ICD-10-CM

## 2017-04-17 DIAGNOSIS — M6281 Muscle weakness (generalized): Secondary | ICD-10-CM

## 2017-04-17 NOTE — Therapy (Signed)
[facility] [address] [place], [place], [address] Phone: [phone]   Fax:  [phone]  Physical Therapy Treatment  Patient Details  Name: Julie Lara MRN: [id number] Date of Birth: [birth year] Referring Provider: Martinique, Peter MD   Encounter Date: 04/17/2017  PT End of Session - 04/17/17 1437    Visit Number  5    Number of Visits  9    Date for PT Re-Evaluation  05/28/17    Authorization Type  Medicare    Authorization Time Period  03/29/17-05/28/17    PT Start Time  1400    PT Stop Time  1439    PT Time Calculation (min)  39 min    Activity Tolerance  Patient tolerated treatment well    Behavior During Therapy  [facility] for tasks assessed/performed       Past Medical History:  Diagnosis Date  . Acute blood loss anemia    November 2014  . Aortic insufficiency    mild to moderate  . Arthritis    "right upper arm" (02/01/2017)  . Atrial fibrillation ([place])   . CAD (coronary artery disease)   . Chest pain 04-2010   atypical with negative echocardiogram  . Dilated cardiomyopathy ([place])   . Goiter   . Hypothyroidism   . Idiopathic cardiomyopathy ([place]) 1999   EF 40% by Echo on 10/13/12  . NSTEMI (non-ST elevated myocardial infarction) [facility]) July 2014   95% prox RCA s/p BMS  . PAC (premature atrial contraction)    Chronic    Past Surgical History:  Procedure Laterality Date  . biopsy of sacral lesion  2010  . CARDIAC CATHETERIZATION    . COLONOSCOPY N/A 02/23/2013   Procedure: COLONOSCOPY;  Surgeon: Ladene Artist, MD;  Location: [facility] ENDOSCOPY;  Service: Endoscopy;  Laterality: N/A;  . CORONARY ANGIOPLASTY WITH STENT PLACEMENT  09/2012   BMS-95% prox RCA  . ESOPHAGOGASTRODUODENOSCOPY N/A 02/22/2013   Procedure: ESOPHAGOGASTRODUODENOSCOPY (EGD);  Surgeon: Milus Banister, MD;  Location: [place];  Service: Endoscopy;  Laterality: N/A;  . LEFT HEART CATHETERIZATION WITH CORONARY ANGIOGRAM N/A  10/14/2012   Procedure: LEFT HEART CATHETERIZATION WITH CORONARY ANGIOGRAM;  Surgeon: Wellington Hampshire, MD;  Location: [facility] CATH LAB;  Service: Cardiovascular;  Laterality: N/A;  . PERCUTANEOUS CORONARY STENT INTERVENTION (PCI-S) Right 10/14/2012   Procedure: PERCUTANEOUS CORONARY STENT INTERVENTION (PCI-S);  Surgeon: Wellington Hampshire, MD;  Location: [facility] CATH LAB;  Service: Cardiovascular;  Laterality: Right;  . THYROIDECTOMY  2008  . VAGINAL HYSTERECTOMY     Complete    There were no vitals filed for this visit.  Subjective Assessment - 04/17/17 1359    Subjective  did a little exercise yesterday.     Patient Stated Goals  I guess I want to get stronger    Currently in Pain?  No/denies                      [facility] Adult PT Treatment/Exercise - 04/17/17 0001      Knee/Hip Exercises: Aerobic   Nustep  L4 x 5 min      Knee/Hip Exercises: Seated   Long Arc Quad  Both;15 reps    Long Arc Quad Limitations  3 sec hold    Other Seated Knee/Hip Exercises  heel/toe raises x 15 reps    Marching  Both;15 reps    Marching Limitations  3 sec hold      Knee/Hip Exercises: Supine   [employer]  Both;15 reps    Straight Leg Raises  Both;15 reps    Other Supine Knee/Hip Exercises  supine clams with red theraband x 15 reps               PT Short Term Goals - 04/08/17 1442      PT SHORT TERM GOAL #1   Title  Complete 6 MWT and set LTG as appropriate.    Status  Achieved        PT Long Term Goals - 04/08/17 1442      PT LONG TERM GOAL #1   Title  Pt will perform home exercises for balance and strengthening with mod I using paper handout . Target date for STGs: 04/28/17    Status  On-going    Target Date  04/28/17      PT LONG TERM GOAL #2   Title  Patient will improve normal TUG to <19 seconds to indicate progress towards lower fall risk.     Status  On-going      PT LONG TERM GOAL #3   Title  Patient will improve gait velocity to >=1.8 ft/sec to indicate lesser fall  risk.      Status  On-going      PT LONG TERM GOAL #4   Title  Patient will ambulate >= 300 ft with LRAD modified independent over level indoor surfaces.     Status  On-going      PT LONG TERM GOAL #5   Title  improve 6 MWT to at least 675' for improved endurance and mobility    Status  New            Plan - 04/17/17 1437    Clinical Impression Statement  Pt tolerated session well today with focus on strengthening.  Progressing well with PT.    PT Treatment/Interventions  ADLs/Self Care Home Management;DME Instruction;Gait training;Balance training;Therapeutic exercise;Therapeutic activities;Functional mobility training;Patient/family education;Passive range of motion;Energy conservation    PT Next Visit Plan  LE strength and balance; gait training    Consulted and Agree with Plan of Care  Patient       Patient will benefit from skilled therapeutic intervention in order to improve the following deficits and impairments:  Abnormal gait, Cardiopulmonary status limiting activity, Decreased activity tolerance, Decreased balance, Decreased mobility, Decreased knowledge of use of DME, Decreased endurance, Decreased strength, Increased edema, Postural dysfunction  Visit Diagnosis: Muscle weakness (generalized)  Unsteadiness on feet  Abnormal posture  Other abnormalities of gait and mobility     Problem List Patient Active Problem List   Diagnosis Date Noted  . Essential hypertension 02/11/2017  . Acute on chronic systolic CHF (congestive heart failure) ([place]) 02/01/2017  . HLD (hyperlipidemia) 02/01/2017  . Atrial fibrillation with RVR ([place])   . Chest pain, rule out acute myocardial infarction 07/18/2016  . Acute blood loss anemia 03/12/2013  . CAD (coronary artery disease)   . Benign neoplasm of colon 02/23/2013  . Nonspecific abnormal finding in stool contents 02/23/2013  . Near syncope 02/21/2013  . Anemia 02/21/2013  . Acute on chronic renal failure ([place]) 02/21/2013   . Chronic systolic congestive heart failure ([place]) 02/21/2013  . GI bleed 02/21/2013  . Chronic anticoagulation 02/21/2013  . Coronary artery disease with history of myocardial infarction without history of CABG 02/21/2013  . Resting chest pain 10/27/2012  . Hypothyroidism 10/15/2012  . Atrial fibrillation ([place]) 10/15/2012  . CKD (chronic kidney disease), stage III ([place]) 10/15/2012  . UTI (urinary tract  infection) 10/15/2012  . Dilated cardiomyopathy ([place])   . Aortic insufficiency   . PAC (premature atrial contraction)   . Chest pain 04/02/2010      Laureen Abrahams, PT, DPT 04/17/17 2:39 PM    [facility] [address] [place], [place], [address] Phone: [phone]   Fax:  [phone]  Name: ETOY MCDONNELL MRN: [id number] Date of Birth: 1920/06/21

## 2017-04-22 ENCOUNTER — Ambulatory Visit: Payer: Medicare Other | Admitting: Physical Therapy

## 2017-04-22 ENCOUNTER — Encounter: Payer: Self-pay | Admitting: Physical Therapy

## 2017-04-22 VITALS — BP 132/81 | HR 101

## 2017-04-22 DIAGNOSIS — R2681 Unsteadiness on feet: Secondary | ICD-10-CM | POA: Diagnosis not present

## 2017-04-22 DIAGNOSIS — R2689 Other abnormalities of gait and mobility: Secondary | ICD-10-CM | POA: Diagnosis not present

## 2017-04-22 DIAGNOSIS — M799 Soft tissue disorder, unspecified: Secondary | ICD-10-CM | POA: Diagnosis not present

## 2017-04-22 DIAGNOSIS — R293 Abnormal posture: Secondary | ICD-10-CM

## 2017-04-22 DIAGNOSIS — M6281 Muscle weakness (generalized): Secondary | ICD-10-CM | POA: Diagnosis not present

## 2017-04-22 DIAGNOSIS — N183 Chronic kidney disease, stage 3 (moderate): Secondary | ICD-10-CM | POA: Diagnosis not present

## 2017-04-22 DIAGNOSIS — Z6824 Body mass index (BMI) 24.0-24.9, adult: Secondary | ICD-10-CM | POA: Diagnosis not present

## 2017-04-22 DIAGNOSIS — I10 Essential (primary) hypertension: Secondary | ICD-10-CM | POA: Diagnosis not present

## 2017-04-22 NOTE — Therapy (Signed)
[facility] [address] [place], [place], [address] Phone: [phone]   Fax:  [phone]  Physical Therapy Treatment  Patient Details  Name: Julie Lara MRN: [id number] Date of Birth: 1921-03-01 Referring Provider: Martinique, Peter MD   Encounter Date: 04/22/2017  PT End of Session - 04/22/17 1304    Visit Number  6    Number of Visits  9    Date for PT Re-Evaluation  05/28/17    Authorization Type  Medicare    Authorization Time Period  03/29/17-05/28/17    PT Start Time  1230    PT Stop Time  1311    PT Time Calculation (min)  41 min    Equipment Utilized During Treatment  Gait belt    Activity Tolerance  Patient tolerated treatment well    Behavior During Therapy  [facility] for tasks assessed/performed       Past Medical History:  Diagnosis Date  . Acute blood loss anemia    November 2014  . Aortic insufficiency    mild to moderate  . Arthritis    "right upper arm" (02/01/2017)  . Atrial fibrillation ([place])   . CAD (coronary artery disease)   . Chest pain 04-2010   atypical with negative echocardiogram  . Dilated cardiomyopathy ([place])   . Goiter   . Hypothyroidism   . Idiopathic cardiomyopathy ([name]) 1999   EF 40% by Echo on 10/13/12  . NSTEMI (non-ST elevated myocardial infarction) [facility]) July 2014   95% prox RCA s/p BMS  . PAC (premature atrial contraction)    Chronic    Past Surgical History:  Procedure Laterality Date  . biopsy of sacral lesion  2010  . CARDIAC CATHETERIZATION    . COLONOSCOPY N/A 02/23/2013   Procedure: COLONOSCOPY;  Surgeon: Ladene Artist, MD;  Location: [facility] ENDOSCOPY;  Service: Endoscopy;  Laterality: N/A;  . CORONARY ANGIOPLASTY WITH STENT PLACEMENT  09/2012   BMS-95% prox RCA  . ESOPHAGOGASTRODUODENOSCOPY N/A 02/22/2013   Procedure: ESOPHAGOGASTRODUODENOSCOPY (EGD);  Surgeon: Milus Banister, MD;  Location: [facility];  Service: Endoscopy;  Laterality: N/A;  . LEFT HEART  CATHETERIZATION WITH CORONARY ANGIOGRAM N/A 10/14/2012   Procedure: LEFT HEART CATHETERIZATION WITH CORONARY ANGIOGRAM;  Surgeon: Wellington Hampshire, MD;  Location: [facility] CATH LAB;  Service: Cardiovascular;  Laterality: N/A;  . PERCUTANEOUS CORONARY STENT INTERVENTION (PCI-S) Right 10/14/2012   Procedure: PERCUTANEOUS CORONARY STENT INTERVENTION (PCI-S);  Surgeon: Wellington Hampshire, MD;  Location: [facility] CATH LAB;  Service: Cardiovascular;  Laterality: Right;  . THYROIDECTOMY  2008  . VAGINAL HYSTERECTOMY     Complete    Vitals:   04/22/17 1243  BP: 132/81  Pulse: (!) 101  SpO2: 99%    Subjective Assessment - 04/22/17 1228    Subjective  sees a specialist today to discuss some pevic floor concerns.  doesn't know what she wants to work on today.  reports she was doing her exercises the other day.    Pertinent History  coronary artery disease, chronic systolic CHF, atrial fibrillation not anticoagulated, and hypothyroidism    Patient Stated Goals  I guess I want to get stronger    Currently in Pain?  No/denies                      [facility] Adult PT Treatment/Exercise - 04/22/17 1237      Ambulation/Gait   Ambulation/Gait Assistance  4: Min guard;5: Supervision    Ambulation Distance (Feet)  360 Feet    Assistive device  Straight cane    Gait Pattern  Step-through pattern    Gait Comments  increased SOB noted today      Knee/Hip Exercises: Aerobic   Nustep  L5 x 3 min; L 3 x 2 min monitoring SOB      Knee/Hip Exercises: Standing   Knee Flexion  Both;15 reps 2#    Hip Flexion  Both;15 reps;Knee bent 2#    Hip Abduction  Both;15 reps;Knee straight 2#    Hip Extension  Both;15 reps;Knee straight 2#    Forward Step Up  Both;10 reps;Hand Hold: 2;Step Height: 4"    Other Standing Knee Exercises  sidestepping and marching forwards/backwards with 2# ankle weights and bil UE support 10' x 2 reps each               PT Short Term Goals - 04/08/17 1442      PT SHORT TERM GOAL  #1   Title  Complete 6 MWT and set LTG as appropriate.    Status  Achieved        PT Long Term Goals - 04/08/17 1442      PT LONG TERM GOAL #1   Title  Pt will perform home exercises for balance and strengthening with mod I using paper handout . Target date for STGs: 04/28/17    Status  On-going    Target Date  04/28/17      PT LONG TERM GOAL #2   Title  Patient will improve normal TUG to <19 seconds to indicate progress towards lower fall risk.     Status  On-going      PT LONG TERM GOAL #3   Title  Patient will improve gait velocity to >=1.8 ft/sec to indicate lesser fall risk.      Status  On-going      PT LONG TERM GOAL #4   Title  Patient will ambulate >= 300 ft with LRAD modified independent over level indoor surfaces.     Status  On-going      PT LONG TERM GOAL #5   Title  improve 6 MWT to at least 675' for improved endurance and mobility    Status  New            Plan - 04/22/17 1304    Clinical Impression Statement  Pt with increased SOB today but vitals stable, and pt with MD appt following PT session today.  Pt reports increased phlegm causing SOB.  Tolerated session well today and anticipate pt will be ready for d/c next week.     PT Treatment/Interventions  ADLs/Self Care Home Management;DME Instruction;Gait training;Balance training;Therapeutic exercise;Therapeutic activities;Functional mobility training;Patient/family education;Passive range of motion;Energy conservation    PT Next Visit Plan  LE strength and balance; gait training    Consulted and Agree with Plan of Care  Patient       Patient will benefit from skilled therapeutic intervention in order to improve the following deficits and impairments:  Abnormal gait, Cardiopulmonary status limiting activity, Decreased activity tolerance, Decreased balance, Decreased mobility, Decreased knowledge of use of DME, Decreased endurance, Decreased strength, Increased edema, Postural dysfunction  Visit  Diagnosis: Muscle weakness (generalized)  Unsteadiness on feet  Abnormal posture  Other abnormalities of gait and mobility     Problem List Patient Active Problem List   Diagnosis Date Noted  . Essential hypertension 02/11/2017  . Acute on chronic systolic CHF (congestive heart failure) ([place]) 02/01/2017  .  HLD (hyperlipidemia) 02/01/2017  . Atrial fibrillation with RVR ([place])   . Chest pain, rule out acute myocardial infarction 07/18/2016  . Acute blood loss anemia 03/12/2013  . CAD (coronary artery disease)   . Benign neoplasm of colon 02/23/2013  . Nonspecific abnormal finding in stool contents 02/23/2013  . Near syncope 02/21/2013  . Anemia 02/21/2013  . Acute on chronic renal failure ([place]) 02/21/2013  . Chronic systolic congestive heart failure ([place]) 02/21/2013  . GI bleed 02/21/2013  . Chronic anticoagulation 02/21/2013  . Coronary artery disease with history of myocardial infarction without history of CABG 02/21/2013  . Resting chest pain 10/27/2012  . Hypothyroidism 10/15/2012  . Atrial fibrillation ([place]) 10/15/2012  . CKD (chronic kidney disease), stage III ([place]) 10/15/2012  . UTI (urinary tract infection) 10/15/2012  . Dilated cardiomyopathy ([place])   . Aortic insufficiency   . PAC (premature atrial contraction)   . Chest pain 04/02/2010      Laureen Abrahams, PT, DPT 04/22/17 1:13 PM    [facility] [address] [place], [place], [address] Phone: [phone]   Fax:  [phone]  Name: GERLEAN CID MRN: [id number] Date of Birth: 1920-12-21

## 2017-04-23 ENCOUNTER — Telehealth: Payer: Self-pay | Admitting: Cardiology

## 2017-04-23 MED ORDER — FUROSEMIDE 40 MG PO TABS: 40.0000 mg | ORAL_TABLET | Freq: Every day | ORAL | 1 refills | Status: DC

## 2017-04-23 NOTE — Telephone Encounter (Signed)
New message     *STAT* If patient is at the pharmacy, call can be transferred to refill team.   1. Which medications need to be refilled? (please list name of each medication and dose if known) furosemide (LASIX) 40 MG tablet  2. Which pharmacy/location (including street and city if local pharmacy) is medication to be sent to? [facility] and [place]  3. Do they need a 30 day or 90 day supply? [place]

## 2017-04-24 ENCOUNTER — Telehealth: Payer: Self-pay | Admitting: Cardiology

## 2017-04-24 ENCOUNTER — Other Ambulatory Visit: Payer: Self-pay | Admitting: *Deleted

## 2017-04-24 ENCOUNTER — Encounter: Payer: Self-pay | Admitting: Physical Therapy

## 2017-04-24 ENCOUNTER — Ambulatory Visit: Payer: Medicare Other | Admitting: Physical Therapy

## 2017-04-24 DIAGNOSIS — R2681 Unsteadiness on feet: Secondary | ICD-10-CM | POA: Diagnosis not present

## 2017-04-24 DIAGNOSIS — M6281 Muscle weakness (generalized): Secondary | ICD-10-CM | POA: Diagnosis not present

## 2017-04-24 DIAGNOSIS — R2689 Other abnormalities of gait and mobility: Secondary | ICD-10-CM

## 2017-04-24 DIAGNOSIS — R293 Abnormal posture: Secondary | ICD-10-CM

## 2017-04-24 NOTE — Telephone Encounter (Signed)
Returned call to [employer] with [facility]-she states that daughter reported patient having a cough x 1 week.   States it sounds productive but patient is not coughing anything up.   Cough is persistent but does get worse lying down at night.  Daughter reports patient is not SOB with or without exertion, no weight gain, no swelling or edema.   Patient is taking all medications as prescribed.   Daughter has not tried otc medications but wanted to reach out and see what she should do about cough.    Advised Monica to have patient contact PCP to rule out other causes and I would sent message to Dr. Martinique to review for further recommendations.

## 2017-04-24 NOTE — Telephone Encounter (Signed)
New message  [name] from [name] called requesting to speak with RN. She states pt is concerned about pt having a bad cough. She states pt does not have any SOB or swelling. Julie Lara would like to discuss further what pt should do about her bad cough

## 2017-04-24 NOTE — Telephone Encounter (Signed)
Monica at [facility] made aware and verbalized understanding.

## 2017-04-24 NOTE — Patient Outreach (Signed)
[facility]) Care Management  04/24/2017  Julie Lara [birth year] [id number]   Call received from daughter, Julie Lara, voicing concern regarding a cough the member has had for almost a week.  She state member has denied shortness of breath or sore throat.  She report cough sounds like she has phlegm but cough hasn't been productive.  Member's weight has not changed, but she does report increased cough when trying to lie flat at night.  Member had appointment with primary MD on Monday, however cough was not addressed.  Julie Lara also called cardiology office on yesterday to request another refill of Lasix, but didn't report cough at that time.  Denies using over the counter medications for cough.  Call placed to cardiology office to report concerns.  Spoke with Angie Fava, per MD advises member to contact primary MD with concerns, ok to take Mucinex.    Call placed back to Julie Lara to provide update and instructions.  She verbalizes understanding.  Advised to contact this care manager with ongoing concerns/questions.  Will follow up with routine home visit next month as planned.  Valente David, [place], MSN [place] [phone]

## 2017-04-24 NOTE — Therapy (Signed)
[facility] [address] [place], [place], [address] Phone: [phone]   Fax:  [phone]  Physical Therapy Treatment  Patient Details  Name: Julie Lara MRN: [id number] Date of Birth: [birth year] Referring Provider: Martinique, Peter MD   Encounter Date: 04/24/2017  PT End of Session - 04/24/17 1448    Visit Number  7    Number of Visits  9    Date for PT Re-Evaluation  05/28/17    Authorization Type  Medicare    Authorization Time Period  03/29/17-05/28/17    PT Start Time  1400    PT Stop Time  1440    PT Time Calculation (min)  40 min    Activity Tolerance  Patient tolerated treatment well    Behavior During Therapy  [facility] for tasks assessed/performed       Past Medical History:  Diagnosis Date  . Acute blood loss anemia    November 2014  . Aortic insufficiency    mild to moderate  . Arthritis    "right upper arm" (02/01/2017)  . Atrial fibrillation ([place])   . CAD (coronary artery disease)   . Chest pain 04-2010   atypical with negative echocardiogram  . Dilated cardiomyopathy ([place])   . Goiter   . Hypothyroidism   . Idiopathic cardiomyopathy ([place]) 1999   EF 40% by Echo on 10/13/12  . NSTEMI (non-ST elevated myocardial infarction) [facility]) July 2014   95% prox RCA s/p BMS  . PAC (premature atrial contraction)    Chronic    Past Surgical History:  Procedure Laterality Date  . biopsy of sacral lesion  2010  . CARDIAC CATHETERIZATION    . COLONOSCOPY N/A 02/23/2013   Procedure: COLONOSCOPY;  Surgeon: Ladene Artist, MD;  Location: [facility] ENDOSCOPY;  Service: Endoscopy;  Laterality: N/A;  . CORONARY ANGIOPLASTY WITH STENT PLACEMENT  09/2012   BMS-95% prox RCA  . ESOPHAGOGASTRODUODENOSCOPY N/A 02/22/2013   Procedure: ESOPHAGOGASTRODUODENOSCOPY (EGD);  Surgeon: Milus Banister, MD;  Location: [facility];  Service: Endoscopy;  Laterality: N/A;  . LEFT HEART CATHETERIZATION WITH CORONARY ANGIOGRAM N/A  10/14/2012   Procedure: LEFT HEART CATHETERIZATION WITH CORONARY ANGIOGRAM;  Surgeon: Wellington Hampshire, MD;  Location: [facility] CATH LAB;  Service: Cardiovascular;  Laterality: N/A;  . PERCUTANEOUS CORONARY STENT INTERVENTION (PCI-S) Right 10/14/2012   Procedure: PERCUTANEOUS CORONARY STENT INTERVENTION (PCI-S);  Surgeon: Wellington Hampshire, MD;  Location: [facility] CATH LAB;  Service: Cardiovascular;  Laterality: Right;  . THYROIDECTOMY  2008  . VAGINAL HYSTERECTOMY     Complete    There were no vitals filed for this visit.  Subjective Assessment - 04/24/17 1405    Subjective  saw MD on 1/21 for urinary concerns; no changes to medications. SOB is better today.  doing "some" exercises, "but no all of them."    Patient Stated Goals  I guess I want to get stronger    Currently in Pain?  No/denies                      [facility] Adult PT Treatment/Exercise - 04/24/17 1408      Ambulation/Gait   Ambulation/Gait Assistance  4: Min guard    Ambulation Distance (Feet)  250 Feet    Assistive device  Straight cane    Gait Pattern  Step-through pattern    Gait Comments  with cognitive tasks and cues for increased step length and heel strike      Knee/Hip Exercises: Seated  Long [employer]  Both;15 reps;Weights    Long Arc Quad Weight  2 lbs.    Ball Squeeze  15 x 5 sec    Clamshell with TheraBand  Red 15 reps    Marching  Both;15 reps    Marching Weights  2 lbs.    Hamstring Curl  15 reps;Both red theraband    Sit to Sand  15 reps;without UE support with overhead ball reach upon standing      Knee/Hip Exercises: Supine   Bridges  Both;15 reps    Straight Leg Raises  Both;15 reps 2#               PT Short Term Goals - 04/08/17 1442      PT SHORT TERM GOAL #1   Title  Complete 6 MWT and set LTG as appropriate.    Status  Achieved        PT Long Term Goals - 04/08/17 1442      PT LONG TERM GOAL #1   Title  Pt will perform home exercises for balance and strengthening with mod  I using paper handout . Target date for STGs: 04/28/17    Status  On-going    Target Date  04/28/17      PT LONG TERM GOAL #2   Title  Patient will improve normal TUG to <19 seconds to indicate progress towards lower fall risk.     Status  On-going      PT LONG TERM GOAL #3   Title  Patient will improve gait velocity to >=1.8 ft/sec to indicate lesser fall risk.      Status  On-going      PT LONG TERM GOAL #4   Title  Patient will ambulate >= 300 ft with LRAD modified independent over level indoor surfaces.     Status  On-going      PT LONG TERM GOAL #5   Title  improve 6 MWT to at least 675' for improved endurance and mobility    Status  New            Plan - 04/24/17 1448    Clinical Impression Statement  Pt tolerated session well, but noted increased SOB with supine exercises so returned to sitting and SOB improved.  O2 sats > 90% throughout session. Anticipate pt will be ready for d/c and transition to HEP next week.    PT Treatment/Interventions  ADLs/Self Care Home Management;DME Instruction;Gait training;Balance training;Therapeutic exercise;Therapeutic activities;Functional mobility training;Patient/family education;Passive range of motion;Energy conservation    PT Next Visit Plan  LE strength and balance; gait training; begin checking goals; plan for d/c    Consulted and Agree with Plan of Care  Patient       Patient will benefit from skilled therapeutic intervention in order to improve the following deficits and impairments:  Abnormal gait, Cardiopulmonary status limiting activity, Decreased activity tolerance, Decreased balance, Decreased mobility, Decreased knowledge of use of DME, Decreased endurance, Decreased strength, Increased edema, Postural dysfunction  Visit Diagnosis: Muscle weakness (generalized)  Unsteadiness on feet  Abnormal posture  Other abnormalities of gait and mobility     Problem List Patient Active Problem List   Diagnosis Date Noted   . Essential hypertension 02/11/2017  . Acute on chronic systolic CHF (congestive heart failure) ([place]) 02/01/2017  . HLD (hyperlipidemia) 02/01/2017  . Atrial fibrillation with RVR ([place])   . Chest pain, rule out acute myocardial infarction 07/18/2016  . Acute blood loss anemia 03/12/2013  .  CAD (coronary artery disease)   . Benign neoplasm of colon 02/23/2013  . Nonspecific abnormal finding in stool contents 02/23/2013  . Near syncope 02/21/2013  . Anemia 02/21/2013  . Acute on chronic renal failure ([place]) 02/21/2013  . Chronic systolic congestive heart failure ([place]) 02/21/2013  . GI bleed 02/21/2013  . Chronic anticoagulation 02/21/2013  . Coronary artery disease with history of myocardial infarction without history of CABG 02/21/2013  . Resting chest pain 10/27/2012  . Hypothyroidism 10/15/2012  . Atrial fibrillation ([place]) 10/15/2012  . CKD (chronic kidney disease), stage III ([place]) 10/15/2012  . UTI (urinary tract infection) 10/15/2012  . Dilated cardiomyopathy ([place])   . Aortic insufficiency   . PAC (premature atrial contraction)   . Chest pain 04/02/2010      Laureen Abrahams, PT, DPT 04/24/17 2:52 PM    [facility] [address] [place], [place], [address] Phone: [phone]   Fax:  [phone]  Name: Julie Lara MRN: [id number] Date of Birth: 09-29-1920

## 2017-04-24 NOTE — Telephone Encounter (Signed)
Agree would check with primary care. OK to take Mucinex from my standpoint.  Yaroslav Gombos [place] MD, [facility]

## 2017-04-29 ENCOUNTER — Ambulatory Visit: Payer: Medicare Other | Admitting: Physical Therapy

## 2017-04-29 ENCOUNTER — Encounter: Payer: Self-pay | Admitting: Physical Therapy

## 2017-04-29 DIAGNOSIS — M6281 Muscle weakness (generalized): Secondary | ICD-10-CM | POA: Diagnosis not present

## 2017-04-29 DIAGNOSIS — R293 Abnormal posture: Secondary | ICD-10-CM | POA: Diagnosis not present

## 2017-04-29 DIAGNOSIS — R2689 Other abnormalities of gait and mobility: Secondary | ICD-10-CM

## 2017-04-29 DIAGNOSIS — R2681 Unsteadiness on feet: Secondary | ICD-10-CM | POA: Diagnosis not present

## 2017-04-29 NOTE — Therapy (Signed)
[facility] [address] [place], [place], [address] Phone: [phone]   Fax:  [phone]  Physical Therapy Treatment and Discharge Summary  Patient Details  Name: Julie Lara MRN: [id number] Date of Birth: [birth year] Referring Provider: [place], Peter MD   Encounter Date: 04/29/2017  PT End of Session - 04/29/17 1934    Visit Number  8    Number of Visits  9    Date for PT Re-Evaluation  05/28/17    Authorization Type  Medicare    Authorization Time Period  03/29/17-05/28/17    PT Start Time  1106    PT Stop Time  1146    PT Time Calculation (min)  40 min    Equipment Utilized During Treatment  Gait belt    Activity Tolerance  Patient tolerated treatment well    Behavior During Therapy  [facility] for tasks assessed/performed       Past Medical History:  Diagnosis Date  . Acute blood loss anemia    November 2014  . Aortic insufficiency    mild to moderate  . Arthritis    "right upper arm" (02/01/2017)  . Atrial fibrillation ([place])   . CAD (coronary artery disease)   . Chest pain 04-2010   atypical with negative echocardiogram  . Dilated cardiomyopathy ([place])   . Goiter   . Hypothyroidism   . Idiopathic cardiomyopathy ([place]) 1999   EF 40% by Echo on 10/13/12  . NSTEMI (non-ST elevated myocardial infarction) [facility]) July 2014   95% prox RCA s/p BMS  . PAC (premature atrial contraction)    Chronic    Past Surgical History:  Procedure Laterality Date  . biopsy of sacral lesion  2010  . CARDIAC CATHETERIZATION    . COLONOSCOPY N/A 02/23/2013   Procedure: COLONOSCOPY;  Surgeon: Ladene Artist, MD;  Location: [facility] ENDOSCOPY;  Service: Endoscopy;  Laterality: N/A;  . CORONARY ANGIOPLASTY WITH STENT PLACEMENT  09/2012   BMS-95% prox RCA  . ESOPHAGOGASTRODUODENOSCOPY N/A 02/22/2013   Procedure: ESOPHAGOGASTRODUODENOSCOPY (EGD);  Surgeon: Milus Banister, MD;  Location: [place];  Service: Endoscopy;   Laterality: N/A;  . LEFT HEART CATHETERIZATION WITH CORONARY ANGIOGRAM N/A 10/14/2012   Procedure: LEFT HEART CATHETERIZATION WITH CORONARY ANGIOGRAM;  Surgeon: Wellington Hampshire, MD;  Location: [place] CATH LAB;  Service: Cardiovascular;  Laterality: N/A;  . PERCUTANEOUS CORONARY STENT INTERVENTION (PCI-S) Right 10/14/2012   Procedure: PERCUTANEOUS CORONARY STENT INTERVENTION (PCI-S);  Surgeon: Wellington Hampshire, MD;  Location: [facility] CATH LAB;  Service: Cardiovascular;  Laterality: Right;  . THYROIDECTOMY  2008  . VAGINAL HYSTERECTOMY     Complete    There were no vitals filed for this visit.  Subjective Assessment - 04/29/17 1110    Subjective  I don't stay still at home. I've been doing the exercises off and on.     Patient Stated Goals  I guess I want to get stronger    Currently in Pain?  No/denies         [facility] PT Assessment - 04/29/17 0001      6 minute walk test results    Aerobic Endurance Distance Walked  560                  [facility] Adult PT Treatment/Exercise - 04/29/17 0001      Transfers   Transfers  Sit to Stand;Stand to Sit    Sit to Stand  6: Modified independent (Device/Increase time);With armrests;With upper extremity assist  Stand to Sit  6: Modified independent (Device/Increase time);With upper extremity assist;With armrests      Ambulation/Gait   Ambulation/Gait Assistance  4: Min guard;4: Min assist    Ambulation/Gait Assistance Details  during minutes 3-6 during 6MWT pt with multiple episodes of drifting rt or lt and brushing up against rt hand wall    Ambulation Distance (Feet)  560 Feet    Assistive device  Straight cane    Gait Pattern  Step-through pattern;Wide base of support;Right foot flat;Left foot flat    Gait velocity  32.8/17.19=1.91      Knee/Hip Exercises: Aerobic   Nustep  L4 x  4 minutes            PT Education - 04/29/17 1934    Education provided  Yes    Education Details  results of assessing LTGs    Person(s) Educated   Patient    Methods  Explanation    Comprehension  Verbalized understanding       PT Short Term Goals - 04/08/17 1442      PT SHORT TERM GOAL #1   Title  Complete 6 MWT and set LTG as appropriate.    Status  Achieved        PT Long Term Goals - 04/29/17 1929      PT LONG TERM GOAL #1   Title  Pt will perform home exercises for balance and strengthening with mod I using paper handout . Target date for STGs: 04/28/17    Baseline  04/29/17 met    Status  Achieved      PT LONG TERM GOAL #2   Title  Patient will improve normal TUG to <19 seconds to indicate progress towards lower fall risk.     Baseline  04/29/17 20.97    Status  Partially Met      PT LONG TERM GOAL #3   Title  Patient will improve gait velocity to >=1.8 ft/sec to indicate lesser fall risk.      Baseline  04/29/17  1.91 ft/sec    Status  Achieved      PT LONG TERM GOAL #4   Title  Patient will ambulate >= 300 ft with LRAD modified independent over level indoor surfaces.     Baseline  04/29/17 Continues to require minguard assist for longer distances    Status  Partially Met      PT LONG TERM GOAL #5   Title  improve 6 MWT to at least 675' for improved endurance and mobility    Baseline  04/29/17  560 ft (up from 525 ft)    Status  Partially Met            Plan - 04/29/17 1935    Clinical Impression Statement  Assessed LTGs with pt meeting 2 of 5 goals and partially meeting the remaining 3 of 5 goals (progressed, but not to goal level). Patient noted to have drifting left/right and loss of balance requiring min assist to recover as she became fatigued during the 6 MWT. Patient agreed to discharge from PT at this time.     PT Treatment/Interventions  ADLs/Self Care Home Management;DME Instruction;Gait training;Balance training;Therapeutic exercise;Therapeutic activities;Functional mobility training;Patient/family education;Passive range of motion;Energy conservation    Consulted and Agree with Plan of Care   Patient       Patient will benefit from skilled therapeutic intervention in order to improve the following deficits and impairments:  Abnormal gait, Cardiopulmonary status limiting activity, Decreased activity tolerance,  Decreased balance, Decreased mobility, Decreased knowledge of use of DME, Decreased endurance, Decreased strength, Increased edema, Postural dysfunction  Visit Diagnosis: Muscle weakness (generalized)  Unsteadiness on feet  Other abnormalities of gait and mobility     Problem List Patient Active Problem List   Diagnosis Date Noted  . Essential hypertension 02/11/2017  . Acute on chronic systolic CHF (congestive heart failure) ([place]) 02/01/2017  . HLD (hyperlipidemia) 02/01/2017  . Atrial fibrillation with RVR ([place])   . Chest pain, rule out acute myocardial infarction 07/18/2016  . Acute blood loss anemia 03/12/2013  . CAD (coronary artery disease)   . Benign neoplasm of colon 02/23/2013  . Nonspecific abnormal finding in stool contents 02/23/2013  . Near syncope 02/21/2013  . Anemia 02/21/2013  . Acute on chronic renal failure ([place]) 02/21/2013  . Chronic systolic congestive heart failure ([place]) 02/21/2013  . GI bleed 02/21/2013  . Chronic anticoagulation 02/21/2013  . Coronary artery disease with history of myocardial infarction without history of CABG 02/21/2013  . Resting chest pain 10/27/2012  . Hypothyroidism 10/15/2012  . Atrial fibrillation ([place]) 10/15/2012  . CKD (chronic kidney disease), stage III ([place]) 10/15/2012  . UTI (urinary tract infection) 10/15/2012  . Dilated cardiomyopathy ([place])   . Aortic insufficiency   . PAC (premature atrial contraction)   . Chest pain 04/02/2010   PHYSICAL THERAPY DISCHARGE SUMMARY  Visits from Start of Care: 8  Current functional level related to goals / functional outcomes: Improved gait velocity above threshold for higher fall risk (1.8 ft/sec; pt 1.91 ft/sec); continues with instability as she fatigues    Remaining deficits: Imbalance with fatigue   Education / Equipment: HEP  Plan: Patient agrees to discharge.  Patient goals were partially met. Patient is being discharged due to being pleased with the current functional level.  ?????        Rexanne Mano, PT 04/29/2017, 7:41 PM  [facility] [address] [place], [place], [address] Phone: [phone]   Fax:  [phone]  Name: Julie Lara MRN: [id number] Date of Birth: October 10, 1920

## 2017-05-01 ENCOUNTER — Ambulatory Visit: Payer: Medicare Other | Admitting: Physical Therapy

## 2017-05-07 ENCOUNTER — Encounter: Payer: Self-pay | Admitting: Gastroenterology

## 2017-05-07 ENCOUNTER — Other Ambulatory Visit: Payer: Self-pay | Admitting: *Deleted

## 2017-05-07 ENCOUNTER — Ambulatory Visit (INDEPENDENT_AMBULATORY_CARE_PROVIDER_SITE_OTHER): Payer: Medicare Other | Admitting: Gastroenterology

## 2017-05-07 VITALS — BP 112/72 | HR 96 | Ht 61.0 in | Wt 131.2 lb

## 2017-05-07 DIAGNOSIS — R933 Abnormal findings on diagnostic imaging of other parts of digestive tract: Secondary | ICD-10-CM

## 2017-05-07 NOTE — Progress Notes (Signed)
Review of pertinent gastrointestinal problems: 1. Signficant Hemoccult-positive anemia, hemoglobin 6.6 discovered November 2014.  EGD Dr. Ardis Hughs November 2014 was normal. Colonoscopy Dr. Fuller Plan November 2014 was normal except for mild diverticulosis and 8 mm adenomatous polyp. Outpatient capsule endoscopy was normal. She takes eloquis contributes to whatever blood loss she is having. ASA stopped after that admission.    HPI: This is a very pleasant 84 year old woman whom I last saw when she was hospitalized at [facility].  About 2 months ago    Chief complaint is abnormal barium esophagram   I briefly saw her the last day of her admission this past December.  She was admitted with shortness of breath, edema.  Found to be having a CHF exacerbation.  She was diuresed and improved from a breathing standpoint.  During the hospital stay she admitted to having some new mild dysphasia and so she underwent testing with a barium esophagram   Barium esophagram suggested minor narrowing at GE junction.  She's had only brief swallowing trouble associated with a lot of mucous and phlegm.  Radiologist felt the narrowing likely to be from reflux and I agree clinically.    Given her very advanced age and her minor symptoms and radiographic findings I  Did not feel she needed further testing.  I did however arrange for this follow-up visit to check on how she is doing.  She has been taking proton pump inhibitor once daily shortly before her breakfast meal  since discharge.  No swallowing trouble at all since she went home from the hospital.  No odynophagia.  Does have some mucous issues, and takes mucinex for that.   Mucous with coughing, productive cough.   ROS: complete GI ROS as described in HPI, all other review negative.  Constitutional:  No unintentional weight loss   Past Medical History:  Diagnosis Date  . Acute blood loss anemia    November 2014  . Aortic insufficiency    mild to  moderate  . Arthritis    "right upper arm" (02/01/2017)  . Atrial fibrillation ([place])   . CAD (coronary artery disease)   . Chest pain 04-2010   atypical with negative echocardiogram  . Dilated cardiomyopathy ([place])   . Goiter   . Hypothyroidism   . Idiopathic cardiomyopathy ([place]) 1999   EF 40% by Echo on 10/13/12  . NSTEMI (non-ST elevated myocardial infarction) [facility]) July 2014   95% prox RCA s/p BMS  . PAC (premature atrial contraction)    Chronic    Past Surgical History:  Procedure Laterality Date  . biopsy of sacral lesion  2010  . CARDIAC CATHETERIZATION    . COLONOSCOPY N/A 02/23/2013   Procedure: COLONOSCOPY;  Surgeon: Ladene Artist, MD;  Location: [facility] ENDOSCOPY;  Service: Endoscopy;  Laterality: N/A;  . CORONARY ANGIOPLASTY WITH STENT PLACEMENT  09/2012   BMS-95% prox RCA  . ESOPHAGOGASTRODUODENOSCOPY N/A 02/22/2013   Procedure: ESOPHAGOGASTRODUODENOSCOPY (EGD);  Surgeon: Milus Banister, MD;  Location: [place];  Service: Endoscopy;  Laterality: N/A;  . LEFT HEART CATHETERIZATION WITH CORONARY ANGIOGRAM N/A 10/14/2012   Procedure: LEFT HEART CATHETERIZATION WITH CORONARY ANGIOGRAM;  Surgeon: Wellington Hampshire, MD;  Location: [place] CATH LAB;  Service: Cardiovascular;  Laterality: N/A;  . PERCUTANEOUS CORONARY STENT INTERVENTION (PCI-S) Right 10/14/2012   Procedure: PERCUTANEOUS CORONARY STENT INTERVENTION (PCI-S);  Surgeon: Wellington Hampshire, MD;  Location: [facility] CATH LAB;  Service: Cardiovascular;  Laterality: Right;  . THYROIDECTOMY  2008  . VAGINAL HYSTERECTOMY  Complete    Current Outpatient Medications  Medication Sig Dispense Refill  . aspirin EC 81 MG tablet Take 81 mg by mouth daily.    Marland Kitchen atorvastatin (LIPITOR) 40 MG tablet Take 1 tablet (40 mg total) by mouth daily at 6 PM. 90 tablet 3  . Cholecalciferol (VITAMIN D PO) Take 1,000 Units by mouth daily.     . Cyanocobalamin (VITAMIN B-12 PO) Take 1 tablet by mouth daily.    . furosemide (LASIX) 40 MG tablet Take 1  tablet (40 mg total) by mouth daily. 90 tablet 1  . iron polysaccharides (FERREX 150) 150 MG capsule Take 150 mg by mouth 2 (two) times daily.     Marland Kitchen levothyroxine (SYNTHROID, LEVOTHROID) 25 MCG tablet Take 25 mcg by mouth daily before breakfast.    . metoprolol tartrate (LOPRESSOR) 25 MG tablet Take 1 tablet (25 mg total) 2 (two) times daily by mouth. 90 tablet 3  . Nutritional Supplements (FEEDING SUPPLEMENT, NEPRO CARB STEADY,) LIQD Take 237 mLs 2 (two) times daily between meals by mouth.    . pantoprazole (PROTONIX) 40 MG tablet Take 1 tablet (40 mg total) by mouth daily at 6 (six) AM. 30 tablet 0  . nitroGLYCERIN (NITROSTAT) 0.4 MG SL tablet Place 1 tablet (0.4 mg total) under the tongue every 5 (five) minutes as needed for chest pain. (Patient not taking: Reported on 05/07/2017) 25 tablet 3   No current facility-administered medications for this visit.     Allergies as of 05/07/2017  . (No Known Allergies)    Family History  Problem Relation Age of Onset  . Diabetes Sister     Social History   Socioeconomic History  . Marital status: Widowed    Spouse name: Not on file  . Number of children: Not on file  . Years of education: Not on file  . Highest education level: Not on file  Social Needs  . Financial resource strain: Not on file  . Food insecurity - worry: Not on file  . Food insecurity - inability: Not on file  . Transportation needs - medical: Not on file  . Transportation needs - non-medical: Not on file  Occupational History  . Occupation: Retired    Fish farm manager: [employer]  Tobacco Use  . Smoking status: Never Smoker  . Smokeless tobacco: Never Used  Substance and Sexual Activity  . Alcohol use: No  . Drug use: No  . Sexual activity: Not Currently  Other Topics Concern  . Not on file  Social History Narrative   Pt lives alone, but has family close by.     Physical Exam: Ht 5\' 1"  (1.549 m)   Wt 131 lb 4 oz (59.5 kg)   BMI 24.80 kg/m   Constitutional: generally well-appearing Psychiatric: alert and oriented x3 Abdomen: soft, nontender, nondistended, no obvious ascites, no peritoneal signs, normal bowel sounds No peripheral edema noted in lower extremities  Assessment and plan: 84 y.o. female with single episode of dysphasia, abnormal barium esophagram  I think the barium esophagram represents some minor acid related GE junction issues.  She has no dysphasia since that single episode while she was in the hospital.  She is not losing weight.  She has no odynophasia.  I do not think she needs any further testing for this but she understands that if she has dysphasia to  call here.  Please see the "Patient Instructions" section for addition details about the plan.  Owens Loffler, MD [facility] 05/07/2017, 2:21 PM

## 2017-05-07 NOTE — Patient Outreach (Signed)
[facility] [facility]) Care Management  05/07/2017  CHRISTOL THETFORD 1920-05-02 [id number]   Call placed to member to change time for home visit next week due to conflict of interest in this care manager's schedule.  She agrees to time change.  Reminded of appointment today with GI specialist.  Will follow up next week.  Valente David, [place], MSN [place] [phone]

## 2017-05-07 NOTE — Patient Instructions (Signed)
Call Dr. Ardis Hughs if you have trouble with swallowing (food, liquids hanging or catching after you swallow).

## 2017-05-14 ENCOUNTER — Other Ambulatory Visit: Payer: Self-pay | Admitting: *Deleted

## 2017-05-14 NOTE — Patient Outreach (Signed)
[facility]) Care Management   05/14/2017  Julie Lara 12-20-1920 [id number]  Julie Lara is an 84 y.o. female  Subjective:   Member alert and oriented x3, denies pain or discomfort.  Report compliance with medications.  State she has a prolapse for which she will see the general surgeon for this Friday, unsure if it is uterine or bladder.  She has been compliant with daily weights, remain stable between 127-132 pounds over the past month.  Has follow up appointments with primary MD and cardiologist next month (3/21 & 3/25).  Objective:   Review of Systems  Constitutional: Negative.   HENT: Positive for congestion.   Eyes: Negative.   Respiratory: Positive for sputum production.        Intermittent  Cardiovascular: Negative.   Gastrointestinal: Negative.   Genitourinary: Negative.   Musculoskeletal: Negative.   Skin: Negative.   Neurological: Negative.   Endo/Heme/Allergies: Negative.   Psychiatric/Behavioral: Negative.     Physical Exam  Constitutional: She is oriented to person, place, and time. She appears well-developed and well-nourished.  Neck: Normal range of motion.  Cardiovascular: Normal heart sounds.  Irregular  Respiratory: Effort normal and breath sounds normal.  GI: Soft. Bowel sounds are normal.  Musculoskeletal: Normal range of motion.  Neurological: She is alert and oriented to person, place, and time.  Skin: Skin is warm and dry.   BP 118/62 (BP Location: Left Arm, Patient Position: Sitting, Cuff Size: Normal)   Pulse 81   Resp 20   Wt 128 lb 11.2 oz (58.4 kg)   SpO2 96%   BMI 24.32 kg/m   Encounter Medications:   Outpatient Encounter Medications as of 05/14/2017  Medication Sig Note  . aspirin EC 81 MG tablet Take 81 mg by mouth daily.   Marland Kitchen atorvastatin (LIPITOR) 40 MG tablet Take 1 tablet (40 mg total) by mouth daily at 6 PM.   . Cholecalciferol (VITAMIN D PO) Take 1,000 Units by mouth daily.    . Cyanocobalamin (VITAMIN  B-12 PO) Take 1 tablet by mouth daily.   . furosemide (LASIX) 40 MG tablet Take 1 tablet (40 mg total) by mouth daily.   . iron polysaccharides (FERREX 150) 150 MG capsule Take 150 mg by mouth 2 (two) times daily.    Marland Kitchen levothyroxine (SYNTHROID, LEVOTHROID) 25 MCG tablet Take 25 mcg by mouth daily before breakfast.   . metoprolol tartrate (LOPRESSOR) 25 MG tablet Take 1 tablet (25 mg total) 2 (two) times daily by mouth.   . nitroGLYCERIN (NITROSTAT) 0.4 MG SL tablet Place 1 tablet (0.4 mg total) under the tongue every 5 (five) minutes as needed for chest pain. (Patient not taking: Reported on 05/07/2017) 05/07/2017: on hand  . Nutritional Supplements (FEEDING SUPPLEMENT, NEPRO CARB STEADY,) LIQD Take 237 mLs 2 (two) times daily between meals by mouth.   . pantoprazole (PROTONIX) 40 MG tablet Take 1 tablet (40 mg total) by mouth daily at 6 (six) AM.    No facility-administered encounter medications on file as of 05/14/2017.     Functional Status:   In your present state of health, do you have any difficulty performing the following activities: 03/08/2017 03/08/2017  Hearing? - Y  Vision? - N  Comment - -  Difficulty concentrating or making decisions? - N  Walking or climbing stairs? - Y  Comment - -  Dressing or bathing? - Y  Doing errands, shopping? Y Y  Comment - -  Preparing Food and eating ? - -  Using the Toilet? - -  In the past six months, have you accidently leaked urine? - -  Do you have problems with loss of bowel control? - -  Managing your Medications? - -  Managing your Finances? - -  Housekeeping or managing your Housekeeping? - -  Some recent data might be hidden    Fall/Depression Screening:    Fall Risk  02/18/2017  Falls in the past year? No   PHQ 2/9 Scores 02/18/2017 11/19/2012  PHQ - 2 Score 0 0    Assessment:    Met with member at scheduled time, granddaughter present during visit.  Member has remained stable over the past couple months, denies urgent concerns.   Member and granddaughter both educated on heart failure zones and when to contact MD, extra copy left and placed on refrigerator.  Discussed potential transition to health coach, she is agreeable.   Plan:   Will follow up next week regarding plan for surgery.  If remain stable, will transition to health coach.  THN CM Care Plan Problem One     Most Recent Value  Care Plan Problem One  Risk for fall related to deconditioning as evidenced by report of decreased strength  Role Documenting the Problem One  Care Management [place] for Problem One  Not Active  [facility] Long Term Goal   Member will not have any falls within the next 31 days  THN Long Term Goal Start Date  04/16/17  [facility] Long Term Goal Met Date  05/14/17  Interventions for Problem One Long Term Goal  Member and daughter educated on the importance of increasing strength and mobility in decreasing risk for fall.  Advised of importance of not only attending outpatient sessions, but also performing home exercises.  THN CM Short Term Goal #1   Member will report attending all outpatient rehab sessions over the next 4 years  [facility] CM Short Term Goal #1 Start Date  04/16/17  [facility] CM Short Term Goal #1 Met Date  05/14/17  Interventions for Short Term Goal #1  Reviewed upcoming appointments with member and daughter.  Confirmed member has transportation to appointments.  THN CM Short Term Goal #2   Member will perform home exercises at least once a day over the next 4 weeks  THN CM Short Term Goal #2 Start Date  04/16/17  [facility] CM Short Term Goal #2 Met Date  05/14/17  Interventions for Short Term Goal #2  Home exercises reviewed with member and daughter, demonstrations provided.     Valente David, [place], MSN [place] [phone]

## 2017-05-17 DIAGNOSIS — K649 Unspecified hemorrhoids: Secondary | ICD-10-CM | POA: Diagnosis not present

## 2017-05-24 ENCOUNTER — Other Ambulatory Visit: Payer: Self-pay | Admitting: *Deleted

## 2017-05-24 NOTE — Patient Outreach (Signed)
[facility] [facility]) Care Management  05/24/2017  Julie Lara November 01, 1920 [id number]   Call placed to member's daughter, [name], to follow up on surgery consult last week.  She report after further examination, member does not have prolapse but instead has hemrrhoids.  She state member was given medication, which is providing relief.  She denies any other concerns at this time.  Advised that this care manager will proceed with transition to health coach as previously discussed during home visit.  She verbalizes understanding.  Discipline closure letter sent to MD, referral placed to health coach.  Valente David, [place], MSN [place] [phone]

## 2017-06-06 ENCOUNTER — Other Ambulatory Visit: Payer: Self-pay

## 2017-06-06 NOTE — Patient Outreach (Signed)
[facility] [facility]) Care Management  06/06/2017  Julie Lara November 10, 1920 [id number]   1st outreach attempt to the patient. No answer. HIPAA compliant voicemail left with contact information.  Plan: RN Health Coach will make an outreach to the patient in one business day.  [name] RN, BSN, [place] Direct Dial:  [phone] Fax: [phone]

## 2017-06-07 ENCOUNTER — Ambulatory Visit: Payer: Self-pay

## 2017-06-10 ENCOUNTER — Other Ambulatory Visit: Payer: Self-pay

## 2017-06-10 NOTE — Patient Outreach (Signed)
[place] [facility]) Care Management  06/10/2017  Julie Lara July 24, 1920 [id number]

## 2017-06-10 NOTE — Patient Outreach (Signed)
[facility] [facility]) Care Management  06/10/2017  Julie Lara [birth year] [id number]   Successful outreach attempt to the patient for introduction call. Daughter Darryl Lent answered the phone.  She is on the designated party release and was able to provide HIPAA  Information.  Explained to [place] role and our next call.  Plan RN Health Coach will make an outreach attempt to the patient in two business days.  [name] RN, BSN, [place] Direct Dial:  [phone] Fax: [phone]

## 2017-06-11 DIAGNOSIS — I872 Venous insufficiency (chronic) (peripheral): Secondary | ICD-10-CM | POA: Diagnosis not present

## 2017-06-11 DIAGNOSIS — B359 Dermatophytosis, unspecified: Secondary | ICD-10-CM | POA: Diagnosis not present

## 2017-06-11 DIAGNOSIS — B351 Tinea unguium: Secondary | ICD-10-CM | POA: Diagnosis not present

## 2017-06-12 ENCOUNTER — Other Ambulatory Visit: Payer: Self-pay

## 2017-06-12 DIAGNOSIS — Z1389 Encounter for screening for other disorder: Secondary | ICD-10-CM | POA: Diagnosis not present

## 2017-06-12 DIAGNOSIS — E7849 Other hyperlipidemia: Secondary | ICD-10-CM | POA: Diagnosis not present

## 2017-06-12 DIAGNOSIS — M799 Soft tissue disorder, unspecified: Secondary | ICD-10-CM | POA: Diagnosis not present

## 2017-06-12 DIAGNOSIS — Z6824 Body mass index (BMI) 24.0-24.9, adult: Secondary | ICD-10-CM | POA: Diagnosis not present

## 2017-06-12 DIAGNOSIS — R410 Disorientation, unspecified: Secondary | ICD-10-CM | POA: Diagnosis not present

## 2017-06-12 DIAGNOSIS — I129 Hypertensive chronic kidney disease with stage 1 through stage 4 chronic kidney disease, or unspecified chronic kidney disease: Secondary | ICD-10-CM | POA: Diagnosis not present

## 2017-06-12 DIAGNOSIS — I5022 Chronic systolic (congestive) heart failure: Secondary | ICD-10-CM | POA: Diagnosis not present

## 2017-06-12 DIAGNOSIS — D6489 Other specified anemias: Secondary | ICD-10-CM | POA: Diagnosis not present

## 2017-06-12 DIAGNOSIS — Z811 Family history of alcohol abuse and dependence: Secondary | ICD-10-CM | POA: Diagnosis not present

## 2017-06-12 DIAGNOSIS — C801 Malignant (primary) neoplasm, unspecified: Secondary | ICD-10-CM | POA: Diagnosis not present

## 2017-06-12 DIAGNOSIS — N183 Chronic kidney disease, stage 3 (moderate): Secondary | ICD-10-CM | POA: Diagnosis not present

## 2017-06-12 DIAGNOSIS — I8310 Varicose veins of unspecified lower extremity with inflammation: Secondary | ICD-10-CM | POA: Diagnosis not present

## 2017-06-12 NOTE — Patient Outreach (Signed)
[facility]) Care Management  06/12/2017  Julie Lara [birth year] [id number]   2nd attempt to outreach the patient for initial assessment. Spoke with daughter Julie Lara and she states that the patient is not up yet and she has an appointment today and is unable to speak with me.  Plan: RN Health Coach will send letter for outreach attempt. RN Health Coach will make 3rd telephone outreach attempt in ten business days. No answer I will proceed with case closure.  [name] RN, BSN, [place] Direct Dial:  [phone] Fax: [phone]

## 2017-06-14 ENCOUNTER — Encounter: Payer: Self-pay | Admitting: Cardiology

## 2017-06-22 NOTE — Progress Notes (Signed)
Cardiology Office Note    Date:  06/22/2017   ID:  Julie Lara, DOB 1921-02-24, MRN [id number]  PCP:  Burnard Bunting, MD  Cardiologist:  Dr. Martinique  No chief complaint on file.   History of Present Illness:  Julie Lara is a 84 y.o. female with PMH of CAD (s/p BMS to RCA in 2014), PAF (not on systemic anticoagulation 2/2 GIB), moderate AI, hypothyroidism, and stage III CKD. She was admitted in April 2018 for evaluation chest pain. Heart rate was 100 at that time. Her Lopressor was increased to 25 mg twice a day. Serial troponin was negative. No further ischemic workup was pursued. She was readmitted  in November 2018 with CHF exacerbation. She was diuresed with IV Lasix. Troponin minimally elevated however had a flat trend. BNP was 940. She was in atrial fibrillation with RVR with heart rate of 120. Echocardiogram obtained on 02/02/2017 showed reduction of EF down to 35-40%, diffuse hypokinesis, moderate LVH, dilated ascending aorta at 43 mm, moderate AI, moderate MR, severely dilated left and right atrium, peak PA pressure 33 mmHg. Her discharge weight was up 127 pounds. She was taking 20 mg Lasix every other day due to renal insufficiency.   She was admitted in December 2018 with acute CHF. Diuresed with IV lasix. DC home on 40 mg daily. BNP elevated to 1583. CXR showed pulmonary edema. DC weight 127 lbs. Barium esophagram showed a stricture at the GE junction but she is asymptomatic from this.  On follow up today she is doing very well. She is turning 89 this week.  She denies any recent chest discomfort, shortness of breath, lower extremity edema, orthopnea or PND.tolerating medication well. Did note a rash on her legs when they were swollen but this has resolved.    Past Medical History:  Diagnosis Date  . Acute blood loss anemia    November 2014  . Aortic insufficiency    mild to moderate  . Arthritis    "right upper arm" (02/01/2017)  . Atrial fibrillation ([place])   . CAD  (coronary artery disease)   . Chest pain 04-2010   atypical with negative echocardiogram  . Dilated cardiomyopathy ([place])   . Goiter   . Hypothyroidism   . Idiopathic cardiomyopathy ([place]) 1999   EF 40% by Echo on 10/13/12  . NSTEMI (non-ST elevated myocardial infarction) [facility]) July 2014   95% prox RCA s/p BMS  . PAC (premature atrial contraction)    Chronic    Past Surgical History:  Procedure Laterality Date  . biopsy of sacral lesion  2010  . CARDIAC CATHETERIZATION    . COLONOSCOPY N/A 02/23/2013   Procedure: COLONOSCOPY;  Surgeon: Ladene Artist, MD;  Location: [facility] ENDOSCOPY;  Service: Endoscopy;  Laterality: N/A;  . CORONARY ANGIOPLASTY WITH STENT PLACEMENT  09/2012   BMS-95% prox RCA  . ESOPHAGOGASTRODUODENOSCOPY N/A 02/22/2013   Procedure: ESOPHAGOGASTRODUODENOSCOPY (EGD);  Surgeon: Milus Banister, MD;  Location: [place];  Service: Endoscopy;  Laterality: N/A;  . LEFT HEART CATHETERIZATION WITH CORONARY ANGIOGRAM N/A 10/14/2012   Procedure: LEFT HEART CATHETERIZATION WITH CORONARY ANGIOGRAM;  Surgeon: Wellington Hampshire, MD;  Location: [place] CATH LAB;  Service: Cardiovascular;  Laterality: N/A;  . PERCUTANEOUS CORONARY STENT INTERVENTION (PCI-S) Right 10/14/2012   Procedure: PERCUTANEOUS CORONARY STENT INTERVENTION (PCI-S);  Surgeon: Wellington Hampshire, MD;  Location: [facility] C[facility] LAB;  Service: Cardiovascular;  Laterality: Right;  . THYROIDECTOMY  2008  . VAGINAL HYSTERECTOMY     Complete  Current Medications: Outpatient Medications Prior to Visit  Medication Sig Dispense Refill  . aspirin EC 81 MG tablet Take 81 mg by mouth daily.    Marland Kitchen atorvastatin (LIPITOR) 40 MG tablet Take 1 tablet (40 mg total) by mouth daily at 6 PM. 90 tablet 3  . Cholecalciferol (VITAMIN D PO) Take 1,000 Units by mouth daily.     . Cyanocobalamin (VITAMIN B-12 PO) Take 1 tablet by mouth daily.    . furosemide (LASIX) 40 MG tablet Take 1 tablet (40 mg total) by mouth daily. 90 tablet 1  . iron  polysaccharides (FERREX 150) 150 MG capsule Take 150 mg by mouth 2 (two) times daily.     Marland Kitchen levothyroxine (SYNTHROID, LEVOTHROID) 25 MCG tablet Take 25 mcg by mouth daily before breakfast.    . metoprolol tartrate (LOPRESSOR) 25 MG tablet Take 1 tablet (25 mg total) 2 (two) times daily by mouth. 90 tablet 3  . nitroGLYCERIN (NITROSTAT) 0.4 MG SL tablet Place 1 tablet (0.4 mg total) under the tongue every 5 (five) minutes as needed for chest pain. (Patient not taking: Reported on 05/07/2017) 25 tablet 3  . Nutritional Supplements (FEEDING SUPPLEMENT, NEPRO CARB STEADY,) LIQD Take 237 mLs 2 (two) times daily between meals by mouth.    . pantoprazole (PROTONIX) 40 MG tablet Take 1 tablet (40 mg total) by mouth daily at 6 (six) AM. 30 tablet 0   No facility-administered medications prior to visit.      Allergies:   Patient has no known allergies.   Social History   Socioeconomic History  . Marital status: Widowed    Spouse name: Not on file  . Number of children: Not on file  . Years of education: Not on file  . Highest education level: Not on file  Occupational History  . Occupation: Retired    Fish farm manager: [employer]  . Financial resource strain: Not on file  . Food insecurity:    Worry: Not on file    Inability: Not on file  . Transportation needs:    Medical: Not on file    Non-medical: Not on file  Tobacco Use  . Smoking status: Never Smoker  . Smokeless tobacco: Never Used  Substance and Sexual Activity  . Alcohol use: No  . Drug use: No  . Sexual activity: Not Currently  Lifestyle  . Physical activity:    Days per week: Not on file    Minutes per session: Not on file  . Stress: Not on file  Relationships  . Social connections:    Talks on phone: Not on file    Gets together: Not on file    Attends religious service: Not on file    Active member of club or organization: Not on file    Attends meetings of clubs or organizations: Not on file     Relationship status: Not on file  Other Topics Concern  . Not on file  Social History Narrative   Pt lives alone, but has family close by.     Family History:  The patient's family history includes Diabetes in her sister.   ROS:   Please see the history of present illness.    ROS All other systems reviewed and are negative.   PHYSICAL EXAM:   VS:  There were no vitals taken for this visit.   GENERAL:  Well appearing elderly WF in NAD HEENT:  PERRL, EOMI, sclera are clear. Oropharynx is clear. NECK:  No jugular  venous distention, carotid upstroke brisk and symmetric, no bruits, no thyromegaly or adenopathy LUNGS:  Clear to auscultation bilaterally CHEST:  Unremarkable HEART:  IRRR,  PMI not displaced or sustained,S1 and S2 within normal limits, no S3, no S4: no clicks, no rubs, gr 1/6 systolic murmur. ABD:  Soft, nontender. BS +, no masses or bruits. No hepatomegaly, no splenomegaly EXT:  2 + pulses throughout, no edema, no cyanosis no clubbing SKIN:  Warm and dry.  No rashes NEURO:  Alert and oriented x 3. Cranial nerves II through XII intact. PSYCH:  Cognitively intact     Wt Readings from Last 3 Encounters:  05/14/17 128 lb 11.2 oz (58.4 kg)  05/07/17 131 lb 4 oz (59.5 kg)  04/16/17 127 lb (57.6 kg)      Studies/Labs Reviewed:   EKG:  EKG is ordered today.  The ekg ordered today demonstrates atrial fibrillation, heart rate 97  Recent Labs: 07/18/2016: TSH 2.779 02/01/2017: ALT 31 03/07/2017: B Natriuretic Peptide 1,582.9 03/08/2017: Magnesium 2.2 03/09/2017: BUN 27; Creatinine, Ser 1.35; Hemoglobin 10.7; Platelets 154; Potassium 3.5; Sodium 138   Lipid Panel    Component Value Date/Time   CHOL 146 12/15/2012 1552   TRIG 54.0 12/15/2012 1552   HDL 64.10 12/15/2012 1552   CHOLHDL 2 12/15/2012 1552   VLDL 10.8 12/15/2012 1552   LDLCALC 71 12/15/2012 1552   Labs dated 12/19/16: cholesterol 150, triglycerides 77, HDL 64, LDL 71.  Additional studies/ records that  were reviewed today include:   Echo 02/02/2017 LV EF: 35% -   40%  Study Conclusions  - Left ventricle: The cavity size was normal. Wall thickness was   increased in a pattern of moderate LVH. Systolic function was   moderately reduced. The estimated ejection fraction was in the   range of 35% to 40%. Diffuse hypokinesis. The study is not   technically sufficient to allow evaluation of LV diastolic   function. - Aortic valve: Trileaflet; mildly calcified leaflets. There was   moderate regurgitation. - Aorta: Ascending aortic diameter: 43 mm (S). - Ascending aorta: The ascending aorta was mildly dilated. - Mitral valve: Mildly thickened leaflets . There was moderate   regurgitation. - Left atrium: Severely dilated. - Right ventricle: The cavity size was mildly dilated. Systolic   function is mildly reduced. - Right atrium: Severely dilated. - Atrial septum: Bows from left to right, suggesting of high LA >   RA filling pressure. - Tricuspid valve: There was mild regurgitation. - Pulmonary arteries: PA peak pressure: 33 mm Hg (S). - Inferior vena cava: The vessel was dilated. The respirophasic   diameter changes were blunted (< 50%), consistent with elevated   central venous pressure.  Impressions:  - Compared to a prior study in 2014, the LVEF is lower at 35-40%   with diffuse hypokinesis, moderate AI and MR, mild TR, severe   biatrial enlargement. Rhythm appears to be atrial fibrillation.   ASSESSMENT:    No diagnosis found.   PLAN:  In order of problems listed above:  1. CAD: asymptomatic, continue aspirin, Lipitor, and metoprolol.  2. Persistent atrial fibrillation/ permanent: Her metoprolol was previously reduced back in May 2018 due to significant bradycardia, recently increased again to 25 mg twice a day due to elevated heart rate. Her heart rate is stable today.  3. Chronic systolic heart failure: EF 35-40% on recent echocardiogram. On Lasix 40 mg daily. No  volume overload today. Will check BMET and BNP today,.  4. Moderate aortic regurgitation:  Stable, continue observation.  5. Hypothyroidism: On Synthroid, managed by primary care provider  6. CKD stage III: Obtain basic metabolic panel today    Medication Adjustments/Labs and Tests Ordered: Current medicines are reviewed at length with the patient today.  Concerns regarding medicines are outlined above.  Medication changes, Labs and Tests ordered today are listed in the Patient Instructions below. There are no Patient Instructions on file for this visit.   Signed, Peter Martinique, MD  06/22/2017 1:51 PM    [place]

## 2017-06-24 ENCOUNTER — Other Ambulatory Visit: Payer: Self-pay

## 2017-06-24 ENCOUNTER — Encounter: Payer: Self-pay | Admitting: Cardiology

## 2017-06-24 ENCOUNTER — Ambulatory Visit (INDEPENDENT_AMBULATORY_CARE_PROVIDER_SITE_OTHER): Payer: Medicare Other | Admitting: Cardiology

## 2017-06-24 VITALS — BP 108/70 | HR 52 | Ht 61.0 in | Wt 131.0 lb

## 2017-06-24 DIAGNOSIS — I252 Old myocardial infarction: Secondary | ICD-10-CM

## 2017-06-24 DIAGNOSIS — I351 Nonrheumatic aortic (valve) insufficiency: Secondary | ICD-10-CM | POA: Diagnosis not present

## 2017-06-24 DIAGNOSIS — N183 Chronic kidney disease, stage 3 unspecified: Secondary | ICD-10-CM

## 2017-06-24 DIAGNOSIS — I5022 Chronic systolic (congestive) heart failure: Secondary | ICD-10-CM

## 2017-06-24 DIAGNOSIS — I2510 Atherosclerotic heart disease of native coronary artery without angina pectoris: Secondary | ICD-10-CM

## 2017-06-24 NOTE — Patient Outreach (Signed)
[facility]) Care Management  06/24/2017  SMT LOKEY [birth year] [id number]   3rd outreach attempt to the patient for initial assessment. No answer.  Unable to leave a message the mailbox was full.  Plan: RN Health Coach will close the case du to unable to reach the patient .  [name] RN, BSN, [place] Direct Dial:  [phone]  Fax: [phone]

## 2017-06-24 NOTE — Patient Instructions (Signed)
We will check blood work today  Continue your current therapy  I will see you in 4 months  

## 2017-06-24 NOTE — Addendum Note (Signed)
Addended by: Kathyrn Lass on: 06/24/2017 02:33 PM   Modules accepted: Orders

## 2017-06-25 LAB — BASIC METABOLIC PANEL
BUN / CREAT RATIO: 22 (ref 12–28)
BUN: 33 mg/dL (ref 10–36)
CALCIUM: 9.2 mg/dL (ref 8.7–10.3)
CHLORIDE: 100 mmol/L (ref 96–106)
CO2: 27 mmol/L (ref 20–29)
Creatinine, Ser: 1.48 mg/dL — ABNORMAL HIGH (ref 0.57–1.00)
GFR calc non Af Amer: 30 mL/min/{1.73_m2} — ABNORMAL LOW (ref >59)
GFR, EST AFRICAN AMERICAN: 35 mL/min/{1.73_m2} — AB (ref >59)
Glucose: 79 mg/dL (ref 65–99)
POTASSIUM: 3.9 mmol/L (ref 3.5–5.2)
Sodium: 143 mmol/L (ref 134–144)

## 2017-06-25 LAB — BRAIN NATRIURETIC PEPTIDE: BNP: 610.1 pg/mL — ABNORMAL HIGH (ref 0.0–100.0)

## 2017-07-08 DIAGNOSIS — H6121 Impacted cerumen, right ear: Secondary | ICD-10-CM | POA: Diagnosis not present

## 2017-07-08 DIAGNOSIS — Z6824 Body mass index (BMI) 24.0-24.9, adult: Secondary | ICD-10-CM | POA: Diagnosis not present

## 2017-07-08 DIAGNOSIS — J3089 Other allergic rhinitis: Secondary | ICD-10-CM | POA: Diagnosis not present

## 2017-07-16 DIAGNOSIS — I872 Venous insufficiency (chronic) (peripheral): Secondary | ICD-10-CM | POA: Diagnosis not present

## 2017-07-16 DIAGNOSIS — D692 Other nonthrombocytopenic purpura: Secondary | ICD-10-CM | POA: Diagnosis not present

## 2017-08-12 ENCOUNTER — Other Ambulatory Visit: Payer: Self-pay | Admitting: Cardiology

## 2017-08-12 NOTE — Telephone Encounter (Signed)
Rx has been sent to the pharmacy electronically. ° °

## 2017-08-20 ENCOUNTER — Telehealth: Payer: Self-pay

## 2017-08-20 MED ORDER — FUROSEMIDE 40 MG PO TABS: 40.0000 mg | ORAL_TABLET | Freq: Every day | ORAL | 3 refills | Status: DC

## 2017-08-20 NOTE — Telephone Encounter (Signed)
Received a call from patient's daughter [name].She stated she went to pick up lasix and she received wrong mg tablet.She received 20 mg.Stated mother takes 40 mg daily.Advised our office did not send in lasix prescription.Last refill 04/2017.Advised I will send in refill.

## 2017-09-28 ENCOUNTER — Other Ambulatory Visit: Payer: Self-pay | Admitting: Cardiology

## 2017-10-08 NOTE — Progress Notes (Signed)
Cardiology Office Note    Date:  10/09/2017   ID:  Julie Lara, DOB 29-Jul-1920, MRN [id number]  PCP:  Burnard Bunting, MD  Cardiologist:  Dr. Martinique  Chief Complaint  Patient presents with  . Atrial Fibrillation  . Coronary Artery Disease    History of Present Illness:  Julie Lara is a 84 y.o. female with PMH of CAD (s/p BMS to RCA in 2014), PAF (not on systemic anticoagulation 2/2 GIB), moderate AI, hypothyroidism, and stage III CKD. She was admitted in April 2018 for evaluation chest pain. Heart rate was 100 at that time. Her Lopressor was increased to 25 mg twice a day. Serial troponin was negative. No further ischemic workup was pursued. She was readmitted  in November 2018 with CHF exacerbation. She was diuresed with IV Lasix. Troponin minimally elevated however had a flat trend. BNP was 940. She was in atrial fibrillation with RVR with heart rate of 120. Echocardiogram obtained on 02/02/2017 showed reduction of EF down to 35-40%, diffuse hypokinesis, moderate LVH, dilated ascending aorta at 43 mm, moderate AI, moderate MR, severely dilated left and right atrium, peak PA pressure 33 mmHg. Her discharge weight was up 127 pounds. She was taking 20 mg Lasix every other day due to renal insufficiency.   She was admitted in December 2018 with acute CHF. Diuresed with IV lasix. DC home on 40 mg daily. BNP elevated to 1583. CXR showed pulmonary edema. DC weight 127 lbs. Barium esophagram showed a stricture at the GE junction but she is asymptomatic from this.  On follow up today she is doing very well. She is 84 years old.  She denies any  chest discomfort, shortness of breath, lower extremity edema, orthopnea or PND at this time. She did have swelling and redness of her feet earlier but this has resolved. Only notes SOB if she overdoes it. Does a little work in the house and in the yard.    Past Medical History:  Diagnosis Date  . Acute blood loss anemia    November 2014  .  Aortic insufficiency    mild to moderate  . Arthritis    "right upper arm" (02/01/2017)  . Atrial fibrillation ([place])   . CAD (coronary artery disease)   . Chest pain 04-2010   atypical with negative echocardiogram  . Dilated cardiomyopathy ([place])   . Goiter   . Hypothyroidism   . Idiopathic cardiomyopathy ([place]) 1999   EF 40% by Echo on 10/13/12  . NSTEMI (non-ST elevated myocardial infarction) [facility]) July 2014   95% prox RCA s/p BMS  . PAC (premature atrial contraction)    Chronic    Past Surgical History:  Procedure Laterality Date  . biopsy of sacral lesion  2010  . CARDIAC CATHETERIZATION    . COLONOSCOPY N/A 02/23/2013   Procedure: COLONOSCOPY;  Surgeon: Ladene Artist, MD;  Location: [facility] ENDOSCOPY;  Service: Endoscopy;  Laterality: N/A;  . CORONARY ANGIOPLASTY WITH STENT PLACEMENT  09/2012   BMS-95% prox RCA  . ESOPHAGOGASTRODUODENOSCOPY N/A 02/22/2013   Procedure: ESOPHAGOGASTRODUODENOSCOPY (EGD);  Surgeon: Milus Banister, MD;  Location: [place];  Service: Endoscopy;  Laterality: N/A;  . LEFT HEART CATHETERIZATION WITH CORONARY ANGIOGRAM N/A 10/14/2012   Procedure: LEFT HEART CATHETERIZATION WITH CORONARY ANGIOGRAM;  Surgeon: Wellington Hampshire, MD;  Location: [place] CATH LAB;  Service: Cardiovascular;  Laterality: N/A;  . PERCUTANEOUS CORONARY STENT INTERVENTION (PCI-S) Right 10/14/2012   Procedure: PERCUTANEOUS CORONARY STENT INTERVENTION (PCI-S);  Surgeon: Mertie Clause  Fletcher Anon, MD;  Location: [place] CATH LAB;  Service: Cardiovascular;  Laterality: Right;  . THYROIDECTOMY  2008  . VAGINAL HYSTERECTOMY     Complete    Current Medications: Outpatient Medications Prior to Visit  Medication Sig Dispense Refill  . aspirin EC 81 MG tablet Take 81 mg by mouth daily.    Marland Kitchen atorvastatin (LIPITOR) 40 MG tablet TAKE 1 TABLET DAILY AT 6PM 90 tablet 3  . Cholecalciferol (VITAMIN D PO) Take 1,000 Units by mouth daily.     . Cyanocobalamin (VITAMIN B-12 PO) Take 1 tablet by mouth daily.    .  furosemide (LASIX) 40 MG tablet Take 1 tablet (40 mg total) by mouth daily. 90 tablet 3  . iron polysaccharides (FERREX 150) 150 MG capsule Take 150 mg by mouth 2 (two) times daily.     Marland Kitchen levothyroxine (SYNTHROID, LEVOTHROID) 25 MCG tablet Take 25 mcg by mouth daily before breakfast.    . metoprolol tartrate (LOPRESSOR) 25 MG tablet TAKE 1 TABLET (25 MG TOTAL) 2 (TWO) TIMES DAILY BY MOUTH 180 tablet 1  . nitroGLYCERIN (NITROSTAT) 0.4 MG SL tablet Place 1 tablet (0.4 mg total) under the tongue every 5 (five) minutes as needed for chest pain. 25 tablet 3  . Nutritional Supplements (FEEDING SUPPLEMENT, NEPRO CARB STEADY,) LIQD Take 237 mLs 2 (two) times daily between meals by mouth.    . pantoprazole (PROTONIX) 40 MG tablet Take 1 tablet (40 mg total) by mouth daily at 6 (six) AM. 30 tablet 0   No facility-administered medications prior to visit.      Allergies:   Patient has no known allergies.   Social History   Socioeconomic History  . Marital status: Widowed    Spouse name: Not on file  . Number of children: Not on file  . Years of education: Not on file  . Highest education level: Not on file  Occupational History  . Occupation: Retired    Fish farm manager: [employer]  . Financial resource strain: Not on file  . Food insecurity:    Worry: Not on file    Inability: Not on file  . Transportation needs:    Medical: Not on file    Non-medical: Not on file  Tobacco Use  . Smoking status: Never Smoker  . Smokeless tobacco: Never Used  Substance and Sexual Activity  . Alcohol use: No  . Drug use: No  . Sexual activity: Not Currently  Lifestyle  . Physical activity:    Days per week: Not on file    Minutes per session: Not on file  . Stress: Not on file  Relationships  . Social connections:    Talks on phone: Not on file    Gets together: Not on file    Attends religious service: Not on file    Active member of club or organization: Not on file    Attends  meetings of clubs or organizations: Not on file    Relationship status: Not on file  Other Topics Concern  . Not on file  Social History Narrative   Pt lives alone, but has family close by.     Family History:  The patient's family history includes Diabetes in her sister.   ROS:   Please see the history of present illness.    ROS All other systems reviewed and are negative.   PHYSICAL EXAM:   VS:  BP 125/68   Pulse (!) 55   Ht 5\' 1"  (1.549 m)  Wt 131 lb 3.2 oz (59.5 kg)   BMI 24.79 kg/m    GENERAL:  Well appearing elderly WF in NAD HEENT:  PERRL, EOMI, sclera are clear. Oropharynx is clear. NECK:  No jugular venous distention, carotid upstroke brisk and symmetric, no bruits, no thyromegaly or adenopathy LUNGS:  Clear to auscultation bilaterally CHEST:  Unremarkable HEART:  RRR,  PMI not displaced or sustained,S1 and S2 within normal limits, no S3, no S4: no clicks, no rubs, no murmurs ABD:  Soft, nontender. BS +, no masses or bruits. No hepatomegaly, no splenomegaly EXT:  2 + pulses throughout, no edema, no cyanosis no clubbing SKIN:  Warm and dry.  No rashes NEURO:  Alert and oriented x 3. Cranial nerves II through XII intact. PSYCH:  Cognitively intact       Wt Readings from Last 3 Encounters:  10/09/17 131 lb 3.2 oz (59.5 kg)  06/24/17 131 lb (59.4 kg)  05/14/17 128 lb 11.2 oz (58.4 kg)      Studies/Labs Reviewed:   EKG:  EKG is not ordered today.    Recent Labs: 02/01/2017: ALT 31 03/08/2017: Magnesium 2.2 03/09/2017: Hemoglobin 10.7; Platelets 154 06/24/2017: BNP 610.1; BUN 33; Creatinine, Ser 1.48; Potassium 3.9; Sodium 143   Lipid Panel    Component Value Date/Time   CHOL 146 12/15/2012 1552   TRIG 54.0 12/15/2012 1552   HDL 64.10 12/15/2012 1552   CHOLHDL 2 12/15/2012 1552   VLDL 10.8 12/15/2012 1552   LDLCALC 71 12/15/2012 1552   Labs dated 12/19/16: cholesterol 150, triglycerides 77, HDL 64, LDL 71.  Additional studies/ records that were  reviewed today include:   Echo 02/02/2017 LV EF: 35% -   40%  Study Conclusions  - Left ventricle: The cavity size was normal. Wall thickness was   increased in a pattern of moderate LVH. Systolic function was   moderately reduced. The estimated ejection fraction was in the   range of 35% to 40%. Diffuse hypokinesis. The study is not   technically sufficient to allow evaluation of LV diastolic   function. - Aortic valve: Trileaflet; mildly calcified leaflets. There was   moderate regurgitation. - Aorta: Ascending aortic diameter: 43 mm (S). - Ascending aorta: The ascending aorta was mildly dilated. - Mitral valve: Mildly thickened leaflets . There was moderate   regurgitation. - Left atrium: Severely dilated. - Right ventricle: The cavity size was mildly dilated. Systolic   function is mildly reduced. - Right atrium: Severely dilated. - Atrial septum: Bows from left to right, suggesting of high LA >   RA filling pressure. - Tricuspid valve: There was mild regurgitation. - Pulmonary arteries: PA peak pressure: 33 mm Hg (S). - Inferior vena cava: The vessel was dilated. The respirophasic   diameter changes were blunted (< 50%), consistent with elevated   central venous pressure.  Impressions:  - Compared to a prior study in 2014, the LVEF is lower at 35-40%   with diffuse hypokinesis, moderate AI and MR, mild TR, severe   biatrial enlargement. Rhythm appears to be atrial fibrillation.   ASSESSMENT:    1. Chronic systolic congestive heart failure ([place])   2. CKD (chronic kidney disease), stage III ([place])   3. Moderate aortic regurgitation   4. Coronary artery disease with history of myocardial infarction without history of CABG   5. Persistent atrial fibrillation (HCC)      PLAN:  In order of problems listed above:  1. CAD: asymptomatic, continue aspirin, Lipitor, and metoprolol.  2.  Persistent atrial fibrillation/ permanent: HR is well controlled on Toprol. Not on  anticoagulation due to history of GI bleed.   3. Chronic systolic heart failure: EF 35-40% on recent echocardiogram. On Lasix 40 mg daily. No volume overload today.   4. Moderate aortic regurgitation: Stable, continue observation.  5. Hypothyroidism: On Synthroid, managed by primary care provider  6. CKD stage III    Medication Adjustments/Labs and Tests Ordered: Current medicines are reviewed at length with the patient today.  Concerns regarding medicines are outlined above.  Medication changes, Labs and Tests ordered today are listed in the Patient Instructions below. Patient Instructions  Continue your current therapy  Restrict salt intake.  I will see you in 6 months      Signed, Harold Moncus Martinique, MD  10/09/2017 2:17 PM    [facility]

## 2017-10-09 ENCOUNTER — Encounter: Payer: Self-pay | Admitting: Cardiology

## 2017-10-09 ENCOUNTER — Ambulatory Visit (INDEPENDENT_AMBULATORY_CARE_PROVIDER_SITE_OTHER): Payer: Medicare Other | Admitting: Cardiology

## 2017-10-09 VITALS — BP 125/68 | HR 55 | Ht 61.0 in | Wt 131.2 lb

## 2017-10-09 DIAGNOSIS — I481 Persistent atrial fibrillation: Secondary | ICD-10-CM | POA: Diagnosis not present

## 2017-10-09 DIAGNOSIS — N183 Chronic kidney disease, stage 3 unspecified: Secondary | ICD-10-CM

## 2017-10-09 DIAGNOSIS — I2510 Atherosclerotic heart disease of native coronary artery without angina pectoris: Secondary | ICD-10-CM

## 2017-10-09 DIAGNOSIS — I351 Nonrheumatic aortic (valve) insufficiency: Secondary | ICD-10-CM

## 2017-10-09 DIAGNOSIS — I4819 Other persistent atrial fibrillation: Secondary | ICD-10-CM

## 2017-10-09 DIAGNOSIS — I252 Old myocardial infarction: Secondary | ICD-10-CM

## 2017-10-09 DIAGNOSIS — I5022 Chronic systolic (congestive) heart failure: Secondary | ICD-10-CM

## 2017-10-09 NOTE — Patient Instructions (Addendum)
Continue your current therapy  Restrict salt intake.  I will see you in 6 months

## 2017-11-19 NOTE — Telephone Encounter (Signed)
Chart opened in error

## 2017-11-22 ENCOUNTER — Other Ambulatory Visit: Payer: Self-pay

## 2017-11-22 ENCOUNTER — Emergency Department (HOSPITAL_COMMUNITY)
Admission: EM | Admit: 2017-11-22 | Discharge: 2017-11-23 | Disposition: A | Discharge status: Home / Self Care | Payer: Medicare Other | Attending: Emergency Medicine | Admitting: Emergency Medicine

## 2017-11-22 ENCOUNTER — Encounter (HOSPITAL_COMMUNITY): Payer: Self-pay | Admitting: *Deleted

## 2017-11-22 DIAGNOSIS — L03114 Cellulitis of left upper limb: Secondary | ICD-10-CM

## 2017-11-22 DIAGNOSIS — E039 Hypothyroidism, unspecified: Secondary | ICD-10-CM | POA: Insufficient documentation

## 2017-11-22 DIAGNOSIS — I252 Old myocardial infarction: Secondary | ICD-10-CM | POA: Diagnosis not present

## 2017-11-22 DIAGNOSIS — I2510 Atherosclerotic heart disease of native coronary artery without angina pectoris: Secondary | ICD-10-CM | POA: Insufficient documentation

## 2017-11-22 DIAGNOSIS — S51852A Open bite of left forearm, initial encounter: Secondary | ICD-10-CM | POA: Diagnosis not present

## 2017-11-22 DIAGNOSIS — I130 Hypertensive heart and chronic kidney disease with heart failure and stage 1 through stage 4 chronic kidney disease, or unspecified chronic kidney disease: Secondary | ICD-10-CM | POA: Diagnosis not present

## 2017-11-22 DIAGNOSIS — E1122 Type 2 diabetes mellitus with diabetic chronic kidney disease: Secondary | ICD-10-CM | POA: Insufficient documentation

## 2017-11-22 DIAGNOSIS — Z7982 Long term (current) use of aspirin: Secondary | ICD-10-CM | POA: Diagnosis not present

## 2017-11-22 DIAGNOSIS — I5022 Chronic systolic (congestive) heart failure: Secondary | ICD-10-CM | POA: Diagnosis not present

## 2017-11-22 DIAGNOSIS — W5501XA Bitten by cat, initial encounter: Secondary | ICD-10-CM | POA: Insufficient documentation

## 2017-11-22 DIAGNOSIS — N183 Chronic kidney disease, stage 3 (moderate): Secondary | ICD-10-CM | POA: Insufficient documentation

## 2017-11-22 DIAGNOSIS — Z79899 Other long term (current) drug therapy: Secondary | ICD-10-CM | POA: Insufficient documentation

## 2017-11-22 DIAGNOSIS — R2232 Localized swelling, mass and lump, left upper limb: Secondary | ICD-10-CM | POA: Diagnosis present

## 2017-11-22 LAB — CBC
HEMATOCRIT: 37.4 % (ref 36.0–46.0)
HEMOGLOBIN: 11.9 g/dL — AB (ref 12.0–15.0)
MCH: 30.6 pg (ref 26.0–34.0)
MCHC: 31.8 g/dL (ref 30.0–36.0)
MCV: 96.1 fL (ref 78.0–100.0)
Platelets: 143 10*3/uL — ABNORMAL LOW (ref 150–400)
RBC: 3.89 MIL/uL (ref 3.87–5.11)
RDW: 13.6 % (ref 11.5–15.5)
WBC: 9.2 10*3/uL (ref 4.0–10.5)

## 2017-11-22 LAB — I-STAT CG4 LACTIC ACID, ED: Lactic Acid, Venous: 1.6 mmol/L (ref 0.5–1.9)

## 2017-11-22 NOTE — ED Triage Notes (Signed)
The pt is c/o lt forearm cat bite that occurred 2 days ago redness and swelliong and hot.  No temp  Minimal pain

## 2017-11-23 DIAGNOSIS — L03114 Cellulitis of left upper limb: Secondary | ICD-10-CM | POA: Diagnosis not present

## 2017-11-23 MED ORDER — AMOXICILLIN-POT CLAVULANATE 875-125 MG PO TABS: 1.0000 | ORAL_TABLET | Freq: Two times a day (BID) | ORAL | 0 refills | Status: DC

## 2017-11-23 MED ORDER — SODIUM CHLORIDE 0.9 % IV SOLN
3.0000 g | Freq: Once | INTRAVENOUS | Status: AC
  Administered 2017-11-23: 3 g via INTRAVENOUS
  Filled 2017-11-23: qty 3

## 2017-11-23 NOTE — Discharge Instructions (Signed)
Take Augmentin as prescribed until finished.  We recommend Tylenol as needed for management of discomfort.  Try and keep your hand elevated as much as possible, above the level of your heart.  You should have your wound rechecked by your primary care doctor.  Should you experience worsening or spreading redness, fever over 100.4 F, increasing pain, puslike drainage from your wound, or other concerning symptoms, promptly return to the emergency department for repeat evaluation.

## 2017-11-23 NOTE — ED Provider Notes (Signed)
[facility] EMERGENCY DEPARTMENT Provider Note   CSN: [id number] Arrival date & time: 11/22/17  2123     History   Chief Complaint Chief Complaint  Patient presents with  . Animal Bite    HPI Julie Lara is a 84 y.o. female.  84 year old female with hx of cardiomyopathy (LVEF 35-40%), hypothyroid, CAD, and Afib (not on chronic anticoagulation) presents to the emergency department for evaluation of redness and swelling to her left forearm and wrist.  She states that her cat "nipped" at her while it was sleeping.  Had a puncture wound to her distal forearm and has had progressive redness and swelling since the incident occurred 2 days ago.  Denies any significant pain to the area.  No associated fevers.  Reports this happened once in the past and resolved without intervention.  Believes her Tdap is UTD.  Cat is UTD on vaccinations.     Past Medical History:  Diagnosis Date  . Acute blood loss anemia    November 2014  . Aortic insufficiency    mild to moderate  . Arthritis    "right upper arm" (02/01/2017)  . Atrial fibrillation ([place])   . CAD (coronary artery disease)   . Chest pain 04-2010   atypical with negative echocardiogram  . Dilated cardiomyopathy ([place])   . Goiter   . Hypothyroidism   . Idiopathic cardiomyopathy ([place]) 1999   EF 40% by Echo on 10/13/12  . NSTEMI (non-ST elevated myocardial infarction) [facility]) July 2014   95% prox RCA s/p BMS  . PAC (premature atrial contraction)    Chronic    Patient Active Problem List   Diagnosis Date Noted  . Essential hypertension 02/11/2017  . Acute on chronic systolic CHF (congestive heart failure) ([place]) 02/01/2017  . HLD (hyperlipidemia) 02/01/2017  . Atrial fibrillation with RVR ([place])   . Chest pain, rule out acute myocardial infarction 07/18/2016  . Acute blood loss anemia 03/12/2013  . CAD (coronary artery disease)   . Benign neoplasm of colon 02/23/2013  . Nonspecific abnormal finding in stool  contents 02/23/2013  . Near syncope 02/21/2013  . Anemia 02/21/2013  . Acute on chronic renal failure ([place]) 02/21/2013  . Chronic systolic congestive heart failure ([place]) 02/21/2013  . GI bleed 02/21/2013  . Chronic anticoagulation 02/21/2013  . Coronary artery disease with history of myocardial infarction without history of CABG 02/21/2013  . Resting chest pain 10/27/2012  . Hypothyroidism 10/15/2012  . Atrial fibrillation ([place]) 10/15/2012  . CKD (chronic kidney disease), stage III ([place]) 10/15/2012  . UTI (urinary tract infection) 10/15/2012  . Dilated cardiomyopathy ([place])   . Aortic insufficiency   . PAC (premature atrial contraction)   . Chest pain 04/02/2010    Past Surgical History:  Procedure Laterality Date  . biopsy of sacral lesion  2010  . CARDIAC CATHETERIZATION    . COLONOSCOPY N/A 02/23/2013   Procedure: COLONOSCOPY;  Surgeon: Ladene Artist, MD;  Location: [facility] ENDOSCOPY;  Service: Endoscopy;  Laterality: N/A;  . CORONARY ANGIOPLASTY WITH STENT PLACEMENT  09/2012   BMS-95% prox RCA  . ESOPHAGOGASTRODUODENOSCOPY N/A 02/22/2013   Procedure: ESOPHAGOGASTRODUODENOSCOPY (EGD);  Surgeon: Milus Banister, MD;  Location: [facility];  Service: Endoscopy;  Laterality: N/A;  . LEFT HEART CATHETERIZATION WITH CORONARY ANGIOGRAM N/A 10/14/2012   Procedure: LEFT HEART CATHETERIZATION WITH CORONARY ANGIOGRAM;  Surgeon: Wellington Hampshire, MD;  Location: [facility] CATH LAB;  Service: Cardiovascular;  Laterality: N/A;  . PERCUTANEOUS CORONARY STENT INTERVENTION (PCI-S) Right  10/14/2012   Procedure: PERCUTANEOUS CORONARY STENT INTERVENTION (PCI-S);  Surgeon: Wellington Hampshire, MD;  Location: [facility] CATH LAB;  Service: Cardiovascular;  Laterality: Right;  . THYROIDECTOMY  2008  . VAGINAL HYSTERECTOMY     Complete     OB History   None      Home Medications    Prior to Admission medications   Medication Sig Start Date End Date Taking? Authorizing Provider  amoxicillin-clavulanate  (AUGMENTIN) 875-125 MG tablet Take 1 tablet by mouth every 12 (twelve) hours. 11/23/17   Antonietta Breach, PA-C  aspirin EC 81 MG tablet Take 81 mg by mouth daily.    [provider]  atorvastatin (LIPITOR) 40 MG tablet TAKE 1 TABLET DAILY AT 6PM 08/12/17   Martinique, Peter M, MD  Cholecalciferol (VITAMIN D PO) Take 1,000 Units by mouth daily.     [provider]  Cyanocobalamin (VITAMIN B-12 PO) Take 1 tablet by mouth daily.    [provider]  furosemide (LASIX) 40 MG tablet Take 1 tablet (40 mg total) by mouth daily. 08/20/17   Martinique, Peter M, MD  iron polysaccharides (FERREX 150) 150 MG capsule Take 150 mg by mouth 2 (two) times daily.     [provider]  levothyroxine (SYNTHROID, LEVOTHROID) 25 MCG tablet Take 25 mcg by mouth daily before breakfast.    [provider]  metoprolol tartrate (LOPRESSOR) 25 MG tablet TAKE 1 TABLET (25 MG TOTAL) 2 (TWO) TIMES DAILY BY MOUTH 09/30/17   Martinique, Peter M, MD  nitroGLYCERIN (NITROSTAT) 0.4 MG SL tablet Place 1 tablet (0.4 mg total) under the tongue every 5 (five) minutes as needed for chest pain. 08/06/16   Strader, Fransisco Hertz, PA-C  Nutritional Supplements (FEEDING SUPPLEMENT, NEPRO CARB STEADY,) LIQD Take 237 mLs 2 (two) times daily between meals by mouth. 02/03/17   Hongalgi, Lenis Dickinson, MD  pantoprazole (PROTONIX) 40 MG tablet Take 1 tablet (40 mg total) by mouth daily at 6 (six) AM. 03/09/17   Regalado, Cassie Freer, MD    Family History Family History  Problem Relation Age of Onset  . Diabetes Sister     Social History Social History   Tobacco Use  . Smoking status: Never Smoker  . Smokeless tobacco: Never Used  Substance Use Topics  . Alcohol use: No  . Drug use: No     Allergies   Patient has no known allergies.   Review of Systems Review of Systems Ten systems reviewed and are negative for acute change, except as noted in the HPI.    Physical Exam Updated Vital Signs BP 108/75   Pulse 87   Temp  98.1 F (36.7 C) (Oral)   Resp 16   Ht 5\' 3"  (1.6 m)   Wt 59 kg   SpO2 96%   BMI 23.03 kg/m   Physical Exam  Constitutional: She is oriented to person, place, and time. She appears well-developed and well-nourished. No distress.  Nontoxic appearing, pleasant, and in NAD  HENT:  Head: Normocephalic and atraumatic.  Eyes: Conjunctivae and EOM are normal. No scleral icterus.  Neck: Normal range of motion.  Cardiovascular: Normal rate, regular rhythm and intact distal pulses.  Distal radial pulse 2+ in the LUE  Pulmonary/Chest: Effort normal. No respiratory distress.  Respirations even and unlabored  Musculoskeletal: Normal range of motion.  Edema and erythema to the proximal R hand and wrist as well as the distal forearm. Mild warmth to touch without evidence of lymphangitis streaking. Compartments are soft.  Normal ROM of the R hand and wrist without pain with AROM or PROM. No bony deformity or crepitus.  Neurological: She is alert and oriented to person, place, and time. She exhibits normal muscle tone. Coordination normal.  Sensation to light touch intact.  Skin: Skin is warm and dry. She is not diaphoretic. No pallor.  Psychiatric: She has a normal mood and affect. Her behavior is normal.  Nursing note and vitals reviewed.         ED Treatments / Results  Labs (all labs ordered are listed, but only abnormal results are displayed) Labs Reviewed  CBC - Abnormal; Notable for the following components:      Result Value   Hemoglobin 11.9 (*)    Platelets 143 (*)    All other components within normal limits  I-STAT CG4 LACTIC ACID, ED    EKG None  Radiology No results found.  Procedures Procedures (including critical care time)  Medications Ordered in ED Medications  Ampicillin-Sulbactam (UNASYN) 3 g in sodium chloride 0.9 % 100 mL IVPB (3 g Intravenous New Bag/Given 11/23/17 0114)     Initial Impression / Assessment and Plan / ED Course  I have reviewed the  triage vital signs and the nursing notes.  Pertinent labs & imaging results that were available during my care of the patient were reviewed by me and considered in my medical decision making (see chart for details).     84 year old female presents to the emergency department after being bitten by a cat 2 days ago.  She has evidence of cellulitis on exam.  No concern for septic joint as there is no localized joint tenderness and no pain with range of motion.  Patient denies any preceding fevers.  She is afebrile in the emergency department.  No criteria for SIRS or sepsis.  Lactate today is reassuring.  The patient has no leukocytosis.  She was conservatively managed with a dose of IV antibiotics.  Plan to discharge on Augmentin.  The patient has been instructed to closely follow-up with her primary care doctor.  Return precautions discussed and provided.  Patient discharged in stable condition with no unaddressed concerns.   Final Clinical Impressions(s) / ED Diagnoses   Final diagnoses:  Cat bite, initial encounter  Cellulitis of left upper extremity    ED Discharge Orders         Ordered    amoxicillin-clavulanate (AUGMENTIN) 875-125 MG tablet  Every 12 hours     11/23/17 0122           Antonietta Breach, PA-C 30/07/62 2633    Delora Fuel, MD 35/45/62 [phone]

## 2017-11-26 DIAGNOSIS — S61402A Unspecified open wound of left hand, initial encounter: Secondary | ICD-10-CM | POA: Diagnosis not present

## 2017-11-26 DIAGNOSIS — W5501XA Bitten by cat, initial encounter: Secondary | ICD-10-CM | POA: Diagnosis not present

## 2017-11-26 DIAGNOSIS — H6122 Impacted cerumen, left ear: Secondary | ICD-10-CM | POA: Diagnosis not present

## 2017-11-26 DIAGNOSIS — N183 Chronic kidney disease, stage 3 (moderate): Secondary | ICD-10-CM | POA: Diagnosis not present

## 2017-11-26 DIAGNOSIS — I10 Essential (primary) hypertension: Secondary | ICD-10-CM | POA: Diagnosis not present

## 2017-12-24 ENCOUNTER — Other Ambulatory Visit: Payer: Self-pay | Admitting: Dermatology

## 2017-12-24 DIAGNOSIS — C44619 Basal cell carcinoma of skin of left upper limb, including shoulder: Secondary | ICD-10-CM | POA: Diagnosis not present

## 2017-12-25 DIAGNOSIS — E559 Vitamin D deficiency, unspecified: Secondary | ICD-10-CM | POA: Diagnosis not present

## 2017-12-25 DIAGNOSIS — I10 Essential (primary) hypertension: Secondary | ICD-10-CM | POA: Diagnosis not present

## 2017-12-25 DIAGNOSIS — E7849 Other hyperlipidemia: Secondary | ICD-10-CM | POA: Diagnosis not present

## 2017-12-25 DIAGNOSIS — E038 Other specified hypothyroidism: Secondary | ICD-10-CM | POA: Diagnosis not present

## 2018-01-10 DIAGNOSIS — E038 Other specified hypothyroidism: Secondary | ICD-10-CM | POA: Diagnosis not present

## 2018-01-10 DIAGNOSIS — Z0000 Encounter for general adult medical examination without abnormal findings: Secondary | ICD-10-CM | POA: Diagnosis not present

## 2018-01-10 DIAGNOSIS — N183 Chronic kidney disease, stage 3 (moderate): Secondary | ICD-10-CM | POA: Diagnosis not present

## 2018-01-10 DIAGNOSIS — I129 Hypertensive chronic kidney disease with stage 1 through stage 4 chronic kidney disease, or unspecified chronic kidney disease: Secondary | ICD-10-CM | POA: Diagnosis not present

## 2018-01-10 DIAGNOSIS — I10 Essential (primary) hypertension: Secondary | ICD-10-CM | POA: Diagnosis not present

## 2018-01-10 DIAGNOSIS — Z6822 Body mass index (BMI) 22.0-22.9, adult: Secondary | ICD-10-CM | POA: Diagnosis not present

## 2018-01-10 DIAGNOSIS — Z23 Encounter for immunization: Secondary | ICD-10-CM | POA: Diagnosis not present

## 2018-01-10 DIAGNOSIS — C801 Malignant (primary) neoplasm, unspecified: Secondary | ICD-10-CM | POA: Diagnosis not present

## 2018-04-04 NOTE — Progress Notes (Signed)
Cardiology Office Note    Date:  04/07/2018   ID:  Julie Lara, DOB 1921-03-29, MRN [id number]  PCP:  Burnard Bunting, MD  Cardiologist:  Dr. Martinique  Chief Complaint  Patient presents with  . Congestive Heart Failure  . Coronary Artery Disease  . Atrial Fibrillation    History of Present Illness:  Julie Lara is a 85 y.o. female with PMH of CAD (s/p BMS to RCA in 2014), PAF (not on systemic anticoagulation 2/2 GIB), moderate AI, hypothyroidism, and stage III CKD. She was admitted in April 2018 for evaluation chest pain. Heart rate was 100 at that time. Her Lopressor was increased to 25 mg twice a day. Serial troponin was negative. No further ischemic workup was pursued. She was readmitted  in November 2018 with CHF exacerbation. She was diuresed with IV Lasix. Troponin minimally elevated however had a flat trend. BNP was 940. She was in atrial fibrillation with RVR with heart rate of 120. Echocardiogram obtained on 02/02/2017 showed reduction of EF down to 35-40%, diffuse hypokinesis, moderate LVH, dilated ascending aorta at 43 mm, moderate AI, moderate MR, severely dilated left and right atrium, peak PA pressure 33 mmHg. Her discharge weight was up 127 pounds. She was taking 20 mg Lasix every other day due to renal insufficiency.   She was admitted in December 2018 with acute CHF. Diuresed with IV lasix. DC home on 40 mg daily. BNP elevated to 1583. CXR showed pulmonary edema. DC weight 127 lbs. Barium esophagram showed a stricture at the GE junction but she is asymptomatic from this.  On follow up today she is doing well from a cardiac standpoint. She states weight at home has been stable between 127-130 lbs. No increase in dyspnea. No chest pain or palpitations. She was seen in the ED yesterday with swelling of right ankle and inability to bear weight. LE venous doppler negative for DVT. Xray showed no fracture. Creatinine improved. Uric acid level up to 7.8. on a course of steroids  and does note improvement.    Past Medical History:  Diagnosis Date  . Acute blood loss anemia    November 2014  . Aortic insufficiency    mild to moderate  . Arthritis    "right upper arm" (02/01/2017)  . Atrial fibrillation ([place])   . CAD (coronary artery disease)   . Chest pain 04-2010   atypical with negative echocardiogram  . Dilated cardiomyopathy ([place])   . Goiter   . Hypothyroidism   . Idiopathic cardiomyopathy ([place]) 1999   EF 40% by Echo on 10/13/12  . NSTEMI (non-ST elevated myocardial infarction) [facility]) July 2014   95% prox RCA s/p BMS  . PAC (premature atrial contraction)    Chronic    Past Surgical History:  Procedure Laterality Date  . biopsy of sacral lesion  2010  . CARDIAC CATHETERIZATION    . COLONOSCOPY N/A 02/23/2013   Procedure: COLONOSCOPY;  Surgeon: Ladene Artist, MD;  Location: [facility] ENDOSCOPY;  Service: Endoscopy;  Laterality: N/A;  . CORONARY ANGIOPLASTY WITH STENT PLACEMENT  09/2012   BMS-95% prox RCA  . ESOPHAGOGASTRODUODENOSCOPY N/A 02/22/2013   Procedure: ESOPHAGOGASTRODUODENOSCOPY (EGD);  Surgeon: Milus Banister, MD;  Location: [facility];  Service: Endoscopy;  Laterality: N/A;  . LEFT HEART CATHETERIZATION WITH CORONARY ANGIOGRAM N/A 10/14/2012   Procedure: LEFT HEART CATHETERIZATION WITH CORONARY ANGIOGRAM;  Surgeon: Wellington Hampshire, MD;  Location: [facility] CATH LAB;  Service: Cardiovascular;  Laterality: N/A;  . PERCUTANEOUS CORONARY STENT  INTERVENTION (PCI-S) Right 10/14/2012   Procedure: PERCUTANEOUS CORONARY STENT INTERVENTION (PCI-S);  Surgeon: Wellington Hampshire, MD;  Location: [facility] CATH LAB;  Service: Cardiovascular;  Laterality: Right;  . THYROIDECTOMY  2008  . VAGINAL HYSTERECTOMY     Complete    Current Medications: Outpatient Medications Prior to Visit  Medication Sig Dispense Refill  . aspirin EC 81 MG tablet Take 81 mg by mouth daily.    Marland Kitchen atorvastatin (LIPITOR) 40 MG tablet TAKE 1 TABLET DAILY AT 6PM (Patient taking differently: Take  40 mg by mouth daily at 6 PM. ) 90 tablet 3  . Cyanocobalamin (VITAMIN B-12 PO) Take 1 tablet by mouth daily.    . furosemide (LASIX) 40 MG tablet Take 1 tablet (40 mg total) by mouth daily. 90 tablet 3  . iron polysaccharides (FERREX 150) 150 MG capsule Take 150 mg by mouth daily.     Marland Kitchen lactose free nutrition (BOOST) LIQD Take 237 mLs by mouth 3 (three) times daily between meals. Chocolate flavor    . levothyroxine (SYNTHROID, LEVOTHROID) 25 MCG tablet Take 25 mcg by mouth daily before breakfast.    . metoprolol tartrate (LOPRESSOR) 25 MG tablet TAKE 1 TABLET (25 MG TOTAL) 2 (TWO) TIMES DAILY BY MOUTH 180 tablet 1  . nitroGLYCERIN (NITROSTAT) 0.4 MG SL tablet Place 1 tablet (0.4 mg total) under the tongue every 5 (five) minutes as needed for chest pain. 25 tablet 3  . pantoprazole (PROTONIX) 40 MG tablet Take 1 tablet (40 mg total) by mouth daily at 6 (six) AM. 30 tablet 0  . predniSONE (DELTASONE) 10 MG tablet Take 3 tablets (30 mg total) by mouth daily for 5 days. 15 tablet 0  . Nutritional Supplements (FEEDING SUPPLEMENT, NEPRO CARB STEADY,) LIQD Take 237 mLs 2 (two) times daily between meals by mouth.     No facility-administered medications prior to visit.      Allergies:   Patient has no known allergies.   Social History   Socioeconomic History  . Marital status: Widowed    Spouse name: Not on file  . Number of children: Not on file  . Years of education: Not on file  . Highest education level: Not on file  Occupational History  . Occupation: Retired    Fish farm manager: [employer]  . Financial resource strain: Not on file  . Food insecurity:    Worry: Not on file    Inability: Not on file  . Transportation needs:    Medical: Not on file    Non-medical: Not on file  Tobacco Use  . Smoking status: Never Smoker  . Smokeless tobacco: Never Used  Substance and Sexual Activity  . Alcohol use: No  . Drug use: No  . Sexual activity: Not Currently  Lifestyle  .  Physical activity:    Days per week: Not on file    Minutes per session: Not on file  . Stress: Not on file  Relationships  . Social connections:    Talks on phone: Not on file    Gets together: Not on file    Attends religious service: Not on file    Active member of club or organization: Not on file    Attends meetings of clubs or organizations: Not on file    Relationship status: Not on file  Other Topics Concern  . Not on file  Social History Narrative   Pt lives alone, but has family close by.     Family History:  The patient's family history includes Diabetes in her sister.   ROS:   Please see the history of present illness.    ROS All other systems reviewed and are negative.   PHYSICAL EXAM:   VS:  BP 130/82   Pulse 84   Ht 5\' 4"  (1.626 m)   Wt 131 lb 3.2 oz (59.5 kg)   BMI 22.52 kg/m    GENERAL:  Well appearing, elderly WF in NAD HEENT:  PERRL, EOMI, sclera are clear. Oropharynx is clear. NECK:  No jugular venous distention, carotid upstroke brisk and symmetric, no bruits, no thyromegaly or adenopathy LUNGS:  Clear to auscultation bilaterally CHEST:  Unremarkable HEART:  IRRR,  PMI not displaced or sustained,S1 and S2 within normal limits, no S3, no S4: no clicks, no rubs, no murmurs ABD:  Soft, nontender. BS +, no masses or bruits. No hepatomegaly, no splenomegaly EXT:  2 + pulses throughout, no edema, no cyanosis no clubbing SKIN:  Warm and dry.  No rashes NEURO:  Alert and oriented x 3. Cranial nerves II through XII intact. PSYCH:  Cognitively intact   Wt Readings from Last 3 Encounters:  04/07/18 131 lb 3.2 oz (59.5 kg)  11/22/17 130 lb (59 kg)  10/09/17 131 lb 3.2 oz (59.5 kg)      Studies/Labs Reviewed:   EKG:  EKG is ordered today.  Afib with rate 78. Nonspecific ST-T wave abnormality. I have personally reviewed and interpreted this study.   Recent Labs: 06/24/2017: BNP 610.1 04/06/2018: ALT 15; BUN 21; Creatinine, Ser 1.21; Hemoglobin 10.9;  Platelets 136; Potassium 3.7; Sodium 139   Lipid Panel    Component Value Date/Time   CHOL 146 12/15/2012 1552   TRIG 54.0 12/15/2012 1552   HDL 64.10 12/15/2012 1552   CHOLHDL 2 12/15/2012 1552   VLDL 10.8 12/15/2012 1552   LDLCALC 71 12/15/2012 1552   Labs dated 12/19/16: cholesterol 150, triglycerides 77, HDL 64, LDL 71.  Dated 12/25/17: cholesterol 136, triglycerides 67, HDL 55, LDL 68. Hgb 11.4. creatinine 1.4. Other chemistries and TSH normal.   Additional studies/ records that were reviewed today include:   Echo 02/02/2017 LV EF: 35% -   40%  Study Conclusions  - Left ventricle: The cavity size was normal. Wall thickness was   increased in a pattern of moderate LVH. Systolic function was   moderately reduced. The estimated ejection fraction was in the   range of 35% to 40%. Diffuse hypokinesis. The study is not   technically sufficient to allow evaluation of LV diastolic   function. - Aortic valve: Trileaflet; mildly calcified leaflets. There was   moderate regurgitation. - Aorta: Ascending aortic diameter: 43 mm (S). - Ascending aorta: The ascending aorta was mildly dilated. - Mitral valve: Mildly thickened leaflets . There was moderate   regurgitation. - Left atrium: Severely dilated. - Right ventricle: The cavity size was mildly dilated. Systolic   function is mildly reduced. - Right atrium: Severely dilated. - Atrial septum: Bows from left to right, suggesting of high LA >   RA filling pressure. - Tricuspid valve: There was mild regurgitation. - Pulmonary arteries: PA peak pressure: 33 mm Hg (S). - Inferior vena cava: The vessel was dilated. The respirophasic   diameter changes were blunted (< 50%), consistent with elevated   central venous pressure.  Impressions:  - Compared to a prior study in 2014, the LVEF is lower at 35-40%   with diffuse hypokinesis, moderate AI and MR, mild TR, severe  biatrial enlargement. Rhythm appears to be atrial  fibrillation.   ASSESSMENT:    1. Chronic systolic congestive heart failure ([place])   2. CKD (chronic kidney disease), stage III ([place])   3. Moderate aortic regurgitation   4. Coronary artery disease with history of myocardial infarction without history of CABG   5. Persistent atrial fibrillation      PLAN:  In order of problems listed above:  1. CAD: asymptomatic, continue aspirin, Lipitor, and metoprolol.  2. Persistent atrial fibrillation/ permanent: HR is well controlled on Toprol. She is not a candidate for long term anticoagulation due to history of GI bleed.   3. Chronic systolic heart failure: EF 35-40% on recent echocardiogram. On Lasix 40 mg daily. She does have swelling of right leg only. Weight at home stable. I agree that swelling is likely due to gout and is already better on steroids. Will observe. Instructed daughter to increase lasix to bid prn if there is significant weight gain or edema.   4. Moderate aortic regurgitation: Stable, continue observation.  5. Hypothyroidism: On Synthroid, managed by primary care provider  6. CKD stage III stable  7.   Gout on steroids.     Medication Adjustments/Labs and Tests Ordered: Current medicines are reviewed at length with the patient today.  Concerns regarding medicines are outlined above.  Medication changes, Labs and Tests ordered today are listed in the Patient Instructions below. There are no Patient Instructions on file for this visit.   Signed,  [place], MD  04/07/2018 4:47 PM    [place]

## 2018-04-06 ENCOUNTER — Emergency Department (HOSPITAL_COMMUNITY)
Admission: EM | Admit: 2018-04-06 | Discharge: 2018-04-06 | Disposition: A | Discharge status: Home / Self Care | Payer: Medicare Other | Attending: Emergency Medicine | Admitting: Emergency Medicine

## 2018-04-06 ENCOUNTER — Encounter (HOSPITAL_COMMUNITY): Payer: Self-pay | Admitting: Emergency Medicine

## 2018-04-06 ENCOUNTER — Emergency Department (HOSPITAL_COMMUNITY): Payer: Medicare Other

## 2018-04-06 ENCOUNTER — Other Ambulatory Visit: Payer: Self-pay

## 2018-04-06 ENCOUNTER — Emergency Department (HOSPITAL_BASED_OUTPATIENT_CLINIC_OR_DEPARTMENT_OTHER): Payer: Medicare Other

## 2018-04-06 DIAGNOSIS — Z79899 Other long term (current) drug therapy: Secondary | ICD-10-CM | POA: Insufficient documentation

## 2018-04-06 DIAGNOSIS — M109 Gout, unspecified: Secondary | ICD-10-CM

## 2018-04-06 DIAGNOSIS — M79609 Pain in unspecified limb: Secondary | ICD-10-CM

## 2018-04-06 DIAGNOSIS — L538 Other specified erythematous conditions: Secondary | ICD-10-CM

## 2018-04-06 DIAGNOSIS — I2510 Atherosclerotic heart disease of native coronary artery without angina pectoris: Secondary | ICD-10-CM | POA: Diagnosis not present

## 2018-04-06 DIAGNOSIS — E039 Hypothyroidism, unspecified: Secondary | ICD-10-CM | POA: Diagnosis not present

## 2018-04-06 DIAGNOSIS — I130 Hypertensive heart and chronic kidney disease with heart failure and stage 1 through stage 4 chronic kidney disease, or unspecified chronic kidney disease: Secondary | ICD-10-CM | POA: Insufficient documentation

## 2018-04-06 DIAGNOSIS — Z7982 Long term (current) use of aspirin: Secondary | ICD-10-CM | POA: Diagnosis not present

## 2018-04-06 DIAGNOSIS — I5023 Acute on chronic systolic (congestive) heart failure: Secondary | ICD-10-CM | POA: Diagnosis not present

## 2018-04-06 DIAGNOSIS — M7989 Other specified soft tissue disorders: Secondary | ICD-10-CM

## 2018-04-06 DIAGNOSIS — M10071 Idiopathic gout, right ankle and foot: Secondary | ICD-10-CM | POA: Insufficient documentation

## 2018-04-06 DIAGNOSIS — N183 Chronic kidney disease, stage 3 (moderate): Secondary | ICD-10-CM | POA: Diagnosis not present

## 2018-04-06 DIAGNOSIS — M79604 Pain in right leg: Secondary | ICD-10-CM | POA: Diagnosis present

## 2018-04-06 DIAGNOSIS — M79671 Pain in right foot: Secondary | ICD-10-CM | POA: Diagnosis not present

## 2018-04-06 LAB — COMPREHENSIVE METABOLIC PANEL
ALT: 15 U/L (ref 0–44)
ANION GAP: 9 (ref 5–15)
AST: 20 U/L (ref 15–41)
Albumin: 3.7 g/dL (ref 3.5–5.0)
Alkaline Phosphatase: 79 U/L (ref 38–126)
BUN: 21 mg/dL (ref 8–23)
CALCIUM: 9.3 mg/dL (ref 8.9–10.3)
CHLORIDE: 103 mmol/L (ref 98–111)
CO2: 27 mmol/L (ref 22–32)
CREATININE: 1.21 mg/dL — AB (ref 0.44–1.00)
GFR calc Af Amer: 44 mL/min — ABNORMAL LOW (ref >60)
GFR, EST NON AFRICAN AMERICAN: 38 mL/min — AB (ref >60)
Glucose, Bld: 89 mg/dL (ref 70–99)
POTASSIUM: 3.7 mmol/L (ref 3.5–5.1)
Sodium: 139 mmol/L (ref 135–145)
Total Bilirubin: 0.7 mg/dL (ref 0.3–1.2)
Total Protein: 7.3 g/dL (ref 6.5–8.1)

## 2018-04-06 LAB — CBC WITH DIFFERENTIAL/PLATELET
Abs Immature Granulocytes: 0.02 10*3/uL (ref 0.00–0.07)
BASOS ABS: 0 10*3/uL (ref 0.0–0.1)
Basophils Relative: 0 %
EOS ABS: 0.1 10*3/uL (ref 0.0–0.5)
EOS PCT: 1 %
HEMATOCRIT: 34.5 % — AB (ref 36.0–46.0)
HEMOGLOBIN: 10.9 g/dL — AB (ref 12.0–15.0)
Immature Granulocytes: 0 %
LYMPHS ABS: 1.1 10*3/uL (ref 0.7–4.0)
LYMPHS PCT: 16 %
MCH: 29.1 pg (ref 26.0–34.0)
MCHC: 31.6 g/dL (ref 30.0–36.0)
MCV: 92.2 fL (ref 80.0–100.0)
Monocytes Absolute: 0.9 10*3/uL (ref 0.1–1.0)
Monocytes Relative: 14 %
NRBC: 0 % (ref 0.0–0.2)
Neutro Abs: 4.7 10*3/uL (ref 1.7–7.7)
Neutrophils Relative %: 69 %
Platelets: 136 10*3/uL — ABNORMAL LOW (ref 150–400)
RBC: 3.74 MIL/uL — AB (ref 3.87–5.11)
RDW: 14 % (ref 11.5–15.5)
WBC: 6.7 10*3/uL (ref 4.0–10.5)

## 2018-04-06 LAB — URIC ACID: Uric Acid, Serum: 7.8 mg/dL — ABNORMAL HIGH (ref 2.5–7.1)

## 2018-04-06 LAB — D-DIMER, QUANTITATIVE: D-Dimer, Quant: 1.5 ug/mL-FEU — ABNORMAL HIGH (ref 0.00–0.50)

## 2018-04-06 MED ORDER — PREDNISONE 10 MG PO TABS: 30.0000 mg | ORAL_TABLET | Freq: Every day | ORAL | 0 refills | Status: AC

## 2018-04-06 NOTE — ED Provider Notes (Signed)
VASCULAR LAB PRELIMINARY  PRELIMINARY  PRELIMINARY  PRELIMINARY  Right lower extremity venous duplex completed.    Preliminary report:  There is no DVT or SVT noted in the right lower extremity.  Gave report to Dr. Milus Banister, [facility], RVT 04/06/2018, 4:16 PM  Notified that doppler study is negative.  Will dc home with rx for steroids for possible gout flare.   Dorie Rank, MD 04/06/18 1622

## 2018-04-06 NOTE — Progress Notes (Signed)
VASCULAR LAB PRELIMINARY  PRELIMINARY  PRELIMINARY  PRELIMINARY  Right lower extremity venous duplex completed.    Preliminary report:  There is no DVT or SVT noted in the right lower extremity.  Gave report to Dr. Milus Banister, [facility], RVT 04/06/2018, 4:16 PM

## 2018-04-06 NOTE — Discharge Instructions (Signed)
Take the medications as prescribed, return for fever, worsening symptoms, follow up with your doctor next week to be rechecked,

## 2018-04-06 NOTE — ED Triage Notes (Signed)
Rt lower leg swelling and redness since yesterday feels tight,   Hurts to walk on it, no injury

## 2018-04-06 NOTE — ED Provider Notes (Signed)
[facility] EMERGENCY DEPARTMENT Provider Note   CSN: [id number] Arrival date & time: 04/06/18  1102     History   Chief Complaint Chief Complaint  Patient presents with  . Leg Pain    HPI Julie Lara is a 85 y.o. female.  Patient is a pleasant 85 year old female with a history of idiopathic cardiomyopathy, atrial fibrillation, coronary artery disease, aortic insufficiency and NSTEMI who lives at home alone presenting today with ankle and foot redness, soreness and swelling that started yesterday.  She denies any trauma, Bites or scratches, fever, shortness of breath, CP or medication changes.  She states the foot feels tight and was slightly tender to walk on.  She has had no upper leg tenderness.  The history is provided by the patient.  Leg Pain   This is a new problem. The current episode started yesterday. The problem occurs constantly. The problem has been gradually worsening. The pain is present in the right ankle. The quality of the pain is described as aching and constant. The pain is at a severity of 2/10. The pain is moderate. Associated symptoms include limited range of motion and stiffness. The symptoms are aggravated by activity. She has tried nothing for the symptoms. The treatment provided no relief. There has been no history of extremity trauma. hx of     Past Medical History:  Diagnosis Date  . Acute blood loss anemia    November 2014  . Aortic insufficiency    mild to moderate  . Arthritis    "right upper arm" (02/01/2017)  . Atrial fibrillation ([place])   . CAD (coronary artery disease)   . Chest pain 04-2010   atypical with negative echocardiogram  . Dilated cardiomyopathy ([place])   . Goiter   . Hypothyroidism   . Idiopathic cardiomyopathy ([place]) 1999   EF 40% by Echo on 10/13/12  . NSTEMI (non-ST elevated myocardial infarction) [facility]) July 2014   95% prox RCA s/p BMS  . PAC (premature atrial contraction)    Chronic    Patient Active  Problem List   Diagnosis Date Noted  . Essential hypertension 02/11/2017  . Acute on chronic systolic CHF (congestive heart failure) ([place]) 02/01/2017  . HLD (hyperlipidemia) 02/01/2017  . Atrial fibrillation with RVR ([place])   . Chest pain, rule out acute myocardial infarction 07/18/2016  . Acute blood loss anemia 03/12/2013  . CAD (coronary artery disease)   . Benign neoplasm of colon 02/23/2013  . Nonspecific abnormal finding in stool contents 02/23/2013  . Near syncope 02/21/2013  . Anemia 02/21/2013  . Acute on chronic renal failure ([place]) 02/21/2013  . Chronic systolic congestive heart failure ([place]) 02/21/2013  . GI bleed 02/21/2013  . Chronic anticoagulation 02/21/2013  . Coronary artery disease with history of myocardial infarction without history of CABG 02/21/2013  . Resting chest pain 10/27/2012  . Hypothyroidism 10/15/2012  . Atrial fibrillation ([place]) 10/15/2012  . CKD (chronic kidney disease), stage III ([place]) 10/15/2012  . UTI (urinary tract infection) 10/15/2012  . Dilated cardiomyopathy ([place])   . Aortic insufficiency   . PAC (premature atrial contraction)   . Chest pain 04/02/2010    Past Surgical History:  Procedure Laterality Date  . biopsy of sacral lesion  2010  . CARDIAC CATHETERIZATION    . COLONOSCOPY N/A 02/23/2013   Procedure: COLONOSCOPY;  Surgeon: Ladene Artist, MD;  Location: [facility] ENDOSCOPY;  Service: Endoscopy;  Laterality: N/A;  . CORONARY ANGIOPLASTY WITH STENT PLACEMENT  09/2012  BMS-95% prox RCA  . ESOPHAGOGASTRODUODENOSCOPY N/A 02/22/2013   Procedure: ESOPHAGOGASTRODUODENOSCOPY (EGD);  Surgeon: Milus Banister, MD;  Location: [facility];  Service: Endoscopy;  Laterality: N/A;  . LEFT HEART CATHETERIZATION WITH CORONARY ANGIOGRAM N/A 10/14/2012   Procedure: LEFT HEART CATHETERIZATION WITH CORONARY ANGIOGRAM;  Surgeon: Wellington Hampshire, MD;  Location: [facility] CATH LAB;  Service: Cardiovascular;  Laterality: N/A;  . PERCUTANEOUS CORONARY STENT  INTERVENTION (PCI-S) Right 10/14/2012   Procedure: PERCUTANEOUS CORONARY STENT INTERVENTION (PCI-S);  Surgeon: Wellington Hampshire, MD;  Location: [facility] CATH LAB;  Service: Cardiovascular;  Laterality: Right;  . THYROIDECTOMY  2008  . VAGINAL HYSTERECTOMY     Complete     OB History   No obstetric history on file.      Home Medications    Prior to Admission medications   Medication Sig Start Date End Date Taking? Authorizing Provider  aspirin EC 81 MG tablet Take 81 mg by mouth daily.   Yes [provider]  atorvastatin (LIPITOR) 40 MG tablet TAKE 1 TABLET DAILY AT 6PM Patient taking differently: Take 40 mg by mouth daily at 6 PM.  08/12/17  Yes Martinique, Peter M, MD  Cyanocobalamin (VITAMIN B-12 PO) Take 1 tablet by mouth daily.   Yes [provider]  furosemide (LASIX) 40 MG tablet Take 1 tablet (40 mg total) by mouth daily. 08/20/17  Yes [place]ique, Peter M, MD  iron polysaccharides (FERREX 150) 150 MG capsule Take 150 mg by mouth daily.    Yes [provider]  lactose free nutrition (BOOST) LIQD Take 237 mLs by mouth 3 (three) times daily between meals. Chocolate flavor   Yes [provider]  levothyroxine (SYNTHROID, LEVOTHROID) 25 MCG tablet Take 25 mcg by mouth daily before breakfast.   Yes [provider]  metoprolol tartrate (LOPRESSOR) 25 MG tablet TAKE 1 TABLET (25 MG TOTAL) 2 (TWO) TIMES DAILY BY MOUTH 09/30/17  Yes [place]ique, Peter M, MD  pantoprazole (PROTONIX) 40 MG tablet Take 1 tablet (40 mg total) by mouth daily at 6 (six) AM. 03/09/17  Yes Regalado, Belkys A, MD  nitroGLYCERIN (NITROSTAT) 0.4 MG SL tablet Place 1 tablet (0.4 mg total) under the tongue every 5 (five) minutes as needed for chest pain. 08/06/16   Strader, Fransisco Hertz, PA-C  Nutritional Supplements (FEEDING SUPPLEMENT, NEPRO CARB STEADY,) LIQD Take 237 mLs 2 (two) times daily between meals by mouth. Patient not taking: Reported on 04/06/2018 02/03/17   Modena Jansky, MD    Family  History Family History  Problem Relation Age of Onset  . Diabetes Sister     Social History Social History   Tobacco Use  . Smoking status: Never Smoker  . Smokeless tobacco: Never Used  Substance Use Topics  . Alcohol use: No  . Drug use: No     Allergies   Patient has no known allergies.   Review of Systems Review of Systems  Musculoskeletal: Positive for stiffness.  All other systems reviewed and are negative.    Physical Exam Updated Vital Signs BP 96/60 (BP Location: Left Arm)   Pulse 76   Temp 98 F (36.7 C) (Oral)   Resp 18   SpO2 99%   Physical Exam Vitals signs and nursing note reviewed.  Constitutional:      General: She is not in acute distress.    Appearance: She is well-developed.  HENT:     Head: Normocephalic and atraumatic.  Eyes:     Pupils: Pupils are equal, round,  and reactive to light.  Cardiovascular:     Rate and Rhythm: Normal rate and regular rhythm.     Heart sounds: Murmur present. No friction rub.  Pulmonary:     Effort: Pulmonary effort is normal.     Breath sounds: Normal breath sounds. No wheezing or rales.  Abdominal:     General: Bowel sounds are normal. There is no distension.     Palpations: Abdomen is soft.     Tenderness: There is no abdominal tenderness. There is no guarding or rebound.  Musculoskeletal:     Right ankle: She exhibits swelling. She exhibits no deformity. Tenderness. Lateral malleolus tenderness found.       Feet:     Comments: No edema  Skin:    General: Skin is warm and dry.     Capillary Refill: Capillary refill takes 2 to 3 seconds.     Findings: No rash.  Neurological:     Mental Status: She is alert and oriented to person, place, and time.     Cranial Nerves: No cranial nerve deficit.  Psychiatric:        Behavior: Behavior normal.      ED Treatments / Results  Labs (all labs ordered are listed, but only abnormal results are displayed) Labs Reviewed  COMPREHENSIVE METABOLIC PANEL -  Abnormal; Notable for the following components:      Result Value   Creatinine, Ser 1.21 (*)    GFR calc non Af Amer 38 (*)    GFR calc Af Amer 44 (*)    All other components within normal limits  CBC WITH DIFFERENTIAL/PLATELET - Abnormal; Notable for the following components:   RBC 3.74 (*)    Hemoglobin 10.9 (*)    HCT 34.5 (*)    Platelets 136 (*)    All other components within normal limits  D-DIMER, QUANTITATIVE (NOT AT [facility]) - Abnormal; Notable for the following components:   D-Dimer, Quant 1.50 (*)    All other components within normal limits  URIC ACID - Abnormal; Notable for the following components:   Uric Acid, Serum 7.8 (*)    All other components within normal limits    EKG None  Radiology Dg Foot Complete Right  Result Date: 04/06/2018 CLINICAL DATA:  Right foot swelling since yesterday.  No injury. EXAM: RIGHT FOOT COMPLETE - 3+ VIEW COMPARISON:  None. FINDINGS: Deviation of the proximal phalanges medially at the MTP joints 2 through 5 worse at the second MTP joint. No acute fracture or dislocation. Mild degenerate change of the first MTP joint. Small inferior calcaneal spur is present. IMPRESSION: No acute findings. Electronically Signed   By: Marin Olp M.D.   On: 04/06/2018 12:22    Procedures Procedures (including critical care time)  Medications Ordered in ED Medications - No data to display   Initial Impression / Assessment and Plan / ED Course  I have reviewed the triage vital signs and the nursing notes.  Pertinent labs & imaging results that were available during my care of the patient were reviewed by me and considered in my medical decision making (see chart for details).     Elderly female presenting today with right ankle pain and swelling.  Patient denies any infectious symptoms she also denies any shortness of breath or chest pain.  On exam patient's right ankle is erythematous, swollen and warm.  However able to range completely with only  minimal discomfort.  Low suspicion for a septic joint at this time.  Concern for possible gout as patient's uric acid level is elevated she has a history of cardiac disease and is on blood pressure medications which could all precipitate gout.  No prior history of gout.  Lower concern for cellulitis.  White blood cell count within normal limits, hemoglobin unchanged, mild renal insufficiency but unchanged from baseline.  X-ray without acute findings.  Patient did have an elevated d-dimer and ultrasound to rule out clot pending.  4:00 PM U/s pending and pt checked out to dr/ knapp  Final Clinical Impressions(s) / ED Diagnoses   Final diagnoses:  None    ED Discharge Orders    None       Blanchie Dessert, MD 04/06/18 1600

## 2018-04-07 ENCOUNTER — Encounter: Payer: Self-pay | Admitting: Cardiology

## 2018-04-07 ENCOUNTER — Ambulatory Visit (INDEPENDENT_AMBULATORY_CARE_PROVIDER_SITE_OTHER): Payer: Medicare Other | Admitting: Cardiology

## 2018-04-07 VITALS — BP 130/82 | HR 84 | Ht 64.0 in | Wt 131.2 lb

## 2018-04-07 DIAGNOSIS — I2510 Atherosclerotic heart disease of native coronary artery without angina pectoris: Secondary | ICD-10-CM | POA: Diagnosis not present

## 2018-04-07 DIAGNOSIS — N183 Chronic kidney disease, stage 3 unspecified: Secondary | ICD-10-CM

## 2018-04-07 DIAGNOSIS — I5022 Chronic systolic (congestive) heart failure: Secondary | ICD-10-CM | POA: Diagnosis not present

## 2018-04-07 DIAGNOSIS — I252 Old myocardial infarction: Secondary | ICD-10-CM

## 2018-04-07 DIAGNOSIS — I351 Nonrheumatic aortic (valve) insufficiency: Secondary | ICD-10-CM

## 2018-04-07 DIAGNOSIS — I4819 Other persistent atrial fibrillation: Secondary | ICD-10-CM | POA: Diagnosis not present

## 2018-04-07 MED ORDER — NITROGLYCERIN 0.4 MG SL SUBL: 0.4000 mg | SUBLINGUAL_TABLET | SUBLINGUAL | 3 refills | Status: DC | PRN

## 2018-04-15 ENCOUNTER — Other Ambulatory Visit: Payer: Self-pay | Admitting: Dermatology

## 2018-04-15 DIAGNOSIS — L570 Actinic keratosis: Secondary | ICD-10-CM | POA: Diagnosis not present

## 2018-04-15 DIAGNOSIS — D485 Neoplasm of uncertain behavior of skin: Secondary | ICD-10-CM | POA: Diagnosis not present

## 2018-04-15 DIAGNOSIS — L308 Other specified dermatitis: Secondary | ICD-10-CM | POA: Diagnosis not present

## 2018-05-15 DIAGNOSIS — L0889 Other specified local infections of the skin and subcutaneous tissue: Secondary | ICD-10-CM | POA: Diagnosis not present

## 2018-05-15 DIAGNOSIS — W5501XA Bitten by cat, initial encounter: Secondary | ICD-10-CM | POA: Diagnosis not present

## 2018-05-15 DIAGNOSIS — Z6823 Body mass index (BMI) 23.0-23.9, adult: Secondary | ICD-10-CM | POA: Diagnosis not present

## 2018-06-23 ENCOUNTER — Other Ambulatory Visit: Payer: Self-pay

## 2018-06-23 MED ORDER — METOPROLOL TARTRATE 25 MG PO TABS: 25.0000 mg | ORAL_TABLET | Freq: Two times a day (BID) | ORAL | 1 refills | Status: DC

## 2018-07-16 DIAGNOSIS — E785 Hyperlipidemia, unspecified: Secondary | ICD-10-CM | POA: Diagnosis not present

## 2018-07-16 DIAGNOSIS — I130 Hypertensive heart and chronic kidney disease with heart failure and stage 1 through stage 4 chronic kidney disease, or unspecified chronic kidney disease: Secondary | ICD-10-CM | POA: Diagnosis not present

## 2018-07-16 DIAGNOSIS — E038 Other specified hypothyroidism: Secondary | ICD-10-CM | POA: Diagnosis not present

## 2018-07-16 DIAGNOSIS — E559 Vitamin D deficiency, unspecified: Secondary | ICD-10-CM | POA: Diagnosis not present

## 2018-07-16 DIAGNOSIS — K623 Rectal prolapse: Secondary | ICD-10-CM | POA: Diagnosis not present

## 2018-07-16 DIAGNOSIS — M5136 Other intervertebral disc degeneration, lumbar region: Secondary | ICD-10-CM | POA: Diagnosis not present

## 2018-07-16 DIAGNOSIS — D649 Anemia, unspecified: Secondary | ICD-10-CM | POA: Diagnosis not present

## 2018-07-16 DIAGNOSIS — N183 Chronic kidney disease, stage 3 (moderate): Secondary | ICD-10-CM | POA: Diagnosis not present

## 2018-07-16 DIAGNOSIS — C801 Malignant (primary) neoplasm, unspecified: Secondary | ICD-10-CM | POA: Diagnosis not present

## 2018-07-16 DIAGNOSIS — I420 Dilated cardiomyopathy: Secondary | ICD-10-CM | POA: Diagnosis not present

## 2018-07-16 DIAGNOSIS — K649 Unspecified hemorrhoids: Secondary | ICD-10-CM | POA: Diagnosis not present

## 2018-07-16 DIAGNOSIS — I5022 Chronic systolic (congestive) heart failure: Secondary | ICD-10-CM | POA: Diagnosis not present

## 2018-08-19 ENCOUNTER — Other Ambulatory Visit: Payer: Self-pay

## 2018-08-19 MED ORDER — FUROSEMIDE 40 MG PO TABS: 40.0000 mg | ORAL_TABLET | Freq: Every day | ORAL | 3 refills | Status: DC

## 2018-10-14 ENCOUNTER — Telehealth: Payer: Self-pay | Admitting: *Deleted

## 2018-10-14 MED ORDER — ATORVASTATIN CALCIUM 40 MG PO TABS: 40.0000 mg | ORAL_TABLET | Freq: Every day | ORAL | 0 refills | Status: DC

## 2018-10-14 NOTE — Telephone Encounter (Signed)
PATIENT'S DAUGHTER WALKED INTO THE OFFICE - REQUESTING  MEDICATION TO BE REFILLED FOR [facility]   PRESCRIPTION WAS E-SENT BY RN  TO [facility] 90 DAY  NO REFILL  DAUGHTER  INFORMED( FRONT DESK)  PATIENT NEEDS AN APPOINTMENT- NEXT AVAILABLE - WHICH IS ? OCT 2020.

## 2018-10-16 ENCOUNTER — Other Ambulatory Visit: Payer: Self-pay | Admitting: Cardiology

## 2018-10-16 MED ORDER — ATORVASTATIN CALCIUM 40 MG PO TABS: 40.0000 mg | ORAL_TABLET | Freq: Every day | ORAL | 0 refills | Status: DC

## 2018-10-16 NOTE — Telephone Encounter (Signed)
Lipitor 40 mg daily refilled.

## 2018-10-16 NOTE — Telephone Encounter (Signed)
 *  STAT* If patient is at the pharmacy, call can be transferred to refill team.   1. Which medications need to be refilled? (please list name of each medication and dose if known)   atorvastatin (LIPITOR) 40 MG tablet  2. Which pharmacy/location (including street and city if local pharmacy) is medication to be sent to? [facility]  3. Do they need a 30 day or 90 day supply? 90  Patient is completely out. Refill was sent to wrong pharmacy. She no longer uses the mail order pharmacy

## 2018-12-12 ENCOUNTER — Other Ambulatory Visit: Payer: Self-pay | Admitting: Cardiology

## 2018-12-23 ENCOUNTER — Other Ambulatory Visit: Payer: Self-pay | Admitting: Cardiology

## 2019-01-01 NOTE — Progress Notes (Signed)
Virtual Visit via Telephone Note   This visit type was conducted due to national recommendations for restrictions regarding the COVID-19 Pandemic (e.g. social distancing) in an effort to limit this patient's exposure and mitigate transmission in our community.  Due to her co-morbid illnesses, this patient is at least at moderate risk for complications without adequate follow up.  This format is felt to be most appropriate for this patient at this time.  The patient did not have access to video technology/had technical difficulties with video requiring transitioning to audio format only (telephone).  All issues noted in this document were discussed and addressed.  No physical exam could be performed with this format.  Please refer to the patient's chart for her  consent to telehealth for [facility]   Date:  01/02/2019   ID:  Julie Lara, DOB 10-15-1920, MRN [id number]  Patient Location: Home with daughter Provider Location: Home  PCP:  Burnard Bunting, MD  Cardiologist:  Tyrome Donatelli [place] MD Electrophysiologist:  None   Evaluation Performed:  Follow-Up Visit  Chief Complaint:  CHF/AFib/CAD  History of Present Illness:    Julie Lara is a 85 y.o. female with with PMH of CAD (s/p BMS to RCA in 2014), PAF (not on systemic anticoagulation 2/2 GIB), moderate AI, hypothyroidism, and stage III CKD. She was admitted in April 2018 for evaluation chest pain. Heart rate was 100 at that time. Her Lopressor was increased to 25 mg twice a day. Serial troponin was negative. No further ischemic workup was pursued. She was readmitted  in November 2018 with CHF exacerbation. She was diuresed with IV Lasix. Troponin minimally elevated however had a flat trend. BNP was 940. She was in atrial fibrillation with RVR with heart rate of 120. Echocardiogram obtained on 02/02/2017 showed reduction of EF down to 35-40%, diffuse hypokinesis, moderate LVH, dilated ascending aorta at 43 mm, moderate AI, moderate MR,  severely dilated left and right atrium, peak PA pressure 33 mmHg. Her discharge weight was up 127 pounds. She was taking 20 mg Lasix every other day due to renal insufficiency.   She was admitted in December 2018 with acute CHF. Diuresed with IV lasix. DC home on 40 mg daily. BNP elevated to 1583. CXR showed pulmonary edema. DC weight 127 lbs. Barium esophagram showed a stricture at the GE junction but she is asymptomatic from this.  She is doing well currently. Notes some mild swelling in her right ankle. Only gets SOB when she moves too fast and she doesn't do this often. No chest pain. Weight is stable. No palpitations.   The patient does not have symptoms concerning for COVID-19 infection (fever, chills, cough, or new shortness of breath).    Past Medical History:  Diagnosis Date   Acute blood loss anemia    November 2014   Aortic insufficiency    mild to moderate   Arthritis    "right upper arm" (02/01/2017)   Atrial fibrillation ([facility])    CAD (coronary artery disease)    Chest pain 04-2010   atypical with negative echocardiogram   Dilated cardiomyopathy ([place])    Goiter    Hypothyroidism    Idiopathic cardiomyopathy ([place]) 1999   EF 40% by Echo on 10/13/12   NSTEMI (non-ST elevated myocardial infarction) [facility]) July 2014   95% prox RCA s/p BMS   PAC (premature atrial contraction)    Chronic   Past Surgical History:  Procedure Laterality Date   biopsy of sacral lesion  2010  CARDIAC CATHETERIZATION     COLONOSCOPY N/A 02/23/2013   Procedure: COLONOSCOPY;  Surgeon: Ladene Artist, MD;  Location: [facility] ENDOSCOPY;  Service: Endoscopy;  Laterality: N/A;   CORONARY ANGIOPLASTY WITH STENT PLACEMENT  09/2012   BMS-95% prox RCA   ESOPHAGOGASTRODUODENOSCOPY N/A 02/22/2013   Procedure: ESOPHAGOGASTRODUODENOSCOPY (EGD);  Surgeon: Milus Banister, MD;  Location: [facility];  Service: Endoscopy;  Laterality: N/A;   LEFT HEART CATHETERIZATION WITH CORONARY ANGIOGRAM N/A  10/14/2012   Procedure: LEFT HEART CATHETERIZATION WITH CORONARY ANGIOGRAM;  Surgeon: Wellington Hampshire, MD;  [facility] CATH LAB;  Service: Cardiovascular;  Laterality: N/A;   PERCUTANEOUS CORONARY STENT INTERVENTION (PCI-S) Right 10/14/2012   Procedure: PERCUTANEOUS CORONARY STENT INTERVENTION (PCI-S);  Surgeon: Wellington Hampshire, MD;  Location: [facility] CATH LAB;  Service: Cardiovascular;  Laterality: Right;   THYROIDECTOMY  2008   VAGINAL HYSTERECTOMY     Complete     Current Meds  Medication Sig   aspirin EC 81 MG tablet Take 81 mg by mouth daily.   atorvastatin (LIPITOR) 40 MG tablet Take 1 tablet (40 mg total) by mouth daily at 6 PM.   Cyanocobalamin (VITAMIN B-12 PO) Take 1 tablet by mouth daily.   furosemide (LASIX) 40 MG tablet Take 1 tablet (40 mg total) by mouth daily.   iron polysaccharides (FERREX 150) 150 MG capsule Take 150 mg by mouth daily.    lactose free nutrition (BOOST) LIQD Take 237 mLs by mouth 3 (three) times daily between meals. Chocolate flavor   levothyroxine (SYNTHROID, LEVOTHROID) 25 MCG tablet Take 25 mcg by mouth daily before breakfast.   metoprolol tartrate (LOPRESSOR) 25 MG tablet TAKE 1 TABLET BY MOUTH TWICE A DAY   nitroGLYCERIN (NITROSTAT) 0.4 MG SL tablet Place 1 tablet (0.4 mg total) under the tongue every 5 (five) minutes as needed for chest pain.   pantoprazole (PROTONIX) 40 MG tablet Take 1 tablet (40 mg total) by mouth daily at 6 (six) AM.   [DISCONTINUED] atorvastatin (LIPITOR) 40 MG tablet Take 1 tablet (40 mg total) by mouth daily at 6 PM.     Allergies:   Patient has no known allergies.   Social History   Tobacco Use   Smoking status: Never Smoker   Smokeless tobacco: Never Used  Substance Use Topics   Alcohol use: No   Drug use: No     Family Hx: The patient's family history includes Diabetes in her sister.  ROS:   Please see the history of present illness.     All other systems reviewed and are negative.   Prior  CV studies:   The following studies were reviewed today:  Echo 02/02/2017 LV EF: 35% - 40%  Study Conclusions  - Left ventricle: The cavity size was normal. Wall thickness was increased in a pattern of moderate LVH. Systolic function was moderately reduced. The estimated ejection fraction was in the range of 35% to 40%. Diffuse hypokinesis. The study is not technically sufficient to allow evaluation of LV diastolic function. - Aortic valve: Trileaflet; mildly calcified leaflets. There was moderate regurgitation. - Aorta: Ascending aortic diameter: 43 mm (S). - Ascending aorta: The ascending aorta was mildly dilated. - Mitral valve: Mildly thickened leaflets . There was moderate regurgitation. - Left atrium: Severely dilated. - Right ventricle: The cavity size was mildly dilated. Systolic function is mildly reduced. - Right atrium: Severely dilated. - Atrial septum: Bows from left to right, suggesting of high LA > RA filling pressure. - Tricuspid valve: There was  mild regurgitation. - Pulmonary arteries: PA peak pressure: 33 mm Hg (S). - Inferior vena cava: The vessel was dilated. The respirophasic diameter changes were blunted (<50%), consistent with elevated central venous pressure.  Impressions:  - Compared to a prior study in 2014, the LVEF is lower at 35-40% with diffuse hypokinesis, moderate AI and MR, mild TR, severe biatrial enlargement. Rhythm appears to be atrial fibrillation.  Labs/Other Tests and Data Reviewed:    EKG:  No ECG reviewed.  Recent Labs: 04/06/2018: ALT 15; BUN 21; Creatinine, Ser 1.21; Hemoglobin 10.9; Platelets 136; Potassium 3.7; Sodium 139   Recent Lipid Panel Lab Results  Component Value Date/Time   CHOL 146 12/15/2012 03:52 PM   TRIG 54.0 12/15/2012 03:52 PM   HDL 64.10 12/15/2012 03:52 PM   CHOLHDL 2 12/15/2012 03:52 PM   LDLCALC 71 12/15/2012 03:52 PM   Dated 12/25/17: cholesterol 136, triglycerides 67,  HDL 55, LDL 68. TSH normal  Wt Readings from Last 3 Encounters:  01/02/19 125 lb (56.7 kg)  04/07/18 131 lb 3.2 oz (59.5 kg)  11/22/17 130 lb (59 kg)     Objective:    Vital Signs:  BP 119/76    Pulse 68    Ht 5\' 2"  (1.575 m)    Wt 125 lb (56.7 kg)    SpO2 100%    BMI 22.86 kg/m    VITAL SIGNS:  reviewed  ASSESSMENT & PLAN:    1. CAD: asymptomatic, continue aspirin, Lipitor, and metoprolol.  2. Persistent atrial fibrillation/ permanent: HR is well controlled on Toprol. She is not a candidate for long term anticoagulation due to history of GI bleed.   3. Chronic systolic heart failure: EF 35-40% on last echocardiogram. On Lasix 40 mg daily. weight is stable. No increase in dyspnea. Sodium restriction. Follow up in 6 months.   4. Moderate aortic regurgitation: Stable, continue observation.  5. Hypothyroidism: On Synthroid, managed by primary care provider  6. CKD stage III stable  COVID-19 Education: The signs and symptoms of COVID-19 were discussed with the patient and how to seek care for testing (follow up with PCP or arrange E-visit).  The importance of social distancing was discussed today.  Time:   Today, I have spent 15 minutes with the patient with telehealth technology discussing the above problems.     Medication Adjustments/Labs and Tests Ordered: Current medicines are reviewed at length with the patient today.  Concerns regarding medicines are outlined above.   Tests Ordered: No orders of the defined types were placed in this encounter.   Medication Changes: Meds ordered this encounter  Medications   atorvastatin (LIPITOR) 40 MG tablet    Sig: Take 1 tablet (40 mg total) by mouth daily at 6 PM.    Dispense:  90 tablet    Refill:  3    Follow Up:  In Person in 6 month(s)  Signed, Linah Klapper [place], MD  01/02/2019 2:31 PM    [place]

## 2019-01-02 ENCOUNTER — Telehealth (INDEPENDENT_AMBULATORY_CARE_PROVIDER_SITE_OTHER): Payer: Medicare Other | Admitting: Cardiology

## 2019-01-02 ENCOUNTER — Encounter: Payer: Self-pay | Admitting: Cardiology

## 2019-01-02 VITALS — BP 119/76 | HR 68 | Ht 62.0 in | Wt 125.0 lb

## 2019-01-02 DIAGNOSIS — I351 Nonrheumatic aortic (valve) insufficiency: Secondary | ICD-10-CM

## 2019-01-02 DIAGNOSIS — I5022 Chronic systolic (congestive) heart failure: Secondary | ICD-10-CM

## 2019-01-02 DIAGNOSIS — I4819 Other persistent atrial fibrillation: Secondary | ICD-10-CM

## 2019-01-02 DIAGNOSIS — I2510 Atherosclerotic heart disease of native coronary artery without angina pectoris: Secondary | ICD-10-CM

## 2019-01-02 MED ORDER — ATORVASTATIN CALCIUM 40 MG PO TABS: 40.0000 mg | ORAL_TABLET | Freq: Every day | ORAL | 3 refills | Status: DC

## 2019-01-02 NOTE — Patient Instructions (Signed)
Medication Instructions:  Continue same medications If you need a refill on your cardiac medications before your next appointment, please call your pharmacy.   Lab work: None ordered   Testing/Procedures: None ordered  Follow-Up: At [facility], you and your health needs are our priority.  As part of our continuing mission to provide you with exceptional heart care, we have created designated Provider Care Teams.  These Care Teams include your primary Cardiologist (physician) and Advanced Practice Providers (APPs -  Physician Assistants and Nurse Practitioners) who all work together to provide you with the care you need, when you need it. . Schedule follow up appointment in 6 months  Call in Jan to schedule April appointment .

## 2019-01-06 DIAGNOSIS — E7849 Other hyperlipidemia: Secondary | ICD-10-CM | POA: Diagnosis not present

## 2019-01-06 DIAGNOSIS — E559 Vitamin D deficiency, unspecified: Secondary | ICD-10-CM | POA: Diagnosis not present

## 2019-01-06 DIAGNOSIS — Z23 Encounter for immunization: Secondary | ICD-10-CM | POA: Diagnosis not present

## 2019-01-06 DIAGNOSIS — R82998 Other abnormal findings in urine: Secondary | ICD-10-CM | POA: Diagnosis not present

## 2019-01-06 DIAGNOSIS — I10 Essential (primary) hypertension: Secondary | ICD-10-CM | POA: Diagnosis not present

## 2019-01-06 DIAGNOSIS — E038 Other specified hypothyroidism: Secondary | ICD-10-CM | POA: Diagnosis not present

## 2019-01-12 DIAGNOSIS — I5022 Chronic systolic (congestive) heart failure: Secondary | ICD-10-CM | POA: Diagnosis not present

## 2019-01-12 DIAGNOSIS — I420 Dilated cardiomyopathy: Secondary | ICD-10-CM | POA: Diagnosis not present

## 2019-01-12 DIAGNOSIS — K623 Rectal prolapse: Secondary | ICD-10-CM | POA: Diagnosis not present

## 2019-01-12 DIAGNOSIS — E785 Hyperlipidemia, unspecified: Secondary | ICD-10-CM | POA: Diagnosis not present

## 2019-01-12 DIAGNOSIS — E038 Other specified hypothyroidism: Secondary | ICD-10-CM | POA: Diagnosis not present

## 2019-01-12 DIAGNOSIS — I129 Hypertensive chronic kidney disease with stage 1 through stage 4 chronic kidney disease, or unspecified chronic kidney disease: Secondary | ICD-10-CM | POA: Diagnosis not present

## 2019-01-12 DIAGNOSIS — I10 Essential (primary) hypertension: Secondary | ICD-10-CM | POA: Diagnosis not present

## 2019-01-12 DIAGNOSIS — C801 Malignant (primary) neoplasm, unspecified: Secondary | ICD-10-CM | POA: Diagnosis not present

## 2019-01-12 DIAGNOSIS — K649 Unspecified hemorrhoids: Secondary | ICD-10-CM | POA: Diagnosis not present

## 2019-01-12 DIAGNOSIS — N183 Chronic kidney disease, stage 3 unspecified: Secondary | ICD-10-CM | POA: Diagnosis not present

## 2019-01-12 DIAGNOSIS — M5136 Other intervertebral disc degeneration, lumbar region: Secondary | ICD-10-CM | POA: Diagnosis not present

## 2019-01-12 DIAGNOSIS — Z0000 Encounter for general adult medical examination without abnormal findings: Secondary | ICD-10-CM | POA: Diagnosis not present

## 2019-01-12 IMAGING — DX DG CHEST 2V
2 series · 2 of 2 positions shown · non-contrast
Comparison: Prior radiograph from 07/17/2016.

CLINICAL DATA: Initial evaluation for acute shortness of breath.

EXAM:
CHEST  2 VIEW

[chest lat]
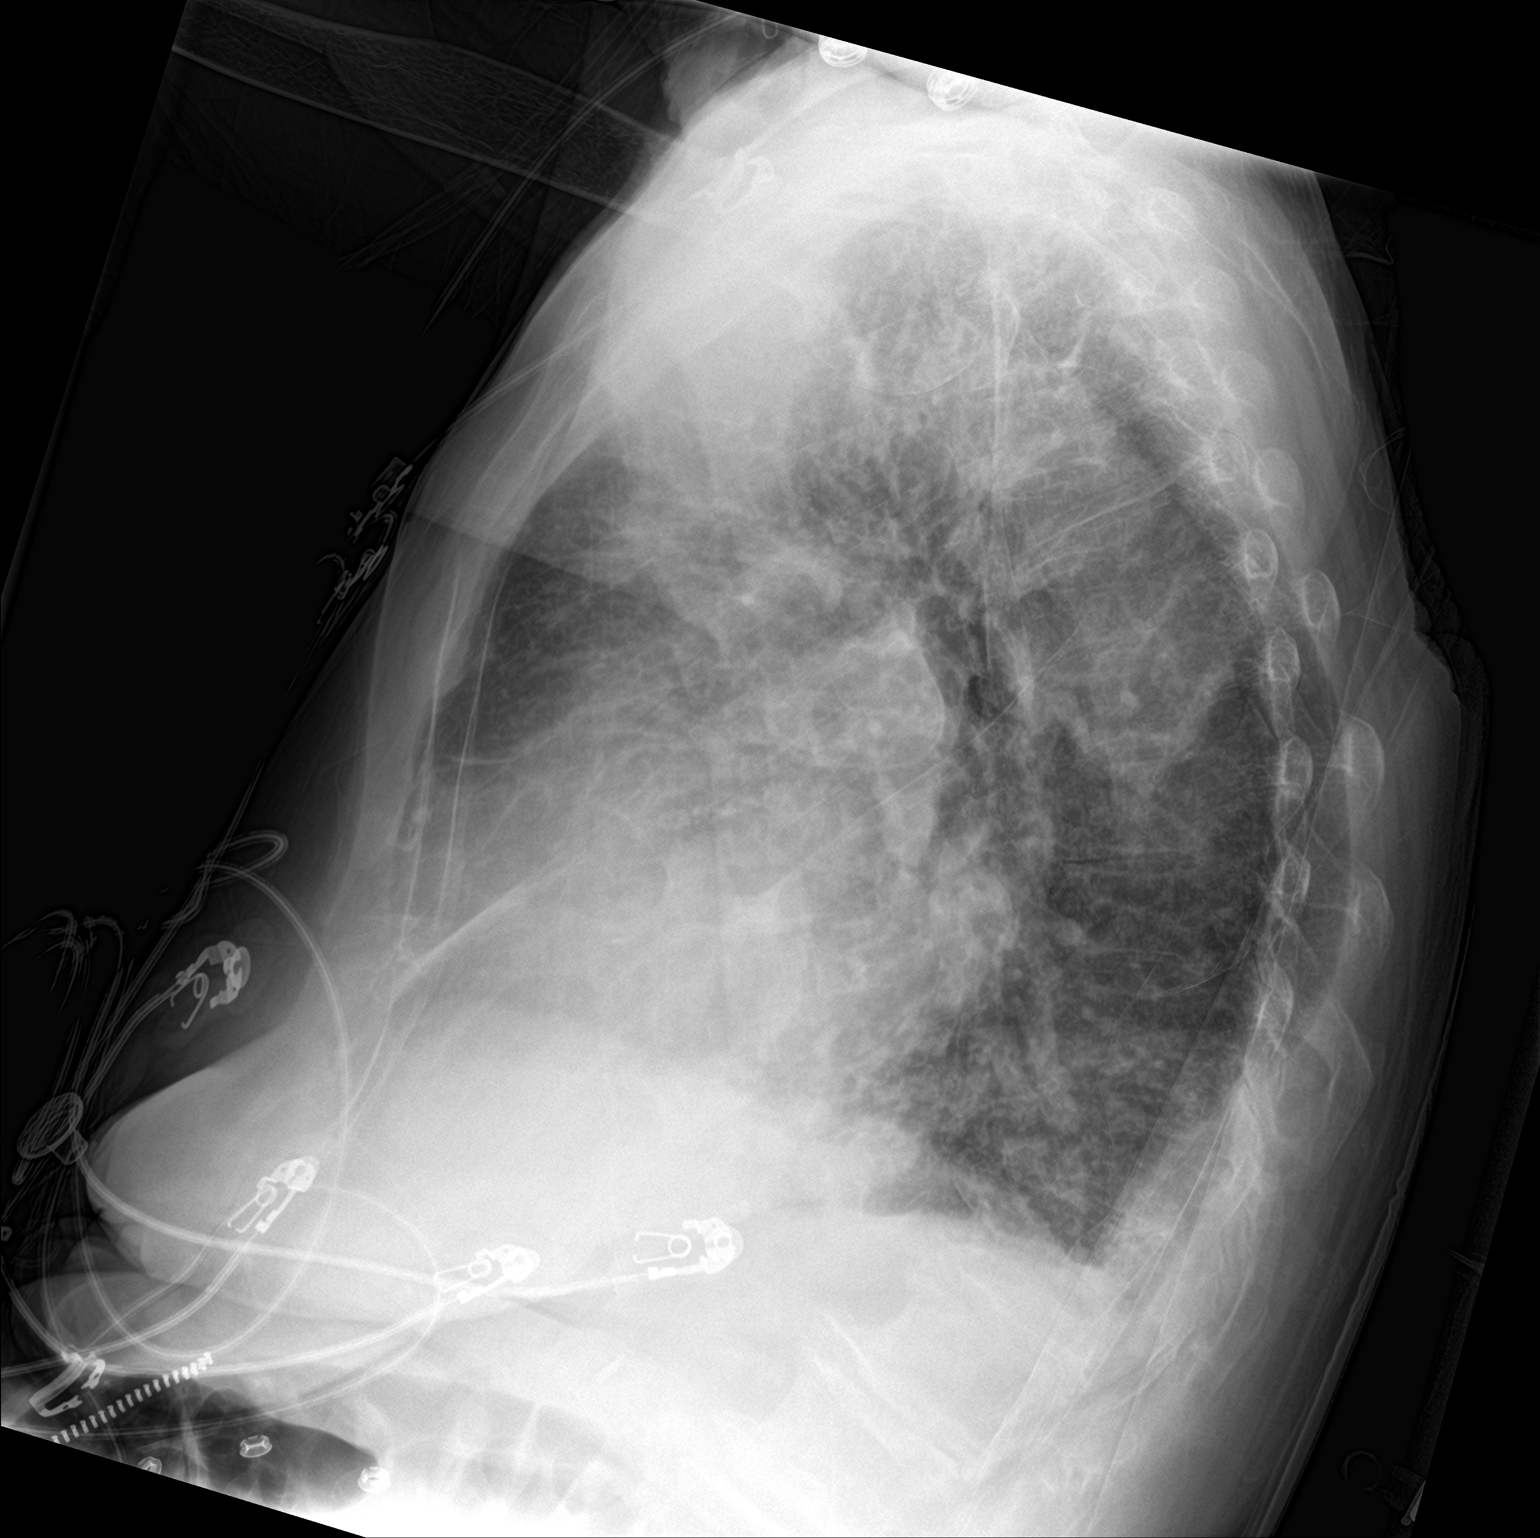

[chest ap]
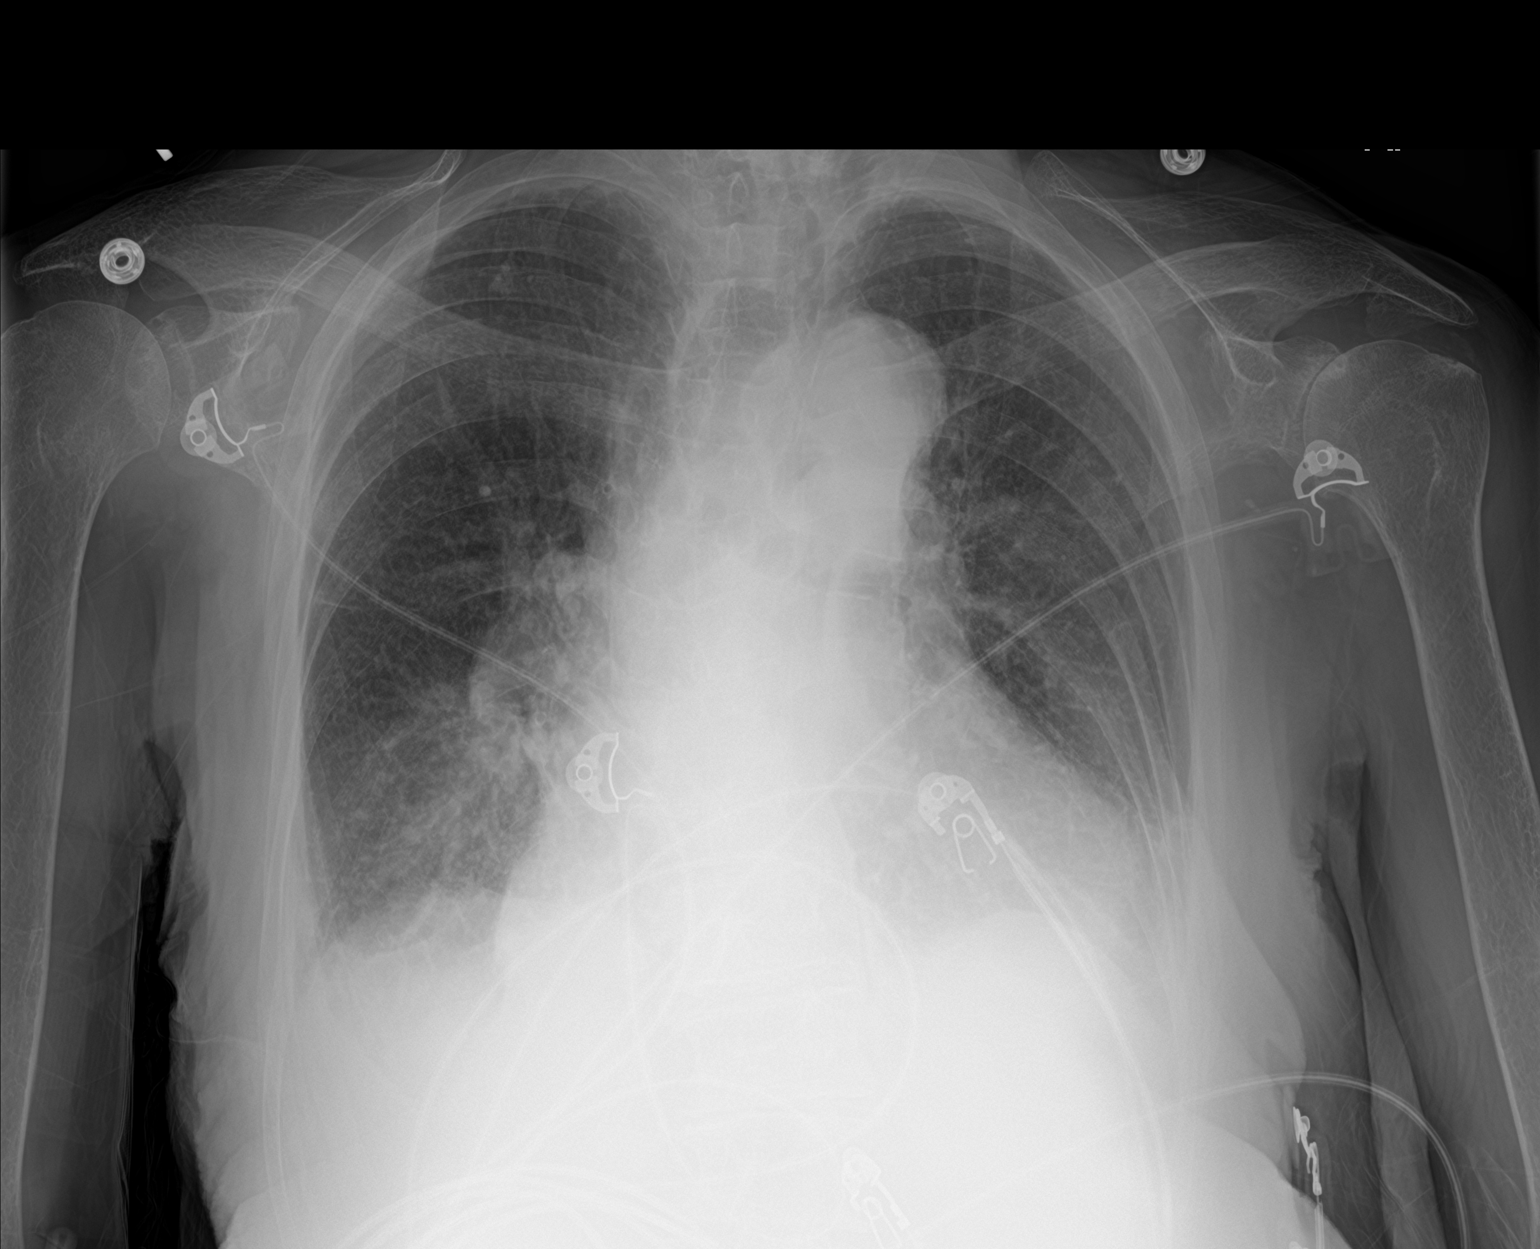

[2 of 2 positions shown; findings below may reference images not displayed]

FINDINGS: Moderate cardiomegaly, stable. Mediastinal silhouette normal. Aortic
atherosclerosis.

Lungs hypoinflated. Pulmonary vascular congestion with diffuse
interstitial prominence, compatible pulmonary interstitial edema.
Small bilateral pleural effusions. Associated bibasilar atelectasis.
No other focal infiltrates. No pneumothorax.

No acute osseus abnormality.  Diffuse osteopenia noted.
IMPRESSION: 1. Cardiomegaly with mild diffuse pulmonary interstitial edema and
small bilateral pleural effusions, suggesting CHF.
2. Shallow lung inflation with superimposed mild bibasilar
atelectasis.

## 2019-01-21 DIAGNOSIS — M10372 Gout due to renal impairment, left ankle and foot: Secondary | ICD-10-CM | POA: Diagnosis not present

## 2019-01-21 DIAGNOSIS — I130 Hypertensive heart and chronic kidney disease with heart failure and stage 1 through stage 4 chronic kidney disease, or unspecified chronic kidney disease: Secondary | ICD-10-CM | POA: Diagnosis not present

## 2019-01-21 DIAGNOSIS — R2242 Localized swelling, mass and lump, left lower limb: Secondary | ICD-10-CM | POA: Diagnosis not present

## 2019-01-21 DIAGNOSIS — N184 Chronic kidney disease, stage 4 (severe): Secondary | ICD-10-CM | POA: Diagnosis not present

## 2019-01-21 DIAGNOSIS — I5022 Chronic systolic (congestive) heart failure: Secondary | ICD-10-CM | POA: Diagnosis not present

## 2019-05-04 ENCOUNTER — Other Ambulatory Visit (HOSPITAL_COMMUNITY): Payer: Self-pay | Admitting: Internal Medicine

## 2019-05-04 ENCOUNTER — Ambulatory Visit (HOSPITAL_COMMUNITY)
Admission: RE | Admit: 2019-05-04 | Discharge: 2019-05-04 | Disposition: A | Discharge status: Home / Self Care | Payer: Medicare Other | Source: Ambulatory Visit | Attending: Surgery | Admitting: Surgery

## 2019-05-04 ENCOUNTER — Other Ambulatory Visit: Payer: Self-pay

## 2019-05-04 DIAGNOSIS — R600 Localized edema: Secondary | ICD-10-CM | POA: Diagnosis not present

## 2019-05-04 DIAGNOSIS — M7989 Other specified soft tissue disorders: Secondary | ICD-10-CM | POA: Diagnosis not present

## 2019-05-04 DIAGNOSIS — W010XXA Fall on same level from slipping, tripping and stumbling without subsequent striking against object, initial encounter: Secondary | ICD-10-CM | POA: Diagnosis not present

## 2019-05-04 DIAGNOSIS — M79604 Pain in right leg: Secondary | ICD-10-CM | POA: Insufficient documentation

## 2019-05-04 DIAGNOSIS — L03115 Cellulitis of right lower limb: Secondary | ICD-10-CM | POA: Diagnosis not present

## 2019-05-04 DIAGNOSIS — N184 Chronic kidney disease, stage 4 (severe): Secondary | ICD-10-CM | POA: Diagnosis not present

## 2019-05-04 DIAGNOSIS — I130 Hypertensive heart and chronic kidney disease with heart failure and stage 1 through stage 4 chronic kidney disease, or unspecified chronic kidney disease: Secondary | ICD-10-CM | POA: Diagnosis not present

## 2019-05-04 DIAGNOSIS — I5022 Chronic systolic (congestive) heart failure: Secondary | ICD-10-CM | POA: Diagnosis not present

## 2019-05-27 ENCOUNTER — Other Ambulatory Visit: Payer: Self-pay

## 2019-05-27 ENCOUNTER — Ambulatory Visit
Admission: RE | Admit: 2019-05-27 | Discharge: 2019-05-27 | Disposition: A | Discharge status: Home / Self Care | Payer: Medicare Other | Source: Ambulatory Visit | Attending: Internal Medicine | Admitting: Internal Medicine

## 2019-05-27 ENCOUNTER — Other Ambulatory Visit: Payer: Self-pay | Admitting: Internal Medicine

## 2019-05-27 DIAGNOSIS — S8991XA Unspecified injury of right lower leg, initial encounter: Secondary | ICD-10-CM | POA: Diagnosis not present

## 2019-05-27 DIAGNOSIS — M79604 Pain in right leg: Secondary | ICD-10-CM | POA: Diagnosis not present

## 2019-05-27 DIAGNOSIS — M25561 Pain in right knee: Secondary | ICD-10-CM | POA: Diagnosis not present

## 2019-05-27 DIAGNOSIS — M25551 Pain in right hip: Secondary | ICD-10-CM | POA: Diagnosis not present

## 2019-05-27 DIAGNOSIS — R600 Localized edema: Secondary | ICD-10-CM | POA: Diagnosis not present

## 2019-05-27 DIAGNOSIS — S79911A Unspecified injury of right hip, initial encounter: Secondary | ICD-10-CM | POA: Diagnosis not present

## 2019-05-27 DIAGNOSIS — W19XXXA Unspecified fall, initial encounter: Secondary | ICD-10-CM

## 2019-05-27 DIAGNOSIS — I130 Hypertensive heart and chronic kidney disease with heart failure and stage 1 through stage 4 chronic kidney disease, or unspecified chronic kidney disease: Secondary | ICD-10-CM | POA: Diagnosis not present

## 2019-05-27 DIAGNOSIS — N184 Chronic kidney disease, stage 4 (severe): Secondary | ICD-10-CM | POA: Diagnosis not present

## 2019-05-28 ENCOUNTER — Ambulatory Visit
Admission: RE | Admit: 2019-05-28 | Discharge: 2019-05-28 | Disposition: A | Discharge status: Home / Self Care | Payer: Medicare Other | Source: Ambulatory Visit | Attending: Internal Medicine | Admitting: Internal Medicine

## 2019-05-28 ENCOUNTER — Other Ambulatory Visit: Payer: Self-pay | Admitting: Internal Medicine

## 2019-05-28 DIAGNOSIS — R109 Unspecified abdominal pain: Secondary | ICD-10-CM

## 2019-05-28 DIAGNOSIS — R1030 Lower abdominal pain, unspecified: Secondary | ICD-10-CM | POA: Diagnosis not present

## 2019-06-01 ENCOUNTER — Other Ambulatory Visit: Payer: Self-pay | Admitting: *Deleted

## 2019-06-01 MED ORDER — METOPROLOL TARTRATE 25 MG PO TABS: 25.0000 mg | ORAL_TABLET | Freq: Two times a day (BID) | ORAL | 1 refills | Status: DC

## 2019-06-01 NOTE — Telephone Encounter (Signed)
Rx has been sent to the pharmacy electronically. ° °

## 2019-07-10 NOTE — Progress Notes (Signed)
Cardiology Office Note    Date:  07/15/2019   ID:  Julie Lara, DOB 06-08-1920, MRN [id number]  PCP:  Burnard Bunting, MD  Cardiologist:  Dr. Martinique  Chief Complaint  Patient presents with  . Congestive Heart Failure  . Atrial Fibrillation    History of Present Illness:  Julie Lara is a 86 y.o. female with PMH of CAD (s/p BMS to RCA in 2014), PAF (not on systemic anticoagulation 2/2 GIB), moderate AI, hypothyroidism, and stage III CKD. She was admitted in April 2018 for evaluation chest pain. Heart rate was 100 at that time. Her Lopressor was increased to 25 mg twice a day. Serial troponin was negative. No further ischemic workup was pursued. She was readmitted  in November 2018 with CHF exacerbation. She was diuresed with IV Lasix. Troponin minimally elevated however had a flat trend. BNP was 940. She was in atrial fibrillation with RVR with heart rate of 120. Echocardiogram obtained on 02/02/2017 showed reduction of EF down to 35-40%, diffuse hypokinesis, moderate LVH, dilated ascending aorta at 43 mm, moderate AI, moderate MR, severely dilated left and right atrium, peak PA pressure 33 mmHg. Her discharge weight was up 127 pounds. She was taking 20 mg Lasix every other day due to renal insufficiency.   She was admitted in December 2018 with acute CHF. Diuresed with IV lasix. DC home on 40 mg daily. BNP elevated to 1583. CXR showed pulmonary edema. DC weight 127 lbs. Barium esophagram showed a stricture at the GE junction but she is asymptomatic from this.  On follow up today she is seen with her daughter. She notes increase swelling in her legs and feet. Gets SOB if she moves around much. No chest pain or palpitations.  She states weight at home has been stable between 127-130 lbs. On our scale weight is up 3 lbs.    Past Medical History:  Diagnosis Date  . Acute blood loss anemia    November 2014  . Aortic insufficiency    mild to moderate  . Arthritis    "right upper  arm" (02/01/2017)  . Atrial fibrillation ([place])   . CAD (coronary artery disease)   . Chest pain 04-2010   atypical with negative echocardiogram  . Dilated cardiomyopathy ([place])   . Goiter   . Hypothyroidism   . Idiopathic cardiomyopathy ([place]) 1999   EF 40% by Echo on 10/13/12  . NSTEMI (non-ST elevated myocardial infarction) [facility]) July 2014   95% prox RCA s/p BMS  . PAC (premature atrial contraction)    Chronic    Past Surgical History:  Procedure Laterality Date  . biopsy of sacral lesion  2010  . CARDIAC CATHETERIZATION    . COLONOSCOPY N/A 02/23/2013   Procedure: COLONOSCOPY;  Surgeon: Ladene Artist, MD;  Location: [facility] ENDOSCOPY;  Service: Endoscopy;  Laterality: N/A;  . CORONARY ANGIOPLASTY WITH STENT PLACEMENT  09/2012   BMS-95% prox RCA  . ESOPHAGOGASTRODUODENOSCOPY N/A 02/22/2013   Procedure: ESOPHAGOGASTRODUODENOSCOPY (EGD);  Surgeon: Milus Banister, MD;  Location: [facility];  Service: Endoscopy;  Laterality: N/A;  . LEFT HEART CATHETERIZATION WITH CORONARY ANGIOGRAM N/A 10/14/2012   Procedure: LEFT HEART CATHETERIZATION WITH CORONARY ANGIOGRAM;  Surgeon: Wellington Hampshire, MD;  Location: [facility] CATH LAB;  Service: Cardiovascular;  Laterality: N/A;  . PERCUTANEOUS CORONARY STENT INTERVENTION (PCI-S) Right 10/14/2012   Procedure: PERCUTANEOUS CORONARY STENT INTERVENTION (PCI-S);  Surgeon: Wellington Hampshire, MD;  Location: [facility] CATH LAB;  Service: Cardiovascular;  Laterality: Right;  .  THYROIDECTOMY  2008  . VAGINAL HYSTERECTOMY     Complete    Current Medications: Outpatient Medications Prior to Visit  Medication Sig Dispense Refill  . aspirin EC 81 MG tablet Take 81 mg by mouth daily.    Marland Kitchen atorvastatin (LIPITOR) 40 MG tablet Take 1 tablet (40 mg total) by mouth daily at 6 PM. 90 tablet 3  . Cyanocobalamin (VITAMIN B-12 PO) Take 1 tablet by mouth daily.    . furosemide (LASIX) 40 MG tablet Take 1 tablet (40 mg total) by mouth daily. 90 tablet 3  . iron polysaccharides (FERREX  150) 150 MG capsule Take 150 mg by mouth daily.     Marland Kitchen lactose free nutrition (BOOST) LIQD Take 237 mLs by mouth 3 (three) times daily between meals. Chocolate flavor    . levothyroxine (SYNTHROID, LEVOTHROID) 25 MCG tablet Take 25 mcg by mouth daily before breakfast.    . metoprolol tartrate (LOPRESSOR) 25 MG tablet Take 1 tablet (25 mg total) by mouth 2 (two) times daily. 180 tablet 1  . nitroGLYCERIN (NITROSTAT) 0.4 MG SL tablet Place 1 tablet (0.4 mg total) under the tongue every 5 (five) minutes as needed for chest pain. 25 tablet 3  . pantoprazole (PROTONIX) 40 MG tablet Take 1 tablet (40 mg total) by mouth daily at 6 (six) AM. 30 tablet 0   No facility-administered medications prior to visit.     Allergies:   Patient has no known allergies.   Social History   Socioeconomic History  . Marital status: Widowed    Spouse name: Not on file  . Number of children: Not on file  . Years of education: Not on file  . Highest education level: Not on file  Occupational History  . Occupation: Retired    Fish farm manager: [employer]  Tobacco Use  . Smoking status: Never Smoker  . Smokeless tobacco: Never Used  Substance and Sexual Activity  . Alcohol use: No  . Drug use: No  . Sexual activity: Not Currently  Other Topics Concern  . Not on file  Social History Narrative   Pt lives alone, but has family close by.   Social Determinants of Health   Financial Resource Strain:   . Difficulty of Paying Living Expenses:   Food Insecurity:   . Worried About Charity fundraiser in the Last Year:   . Arboriculturist in the Last Year:   Transportation Needs:   . Film/video editor (Medical):   Marland Kitchen Lack of Transportation (Non-Medical):   Physical Activity:   . Days of Exercise per Week:   . Minutes of Exercise per Session:   Stress:   . Feeling of Stress :   Social Connections:   . Frequency of Communication with Friends and Family:   . Frequency of Social Gatherings with Friends and  Family:   . Attends Religious Services:   . Active Member of Clubs or Organizations:   . Attends Archivist Meetings:   Marland Kitchen Marital Status:      Family History:  The patient's family history includes Diabetes in her sister.   ROS:   Please see the history of present illness.    ROS All other systems reviewed and are negative.   PHYSICAL EXAM:   VS:  BP 119/79   Pulse 95   Temp (!) 96.3 F (35.7 C)   Resp 18   Ht 5\' 2"  (1.575 m)   Wt 128 lb 12.8 oz (58.4 kg)  SpO2 95%   BMI 23.56 kg/m    GENERAL:  Well appearing, elderly WF in NAD HEENT:  PERRL, EOMI, sclera are clear. Oropharynx is clear. NECK:  No jugular venous distention, carotid upstroke brisk and symmetric, no bruits, no thyromegaly or adenopathy LUNGS:  Clear to auscultation bilaterally CHEST:  Unremarkable HEART:  IRRR,  PMI not displaced or sustained,S1 and S2 within normal limits, no S3, no S4: no clicks, no rubs, no murmurs ABD:  Soft, nontender. BS +, no masses or bruits. No hepatomegaly, no splenomegaly EXT:  2 + pulses throughout, 2+ edema, no cyanosis no clubbing SKIN:  Warm and dry.  No rashes NEURO:  Alert and oriented x 3. Cranial nerves II through XII intact. PSYCH:  Cognitively intact   Wt Readings from Last 3 Encounters:  07/15/19 128 lb 12.8 oz (58.4 kg)  01/02/19 125 lb (56.7 kg)  04/07/18 131 lb 3.2 oz (59.5 kg)      Studies/Labs Reviewed:   EKG:  EKG is ordered today.  Afib with rate 78. New RBBB.  ST-T wave abnormality c/w lateral ischemia. . I have personally reviewed and interpreted this study.   Recent Labs: No results found for requested labs within last 8760 hours.   Lipid Panel    Component Value Date/Time   CHOL 146 12/15/2012 1552   TRIG 54.0 12/15/2012 1552   HDL 64.10 12/15/2012 1552   CHOLHDL 2 12/15/2012 1552   VLDL 10.8 12/15/2012 1552   LDLCALC 71 12/15/2012 1552   Labs dated 12/19/16: cholesterol 150, triglycerides 77, HDL 64, LDL 71.  Dated 12/25/17:  cholesterol 136, triglycerides 67, HDL 55, LDL 68. Hgb 11.4. creatinine 1.4. Other chemistries and TSH normal. Dated 01/06/19: cholesterol 148, triglycerides 66,HDL 67, LDL 68. Hgb 11.3. creatinine 1.6. TSH 7.82. otherwise CBC and CMET normal   Additional studies/ records that were reviewed today include:   Echo 02/02/2017 LV EF: 35% -   40%  Study Conclusions  - Left ventricle: The cavity size was normal. Wall thickness was   increased in a pattern of moderate LVH. Systolic function was   moderately reduced. The estimated ejection fraction was in the   range of 35% to 40%. Diffuse hypokinesis. The study is not   technically sufficient to allow evaluation of LV diastolic   function. - Aortic valve: Trileaflet; mildly calcified leaflets. There was   moderate regurgitation. - Aorta: Ascending aortic diameter: 43 mm (S). - Ascending aorta: The ascending aorta was mildly dilated. - Mitral valve: Mildly thickened leaflets . There was moderate   regurgitation. - Left atrium: Severely dilated. - Right ventricle: The cavity size was mildly dilated. Systolic   function is mildly reduced. - Right atrium: Severely dilated. - Atrial septum: Bows from left to right, suggesting of high LA >   RA filling pressure. - Tricuspid valve: There was mild regurgitation. - Pulmonary arteries: PA peak pressure: 33 mm Hg (S). - Inferior vena cava: The vessel was dilated. The respirophasic   diameter changes were blunted (< 50%), consistent with elevated   central venous pressure.  Impressions:  - Compared to a prior study in 2014, the LVEF is lower at 35-40%   with diffuse hypokinesis, moderate AI and MR, mild TR, severe   biatrial enlargement. Rhythm appears to be atrial fibrillation.   ASSESSMENT:    1. Chronic systolic congestive heart failure ([place])   2. Moderate aortic regurgitation   3. Coronary artery disease with history of myocardial infarction without history of CABG  4. Persistent  atrial fibrillation (HCC)   5. Stage 3a chronic kidney disease      PLAN:  In order of problems listed above:  1. CAD: asymptomatic, continue aspirin, Lipitor, and metoprolol. Ecg does show a new RBBB and increased lateral TW inversion.   2. Persistent atrial fibrillation/ permanent: HR is well controlled on Toprol. She is not a candidate for long term anticoagulation due to history of GI bleed. On ASA.   3. Chronic systolic heart failure: EF 35-40% on recent echocardiogram. She has increased weight and edema. I have recommended increasing lasix to 40 mg bid. Check BMET and BNP in 1-2 weeks.   4. Moderate aortic regurgitation: Stable, continue observation.  5. Hypothyroidism: On Synthroid, last TSH elevated. Will repeat with lab work.  6. CKD stage III stable- monitor with increase diuretics.   7.   Gout on steroids.     Medication Adjustments/Labs and Tests Ordered: Current medicines are reviewed at length with the patient today.  Concerns regarding medicines are outlined above.  Medication changes, Labs and Tests ordered today are listed in the Patient Instructions below. Patient Instructions  Increase lasix to 40 mg twice a day  We will repeat blood work in 1-2 weeks.  Restrict salt intake    Low-Sodium Eating Plan Sodium, which is an element that makes up salt, helps you maintain a healthy balance of fluids in your body. Too much sodium can increase your blood pressure and cause fluid and waste to be held in your body. Your health care provider or dietitian may recommend following this plan if you have high blood pressure (hypertension), kidney disease, liver disease, or heart failure. Eating less sodium can help lower your blood pressure, reduce swelling, and protect your heart, liver, and kidneys. What are tips for following this plan? General guidelines  Most people on this plan should limit their sodium intake to 1,500-2,000 mg (milligrams) of sodium each day. Reading  food labels   The Nutrition Facts label lists the amount of sodium in one serving of the food. If you eat more than one serving, you must multiply the listed amount of sodium by the number of servings.  Choose foods with less than 140 mg of sodium per serving.  Avoid foods with 300 mg of sodium or more per serving. Shopping  Look for lower-sodium products, often labeled as "low-sodium" or "no salt added."  Always check the sodium content even if foods are labeled as "unsalted" or "no salt added".  Buy fresh foods. ? Avoid canned foods and premade or frozen meals. ? Avoid canned, cured, or processed meats  Buy breads that have less than 80 mg of sodium per slice. Cooking  Eat more home-cooked food and less restaurant, buffet, and fast food.  Avoid adding salt when cooking. Use salt-free seasonings or herbs instead of table salt or sea salt. Check with your health care provider or pharmacist before using salt substitutes.  Cook with plant-based oils, such as canola, sunflower, or olive oil. Meal planning  When eating at a restaurant, ask that your food be prepared with less salt or no salt, if possible.  Avoid foods that contain MSG (monosodium glutamate). MSG is sometimes added to [place] food, bouillon, and some canned foods. What foods are recommended? The items listed may not be a complete list. Talk with your dietitian about what dietary choices are best for you. Grains Low-sodium cereals, including oats, puffed wheat and rice, and shredded wheat. Low-sodium crackers. Unsalted rice. Unsalted  pasta. Low-sodium bread. Whole-grain breads and whole-grain pasta. Vegetables Fresh or frozen vegetables. "No salt added" canned vegetables. "No salt added" tomato sauce and paste. Low-sodium or reduced-sodium tomato and vegetable juice. Fruits Fresh, frozen, or canned fruit. Fruit juice. Meats and other protein foods Fresh or frozen (no salt added) meat, poultry, seafood, and fish.  Low-sodium canned tuna and salmon. Unsalted nuts. Dried peas, beans, and lentils without added salt. Unsalted canned beans. Eggs. Unsalted nut butters. Dairy Milk. Soy milk. Cheese that is naturally low in sodium, such as ricotta cheese, fresh mozzarella, or Swiss cheese Low-sodium or reduced-sodium cheese. Cream cheese. Yogurt. Fats and oils Unsalted butter. Unsalted margarine with no trans fat. Vegetable oils such as canola or olive oils. Seasonings and other foods Fresh and dried herbs and spices. Salt-free seasonings. Low-sodium mustard and ketchup. Sodium-free salad dressing. Sodium-free light mayonnaise. Fresh or refrigerated horseradish. Lemon juice. Vinegar. Homemade, reduced-sodium, or low-sodium soups. Unsalted popcorn and pretzels. Low-salt or salt-free chips. What foods are not recommended? The items listed may not be a complete list. Talk with your dietitian about what dietary choices are best for you. Grains Instant hot cereals. Bread stuffing, pancake, and biscuit mixes. Croutons. Seasoned rice or pasta mixes. Noodle soup cups. Boxed or frozen macaroni and cheese. Regular salted crackers. Self-rising flour. Vegetables Sauerkraut, pickled vegetables, and relishes. Olives. [place] fries. Onion rings. Regular canned vegetables (not low-sodium or reduced-sodium). Regular canned tomato sauce and paste (not low-sodium or reduced-sodium). Regular tomato and vegetable juice (not low-sodium or reduced-sodium). Frozen vegetables in sauces. Meats and other protein foods Meat or fish that is salted, canned, smoked, spiced, or pickled. Bacon, ham, sausage, hotdogs, corned beef, chipped beef, packaged lunch meats, salt pork, jerky, pickled herring, anchovies, regular canned tuna, sardines, salted nuts. Dairy Processed cheese and cheese spreads. Cheese curds. Blue cheese. Feta cheese. String cheese. Regular cottage cheese. Buttermilk. Canned milk. Fats and oils Salted butter. Regular margarine.  Ghee. Bacon fat. Seasonings and other foods Onion salt, garlic salt, seasoned salt, table salt, and sea salt. Canned and packaged gravies. Worcestershire sauce. Tartar sauce. Barbecue sauce. Teriyaki sauce. Soy sauce, including reduced-sodium. Steak sauce. Fish sauce. Oyster sauce. Cocktail sauce. Horseradish that you find on the shelf. Regular ketchup and mustard. Meat flavorings and tenderizers. Bouillon cubes. Hot sauce and Tabasco sauce. Premade or packaged marinades. Premade or packaged taco seasonings. Relishes. Regular salad dressings. Salsa. Potato and tortilla chips. Corn chips and puffs. Salted popcorn and pretzels. Canned or dried soups. Pizza. Frozen entrees and pot pies. Summary  Eating less sodium can help lower your blood pressure, reduce swelling, and protect your heart, liver, and kidneys.  Most people on this plan should limit their sodium intake to 1,500-2,000 mg (milligrams) of sodium each day.  Canned, boxed, and frozen foods are high in sodium. Restaurant foods, fast foods, and pizza are also very high in sodium. You also get sodium by adding salt to food.  Try to cook at home, eat more fresh fruits and vegetables, and eat less fast food, canned, processed, or prepared foods. This information is not intended to replace advice given to you by your health care provider. Make sure you discuss any questions you have with your health care provider. Document Revised: 03/01/2017 Document Reviewed: 03/12/2016 Elsevier Patient Education  2020 [place], Rethel Sebek Martinique, MD  07/15/2019 10:14 AM    [place]

## 2019-07-15 ENCOUNTER — Encounter: Payer: Self-pay | Admitting: Cardiology

## 2019-07-15 ENCOUNTER — Other Ambulatory Visit: Payer: Self-pay

## 2019-07-15 ENCOUNTER — Ambulatory Visit (INDEPENDENT_AMBULATORY_CARE_PROVIDER_SITE_OTHER): Payer: Medicare Other | Admitting: Cardiology

## 2019-07-15 VITALS — BP 119/79 | HR 95 | Temp 96.3°F | Resp 18 | Ht 62.0 in | Wt 128.8 lb

## 2019-07-15 DIAGNOSIS — I351 Nonrheumatic aortic (valve) insufficiency: Secondary | ICD-10-CM | POA: Diagnosis not present

## 2019-07-15 DIAGNOSIS — I252 Old myocardial infarction: Secondary | ICD-10-CM

## 2019-07-15 DIAGNOSIS — N1831 Chronic kidney disease, stage 3a: Secondary | ICD-10-CM | POA: Diagnosis not present

## 2019-07-15 DIAGNOSIS — I5022 Chronic systolic (congestive) heart failure: Secondary | ICD-10-CM

## 2019-07-15 DIAGNOSIS — I2510 Atherosclerotic heart disease of native coronary artery without angina pectoris: Secondary | ICD-10-CM

## 2019-07-15 DIAGNOSIS — I4819 Other persistent atrial fibrillation: Secondary | ICD-10-CM | POA: Diagnosis not present

## 2019-07-15 MED ORDER — FUROSEMIDE 40 MG PO TABS: 40.0000 mg | ORAL_TABLET | Freq: Two times a day (BID) | ORAL | 3 refills | Status: DC

## 2019-07-15 NOTE — Patient Instructions (Signed)
Increase lasix to 40 mg twice a day  We will repeat blood work in 1-2 weeks.  Restrict salt intake    Low-Sodium Eating Plan Sodium, which is an element that makes up salt, helps you maintain a healthy balance of fluids in your body. Too much sodium can increase your blood pressure and cause fluid and waste to be held in your body. Your health care provider or dietitian may recommend following this plan if you have high blood pressure (hypertension), kidney disease, liver disease, or heart failure. Eating less sodium can help lower your blood pressure, reduce swelling, and protect your heart, liver, and kidneys. What are tips for following this plan? General guidelines  Most people on this plan should limit their sodium intake to 1,500-2,000 mg (milligrams) of sodium each day. Reading food labels   The Nutrition Facts label lists the amount of sodium in one serving of the food. If you eat more than one serving, you must multiply the listed amount of sodium by the number of servings.  Choose foods with less than 140 mg of sodium per serving.  Avoid foods with 300 mg of sodium or more per serving. Shopping  Look for lower-sodium products, often labeled as "low-sodium" or "no salt added."  Always check the sodium content even if foods are labeled as "unsalted" or "no salt added".  Buy fresh foods. ? Avoid canned foods and premade or frozen meals. ? Avoid canned, cured, or processed meats  Buy breads that have less than 80 mg of sodium per slice. Cooking  Eat more home-cooked food and less restaurant, buffet, and fast food.  Avoid adding salt when cooking. Use salt-free seasonings or herbs instead of table salt or sea salt. Check with your health care provider or pharmacist before using salt substitutes.  Cook with plant-based oils, such as canola, sunflower, or olive oil. Meal planning  When eating at a restaurant, ask that your food be prepared with less salt or no salt, if  possible.  Avoid foods that contain MSG (monosodium glutamate). MSG is sometimes added to [place] food, bouillon, and some canned foods. What foods are recommended? The items listed may not be a complete list. Talk with your dietitian about what dietary choices are best for you. Grains Low-sodium cereals, including oats, puffed wheat and rice, and shredded wheat. Low-sodium crackers. Unsalted rice. Unsalted pasta. Low-sodium bread. Whole-grain breads and whole-grain pasta. Vegetables Fresh or frozen vegetables. "No salt added" canned vegetables. "No salt added" tomato sauce and paste. Low-sodium or reduced-sodium tomato and vegetable juice. Fruits Fresh, frozen, or canned fruit. Fruit juice. Meats and other protein foods Fresh or frozen (no salt added) meat, poultry, seafood, and fish. Low-sodium canned tuna and salmon. Unsalted nuts. Dried peas, beans, and lentils without added salt. Unsalted canned beans. Eggs. Unsalted nut butters. Dairy Milk. Soy milk. Cheese that is naturally low in sodium, such as ricotta cheese, fresh mozzarella, or Swiss cheese Low-sodium or reduced-sodium cheese. Cream cheese. Yogurt. Fats and oils Unsalted butter. Unsalted margarine with no trans fat. Vegetable oils such as canola or olive oils. Seasonings and other foods Fresh and dried herbs and spices. Salt-free seasonings. Low-sodium mustard and ketchup. Sodium-free salad dressing. Sodium-free light mayonnaise. Fresh or refrigerated horseradish. Lemon juice. Vinegar. Homemade, reduced-sodium, or low-sodium soups. Unsalted popcorn and pretzels. Low-salt or salt-free chips. What foods are not recommended? The items listed may not be a complete list. Talk with your dietitian about what dietary choices are best for you. Grains Instant hot  cereals. Bread stuffing, pancake, and biscuit mixes. Croutons. Seasoned rice or pasta mixes. Noodle soup cups. Boxed or frozen macaroni and cheese. Regular salted crackers.  Self-rising flour. Vegetables Sauerkraut, pickled vegetables, and relishes. Olives. [place] fries. Onion rings. Regular canned vegetables (not low-sodium or reduced-sodium). Regular canned tomato sauce and paste (not low-sodium or reduced-sodium). Regular tomato and vegetable juice (not low-sodium or reduced-sodium). Frozen vegetables in sauces. Meats and other protein foods Meat or fish that is salted, canned, smoked, spiced, or pickled. Bacon, ham, sausage, hotdogs, corned beef, chipped beef, packaged lunch meats, salt pork, jerky, pickled herring, anchovies, regular canned tuna, sardines, salted nuts. Dairy Processed cheese and cheese spreads. Cheese curds. Blue cheese. Feta cheese. String cheese. Regular cottage cheese. Buttermilk. Canned milk. Fats and oils Salted butter. Regular margarine. Ghee. Bacon fat. Seasonings and other foods Onion salt, garlic salt, seasoned salt, table salt, and sea salt. Canned and packaged gravies. Worcestershire sauce. Tartar sauce. Barbecue sauce. Teriyaki sauce. Soy sauce, including reduced-sodium. Steak sauce. Fish sauce. Oyster sauce. Cocktail sauce. Horseradish that you find on the shelf. Regular ketchup and mustard. Meat flavorings and tenderizers. Bouillon cubes. Hot sauce and Tabasco sauce. Premade or packaged marinades. Premade or packaged taco seasonings. Relishes. Regular salad dressings. Salsa. Potato and tortilla chips. Corn chips and puffs. Salted popcorn and pretzels. Canned or dried soups. Pizza. Frozen entrees and pot pies. Summary  Eating less sodium can help lower your blood pressure, reduce swelling, and protect your heart, liver, and kidneys.  Most people on this plan should limit their sodium intake to 1,500-2,000 mg (milligrams) of sodium each day.  Canned, boxed, and frozen foods are high in sodium. Restaurant foods, fast foods, and pizza are also very high in sodium. You also get sodium by adding salt to food.  Try to cook at home, eat  more fresh fruits and vegetables, and eat less fast food, canned, processed, or prepared foods. This information is not intended to replace advice given to you by your health care provider. Make sure you discuss any questions you have with your health care provider. Document Revised: 03/01/2017 Document Reviewed: 03/12/2016 Elsevier Patient Education  2020 [employer].

## 2019-07-20 DIAGNOSIS — I5022 Chronic systolic (congestive) heart failure: Secondary | ICD-10-CM | POA: Diagnosis not present

## 2019-07-20 DIAGNOSIS — R600 Localized edema: Secondary | ICD-10-CM | POA: Diagnosis not present

## 2019-07-20 DIAGNOSIS — I1310 Hypertensive heart and chronic kidney disease without heart failure, with stage 1 through stage 4 chronic kidney disease, or unspecified chronic kidney disease: Secondary | ICD-10-CM | POA: Diagnosis not present

## 2019-07-20 DIAGNOSIS — N184 Chronic kidney disease, stage 4 (severe): Secondary | ICD-10-CM | POA: Diagnosis not present

## 2019-08-04 DIAGNOSIS — N1831 Chronic kidney disease, stage 3a: Secondary | ICD-10-CM | POA: Diagnosis not present

## 2019-08-04 DIAGNOSIS — I2510 Atherosclerotic heart disease of native coronary artery without angina pectoris: Secondary | ICD-10-CM | POA: Diagnosis not present

## 2019-08-04 DIAGNOSIS — I5022 Chronic systolic (congestive) heart failure: Secondary | ICD-10-CM | POA: Diagnosis not present

## 2019-08-04 DIAGNOSIS — I351 Nonrheumatic aortic (valve) insufficiency: Secondary | ICD-10-CM | POA: Diagnosis not present

## 2019-08-04 DIAGNOSIS — I4819 Other persistent atrial fibrillation: Secondary | ICD-10-CM | POA: Diagnosis not present

## 2019-08-04 DIAGNOSIS — I252 Old myocardial infarction: Secondary | ICD-10-CM | POA: Diagnosis not present

## 2019-08-05 LAB — BASIC METABOLIC PANEL
BUN/Creatinine Ratio: 20 (ref 12–28)
BUN: 29 mg/dL (ref 10–36)
CO2: 25 mmol/L (ref 20–29)
Calcium: 9.7 mg/dL (ref 8.7–10.3)
Chloride: 101 mmol/L (ref 96–106)
Creatinine, Ser: 1.42 mg/dL — ABNORMAL HIGH (ref 0.57–1.00)
GFR calc Af Amer: 36 mL/min/{1.73_m2} — ABNORMAL LOW (ref >59)
GFR calc non Af Amer: 31 mL/min/{1.73_m2} — ABNORMAL LOW (ref >59)
Glucose: 81 mg/dL (ref 65–99)
Potassium: 4.2 mmol/L (ref 3.5–5.2)
Sodium: 143 mmol/L (ref 134–144)

## 2019-08-05 LAB — TSH: TSH: 6.22 u[IU]/mL — ABNORMAL HIGH (ref 0.450–4.500)

## 2019-08-05 LAB — BRAIN NATRIURETIC PEPTIDE: BNP: 423.3 pg/mL — ABNORMAL HIGH (ref 0.0–100.0)

## 2019-08-05 LAB — T4, FREE: Free T4: 1.45 ng/dL (ref 0.82–1.77)

## 2019-08-06 NOTE — Progress Notes (Signed)
Cardiology Office Note   Date:  08/10/2019   ID:  Julie Lara, DOB 08-Oct-1920, MRN [id number]  PCP:  Burnard Bunting, MD  Cardiologist:  Dr. Martinique CC:    History of Present Illness: Julie Lara is a 86 y.o. female who presents for ongoing assessment and management of CAD (status post bare-metal stent to the RCA in 2014), paroxysmal atrial fibrillation not on anticoagulation in the setting of GI bleed, moderate IAI, with other history to include hypothyroidism, stage III cardiac kidney disease.  She was seen in the ED in April 2018 for evaluation of recurrent chest pain.  She was tachycardic at that time.  Her Lopressor was increased to 25 mg twice daily and she was ruled out for ACS at the time.  She did have a second ED evaluation in 2018 with CHF exacerbation.  She was diuresed with IV Lasix.  At that time she was found to be in atrial fibrillation with RVR with a heart rate of 120 bpm.  Echocardiogram in 01/2017 showed reduction of EF of 35% to 40% with diffuse hypokinesis, moderate LVH, dilated ascending aorta at 43 mm, moderate AI, moderate MR, severely dilated left and right atrium, with a peak PA pressure of 33 mmHg.  She was diuresed with a discharge weight at 127 pounds.  She was to take Lasix every other day 20 mg due to renal insufficiency.  She had recurrent admission a month later for acute CHF and was diuresed again.  Lasix was increased to 40 mg daily.  She was last seen by Dr. Martinique on 07/15/2019.  At that time the EKG revealed a new right bundle branch block and increased lateral T wave inversion.  Her heart rate was well controlled on metoprolol.  She again was not placed on anticoagulation due to history of GI bleed.    She was found to have some volume overload and therefore she was increased on her Lasix to 40 mg twice daily with follow-up BMET and BNP in 1 to 2 weeks.  She is here for follow-up to ascertain her response to treatment.  She comes today feeling much  better.  Lower extremity edema has improved.  She denies any dizziness with positional change, shortness of breath, muscle cramps, recurrent chest pain or dyspnea.  Her family is worried about her that she is not consistently wearing her life alert.  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  Past Medical History:  Diagnosis Date  . Acute blood loss anemia    November 2014  . Aortic insufficiency    mild to moderate  . Arthritis    "right upper arm" (02/01/2017)  . Atrial fibrillation ([place])   . CAD (coronary artery disease)   . Chest pain 04-2010   atypical with negative echocardiogram  . Dilated cardiomyopathy ([place])   . Goiter   . Hypothyroidism   . Idiopathic cardiomyopathy ([place]) 1999   EF 40% by Echo on 10/13/12  . NSTEMI (non-ST elevated myocardial infarction) [facility]) July 2014   95% prox RCA s/p BMS  . PAC (premature atrial contraction)    Chronic    Past Surgical History:  Procedure Laterality Date  . biopsy of sacral lesion  2010  . CARDIAC CATHETERIZATION    . COLONOSCOPY N/A 02/23/2013   Procedure: COLONOSCOPY;  Surgeon: Ladene Artist, MD;  Location[facility] ENDOSCOPY;  Service: Endoscopy;  Laterality: N/A;  . CORONARY ANGIOPLASTY WITH STENT PLACEMENT  09/2012   BMS-95% prox RCA  . ESOPHAGOGASTRODUODENOSCOPY N/A 02/22/2013   Procedure: ESOPHAGOGASTRODUODENOSCOPY (EGD);  Surgeon: Milus Banister, MD;  Location: [place];  Service: Endoscopy;  Laterality: N/A;  . LEFT HEART CATHETERIZATION WITH CORONARY ANGIOGRAM N/A 10/14/2012   Procedure: LEFT HEART CATHETERIZATION WITH CORONARY ANGIOGRAM;  Surgeon: Wellington Hampshire, MD;  Location: [facility] CATH LAB;  Service: Cardiovascular;  Laterality: N/A;  . PERCUTANEOUS CORONARY STENT INTERVENTION (PCI-S) Right 10/14/2012   Procedure: PERCUTANEOUS CORONARY STENT INTERVENTION (PCI-S);  Surgeon: Wellington Hampshire, MD;  Location: [facility] CATH LAB;  Service:  Cardiovascular;  Laterality: Right;  . THYROIDECTOMY  2008  . VAGINAL HYSTERECTOMY     Complete     Current Outpatient Medications  Medication Sig Dispense Refill  . aspirin EC 81 MG tablet Take 81 mg by mouth daily.    Marland Kitchen atorvastatin (LIPITOR) 40 MG tablet Take 1 tablet (40 mg total) by mouth daily at 6 PM. 90 tablet 3  . Cyanocobalamin (VITAMIN B-12 PO) Take 1 tablet by mouth daily.    . furosemide (LASIX) 40 MG tablet Take 1 tablet (40 mg total) by mouth 2 (two) times daily. 180 tablet 3  . iron polysaccharides (FERREX 150) 150 MG capsule Take 150 mg by mouth daily.     Marland Kitchen lactose free nutrition (BOOST) LIQD Take 237 mLs by mouth 3 (three) times daily between meals. Chocolate flavor    . levothyroxine (SYNTHROID, LEVOTHROID) 25 MCG tablet Take 25 mcg by mouth daily before breakfast.    . metoprolol tartrate (LOPRESSOR) 25 MG tablet Take 1 tablet (25 mg total) by mouth 2 (two) times daily. 180 tablet 1  . nitroGLYCERIN (NITROSTAT) 0.4 MG SL tablet Place 1 tablet (0.4 mg total) under the tongue every 5 (five) minutes as needed for chest pain. 25 tablet 3  . pantoprazole (PROTONIX) 40 MG tablet Take 1 tablet (40 mg total) by mouth daily at 6 (six) AM. 30 tablet 0   No current facility-administered medications for this visit.    Allergies:   Patient has no known allergies.    Social History:  The patient  reports that she has never smoked. She has never used smokeless tobacco. She reports that she does not drink alcohol or use drugs.   Family History:  The patient's family history includes Diabetes in her sister.    ROS: All other systems are reviewed and negative. Unless otherwise mentioned in H&P    PHYSICAL EXAM: VS:  BP 113/74   Pulse 79   Ht 5\' 2"  (1.575 m)   Wt 127 lb 6.4 oz (57.8 kg)   BMI 23.30 kg/m  , BMI Body mass index is 23.3 kg/m. GEN: Well nourished, well developed, in no acute distress HEENT: normal Neck: no JVD, carotid bruits, or masses Cardiac: IRRR 2/6  systolic murmur heard best at the LSB,; no murmurs, rubs, or gallops, mild dependent lower extremity edema  Respiratory:  Clear to auscultation bilaterally, normal work of breathing GI: soft, nontender, nondistended, + BS MS: no deformity or atrophy Skin: warm and dry, no rash Neuro:  Strength and sensation are intact Psych: euthymic mood, full affect   EKG: Not completed this office visit  Recent Labs: 08/04/2019: BNP 423.3; BUN 29; Creatinine, Ser 1.42; Potassium 4.2; Sodium 143; TSH 6.220    Lipid Panel    Component Value Date/Time   CHOL 146 12/15/2012 1552   TRIG 54.0 12/15/2012 1552   HDL  64.10 12/15/2012 1552   CHOLHDL 2 12/15/2012 1552   VLDL 10.8 12/15/2012 1552   LDLCALC 71 12/15/2012 1552      Wt Readings from Last 3 Encounters:  08/10/19 127 lb 6.4 oz (57.8 kg)  07/15/19 128 lb 12.8 oz (58.4 kg)  01/02/19 125 lb (56.7 kg)      Other studies Reviewed: Echo 02/02/2017 LV EF: 35% - 40%  Study Conclusions  - Left ventricle: The cavity size was normal. Wall thickness was increased in a pattern of moderate LVH. Systolic function was moderately reduced. The estimated ejection fraction was in the range of 35% to 40%. Diffuse hypokinesis. The study is not technically sufficient to allow evaluation of LV diastolic function. - Aortic valve: Trileaflet; mildly calcified leaflets. There was moderate regurgitation. - Aorta: Ascending aortic diameter: 43 mm (S). - Ascending aorta: The ascending aorta was mildly dilated. - Mitral valve: Mildly thickened leaflets . There was moderate regurgitation. - Left atrium: Severely dilated. - Right ventricle: The cavity size was mildly dilated. Systolic function is mildly reduced. - Right atrium: Severely dilated. - Atrial septum: Bows from left to right, suggesting of high LA > RA filling pressure. - Tricuspid valve: There was mild regurgitation. - Pulmonary arteries: PA peak pressure: 33 mm Hg (S). -  Inferior vena cava: The vessel was dilated. The respirophasic diameter changes were blunted (<50%), consistent with elevated central venous pressure.  Impressions:  - Compared to a prior study in 2014, the LVEF is lower at 35-40% with diffuse hypokinesis, moderate AI and MR, mild TR, severe biatrial enlargement. Rhythm appears to be atrial fibrillation.    ASSESSMENT AND PLAN:   1.  Coronary artery disease: History of bare-metal stent to the RCA in 2014.  She denies recurrent discomfort.  She remains active but does have trouble with ambulation due to arthritis and uses a cane.  However she is caring for herself and denies any cardiac symptoms.  2.  Chronic systolic CHF.  She has done well with higher doses of Lasix 40 mg twice daily.  I have reviewed her lab work and she is stable.  I have advised her to take the diuretic on an empty stomach and wait at least 30 minutes before eating.  She does take Synthroid in the morning and needs to take that on an empty stomach as well.  Therefore she will begin to take her Lasix that way.  3.  Atrial fibrillation: Does not have any complaints of rapid heart rhythm or frequent palpitations.  She is not on anticoagulation due to history of GI bleed.  Rate is controlled.  Current medicines are reviewed at length with the patient today.  I have spent 25 minutes dedicated to the care of this patient on the date of this encounter to include pre-visit review of records, assessment, management and diagnostic testing,with shared decision making.  Labs/ tests ordered today include: None Phill Myron. West Pugh, ANP, AACC   08/10/2019 1:21 PM    [facility] [address] Office [phone] Fax [phone]  Notice: This dictation was prepared with Dragon dictation along with smaller phrase technology. Any transcriptional errors that result from this process are unintentional and may not be corrected upon  review.

## 2019-08-10 ENCOUNTER — Other Ambulatory Visit: Payer: Self-pay

## 2019-08-10 ENCOUNTER — Encounter: Payer: Self-pay | Admitting: Adult Health

## 2019-08-10 ENCOUNTER — Ambulatory Visit (INDEPENDENT_AMBULATORY_CARE_PROVIDER_SITE_OTHER): Payer: Medicare Other | Admitting: Adult Health

## 2019-08-10 VITALS — BP 113/74 | HR 79 | Ht 62.0 in | Wt 127.4 lb

## 2019-08-10 DIAGNOSIS — I2510 Atherosclerotic heart disease of native coronary artery without angina pectoris: Secondary | ICD-10-CM | POA: Diagnosis not present

## 2019-08-10 DIAGNOSIS — I4821 Permanent atrial fibrillation: Secondary | ICD-10-CM | POA: Diagnosis not present

## 2019-08-10 DIAGNOSIS — I5022 Chronic systolic (congestive) heart failure: Secondary | ICD-10-CM | POA: Diagnosis not present

## 2019-08-10 DIAGNOSIS — I252 Old myocardial infarction: Secondary | ICD-10-CM

## 2019-08-10 DIAGNOSIS — E039 Hypothyroidism, unspecified: Secondary | ICD-10-CM | POA: Diagnosis not present

## 2019-08-10 NOTE — Patient Instructions (Signed)
Medication Instructions:  Continue current medications  *If you need a refill on your cardiac medications before your next appointment, please call your pharmacy*   Lab Work: None Ordered   Testing/Procedures: None Ordered   Follow-Up: At [facility], you and your health needs are our priority.  As part of our continuing mission to provide you with exceptional heart care, we have created designated Provider Care Teams.  These Care Teams include your primary Cardiologist (physician) and Advanced Practice Providers (APPs -  Physician Assistants and Nurse Practitioners) who all work together to provide you with the care you need, when you need it.  We recommend signing up for the patient portal called "MyChart".  Sign up information is provided on this After Visit Summary.  MyChart is used to connect with patients for Virtual Visits (Telemedicine).  Patients are able to view lab/test results, encounter notes, upcoming appointments, etc.  Non-urgent messages can be sent to your provider as well.   To learn more about what you can do with MyChart, go to [email or web address].    Your next appointment:   6 month(s)  The format for your next appointment:   In Person  Provider:   You may see Peter Jordan, MD or one of the following Advanced Practice Providers on your designated Care Team:    Hao Meng, PA-C  Angela Duke, PA-C or   Krista Kroeger, PA-C      

## 2019-09-06 ENCOUNTER — Encounter (HOSPITAL_COMMUNITY): Payer: Self-pay

## 2019-09-06 ENCOUNTER — Emergency Department (HOSPITAL_COMMUNITY)
Admission: EM | Admit: 2019-09-06 | Discharge: 2019-09-06 | Disposition: A | Discharge status: Home / Self Care | Payer: Medicare Other | Attending: Emergency Medicine | Admitting: Emergency Medicine

## 2019-09-06 ENCOUNTER — Other Ambulatory Visit: Payer: Self-pay

## 2019-09-06 DIAGNOSIS — Z7982 Long term (current) use of aspirin: Secondary | ICD-10-CM | POA: Insufficient documentation

## 2019-09-06 DIAGNOSIS — E039 Hypothyroidism, unspecified: Secondary | ICD-10-CM | POA: Insufficient documentation

## 2019-09-06 DIAGNOSIS — I2510 Atherosclerotic heart disease of native coronary artery without angina pectoris: Secondary | ICD-10-CM | POA: Diagnosis not present

## 2019-09-06 DIAGNOSIS — Y939 Activity, unspecified: Secondary | ICD-10-CM | POA: Diagnosis not present

## 2019-09-06 DIAGNOSIS — Z79899 Other long term (current) drug therapy: Secondary | ICD-10-CM | POA: Diagnosis not present

## 2019-09-06 DIAGNOSIS — N183 Chronic kidney disease, stage 3 unspecified: Secondary | ICD-10-CM | POA: Diagnosis not present

## 2019-09-06 DIAGNOSIS — W230XXA Caught, crushed, jammed, or pinched between moving objects, initial encounter: Secondary | ICD-10-CM | POA: Diagnosis not present

## 2019-09-06 DIAGNOSIS — I5022 Chronic systolic (congestive) heart failure: Secondary | ICD-10-CM | POA: Diagnosis not present

## 2019-09-06 DIAGNOSIS — S81812A Laceration without foreign body, left lower leg, initial encounter: Secondary | ICD-10-CM | POA: Diagnosis not present

## 2019-09-06 DIAGNOSIS — Y929 Unspecified place or not applicable: Secondary | ICD-10-CM | POA: Diagnosis not present

## 2019-09-06 DIAGNOSIS — I130 Hypertensive heart and chronic kidney disease with heart failure and stage 1 through stage 4 chronic kidney disease, or unspecified chronic kidney disease: Secondary | ICD-10-CM | POA: Diagnosis not present

## 2019-09-06 DIAGNOSIS — Y999 Unspecified external cause status: Secondary | ICD-10-CM | POA: Diagnosis not present

## 2019-09-06 NOTE — Discharge Instructions (Addendum)
You were seen in the emergency department for evaluation of a skin tear to your left lower leg.  The area was Steri-Stripped to help keep it in place.  You may use soap and water to the area.  Follow-up with your doctor and return to the emergency department if any signs of infection or other concerns.

## 2019-09-06 NOTE — ED Provider Notes (Signed)
[facility] DEPT Provider Note   CSN: [id number] Arrival date & time: 09/06/19  1925     History Chief Complaint  Patient presents with  . Laceration    Julie Lara is a 86 y.o. female.  She is here with evaluation of skin tear to her left lower leg after she caught it on the edge of a car door.  This occurred a few hours ago.  Applied some Neosporin and a dressing to it.  Tetanus is up-to-date.  No foreign body sensation.  Denies any other complaints.  The history is provided by the patient.  Laceration Location:  Leg Leg laceration location:  L lower leg Length:  6 Depth:  Cutaneous Quality: avulsion   Bleeding: controlled   Laceration mechanism:  Metal edge Pain details:    Quality:  Dull   Severity:  Mild   Progression:  Unchanged Foreign body present:  No foreign bodies Relieved by:  Nothing Worsened by:  Nothing Ineffective treatments:  None tried Tetanus status:  Up to date Associated symptoms: no fever and no redness        Past Medical History:  Diagnosis Date  . Acute blood loss anemia    November 2014  . Aortic insufficiency    mild to moderate  . Arthritis    "right upper arm" (02/01/2017)  . Atrial fibrillation ([place])   . CAD (coronary artery disease)   . Chest pain 04-2010   atypical with negative echocardiogram  . Dilated cardiomyopathy ([place])   . Goiter   . Hypothyroidism   . Idiopathic cardiomyopathy ([place]) 1999   EF 40% by Echo on 10/13/12  . NSTEMI (non-ST elevated myocardial infarction) [facility]) July 2014   95% prox RCA s/p BMS  . PAC (premature atrial contraction)    Chronic    Patient Active Problem List   Diagnosis Date Noted  . Essential hypertension 02/11/2017  . Acute on chronic systolic CHF (congestive heart failure) ([place]) 02/01/2017  . HLD (hyperlipidemia) 02/01/2017  . Atrial fibrillation with RVR ([place])   . Chest pain, rule out acute myocardial infarction 07/18/2016  . Acute blood loss anemia  03/12/2013  . CAD (coronary artery disease)   . Benign neoplasm of colon 02/23/2013  . Nonspecific abnormal finding in stool contents 02/23/2013  . Near syncope 02/21/2013  . Anemia 02/21/2013  . Acute on chronic renal failure ([place]) 02/21/2013  . Chronic systolic congestive heart failure ([place]) 02/21/2013  . GI bleed 02/21/2013  . Chronic anticoagulation 02/21/2013  . Coronary artery disease with history of myocardial infarction without history of CABG 02/21/2013  . Resting chest pain 10/27/2012  . Hypothyroidism 10/15/2012  . Atrial fibrillation ([place]) 10/15/2012  . CKD (chronic kidney disease), stage III 10/15/2012  . UTI (urinary tract infection) 10/15/2012  . Dilated cardiomyopathy ([place])   . Aortic insufficiency   . PAC (premature atrial contraction)   . Chest pain 04/02/2010    Past Surgical History:  Procedure Laterality Date  . biopsy of sacral lesion  2010  . CARDIAC CATHETERIZATION    . COLONOSCOPY N/A 02/23/2013   Procedure: COLONOSCOPY;  Surgeon: Ladene Artist, MD;  Location: [facility] ENDOSCOPY;  Service: Endoscopy;  Laterality: N/A;  . CORONARY ANGIOPLASTY WITH STENT PLACEMENT  09/2012   BMS-95% prox RCA  . ESOPHAGOGASTRODUODENOSCOPY N/A 02/22/2013   Procedure: ESOPHAGOGASTRODUODENOSCOPY (EGD);  Surgeon: Milus Banister, MD;  Location: [facility];  Service: Endoscopy;  Laterality: N/A;  . LEFT HEART CATHETERIZATION WITH CORONARY ANGIOGRAM N/A 10/14/2012  Procedure: LEFT HEART CATHETERIZATION WITH CORONARY ANGIOGRAM;  Surgeon: Wellington Hampshire, MD;  Location: [facility] CATH LAB;  Service: Cardiovascular;  Laterality: N/A;  . PERCUTANEOUS CORONARY STENT INTERVENTION (PCI-S) Right 10/14/2012   Procedure: PERCUTANEOUS CORONARY STENT INTERVENTION (PCI-S);  Surgeon: Wellington Hampshire, MD;  Location: [facility] CATH LAB;  Service: Cardiovascular;  Laterality: Right;  . THYROIDECTOMY  2008  . VAGINAL HYSTERECTOMY     Complete     OB History   No obstetric history on file.     Family  History  Problem Relation Age of Onset  . Diabetes Sister     Social History   Tobacco Use  . Smoking status: Never Smoker  . Smokeless tobacco: Never Used  Substance Use Topics  . Alcohol use: No  . Drug use: No    Home Medications Prior to Admission medications   Medication Sig Start Date End Date Taking? Authorizing Provider  aspirin EC 81 MG tablet Take 81 mg by mouth daily.   Yes [provider]  atorvastatin (LIPITOR) 40 MG tablet Take 1 tablet (40 mg total) by mouth daily at 6 PM. 01/02/19  Yes Martinique, Peter M, MD  Cyanocobalamin (VITAMIN B-12 PO) Take 1 tablet by mouth daily.   Yes [provider]  furosemide (LASIX) 40 MG tablet Take 1 tablet (40 mg total) by mouth 2 (two) times daily. 07/15/19  Yes Martinique, Peter M, MD  iron polysaccharides (FERREX 150) 150 MG capsule Take 150 mg by mouth daily.    Yes [provider]  lactose free nutrition (BOOST) LIQD Take 237 mLs by mouth 3 (three) times daily between meals. Chocolate flavor   Yes [provider]  levothyroxine (SYNTHROID, LEVOTHROID) 25 MCG tablet Take 25 mcg by mouth daily before breakfast.   Yes [provider]  metoprolol tartrate (LOPRESSOR) 25 MG tablet Take 1 tablet (25 mg total) by mouth 2 (two) times daily. 06/01/19  Yes Martinique, Peter M, MD  nitroGLYCERIN (NITROSTAT) 0.4 MG SL tablet Place 1 tablet (0.4 mg total) under the tongue every 5 (five) minutes as needed for chest pain. 04/07/18  Yes Martinique, Peter M, MD  pantoprazole (PROTONIX) 40 MG tablet Take 1 tablet (40 mg total) by mouth daily at 6 (six) AM. 03/09/17  Yes Regalado, Cassie Freer, MD    Allergies    Patient has no known allergies.  Review of Systems   Review of Systems  Constitutional: Negative for fever.  Skin: Positive for wound.    Physical Exam Updated Vital Signs BP 114/74 (BP Location: Left Arm)   Pulse 73   Temp 98 F (36.7 C) (Oral)   Resp 14   Ht 5\' 3"  (1.6 m)   Wt 56.2 kg   SpO2 98%   BMI  21.97 kg/m   Physical Exam Constitutional:      Appearance: She is well-developed.  HENT:     Head: Normocephalic and atraumatic.  Eyes:     Conjunctiva/sclera: Conjunctivae normal.  Musculoskeletal:        General: Tenderness and signs of injury present.     Cervical back: Neck supple.     Right lower leg: Edema present.     Left lower leg: Edema present.     Comments: Is approximately 6 cm V-shaped skin tear laceration to her left lower leg.  There is no active bleeding.  No foreign body noted.  The wound was cleaned with some saline and Steri-Stripped into reasonable approximation.  Skin:    General:  Skin is warm and dry.  Neurological:     General: No focal deficit present.     Mental Status: She is alert.     GCS: GCS eye subscore is 4. GCS verbal subscore is 5. GCS motor subscore is 6.     ED Results / Procedures / Treatments   Labs (all labs ordered are listed, but only abnormal results are displayed) Labs Reviewed - No data to display  EKG None  Radiology No results found.  Procedures .Marland KitchenLaceration Repair  Date/Time: 09/07/2019 10:45 AM Performed by: Hayden Rasmussen, MD Authorized by: Hayden Rasmussen, MD   Consent:    Consent obtained:  Verbal   Consent given by:  Patient   Risks discussed:  Infection, pain, poor cosmetic result, poor wound healing and retained foreign body   Alternatives discussed:  No treatment and delayed treatment Anesthesia (see MAR for exact dosages):    Anesthesia method:  None Laceration details:    Location:  Leg   Leg location:  L lower leg   Length (cm):  6 Repair type:    Repair type:  Simple Treatment:    Area cleansed with:  Saline Skin repair:    Repair method:  Steri-Strips   Number of Steri-Strips:  3 Approximation:    Approximation:  Loose Post-procedure details:    Dressing:  Sterile dressing   Patient tolerance of procedure:  Tolerated well, no immediate complications   (including critical care  time)  Medications Ordered in ED Medications - No data to display  ED Course  I have reviewed the triage vital signs and the nursing notes.  Pertinent labs & imaging results that were available during my care of the patient were reviewed by me and considered in my medical decision making (see chart for details).    MDM Rules/Calculators/A&P                      Final Clinical Impression(s) / ED Diagnoses Final diagnoses:  Noninfected skin tear of left lower extremity, initial encounter    Rx / DC Orders ED Discharge Orders    None       Hayden Rasmussen, MD 09/07/19 1046

## 2019-09-06 NOTE — ED Notes (Signed)
Pt verbalizes understanding of DC instructions. Pt belongings returned and is ambulatory out of ED.  

## 2019-09-06 NOTE — ED Triage Notes (Signed)
Pt reports that she scared her L shin on the car door. Presents with a skin tear that is dressed with bleeding controlled. She is unsure of her last tetanus, but states that it was within 5 years. States she takes a blood thinner, but unsure which one. Aspirin is the only thing seen in her PTA med history,

## 2019-11-28 ENCOUNTER — Other Ambulatory Visit: Payer: Self-pay | Admitting: Cardiology

## 2019-12-16 ENCOUNTER — Other Ambulatory Visit: Payer: Self-pay | Admitting: Cardiology

## 2020-01-13 DIAGNOSIS — E038 Other specified hypothyroidism: Secondary | ICD-10-CM | POA: Diagnosis not present

## 2020-01-13 DIAGNOSIS — E785 Hyperlipidemia, unspecified: Secondary | ICD-10-CM | POA: Diagnosis not present

## 2020-01-13 DIAGNOSIS — E559 Vitamin D deficiency, unspecified: Secondary | ICD-10-CM | POA: Diagnosis not present

## 2020-01-20 DIAGNOSIS — E559 Vitamin D deficiency, unspecified: Secondary | ICD-10-CM | POA: Diagnosis not present

## 2020-01-20 DIAGNOSIS — R82998 Other abnormal findings in urine: Secondary | ICD-10-CM | POA: Diagnosis not present

## 2020-01-20 DIAGNOSIS — D649 Anemia, unspecified: Secondary | ICD-10-CM | POA: Diagnosis not present

## 2020-01-20 DIAGNOSIS — I130 Hypertensive heart and chronic kidney disease with heart failure and stage 1 through stage 4 chronic kidney disease, or unspecified chronic kidney disease: Secondary | ICD-10-CM | POA: Diagnosis not present

## 2020-01-20 DIAGNOSIS — Z0000 Encounter for general adult medical examination without abnormal findings: Secondary | ICD-10-CM | POA: Diagnosis not present

## 2020-01-20 DIAGNOSIS — E785 Hyperlipidemia, unspecified: Secondary | ICD-10-CM | POA: Diagnosis not present

## 2020-01-20 DIAGNOSIS — I10 Essential (primary) hypertension: Secondary | ICD-10-CM | POA: Diagnosis not present

## 2020-01-20 DIAGNOSIS — N1832 Chronic kidney disease, stage 3b: Secondary | ICD-10-CM | POA: Diagnosis not present

## 2020-01-20 DIAGNOSIS — E038 Other specified hypothyroidism: Secondary | ICD-10-CM | POA: Diagnosis not present

## 2020-01-20 DIAGNOSIS — Z23 Encounter for immunization: Secondary | ICD-10-CM | POA: Diagnosis not present

## 2020-01-20 DIAGNOSIS — I5022 Chronic systolic (congestive) heart failure: Secondary | ICD-10-CM | POA: Diagnosis not present

## 2020-01-22 DIAGNOSIS — Z1212 Encounter for screening for malignant neoplasm of rectum: Secondary | ICD-10-CM | POA: Diagnosis not present

## 2020-02-09 ENCOUNTER — Ambulatory Visit (INDEPENDENT_AMBULATORY_CARE_PROVIDER_SITE_OTHER): Payer: Medicare Other | Admitting: Dermatology

## 2020-02-09 ENCOUNTER — Encounter: Payer: Self-pay | Admitting: Dermatology

## 2020-02-09 ENCOUNTER — Other Ambulatory Visit: Payer: Self-pay

## 2020-02-09 DIAGNOSIS — L570 Actinic keratosis: Secondary | ICD-10-CM

## 2020-02-09 DIAGNOSIS — I252 Old myocardial infarction: Secondary | ICD-10-CM

## 2020-02-09 DIAGNOSIS — L820 Inflamed seborrheic keratosis: Secondary | ICD-10-CM | POA: Diagnosis not present

## 2020-02-09 DIAGNOSIS — Z1283 Encounter for screening for malignant neoplasm of skin: Secondary | ICD-10-CM

## 2020-02-09 DIAGNOSIS — L821 Other seborrheic keratosis: Secondary | ICD-10-CM | POA: Diagnosis not present

## 2020-02-09 DIAGNOSIS — I2510 Atherosclerotic heart disease of native coronary artery without angina pectoris: Secondary | ICD-10-CM | POA: Diagnosis not present

## 2020-02-23 ENCOUNTER — Encounter: Payer: Self-pay | Admitting: Dermatology

## 2020-02-23 NOTE — Progress Notes (Addendum)
   Follow-Up Visit   Subjective  JULIUS BONIFACE is a 86 y.o. female who presents for the following: Skin Problem (wants LN2 on dark spots on face). Growths Location: Face Duration:  Quality:  Associated Signs/Symptoms: Modifying Factors:  Severity:  Timing: Context: Patient requests dark spots be treated  Objective  Well appearing patient in no apparent distress; mood and affect are within normal limits.  A focused examination was performed including Head and neck.. Relevant physical exam findings are noted in the Assessment and Plan.   Assessment & Plan    Encounter for screening for malignant neoplasm of skin Right Forehead  Annual skin examination.  Inflamed seborrheic keratosis (4) Mid Forehead; Right Temple; Right Zygomatic Area; Left Malar Cheek  Reminded before any procedure that even though these sometimes get inflamed, these are entirely benign and the appearance undoubtedly bothers Mrs. Oaks more than anyone else. She did want to proceed to try freezing.  Destruction of lesion - Left Malar Cheek, Mid Forehead, Right Temple, Right Zygomatic Area Complexity: simple   Destruction method: cryotherapy   Informed consent: discussed and consent obtained   Timeout:  patient name, date of birth, surgical site, and procedure verified Lesion destroyed using liquid nitrogen: Yes   Cryotherapy cycles:  5 Outcome: patient tolerated procedure well with no complications       I, Lavonna Monarch, MD, have reviewed all documentation for this visit.  The documentation on 03/06/20 for the exam, diagnosis, procedures, and orders are all accurate and complete.

## 2020-02-26 NOTE — Progress Notes (Signed)
Cardiology Office Note    Date:  03/02/2020   ID:  Julie Lara, DOB 03/06/1921, MRN [id number]  PCP:  Burnard Bunting, MD  Cardiologist:  Dr. Martinique  Chief Complaint  Patient presents with  . Congestive Heart Failure    History of Present Illness:  Julie Lara is a 86 y.o. female with PMH of CAD (s/p BMS to RCA in 2014), PAF (not on systemic anticoagulation 2/2 GIB), moderate AI, hypothyroidism, and stage III CKD. She was admitted in April 2018 for evaluation chest pain. Heart rate was 100 at that time. Her Lopressor was increased to 25 mg twice a day. Serial troponin was negative. No further ischemic workup was pursued. She was readmitted  in November 2018 with CHF exacerbation. She was diuresed with IV Lasix. Troponin minimally elevated however had a flat trend. BNP was 940. She was in atrial fibrillation with RVR with heart rate of 120. Echocardiogram obtained on 02/02/2017 showed reduction of EF down to 35-40%, diffuse hypokinesis, moderate LVH, dilated ascending aorta at 43 mm, moderate AI, moderate MR, severely dilated left and right atrium, peak PA pressure 33 mmHg.   She was admitted in December 2018 with acute CHF. Diuresed with IV lasix. DC home on 40 mg daily. BNP elevated to 1583. CXR showed pulmonary edema. DC weight 127 lbs. Barium esophagram showed a stricture at the GE junction but she was asymptomatic from this.  When seen in April 2021 she noted increase swelling in her legs and feet. Gets SOB if she moves around much. On our scale weight is up 3 lbs. We increased her diuretic and on follow up one month later she was clinically  Better and BNP improved.  Continued on lasix 40 mg bid.   On follow up today she is doing OK. No significant SOB. Still has some swelling in feet R>L. By our scales weight is up 2 lbs. She is trying to watch her salt intake but admits she ate too much on Thanksgiving.    Past Medical History:  Diagnosis Date  . Acute blood loss anemia      November 2014  . Aortic insufficiency    mild to moderate  . Arthritis    "right upper arm" (02/01/2017)  . Atrial fibrillation ([place])   . CAD (coronary artery disease)   . Chest pain 04-2010   atypical with negative echocardiogram  . Dilated cardiomyopathy ([place])   . Goiter   . Hypothyroidism   . Idiopathic cardiomyopathy ([place]) 1999   EF 40% by Echo on 10/13/12  . NSTEMI (non-ST elevated myocardial infarction) [facility]) July 2014   95% prox RCA s/p BMS  . PAC (premature atrial contraction)    Chronic    Past Surgical History:  Procedure Laterality Date  . biopsy of sacral lesion  2010  . CARDIAC CATHETERIZATION    . COLONOSCOPY N/A 02/23/2013   Procedure: COLONOSCOPY;  Surgeon: Ladene Artist, MD;  Location[facility] ENDOSCOPY;  Service: Endoscopy;  Laterality: N/A;  . CORONARY ANGIOPLASTY WITH STENT PLACEMENT  09/2012   BMS-95% prox RCA  . ESOPHAGOGASTRODUODENOSCOPY N/A 02/22/2013   Procedure: ESOPHAGOGASTRODUODENOSCOPY (EGD);  Surgeon: Milus Banister, MD;  Location: [place];  Service: Endoscopy;  Laterality: N/A;  . LEFT HEART CATHETERIZATION WITH CORONARY ANGIOGRAM N/A 10/14/2012   Procedure: LEFT HEART CATHETERIZATION WITH CORONARY ANGIOGRAM;  Surgeon: Wellington Hampshire, MD;  Location: [place] CATH LAB;  Service: Cardiovascular;  Laterality: N/A;  . PERCUTANEOUS CORONARY STENT INTERVENTION (PCI-S) Right 10/14/2012  Procedure: PERCUTANEOUS CORONARY STENT INTERVENTION (PCI-S);  Surgeon: Wellington Hampshire, MD;  Location: [facility] C[facility] LAB;  Service: Cardiovascular;  Laterality: Right;  . THYROIDECTOMY  2008  . VAGINAL HYSTERECTOMY     Complete    Current Medications: Outpatient Medications Prior to Visit  Medication Sig Dispense Refill  . aspirin EC 81 MG tablet Take 81 mg by mouth daily.    Marland Kitchen atorvastatin (LIPITOR) 40 MG tablet TAKE 1 TABLET (40 MG TOTAL) BY MOUTH DAILY AT 6 PM. 90 tablet 3  . Cyanocobalamin (VITAMIN B-12 PO) Take 1 tablet by mouth daily.    . furosemide (LASIX) 40 MG  tablet Take 1 tablet (40 mg total) by mouth 2 (two) times daily. 180 tablet 3  . iron polysaccharides (FERREX 150) 150 MG capsule Take 150 mg by mouth daily.     Marland Kitchen lactose free nutrition (BOOST) LIQD Take 237 mLs by mouth 3 (three) times daily between meals. Chocolate flavor    . levothyroxine (SYNTHROID, LEVOTHROID) 25 MCG tablet Take 25 mcg by mouth daily before breakfast.    . metoprolol tartrate (LOPRESSOR) 25 MG tablet TAKE 1 TABLET BY MOUTH TWICE A DAY 180 tablet 2  . nitroGLYCERIN (NITROSTAT) 0.4 MG SL tablet Place 1 tablet (0.4 mg total) under the tongue every 5 (five) minutes as needed for chest pain. 25 tablet 3  . pantoprazole (PROTONIX) 40 MG tablet Take 1 tablet (40 mg total) by mouth daily at 6 (six) AM. 30 tablet 0   No facility-administered medications prior to visit.     Allergies:   Patient has no known allergies.   Social History   Socioeconomic History  . Marital status: Widowed    Spouse name: Not on file  . Number of children: Not on file  . Years of education: Not on file  . Highest education level: Not on file  Occupational History  . Occupation: Retired    Fish farm manager: [employer]  Tobacco Use  . Smoking status: Never Smoker  . Smokeless tobacco: Never Used  Vaping Use  . Vaping Use: Never used  Substance and Sexual Activity  . Alcohol use: No  . Drug use: No  . Sexual activity: Not Currently  Other Topics Concern  . Not on file  Social History Narrative   Pt lives alone, but has family close by.   Social Determinants of Health   Financial Resource Strain:   . Difficulty of Paying Living Expenses: Not on file  Food Insecurity:   . Worried About Charity fundraiser in the Last Year: Not on file  . Ran Out of Food in the Last Year: Not on file  Transportation Needs:   . Lack of Transportation (Medical): Not on file  . Lack of Transportation (Non-Medical): Not on file  Physical Activity:   . Days of Exercise per Week: Not on file  . Minutes  of Exercise per Session: Not on file  Stress:   . Feeling of Stress : Not on file  Social Connections:   . Frequency of Communication with Friends and Family: Not on file  . Frequency of Social Gatherings with Friends and Family: Not on file  . Attends Religious Services: Not on file  . Active Member of Clubs or Organizations: Not on file  . Attends Archivist Meetings: Not on file  . Marital Status: Not on file     Family History:  The patient's family history includes Diabetes in her sister.   ROS:  Please see the history of present illness.    ROS All other systems reviewed and are negative.   PHYSICAL EXAM:   VS:  BP 124/70 (BP Location: Left Arm, Patient Position: Sitting)   Pulse 75   Ht 5\' 4"  (1.626 m)   Wt 129 lb 12.8 oz (58.9 kg)   SpO2 96%   BMI 22.28 kg/m    GENERAL:  Well appearing, elderly WF in NAD HEENT:  PERRL, EOMI, sclera are clear. Oropharynx is clear. NECK:  No jugular venous distention, carotid upstroke brisk and symmetric, no bruits, no thyromegaly or adenopathy LUNGS:  Clear to auscultation bilaterally CHEST:  Unremarkable HEART:  IRRR,  PMI not displaced or sustained,S1 and S2 within normal limits, no S3, no S4: no clicks, no rubs, no murmurs ABD:  Soft, nontender. BS +, no masses or bruits. No hepatomegaly, no splenomegaly EXT:  2 + pulses throughout, 1+ ankle edema, no cyanosis no clubbing SKIN:  Warm and dry.  No rashes NEURO:  Alert and oriented x 3. Cranial nerves II through XII intact. PSYCH:  Cognitively intact   Wt Readings from Last 3 Encounters:  03/02/20 129 lb 12.8 oz (58.9 kg)  09/06/19 124 lb (56.2 kg)  08/10/19 127 lb 6.4 oz (57.8 kg)      Studies/Labs Reviewed:   EKG:  EKG is not ordered today.   Recent Labs: 08/04/2019: BNP 423.3; BUN 29; Creatinine, Ser 1.42; Potassium 4.2; Sodium 143; TSH 6.220   Lipid Panel    Component Value Date/Time   CHOL 146 12/15/2012 1552   TRIG 54.0 12/15/2012 1552   HDL 64.10  12/15/2012 1552   CHOLHDL 2 12/15/2012 1552   VLDL 10.8 12/15/2012 1552   LDLCALC 71 12/15/2012 1552   Labs dated 12/19/16: cholesterol 150, triglycerides 77, HDL 64, LDL 71.  Dated 12/25/17: cholesterol 136, triglycerides 67, HDL 55, LDL 68. Hgb 11.4. creatinine 1.4. Other chemistries and TSH normal. Dated 01/06/19: cholesterol 148, triglycerides 66,HDL 67, LDL 68. Hgb 11.3. creatinine 1.6. TSH 7.82. otherwise CBC and CMET normal   Additional studies/ records that were reviewed today include:   Echo 02/02/2017 LV EF: 35% -   40%  Study Conclusions  - Left ventricle: The cavity size was normal. Wall thickness was   increased in a pattern of moderate LVH. Systolic function was   moderately reduced. The estimated ejection fraction was in the   range of 35% to 40%. Diffuse hypokinesis. The study is not   technically sufficient to allow evaluation of LV diastolic   function. - Aortic valve: Trileaflet; mildly calcified leaflets. There was   moderate regurgitation. - Aorta: Ascending aortic diameter: 43 mm (S). - Ascending aorta: The ascending aorta was mildly dilated. - Mitral valve: Mildly thickened leaflets . There was moderate   regurgitation. - Left atrium: Severely dilated. - Right ventricle: The cavity size was mildly dilated. Systolic   function is mildly reduced. - Right atrium: Severely dilated. - Atrial septum: Bows from left to right, suggesting of high LA >   RA filling pressure. - Tricuspid valve: There was mild regurgitation. - Pulmonary arteries: PA peak pressure: 33 mm Hg (S). - Inferior vena cava: The vessel was dilated. The respirophasic   diameter changes were blunted (< 50%), consistent with elevated   central venous pressure.  Impressions:  - Compared to a prior study in 2014, the LVEF is lower at 35-40%   with diffuse hypokinesis, moderate AI and MR, mild TR, severe   biatrial enlargement. Rhythm  appears to be atrial fibrillation.   ASSESSMENT:     1. Chronic systolic congestive heart failure (HCC)   2. Persistent atrial fibrillation ([place])   3. Coronary artery disease involving native coronary artery of native heart without angina pectoris   4. Moderate aortic regurgitation   5. Stage 3a chronic kidney disease ([place])   6. SOB (shortness of breath)      PLAN:  In order of problems listed above:  1. CAD: asymptomatic, continue aspirin, Lipitor, and metoprolol.  2. Persistent atrial fibrillation/ permanent: HR is well controlled on Toprol. She is not a candidate for long term anticoagulation due to history of GI bleed. On ASA.   3. Chronic systolic heart failure: EF 35-40% on echocardiogram. She has mildly increased weight but edema and SOB is stable. . I have recommended continuing  lasix to 40 mg bid. Check BMET and BNP today. Reinforce sodium restriction.  4. Moderate aortic regurgitation: Stable, continue observation.  5. Hypothyroidism: On Synthroid, last TSH borderline elevated.   6. CKD stage III stable- monitor with increase diuretics.     Medication Adjustments/Labs and Tests Ordered: Current medicines are reviewed at length with the patient today.  Concerns regarding medicines are outlined above.  Medication changes, Labs and Tests ordered today are listed in the Patient Instructions below. There are no Patient Instructions on file for this visit.   Signed, Domenica Weightman Martin[place], MD  03/02/2020 4:14 PM    [facility]

## 2020-03-02 ENCOUNTER — Other Ambulatory Visit: Payer: Self-pay

## 2020-03-02 ENCOUNTER — Ambulatory Visit (INDEPENDENT_AMBULATORY_CARE_PROVIDER_SITE_OTHER): Payer: Medicare Other | Admitting: Cardiology

## 2020-03-02 ENCOUNTER — Encounter: Payer: Self-pay | Admitting: Cardiology

## 2020-03-02 VITALS — BP 124/70 | HR 75 | Ht 64.0 in | Wt 129.8 lb

## 2020-03-02 DIAGNOSIS — I4819 Other persistent atrial fibrillation: Secondary | ICD-10-CM | POA: Diagnosis not present

## 2020-03-02 DIAGNOSIS — N1831 Chronic kidney disease, stage 3a: Secondary | ICD-10-CM

## 2020-03-02 DIAGNOSIS — R0602 Shortness of breath: Secondary | ICD-10-CM

## 2020-03-02 DIAGNOSIS — I2510 Atherosclerotic heart disease of native coronary artery without angina pectoris: Secondary | ICD-10-CM

## 2020-03-02 DIAGNOSIS — I5022 Chronic systolic (congestive) heart failure: Secondary | ICD-10-CM | POA: Diagnosis not present

## 2020-03-02 DIAGNOSIS — I252 Old myocardial infarction: Secondary | ICD-10-CM | POA: Diagnosis not present

## 2020-03-02 DIAGNOSIS — I351 Nonrheumatic aortic (valve) insufficiency: Secondary | ICD-10-CM | POA: Diagnosis not present

## 2020-03-03 LAB — BASIC METABOLIC PANEL
BUN/Creatinine Ratio: 21 (ref 12–28)
BUN: 28 mg/dL (ref 10–36)
CO2: 27 mmol/L (ref 20–29)
Calcium: 9.4 mg/dL (ref 8.7–10.3)
Chloride: 102 mmol/L (ref 96–106)
Creatinine, Ser: 1.33 mg/dL — ABNORMAL HIGH (ref 0.57–1.00)
GFR calc Af Amer: 39 mL/min/{1.73_m2} — ABNORMAL LOW (ref >59)
GFR calc non Af Amer: 34 mL/min/{1.73_m2} — ABNORMAL LOW (ref >59)
Glucose: 88 mg/dL (ref 65–99)
Potassium: 5.4 mmol/L — ABNORMAL HIGH (ref 3.5–5.2)
Sodium: 142 mmol/L (ref 134–144)

## 2020-03-03 LAB — BRAIN NATRIURETIC PEPTIDE: BNP: 356.5 pg/mL — ABNORMAL HIGH (ref 0.0–100.0)

## 2020-07-20 DIAGNOSIS — I130 Hypertensive heart and chronic kidney disease with heart failure and stage 1 through stage 4 chronic kidney disease, or unspecified chronic kidney disease: Secondary | ICD-10-CM | POA: Diagnosis not present

## 2020-07-20 DIAGNOSIS — I5022 Chronic systolic (congestive) heart failure: Secondary | ICD-10-CM | POA: Diagnosis not present

## 2020-07-20 DIAGNOSIS — M5136 Other intervertebral disc degeneration, lumbar region: Secondary | ICD-10-CM | POA: Diagnosis not present

## 2020-07-20 DIAGNOSIS — N1832 Chronic kidney disease, stage 3b: Secondary | ICD-10-CM | POA: Diagnosis not present

## 2020-08-28 ENCOUNTER — Other Ambulatory Visit: Payer: Self-pay | Admitting: Cardiology

## 2020-09-29 DIAGNOSIS — I5022 Chronic systolic (congestive) heart failure: Secondary | ICD-10-CM | POA: Diagnosis not present

## 2020-09-29 DIAGNOSIS — N1832 Chronic kidney disease, stage 3b: Secondary | ICD-10-CM | POA: Diagnosis not present

## 2020-09-29 DIAGNOSIS — I130 Hypertensive heart and chronic kidney disease with heart failure and stage 1 through stage 4 chronic kidney disease, or unspecified chronic kidney disease: Secondary | ICD-10-CM | POA: Diagnosis not present

## 2020-09-29 DIAGNOSIS — E785 Hyperlipidemia, unspecified: Secondary | ICD-10-CM | POA: Diagnosis not present

## 2020-10-16 ENCOUNTER — Other Ambulatory Visit: Payer: Self-pay | Admitting: Cardiology

## 2020-12-20 DIAGNOSIS — M5416 Radiculopathy, lumbar region: Secondary | ICD-10-CM | POA: Diagnosis not present

## 2020-12-20 DIAGNOSIS — M25551 Pain in right hip: Secondary | ICD-10-CM | POA: Diagnosis not present

## 2020-12-27 ENCOUNTER — Encounter: Payer: Self-pay | Admitting: Dermatology

## 2020-12-27 ENCOUNTER — Ambulatory Visit (INDEPENDENT_AMBULATORY_CARE_PROVIDER_SITE_OTHER): Payer: Medicare Other | Admitting: Dermatology

## 2020-12-27 ENCOUNTER — Other Ambulatory Visit: Payer: Self-pay

## 2020-12-27 DIAGNOSIS — L821 Other seborrheic keratosis: Secondary | ICD-10-CM

## 2020-12-27 DIAGNOSIS — L570 Actinic keratosis: Secondary | ICD-10-CM

## 2020-12-27 DIAGNOSIS — L820 Inflamed seborrheic keratosis: Secondary | ICD-10-CM | POA: Diagnosis not present

## 2020-12-30 DIAGNOSIS — M545 Low back pain, unspecified: Secondary | ICD-10-CM | POA: Diagnosis not present

## 2020-12-30 DIAGNOSIS — M25551 Pain in right hip: Secondary | ICD-10-CM | POA: Diagnosis not present

## 2021-01-06 DIAGNOSIS — I130 Hypertensive heart and chronic kidney disease with heart failure and stage 1 through stage 4 chronic kidney disease, or unspecified chronic kidney disease: Secondary | ICD-10-CM | POA: Diagnosis not present

## 2021-01-06 DIAGNOSIS — R051 Acute cough: Secondary | ICD-10-CM | POA: Diagnosis not present

## 2021-01-06 DIAGNOSIS — J069 Acute upper respiratory infection, unspecified: Secondary | ICD-10-CM | POA: Diagnosis not present

## 2021-01-06 DIAGNOSIS — J029 Acute pharyngitis, unspecified: Secondary | ICD-10-CM | POA: Diagnosis not present

## 2021-01-06 DIAGNOSIS — Z1152 Encounter for screening for COVID-19: Secondary | ICD-10-CM | POA: Diagnosis not present

## 2021-01-06 DIAGNOSIS — I5022 Chronic systolic (congestive) heart failure: Secondary | ICD-10-CM | POA: Diagnosis not present

## 2021-01-06 DIAGNOSIS — N1832 Chronic kidney disease, stage 3b: Secondary | ICD-10-CM | POA: Diagnosis not present

## 2021-01-06 DIAGNOSIS — R0602 Shortness of breath: Secondary | ICD-10-CM | POA: Diagnosis not present

## 2021-01-13 ENCOUNTER — Encounter: Payer: Self-pay | Admitting: Dermatology

## 2021-01-13 NOTE — Progress Notes (Signed)
   Follow-Up Visit   Subjective  Julie Lara is a 87 y.o. female who presents for the following: Skin Problem (Right temple- wants checked- no itch no bleed).  Several crusts on face Location:  Duration:  Quality:  Associated Signs/Symptoms: Modifying Factors:  Severity:  Timing: Context:   Objective  Well appearing patient in no apparent distress; mood and affect are within normal limits. Left Submandibular Area, Mid Forehead Pink four millimeter hornlike crusts     Right Temple Pink slightly inflamed 5 mm crust; dermoscopy favors I SK  Head - Anterior (Face) Half dozen noninflamed light brown flattopped 4-8 mm papules forehead, temples, torso    A focused examination was performed including head, neck, upper torso. Relevant physical exam findings are noted in the Assessment and Plan.   Assessment & Plan    AK (actinic keratosis) (2) Mid Forehead; Left Submandibular Area  Destruction of lesion - Left Submandibular Area, Mid Forehead Complexity: simple   Destruction method: cryotherapy   Informed consent: discussed and consent obtained   Timeout:  patient name, date of birth, surgical site, and procedure verified Lesion destroyed using liquid nitrogen: Yes   Cryotherapy cycles:  3 Outcome: patient tolerated procedure well with no complications    Seborrheic keratosis, inflamed Right Temple  Destruction of lesion - Right Temple Complexity: simple   Destruction method: cryotherapy   Informed consent: discussed and consent obtained   Timeout:  patient name, date of birth, surgical site, and procedure verified Lesion destroyed using liquid nitrogen: Yes   Cryotherapy cycles:  3 Outcome: patient tolerated procedure well with no complications    Seborrheic keratosis Head - Anterior (Face)  No intervention necessary      I, Lavonna Monarch, MD, have reviewed all documentation for this visit.  The documentation on 01/13/21 for the exam, diagnosis,  procedures, and orders are all accurate and complete.

## 2021-01-16 DIAGNOSIS — E559 Vitamin D deficiency, unspecified: Secondary | ICD-10-CM | POA: Diagnosis not present

## 2021-01-16 DIAGNOSIS — E038 Other specified hypothyroidism: Secondary | ICD-10-CM | POA: Diagnosis not present

## 2021-01-16 DIAGNOSIS — E785 Hyperlipidemia, unspecified: Secondary | ICD-10-CM | POA: Diagnosis not present

## 2021-01-16 DIAGNOSIS — I10 Essential (primary) hypertension: Secondary | ICD-10-CM | POA: Diagnosis not present

## 2021-01-17 DIAGNOSIS — M545 Low back pain, unspecified: Secondary | ICD-10-CM | POA: Diagnosis not present

## 2021-01-17 DIAGNOSIS — M5451 Vertebrogenic low back pain: Secondary | ICD-10-CM | POA: Diagnosis not present

## 2021-01-23 DIAGNOSIS — R82998 Other abnormal findings in urine: Secondary | ICD-10-CM | POA: Diagnosis not present

## 2021-01-23 DIAGNOSIS — E559 Vitamin D deficiency, unspecified: Secondary | ICD-10-CM | POA: Diagnosis not present

## 2021-01-23 DIAGNOSIS — Z1339 Encounter for screening examination for other mental health and behavioral disorders: Secondary | ICD-10-CM | POA: Diagnosis not present

## 2021-01-23 DIAGNOSIS — Z0000 Encounter for general adult medical examination without abnormal findings: Secondary | ICD-10-CM | POA: Diagnosis not present

## 2021-01-23 DIAGNOSIS — N1832 Chronic kidney disease, stage 3b: Secondary | ICD-10-CM | POA: Diagnosis not present

## 2021-01-23 DIAGNOSIS — I130 Hypertensive heart and chronic kidney disease with heart failure and stage 1 through stage 4 chronic kidney disease, or unspecified chronic kidney disease: Secondary | ICD-10-CM | POA: Diagnosis not present

## 2021-01-23 DIAGNOSIS — Z23 Encounter for immunization: Secondary | ICD-10-CM | POA: Diagnosis not present

## 2021-01-23 DIAGNOSIS — E038 Other specified hypothyroidism: Secondary | ICD-10-CM | POA: Diagnosis not present

## 2021-01-23 DIAGNOSIS — I10 Essential (primary) hypertension: Secondary | ICD-10-CM | POA: Diagnosis not present

## 2021-01-23 DIAGNOSIS — Z1331 Encounter for screening for depression: Secondary | ICD-10-CM | POA: Diagnosis not present

## 2021-01-23 DIAGNOSIS — E785 Hyperlipidemia, unspecified: Secondary | ICD-10-CM | POA: Diagnosis not present

## 2021-01-23 DIAGNOSIS — D649 Anemia, unspecified: Secondary | ICD-10-CM | POA: Diagnosis not present

## 2021-01-26 DIAGNOSIS — M545 Low back pain, unspecified: Secondary | ICD-10-CM | POA: Diagnosis not present

## 2021-03-31 DIAGNOSIS — N1832 Chronic kidney disease, stage 3b: Secondary | ICD-10-CM | POA: Diagnosis not present

## 2021-03-31 DIAGNOSIS — I130 Hypertensive heart and chronic kidney disease with heart failure and stage 1 through stage 4 chronic kidney disease, or unspecified chronic kidney disease: Secondary | ICD-10-CM | POA: Diagnosis not present

## 2021-03-31 DIAGNOSIS — I5022 Chronic systolic (congestive) heart failure: Secondary | ICD-10-CM | POA: Diagnosis not present

## 2021-03-31 DIAGNOSIS — E785 Hyperlipidemia, unspecified: Secondary | ICD-10-CM | POA: Diagnosis not present

## 2021-04-18 ENCOUNTER — Ambulatory Visit: Payer: Medicare Other | Admitting: Dermatology

## 2021-05-06 IMAGING — CR DG HIP (WITH OR WITHOUT PELVIS) 2-3V*R*
2 series · 2 of 2 positions shown · non-contrast
Comparison: None.

CLINICAL DATA: Fall 2 weeks ago.  Right hip and upper leg pain.

EXAM:
DG HIP (WITH OR WITHOUT PELVIS) 2-3V RIGHT

[t hip ap right (1 of 2)]
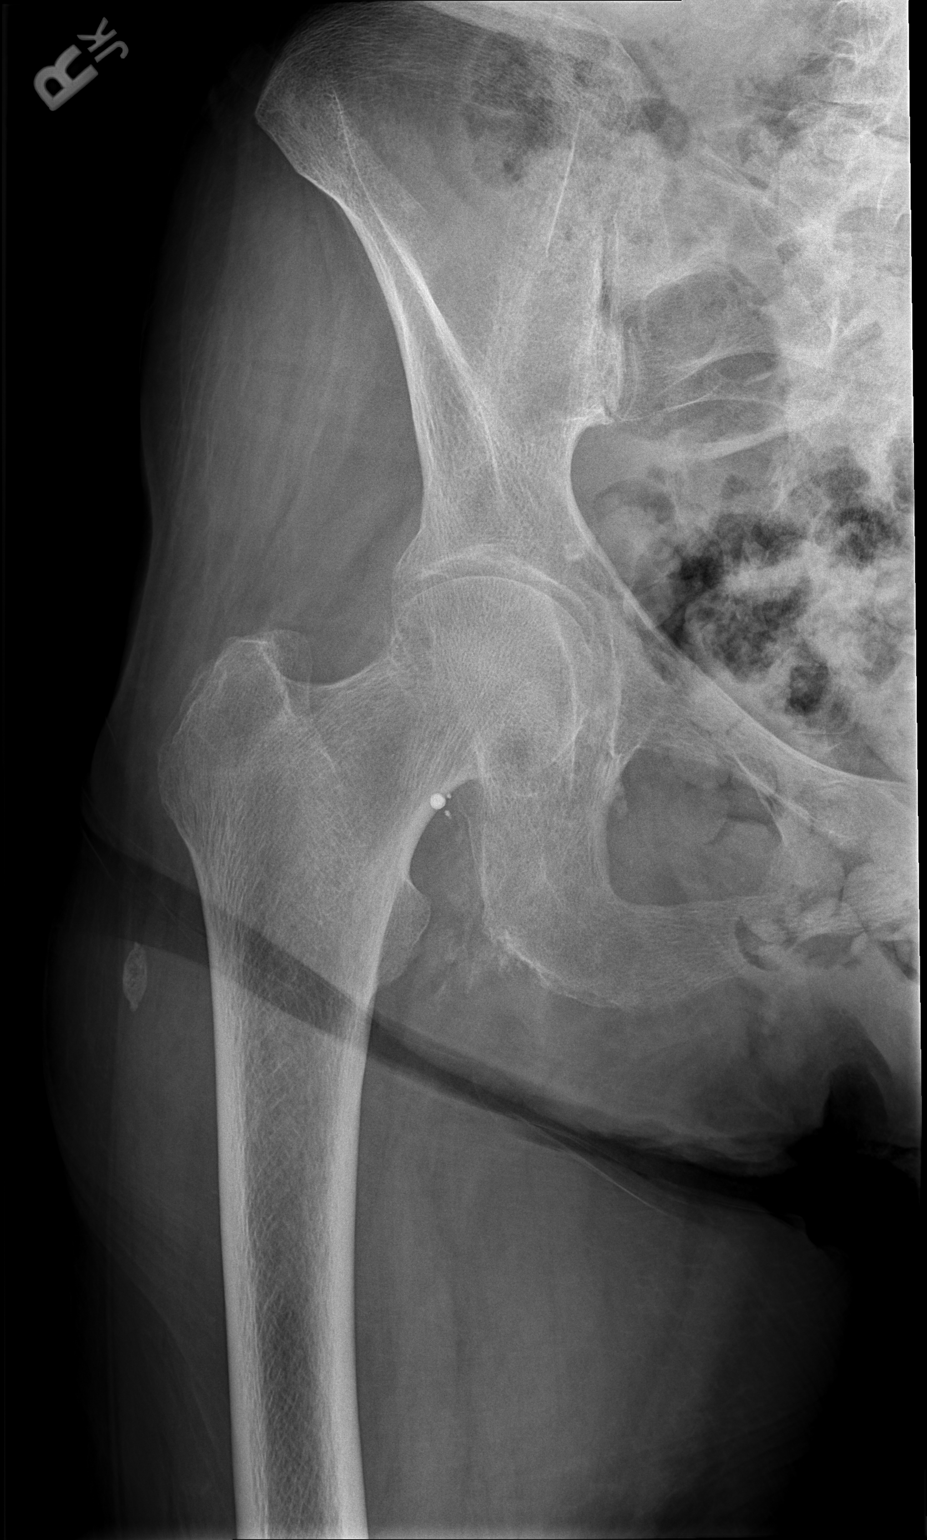

[t hip ap right (2 of 2)]
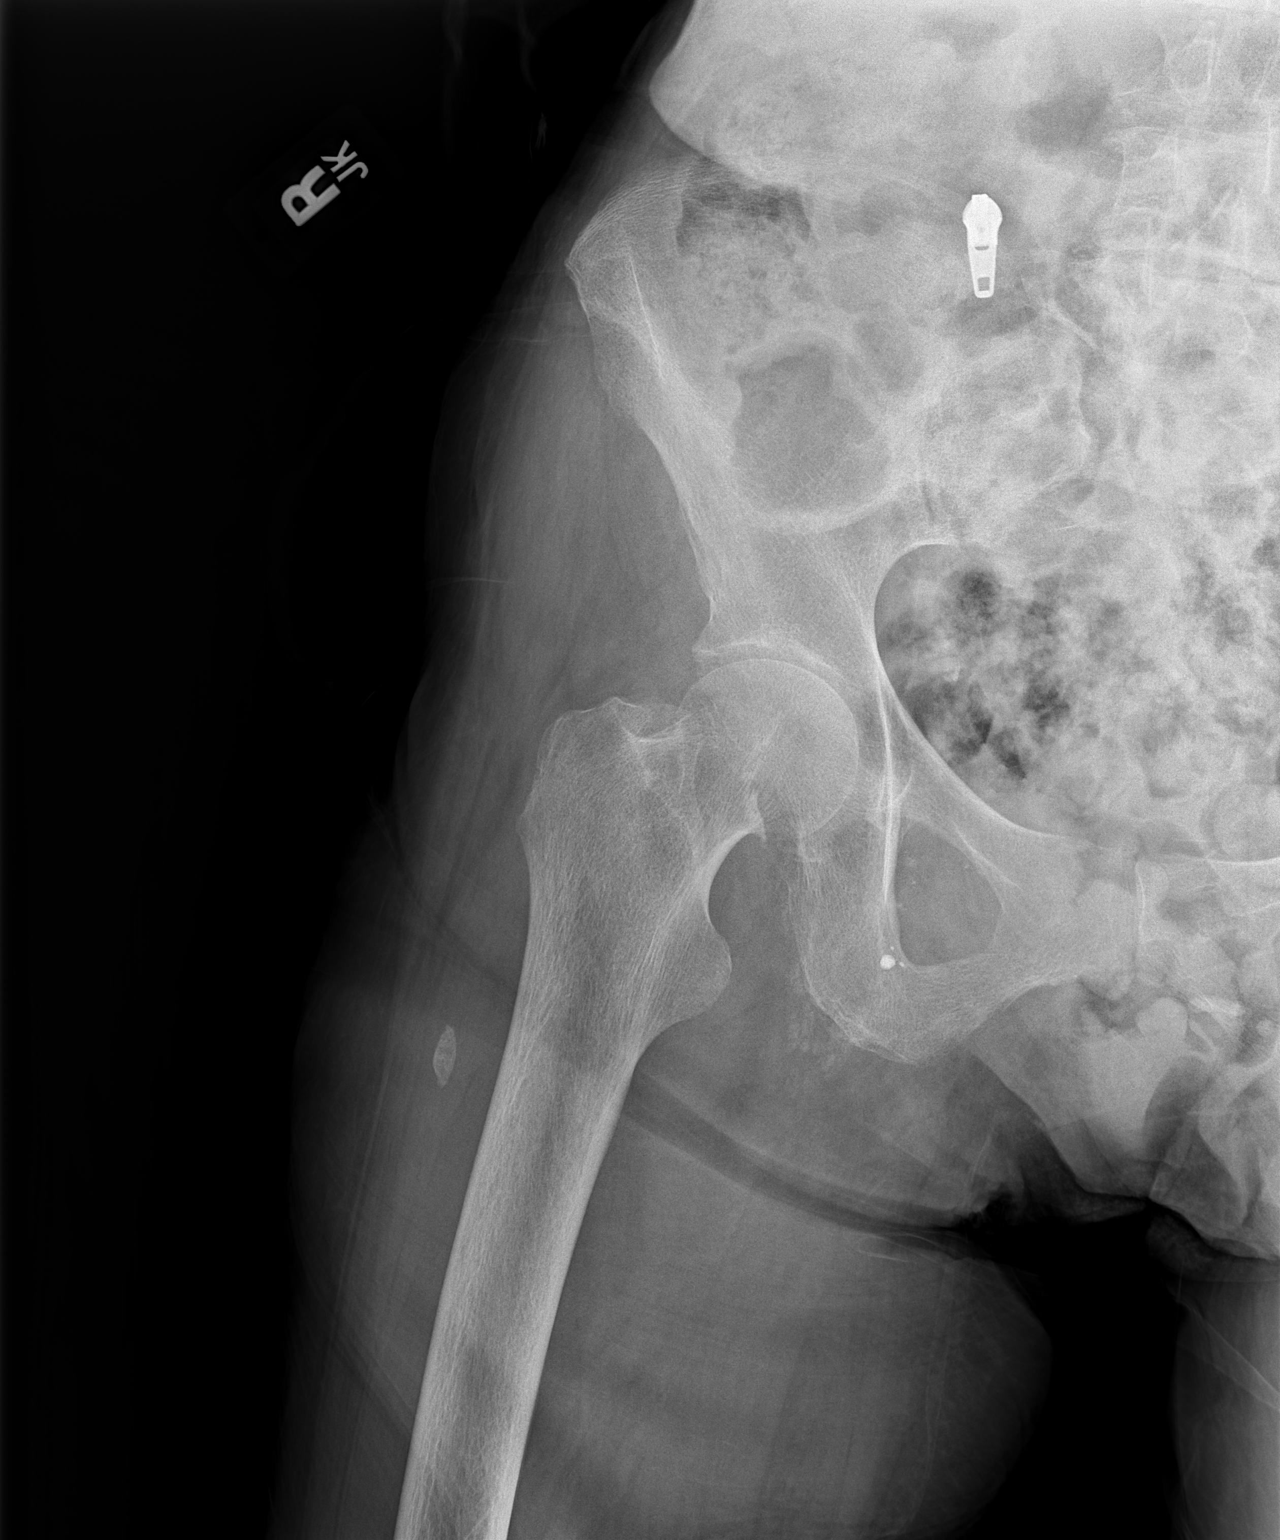

[2 of 2 positions shown; findings below may reference images not displayed]

FINDINGS: No fracture or bone lesion.

Hip joint, right SI joint and symphysis pubis are normally spaced
and aligned.

Soft tissues are unremarkable.
IMPRESSION: Negative.

## 2021-05-06 IMAGING — CR DG KNEE 1-2V*R*
2 series · 2 of 2 positions shown · non-contrast
Comparison: None.

CLINICAL DATA: Fell 2 weeks ago.  Right hip and upper leg pain.

EXAM:
RIGHT KNEE - 1-2 VIEW

[t knee ap right]
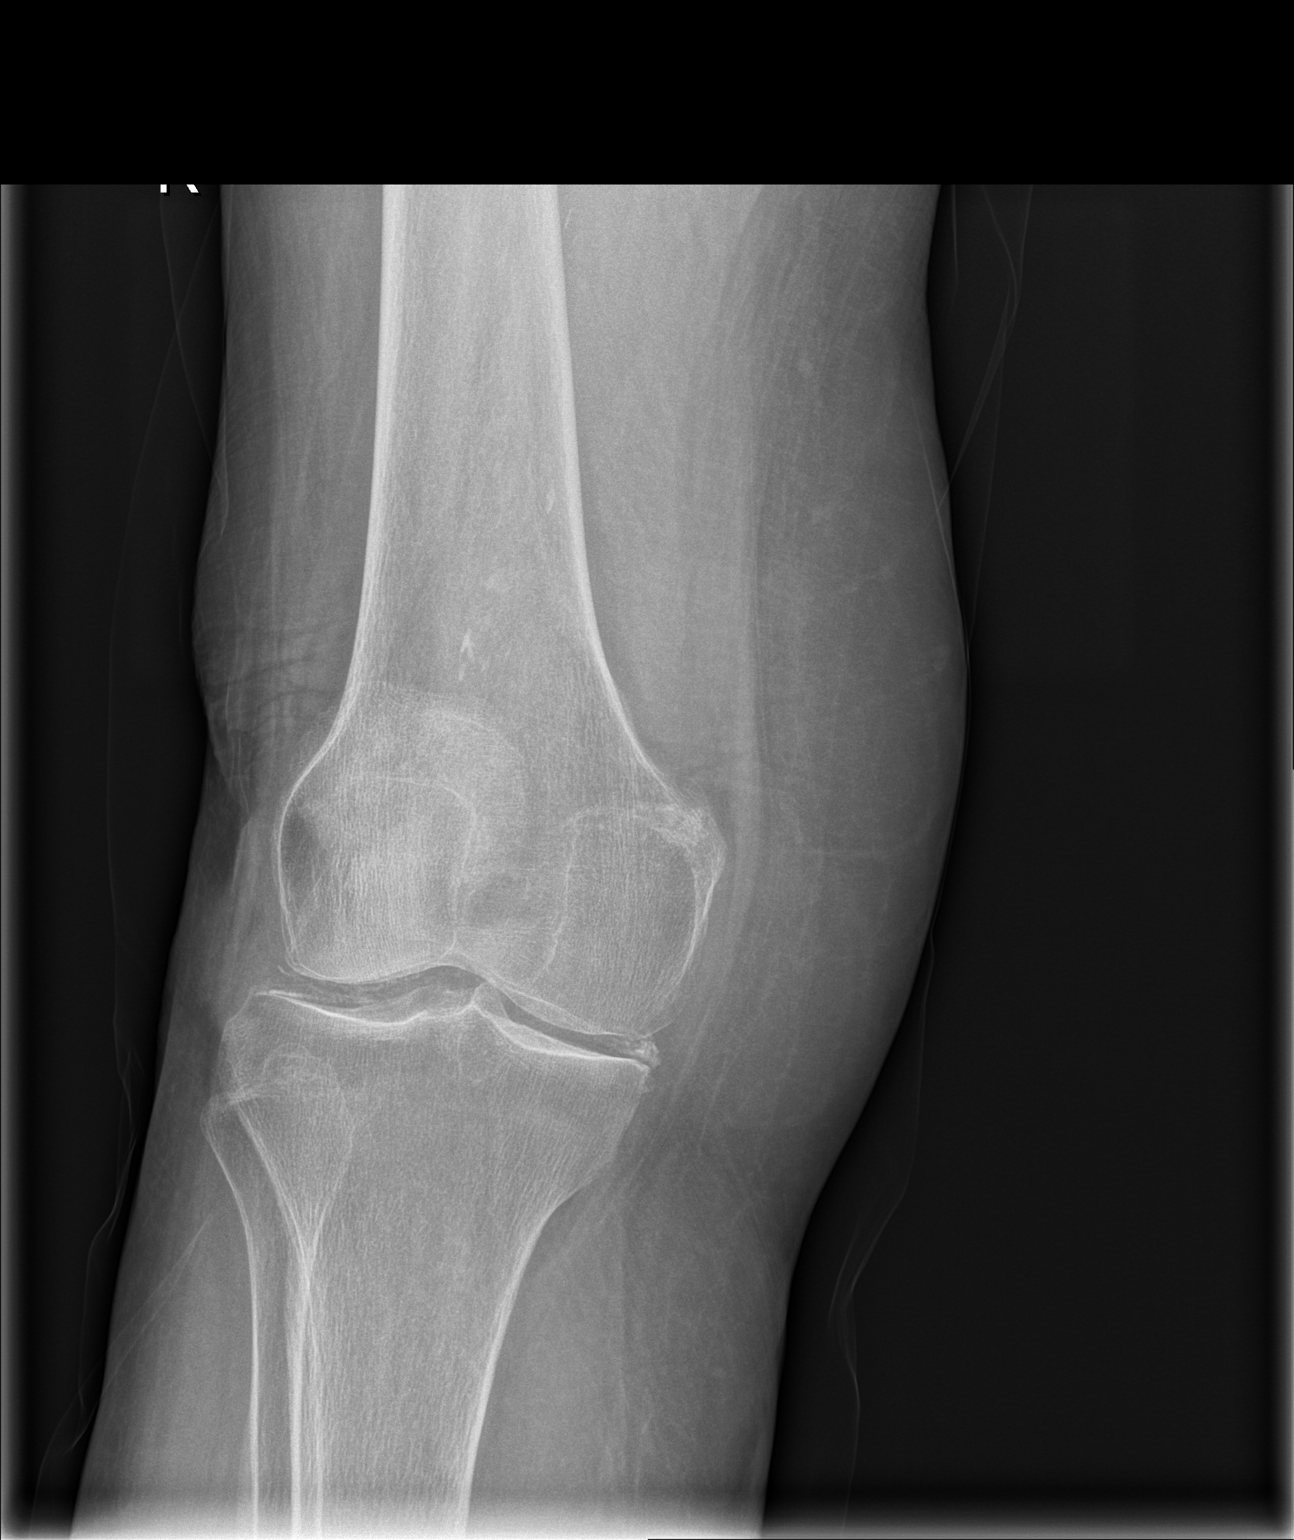

[x knee ap right]
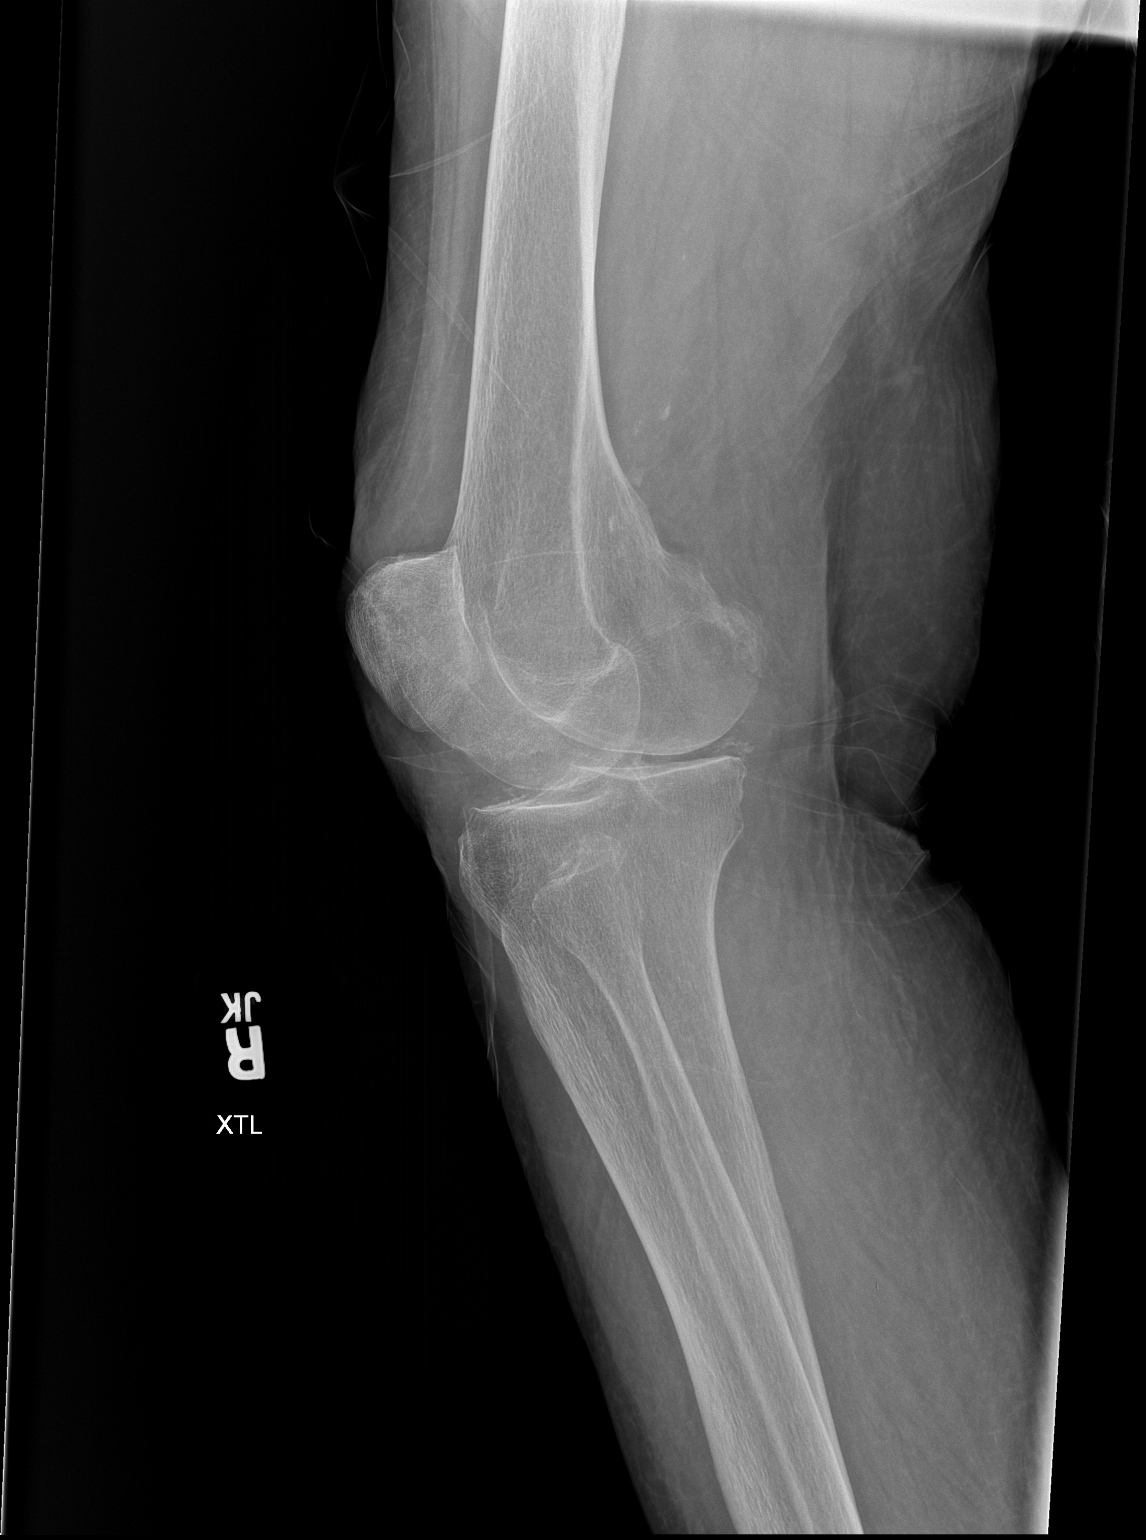

[2 of 2 positions shown; findings below may reference images not displayed]

FINDINGS: No fracture or bone lesion.

Knee joint normally spaced and aligned. Chondrocalcinosis noted
along both menisci. No significant arthropathic change. No
convincing joint effusion. Scattered arterial vascular
calcifications are noted posteriorly. Soft tissues otherwise
unremarkable.
IMPRESSION: 1. No fracture or acute finding.

## 2021-05-30 DIAGNOSIS — M1611 Unilateral primary osteoarthritis, right hip: Secondary | ICD-10-CM | POA: Diagnosis not present

## 2021-05-30 DIAGNOSIS — M25551 Pain in right hip: Secondary | ICD-10-CM | POA: Diagnosis not present

## 2021-06-07 ENCOUNTER — Other Ambulatory Visit: Payer: Self-pay | Admitting: Cardiology

## 2021-06-08 ENCOUNTER — Telehealth: Payer: Self-pay

## 2021-06-08 MED ORDER — METOPROLOL TARTRATE 25 MG PO TABS: 25.0000 mg | ORAL_TABLET | Freq: Two times a day (BID) | ORAL | 0 refills | Status: DC

## 2021-06-08 NOTE — Addendum Note (Signed)
Addended by: Kathyrn Lass on: 06/08/2021 01:24 PM ? ? Modules accepted: Orders ? ?

## 2021-06-08 NOTE — Telephone Encounter (Signed)
Spoke to patient's daughter she stated mother is doing ok except she is having a lot of hip pain and weakness.Follow up appointment scheduled with Dr.Jordan 3/29 at 2:50 pm.Metoprolol refill sent to pharmacy. ?

## 2021-06-08 NOTE — Telephone Encounter (Signed)
Called patient left message on personal voice mail to call back.We received a refill request for your metoprolol,after reviewing your chart last office visit 03/2020.Advised need to schedule follow up visit with Dr.Jordan. ?

## 2021-06-23 NOTE — Progress Notes (Signed)
? ?Cardiology Office Note   ? ?Date:  06/28/2021  ? ?ID:  Julie Lara, DOB [birth year], MRN [id number] ? ?PCP:  Burnard Bunting, MD  ?Cardiologist:  Dr. Martinique ? ?Chief Complaint  ?Patient presents with  ? Congestive Heart Failure  ? Coronary Artery Disease  ? ? ?History of Present Illness:  ?Julie Lara is a 88 y.o. female with PMH of CAD (s/p BMS to RCA in 2014), PAF (not on systemic anticoagulation 2/2 GIB), moderate AI, hypothyroidism, and stage III CKD. She was admitted in April 2018 for evaluation chest pain. Heart rate was 100 at that time. Her Lopressor was increased to 25 mg twice a day. Serial troponin was negative. No further ischemic workup was pursued. She was readmitted  in November 2018 with CHF exacerbation. She was diuresed with IV Lasix. Troponin minimally elevated however had a flat trend. BNP was 940. She was in atrial fibrillation with RVR with heart rate of 120. Echocardiogram obtained on 02/02/2017 showed reduction of EF down to 35-40%, diffuse hypokinesis, moderate LVH, dilated ascending aorta at 43 mm, moderate AI, moderate MR, severely dilated left and right atrium, peak PA pressure 33 mmHg.  ? ?She was admitted in December 2018 with acute CHF. Diuresed with IV lasix. DC home on 40 mg daily. BNP elevated to 1583. CXR showed pulmonary edema. DC weight 127 lbs. Barium esophagram showed a stricture at the GE junction but she was asymptomatic from this. ? ?When seen in April 2021 she noted increase swelling in her legs and feet. Gets SOB if she moves around much. On our scale weight is up 3 lbs. We increased her diuretic and on follow up one month later she was clinically  Better and BNP improved.  Continued on lasix 40 mg bid.  ? ?On follow up today she is doing very well. Just turned 89.  No significant SOB. Swelling is controlled. Weight is down since our last visit. Overall she has no complaints. Is not aware of Afib. ? ? ?Past Medical History:  ?Diagnosis Date  ? Acute blood loss  anemia   ? November 2014  ? Aortic insufficiency   ? mild to moderate  ? Arthritis   ? "right upper arm" (02/01/2017)  ? Atrial fibrillation ([place])   ? CAD (coronary artery disease)   ? Chest pain 04-2010  ? atypical with negative echocardiogram  ? Dilated cardiomyopathy ([place])   ? Goiter   ? Hypothyroidism   ? Idiopathic cardiomyopathy ([place]) 1999  ? EF 40% by Echo on 10/13/12  ? NSTEMI (non-ST elevated myocardial infarction) [facility]) July 2014  ? 95% prox RCA s/p BMS  ? PAC (premature atrial contraction)   ? Chronic  ? ? ?Past Surgical History:  ?Procedure Laterality Date  ? biopsy of sacral lesion  2010  ? CARDIAC CATHETERIZATION    ? COLONOSCOPY N/A 02/23/2013  ? Procedure: COLONOSCOPY;  Surgeon: Ladene Artist, MD;  Location: [facility] ENDOSCOPY;  Service: Endoscopy;  Laterality: N/A;  ? CORONARY ANGIOPLASTY WITH STENT PLACEMENT  09/2012  ? BMS-95% prox RCA  ? ESOPHAGOGASTRODUODENOSCOPY N/A 02/22/2013  ? Procedure: ESOPHAGOGASTRODUODENOSCOPY (EGD);  Surgeon: Milus Banister, MD;  Location: [place];  Service: Endoscopy;  Laterality: N/A;  ? LEFT HEART CATHETERIZATION WITH CORONARY ANGIOGRAM N/A 10/14/2012  ? Procedure: LEFT HEART CATHETERIZATION WITH CORONARY ANGIOGRAM;  Surgeon: Wellington Hampshire, MD;  Location: [place] CATH LAB;  Service: Cardiovascular;  Laterality: N/A;  ? PERCUTANEOUS CORONARY STENT INTERVENTION (PCI-S) Right 10/14/2012  ? Procedure: PERCUTANEOUS  CORONARY STENT INTERVENTION (PCI-S);  Surgeon: Wellington Hampshire, MD;  Location: [facility] CATH LAB;  Service: Cardiovascular;  Laterality: Right;  ? THYROIDECTOMY  2008  ? VAGINAL HYSTERECTOMY    ? Complete  ? ? ?Current Medications: ?Outpatient Medications Prior to Visit  ?Medication Sig Dispense Refill  ? aspirin EC 81 MG tablet Take 81 mg by mouth daily.    ? atorvastatin (LIPITOR) 40 MG tablet TAKE 1 TABLET (40 MG TOTAL) BY MOUTH DAILY AT 6 PM. 90 tablet 3  ? Cyanocobalamin (VITAMIN B-12 PO) Take 1 tablet by mouth daily.    ? furosemide (LASIX) 40 MG tablet TAKE 1  TABLET BY MOUTH TWICE A DAY 180 tablet 3  ? iron polysaccharides (NIFEREX) 150 MG capsule Take 150 mg by mouth daily.     ? lactose free nutrition (BOOST) LIQD Take 237 mLs by mouth 3 (three) times daily between meals. Chocolate flavor    ? levothyroxine (SYNTHROID, LEVOTHROID) 25 MCG tablet Take 25 mcg by mouth daily before breakfast.    ? metoprolol tartrate (LOPRESSOR) 25 MG tablet Take 1 tablet (25 mg total) by mouth 2 (two) times daily. 180 tablet 0  ? nitroGLYCERIN (NITROSTAT) 0.4 MG SL tablet Place 1 tablet (0.4 mg total) under the tongue every 5 (five) minutes as needed for chest pain. 25 tablet 3  ? pantoprazole (PROTONIX) 40 MG tablet Take 1 tablet (40 mg total) by mouth daily at 6 (six) AM. 30 tablet 0  ? ?No facility-administered medications prior to visit.  ?  ? ?Allergies:   Patient has no known allergies.  ? ?Social History  ? ?Socioeconomic History  ? Marital status: Widowed  ?  Spouse name: Not on file  ? Number of children: Not on file  ? Years of education: Not on file  ? Highest education level: Not on file  ?Occupational History  ? Occupation: Retired  ?  Employer: [employer]  ?Tobacco Use  ? Smoking status: Never  ? Smokeless tobacco: Never  ?Vaping Use  ? Vaping Use: Never used  ?Substance and Sexual Activity  ? Alcohol use: No  ? Drug use: No  ? Sexual activity: Not Currently  ?Other Topics Concern  ? Not on file  ?Social History Narrative  ? Pt lives alone, but has family close by.  ? ?Social Determinants of Health  ? ?Financial Resource Strain: Not on file  ?Food Insecurity: Not on file  ?Transportation Needs: Not on file  ?Physical Activity: Not on file  ?Stress: Not on file  ?Social Connections: Not on file  ?  ? ?Family History:  The patient's family history includes Diabetes in her sister.  ? ?ROS:   ?Please see the history of present illness.    ?ROS All other systems reviewed and are negative. ? ? ?PHYSICAL EXAM:   ?VS:  BP 116/70   Pulse 76   Ht '5\' 4"'$  (1.626 m)   Wt 120 lb  9.6 oz (54.7 kg)   SpO2 97%   BMI 20.70 kg/m?    ?GENERAL:  Well appearing, elderly WF in NAD ?HEENT:  PERRL, EOMI, sclera are clear. Oropharynx is clear. ?NECK:  No jugular venous distention, carotid upstroke brisk and symmetric, no bruits, no thyromegaly or adenopathy ?LUNGS:  Clear to auscultation bilaterally ?CHEST:  Unremarkable ?HEART:  IRRR,  PMI not displaced or sustained,S1 and S2 within normal limits, no S3, no S4: no clicks, no rubs, no murmurs ?ABD:  Soft, nontender. BS +, no masses or bruits. No  hepatomegaly, no splenomegaly ?EXT:  2 + pulses throughout, tr  ankle edema, no cyanosis no clubbing ?SKIN:  Warm and dry.  No rashes ?NEURO:  Alert and oriented x 3. Cranial nerves II through XII intact. ?PSYCH:  Cognitively intact ? ? ?Wt Readings from Last 3 Encounters:  ?06/28/21 120 lb 9.6 oz (54.7 kg)  ?03/02/20 129 lb 12.8 oz (58.9 kg)  ?09/06/19 124 lb (56.2 kg)  ?  ? ? ?Studies/Labs Reviewed:  ? ?EKG:  EKG is ordered today. Afib rate 76. RBBB. I have personally reviewed and interpreted this study. ? ? ? ?Recent Labs: ?No results found for requested labs within last 8760 hours.  ? ?Lipid Panel ?   ?Component Value Date/Time  ? CHOL 146 12/15/2012 1552  ? TRIG 54.0 12/15/2012 1552  ? HDL 64.10 12/15/2012 1552  ? CHOLHDL 2 12/15/2012 1552  ? VLDL 10.8 12/15/2012 1552  ? [place] 71 12/15/2012 1552  ? ?Labs dated 12/19/16: cholesterol 150, triglycerides 77, HDL 64, LDL 71.  ?Dated 12/25/17: cholesterol 136, triglycerides 67, HDL 55, LDL 68. Hgb 11.4. creatinine 1.4. Other chemistries and TSH normal. ?Dated 01/06/19: cholesterol 148, triglycerides 66,HDL 67, LDL 68. Hgb 11.3. creatinine 1.6. TSH 7.82. otherwise CBC and CMET normal ?Dated 01/16/21: cholesterol 125, triglycerides 63, HDL 54, LDL 58. LFTs and electrolytes normal ? ?Additional studies/ records that were reviewed today include:  ? ?Echo 02/02/2017 ?LV EF: 35% -   40% ?  ?Study Conclusions ?  ?- Left ventricle: The cavity size was normal. Wall  thickness was ?  increased in a pattern of moderate LVH. Systolic function was ?  moderately reduced. The estimated ejection fraction was in the ?  range of 35% to 40%. Diffuse hypokinesis. The study is not ?  techni

## 2021-06-28 ENCOUNTER — Encounter: Payer: Self-pay | Admitting: Cardiology

## 2021-06-28 ENCOUNTER — Ambulatory Visit (INDEPENDENT_AMBULATORY_CARE_PROVIDER_SITE_OTHER): Payer: Medicare Other | Admitting: Cardiology

## 2021-06-28 VITALS — BP 116/70 | HR 76 | Ht 64.0 in | Wt 120.6 lb

## 2021-06-28 DIAGNOSIS — I5022 Chronic systolic (congestive) heart failure: Secondary | ICD-10-CM | POA: Diagnosis not present

## 2021-06-28 DIAGNOSIS — N1831 Chronic kidney disease, stage 3a: Secondary | ICD-10-CM

## 2021-06-28 DIAGNOSIS — I351 Nonrheumatic aortic (valve) insufficiency: Secondary | ICD-10-CM | POA: Diagnosis not present

## 2021-06-28 DIAGNOSIS — I4819 Other persistent atrial fibrillation: Secondary | ICD-10-CM | POA: Diagnosis not present

## 2021-06-28 DIAGNOSIS — I2510 Atherosclerotic heart disease of native coronary artery without angina pectoris: Secondary | ICD-10-CM

## 2021-06-28 MED ORDER — METOPROLOL TARTRATE 25 MG PO TABS: 25.0000 mg | ORAL_TABLET | Freq: Two times a day (BID) | ORAL | 3 refills | Status: DC

## 2021-06-28 MED ORDER — NITROGLYCERIN 0.4 MG SL SUBL
0.4000 mg | SUBLINGUAL_TABLET | SUBLINGUAL | 3 refills | Status: DC | PRN
Start: 2021-06-28 — End: 2023-07-30

## 2021-08-16 DIAGNOSIS — M25551 Pain in right hip: Secondary | ICD-10-CM | POA: Diagnosis not present

## 2021-08-16 DIAGNOSIS — D649 Anemia, unspecified: Secondary | ICD-10-CM | POA: Diagnosis not present

## 2021-08-16 DIAGNOSIS — R531 Weakness: Secondary | ICD-10-CM | POA: Diagnosis not present

## 2021-08-16 DIAGNOSIS — N1832 Chronic kidney disease, stage 3b: Secondary | ICD-10-CM | POA: Diagnosis not present

## 2021-08-16 DIAGNOSIS — I130 Hypertensive heart and chronic kidney disease with heart failure and stage 1 through stage 4 chronic kidney disease, or unspecified chronic kidney disease: Secondary | ICD-10-CM | POA: Diagnosis not present

## 2021-08-16 DIAGNOSIS — E559 Vitamin D deficiency, unspecified: Secondary | ICD-10-CM | POA: Diagnosis not present

## 2021-08-16 DIAGNOSIS — M1611 Unilateral primary osteoarthritis, right hip: Secondary | ICD-10-CM | POA: Diagnosis not present

## 2021-08-17 ENCOUNTER — Other Ambulatory Visit: Payer: Self-pay | Admitting: Internal Medicine

## 2021-08-17 DIAGNOSIS — R531 Weakness: Secondary | ICD-10-CM

## 2021-09-03 ENCOUNTER — Other Ambulatory Visit: Payer: Self-pay | Admitting: Cardiology

## 2021-09-04 DIAGNOSIS — M1711 Unilateral primary osteoarthritis, right knee: Secondary | ICD-10-CM | POA: Diagnosis not present

## 2021-09-04 DIAGNOSIS — Z7982 Long term (current) use of aspirin: Secondary | ICD-10-CM | POA: Diagnosis not present

## 2021-09-04 DIAGNOSIS — D631 Anemia in chronic kidney disease: Secondary | ICD-10-CM | POA: Diagnosis not present

## 2021-09-04 DIAGNOSIS — M1611 Unilateral primary osteoarthritis, right hip: Secondary | ICD-10-CM | POA: Diagnosis not present

## 2021-09-04 DIAGNOSIS — N1832 Chronic kidney disease, stage 3b: Secondary | ICD-10-CM | POA: Diagnosis not present

## 2021-09-04 DIAGNOSIS — I429 Cardiomyopathy, unspecified: Secondary | ICD-10-CM | POA: Diagnosis not present

## 2021-09-04 DIAGNOSIS — E559 Vitamin D deficiency, unspecified: Secondary | ICD-10-CM | POA: Diagnosis not present

## 2021-09-04 DIAGNOSIS — Z859 Personal history of malignant neoplasm, unspecified: Secondary | ICD-10-CM | POA: Diagnosis not present

## 2021-09-04 DIAGNOSIS — I4891 Unspecified atrial fibrillation: Secondary | ICD-10-CM | POA: Diagnosis not present

## 2021-09-04 DIAGNOSIS — N189 Chronic kidney disease, unspecified: Secondary | ICD-10-CM | POA: Diagnosis not present

## 2021-09-04 DIAGNOSIS — Z556 Problems related to health literacy: Secondary | ICD-10-CM | POA: Diagnosis not present

## 2021-09-04 DIAGNOSIS — I509 Heart failure, unspecified: Secondary | ICD-10-CM | POA: Diagnosis not present

## 2021-09-04 DIAGNOSIS — I130 Hypertensive heart and chronic kidney disease with heart failure and stage 1 through stage 4 chronic kidney disease, or unspecified chronic kidney disease: Secondary | ICD-10-CM | POA: Diagnosis not present

## 2021-09-06 ENCOUNTER — Other Ambulatory Visit: Payer: Self-pay | Admitting: Cardiology

## 2021-09-09 ENCOUNTER — Ambulatory Visit
Admission: RE | Admit: 2021-09-09 | Discharge: 2021-09-09 | Disposition: A | Discharge status: Home / Self Care | Payer: Medicare Other | Source: Ambulatory Visit | Attending: Internal Medicine | Admitting: Internal Medicine

## 2021-09-09 DIAGNOSIS — G319 Degenerative disease of nervous system, unspecified: Secondary | ICD-10-CM | POA: Diagnosis not present

## 2021-09-09 DIAGNOSIS — R531 Weakness: Secondary | ICD-10-CM

## 2021-09-09 MED ORDER — GADOBENATE DIMEGLUMINE 529 MG/ML IV SOLN
11.0000 mL | Freq: Once | INTRAVENOUS | Status: AC | PRN
  Administered 2021-09-09: 11 mL via INTRAVENOUS

## 2021-09-13 DIAGNOSIS — I130 Hypertensive heart and chronic kidney disease with heart failure and stage 1 through stage 4 chronic kidney disease, or unspecified chronic kidney disease: Secondary | ICD-10-CM | POA: Diagnosis not present

## 2021-09-13 DIAGNOSIS — D631 Anemia in chronic kidney disease: Secondary | ICD-10-CM | POA: Diagnosis not present

## 2021-09-13 DIAGNOSIS — M1711 Unilateral primary osteoarthritis, right knee: Secondary | ICD-10-CM | POA: Diagnosis not present

## 2021-09-13 DIAGNOSIS — I509 Heart failure, unspecified: Secondary | ICD-10-CM | POA: Diagnosis not present

## 2021-09-13 DIAGNOSIS — N1832 Chronic kidney disease, stage 3b: Secondary | ICD-10-CM | POA: Diagnosis not present

## 2021-09-13 DIAGNOSIS — M1611 Unilateral primary osteoarthritis, right hip: Secondary | ICD-10-CM | POA: Diagnosis not present

## 2021-09-15 DIAGNOSIS — I130 Hypertensive heart and chronic kidney disease with heart failure and stage 1 through stage 4 chronic kidney disease, or unspecified chronic kidney disease: Secondary | ICD-10-CM | POA: Diagnosis not present

## 2021-09-15 DIAGNOSIS — N1832 Chronic kidney disease, stage 3b: Secondary | ICD-10-CM | POA: Diagnosis not present

## 2021-09-15 DIAGNOSIS — M1611 Unilateral primary osteoarthritis, right hip: Secondary | ICD-10-CM | POA: Diagnosis not present

## 2021-09-15 DIAGNOSIS — D631 Anemia in chronic kidney disease: Secondary | ICD-10-CM | POA: Diagnosis not present

## 2021-09-15 DIAGNOSIS — M1711 Unilateral primary osteoarthritis, right knee: Secondary | ICD-10-CM | POA: Diagnosis not present

## 2021-09-15 DIAGNOSIS — I509 Heart failure, unspecified: Secondary | ICD-10-CM | POA: Diagnosis not present

## 2021-09-18 DIAGNOSIS — N1832 Chronic kidney disease, stage 3b: Secondary | ICD-10-CM | POA: Diagnosis not present

## 2021-09-18 DIAGNOSIS — I509 Heart failure, unspecified: Secondary | ICD-10-CM | POA: Diagnosis not present

## 2021-09-18 DIAGNOSIS — I130 Hypertensive heart and chronic kidney disease with heart failure and stage 1 through stage 4 chronic kidney disease, or unspecified chronic kidney disease: Secondary | ICD-10-CM | POA: Diagnosis not present

## 2021-09-18 DIAGNOSIS — M1711 Unilateral primary osteoarthritis, right knee: Secondary | ICD-10-CM | POA: Diagnosis not present

## 2021-09-18 DIAGNOSIS — M1611 Unilateral primary osteoarthritis, right hip: Secondary | ICD-10-CM | POA: Diagnosis not present

## 2021-09-18 DIAGNOSIS — D631 Anemia in chronic kidney disease: Secondary | ICD-10-CM | POA: Diagnosis not present

## 2021-09-20 DIAGNOSIS — D631 Anemia in chronic kidney disease: Secondary | ICD-10-CM | POA: Diagnosis not present

## 2021-09-20 DIAGNOSIS — M1611 Unilateral primary osteoarthritis, right hip: Secondary | ICD-10-CM | POA: Diagnosis not present

## 2021-09-20 DIAGNOSIS — I509 Heart failure, unspecified: Secondary | ICD-10-CM | POA: Diagnosis not present

## 2021-09-20 DIAGNOSIS — I130 Hypertensive heart and chronic kidney disease with heart failure and stage 1 through stage 4 chronic kidney disease, or unspecified chronic kidney disease: Secondary | ICD-10-CM | POA: Diagnosis not present

## 2021-09-20 DIAGNOSIS — M1711 Unilateral primary osteoarthritis, right knee: Secondary | ICD-10-CM | POA: Diagnosis not present

## 2021-09-20 DIAGNOSIS — N1832 Chronic kidney disease, stage 3b: Secondary | ICD-10-CM | POA: Diagnosis not present

## 2021-09-22 DIAGNOSIS — M1611 Unilateral primary osteoarthritis, right hip: Secondary | ICD-10-CM | POA: Diagnosis not present

## 2021-09-22 DIAGNOSIS — I509 Heart failure, unspecified: Secondary | ICD-10-CM | POA: Diagnosis not present

## 2021-09-22 DIAGNOSIS — D631 Anemia in chronic kidney disease: Secondary | ICD-10-CM | POA: Diagnosis not present

## 2021-09-22 DIAGNOSIS — I130 Hypertensive heart and chronic kidney disease with heart failure and stage 1 through stage 4 chronic kidney disease, or unspecified chronic kidney disease: Secondary | ICD-10-CM | POA: Diagnosis not present

## 2021-09-22 DIAGNOSIS — N1832 Chronic kidney disease, stage 3b: Secondary | ICD-10-CM | POA: Diagnosis not present

## 2021-09-22 DIAGNOSIS — M1711 Unilateral primary osteoarthritis, right knee: Secondary | ICD-10-CM | POA: Diagnosis not present

## 2021-09-25 DIAGNOSIS — I509 Heart failure, unspecified: Secondary | ICD-10-CM | POA: Diagnosis not present

## 2021-09-25 DIAGNOSIS — M1711 Unilateral primary osteoarthritis, right knee: Secondary | ICD-10-CM | POA: Diagnosis not present

## 2021-09-25 DIAGNOSIS — M1611 Unilateral primary osteoarthritis, right hip: Secondary | ICD-10-CM | POA: Diagnosis not present

## 2021-09-25 DIAGNOSIS — N1832 Chronic kidney disease, stage 3b: Secondary | ICD-10-CM | POA: Diagnosis not present

## 2021-09-25 DIAGNOSIS — I130 Hypertensive heart and chronic kidney disease with heart failure and stage 1 through stage 4 chronic kidney disease, or unspecified chronic kidney disease: Secondary | ICD-10-CM | POA: Diagnosis not present

## 2021-09-25 DIAGNOSIS — D631 Anemia in chronic kidney disease: Secondary | ICD-10-CM | POA: Diagnosis not present

## 2021-09-28 DIAGNOSIS — N1832 Chronic kidney disease, stage 3b: Secondary | ICD-10-CM | POA: Diagnosis not present

## 2021-09-28 DIAGNOSIS — M1611 Unilateral primary osteoarthritis, right hip: Secondary | ICD-10-CM | POA: Diagnosis not present

## 2021-09-28 DIAGNOSIS — I130 Hypertensive heart and chronic kidney disease with heart failure and stage 1 through stage 4 chronic kidney disease, or unspecified chronic kidney disease: Secondary | ICD-10-CM | POA: Diagnosis not present

## 2021-09-28 DIAGNOSIS — I509 Heart failure, unspecified: Secondary | ICD-10-CM | POA: Diagnosis not present

## 2021-09-28 DIAGNOSIS — D631 Anemia in chronic kidney disease: Secondary | ICD-10-CM | POA: Diagnosis not present

## 2021-09-28 DIAGNOSIS — M1711 Unilateral primary osteoarthritis, right knee: Secondary | ICD-10-CM | POA: Diagnosis not present

## 2021-10-01 ENCOUNTER — Other Ambulatory Visit: Payer: Self-pay | Admitting: Cardiology

## 2021-10-04 DIAGNOSIS — I429 Cardiomyopathy, unspecified: Secondary | ICD-10-CM | POA: Diagnosis not present

## 2021-10-04 DIAGNOSIS — Z556 Problems related to health literacy: Secondary | ICD-10-CM | POA: Diagnosis not present

## 2021-10-04 DIAGNOSIS — I130 Hypertensive heart and chronic kidney disease with heart failure and stage 1 through stage 4 chronic kidney disease, or unspecified chronic kidney disease: Secondary | ICD-10-CM | POA: Diagnosis not present

## 2021-10-04 DIAGNOSIS — I4891 Unspecified atrial fibrillation: Secondary | ICD-10-CM | POA: Diagnosis not present

## 2021-10-04 DIAGNOSIS — M1711 Unilateral primary osteoarthritis, right knee: Secondary | ICD-10-CM | POA: Diagnosis not present

## 2021-10-04 DIAGNOSIS — E559 Vitamin D deficiency, unspecified: Secondary | ICD-10-CM | POA: Diagnosis not present

## 2021-10-04 DIAGNOSIS — Z7982 Long term (current) use of aspirin: Secondary | ICD-10-CM | POA: Diagnosis not present

## 2021-10-04 DIAGNOSIS — M1611 Unilateral primary osteoarthritis, right hip: Secondary | ICD-10-CM | POA: Diagnosis not present

## 2021-10-04 DIAGNOSIS — N1832 Chronic kidney disease, stage 3b: Secondary | ICD-10-CM | POA: Diagnosis not present

## 2021-10-04 DIAGNOSIS — I509 Heart failure, unspecified: Secondary | ICD-10-CM | POA: Diagnosis not present

## 2021-10-04 DIAGNOSIS — Z859 Personal history of malignant neoplasm, unspecified: Secondary | ICD-10-CM | POA: Diagnosis not present

## 2021-10-04 DIAGNOSIS — D631 Anemia in chronic kidney disease: Secondary | ICD-10-CM | POA: Diagnosis not present

## 2021-10-09 DIAGNOSIS — N1832 Chronic kidney disease, stage 3b: Secondary | ICD-10-CM | POA: Diagnosis not present

## 2021-10-09 DIAGNOSIS — I130 Hypertensive heart and chronic kidney disease with heart failure and stage 1 through stage 4 chronic kidney disease, or unspecified chronic kidney disease: Secondary | ICD-10-CM | POA: Diagnosis not present

## 2021-10-09 DIAGNOSIS — M1611 Unilateral primary osteoarthritis, right hip: Secondary | ICD-10-CM | POA: Diagnosis not present

## 2021-10-09 DIAGNOSIS — M1711 Unilateral primary osteoarthritis, right knee: Secondary | ICD-10-CM | POA: Diagnosis not present

## 2021-10-09 DIAGNOSIS — D631 Anemia in chronic kidney disease: Secondary | ICD-10-CM | POA: Diagnosis not present

## 2021-10-09 DIAGNOSIS — I509 Heart failure, unspecified: Secondary | ICD-10-CM | POA: Diagnosis not present

## 2021-10-17 DIAGNOSIS — M1611 Unilateral primary osteoarthritis, right hip: Secondary | ICD-10-CM | POA: Diagnosis not present

## 2021-10-17 DIAGNOSIS — N1832 Chronic kidney disease, stage 3b: Secondary | ICD-10-CM | POA: Diagnosis not present

## 2021-10-17 DIAGNOSIS — I130 Hypertensive heart and chronic kidney disease with heart failure and stage 1 through stage 4 chronic kidney disease, or unspecified chronic kidney disease: Secondary | ICD-10-CM | POA: Diagnosis not present

## 2021-10-17 DIAGNOSIS — I509 Heart failure, unspecified: Secondary | ICD-10-CM | POA: Diagnosis not present

## 2021-10-17 DIAGNOSIS — M1711 Unilateral primary osteoarthritis, right knee: Secondary | ICD-10-CM | POA: Diagnosis not present

## 2021-10-17 DIAGNOSIS — D631 Anemia in chronic kidney disease: Secondary | ICD-10-CM | POA: Diagnosis not present

## 2021-10-24 DIAGNOSIS — D631 Anemia in chronic kidney disease: Secondary | ICD-10-CM | POA: Diagnosis not present

## 2021-10-24 DIAGNOSIS — M1711 Unilateral primary osteoarthritis, right knee: Secondary | ICD-10-CM | POA: Diagnosis not present

## 2021-10-24 DIAGNOSIS — N1832 Chronic kidney disease, stage 3b: Secondary | ICD-10-CM | POA: Diagnosis not present

## 2021-10-24 DIAGNOSIS — M1611 Unilateral primary osteoarthritis, right hip: Secondary | ICD-10-CM | POA: Diagnosis not present

## 2021-10-24 DIAGNOSIS — I509 Heart failure, unspecified: Secondary | ICD-10-CM | POA: Diagnosis not present

## 2021-10-24 DIAGNOSIS — I130 Hypertensive heart and chronic kidney disease with heart failure and stage 1 through stage 4 chronic kidney disease, or unspecified chronic kidney disease: Secondary | ICD-10-CM | POA: Diagnosis not present

## 2021-11-01 DIAGNOSIS — M1611 Unilateral primary osteoarthritis, right hip: Secondary | ICD-10-CM | POA: Diagnosis not present

## 2021-11-01 DIAGNOSIS — N1832 Chronic kidney disease, stage 3b: Secondary | ICD-10-CM | POA: Diagnosis not present

## 2021-11-01 DIAGNOSIS — D631 Anemia in chronic kidney disease: Secondary | ICD-10-CM | POA: Diagnosis not present

## 2021-11-01 DIAGNOSIS — I130 Hypertensive heart and chronic kidney disease with heart failure and stage 1 through stage 4 chronic kidney disease, or unspecified chronic kidney disease: Secondary | ICD-10-CM | POA: Diagnosis not present

## 2021-11-01 DIAGNOSIS — I509 Heart failure, unspecified: Secondary | ICD-10-CM | POA: Diagnosis not present

## 2021-11-01 DIAGNOSIS — M1711 Unilateral primary osteoarthritis, right knee: Secondary | ICD-10-CM | POA: Diagnosis not present

## 2021-11-03 DIAGNOSIS — D631 Anemia in chronic kidney disease: Secondary | ICD-10-CM | POA: Diagnosis not present

## 2021-11-03 DIAGNOSIS — M1611 Unilateral primary osteoarthritis, right hip: Secondary | ICD-10-CM | POA: Diagnosis not present

## 2021-11-03 DIAGNOSIS — I130 Hypertensive heart and chronic kidney disease with heart failure and stage 1 through stage 4 chronic kidney disease, or unspecified chronic kidney disease: Secondary | ICD-10-CM | POA: Diagnosis not present

## 2021-11-03 DIAGNOSIS — Z7982 Long term (current) use of aspirin: Secondary | ICD-10-CM | POA: Diagnosis not present

## 2021-11-03 DIAGNOSIS — M1711 Unilateral primary osteoarthritis, right knee: Secondary | ICD-10-CM | POA: Diagnosis not present

## 2021-11-03 DIAGNOSIS — I429 Cardiomyopathy, unspecified: Secondary | ICD-10-CM | POA: Diagnosis not present

## 2021-11-03 DIAGNOSIS — I4891 Unspecified atrial fibrillation: Secondary | ICD-10-CM | POA: Diagnosis not present

## 2021-11-03 DIAGNOSIS — Z859 Personal history of malignant neoplasm, unspecified: Secondary | ICD-10-CM | POA: Diagnosis not present

## 2021-11-03 DIAGNOSIS — I509 Heart failure, unspecified: Secondary | ICD-10-CM | POA: Diagnosis not present

## 2021-11-03 DIAGNOSIS — N1832 Chronic kidney disease, stage 3b: Secondary | ICD-10-CM | POA: Diagnosis not present

## 2021-11-03 DIAGNOSIS — E559 Vitamin D deficiency, unspecified: Secondary | ICD-10-CM | POA: Diagnosis not present

## 2021-11-11 DIAGNOSIS — M1611 Unilateral primary osteoarthritis, right hip: Secondary | ICD-10-CM | POA: Diagnosis not present

## 2021-11-11 DIAGNOSIS — I509 Heart failure, unspecified: Secondary | ICD-10-CM | POA: Diagnosis not present

## 2021-11-11 DIAGNOSIS — I130 Hypertensive heart and chronic kidney disease with heart failure and stage 1 through stage 4 chronic kidney disease, or unspecified chronic kidney disease: Secondary | ICD-10-CM | POA: Diagnosis not present

## 2021-11-11 DIAGNOSIS — N1832 Chronic kidney disease, stage 3b: Secondary | ICD-10-CM | POA: Diagnosis not present

## 2021-11-11 DIAGNOSIS — D631 Anemia in chronic kidney disease: Secondary | ICD-10-CM | POA: Diagnosis not present

## 2021-11-11 DIAGNOSIS — M1711 Unilateral primary osteoarthritis, right knee: Secondary | ICD-10-CM | POA: Diagnosis not present

## 2021-11-15 DIAGNOSIS — D631 Anemia in chronic kidney disease: Secondary | ICD-10-CM | POA: Diagnosis not present

## 2021-11-15 DIAGNOSIS — N1832 Chronic kidney disease, stage 3b: Secondary | ICD-10-CM | POA: Diagnosis not present

## 2021-11-15 DIAGNOSIS — I130 Hypertensive heart and chronic kidney disease with heart failure and stage 1 through stage 4 chronic kidney disease, or unspecified chronic kidney disease: Secondary | ICD-10-CM | POA: Diagnosis not present

## 2021-11-15 DIAGNOSIS — M1611 Unilateral primary osteoarthritis, right hip: Secondary | ICD-10-CM | POA: Diagnosis not present

## 2021-11-15 DIAGNOSIS — I509 Heart failure, unspecified: Secondary | ICD-10-CM | POA: Diagnosis not present

## 2021-11-15 DIAGNOSIS — M1711 Unilateral primary osteoarthritis, right knee: Secondary | ICD-10-CM | POA: Diagnosis not present

## 2021-11-22 DIAGNOSIS — D631 Anemia in chronic kidney disease: Secondary | ICD-10-CM | POA: Diagnosis not present

## 2021-11-22 DIAGNOSIS — I509 Heart failure, unspecified: Secondary | ICD-10-CM | POA: Diagnosis not present

## 2021-11-22 DIAGNOSIS — N1832 Chronic kidney disease, stage 3b: Secondary | ICD-10-CM | POA: Diagnosis not present

## 2021-11-22 DIAGNOSIS — I130 Hypertensive heart and chronic kidney disease with heart failure and stage 1 through stage 4 chronic kidney disease, or unspecified chronic kidney disease: Secondary | ICD-10-CM | POA: Diagnosis not present

## 2021-11-22 DIAGNOSIS — M1711 Unilateral primary osteoarthritis, right knee: Secondary | ICD-10-CM | POA: Diagnosis not present

## 2021-11-22 DIAGNOSIS — M1611 Unilateral primary osteoarthritis, right hip: Secondary | ICD-10-CM | POA: Diagnosis not present

## 2021-11-28 ENCOUNTER — Other Ambulatory Visit: Payer: Self-pay | Admitting: Cardiology

## 2021-11-29 DIAGNOSIS — I130 Hypertensive heart and chronic kidney disease with heart failure and stage 1 through stage 4 chronic kidney disease, or unspecified chronic kidney disease: Secondary | ICD-10-CM | POA: Diagnosis not present

## 2021-11-29 DIAGNOSIS — M1611 Unilateral primary osteoarthritis, right hip: Secondary | ICD-10-CM | POA: Diagnosis not present

## 2021-11-29 DIAGNOSIS — N1832 Chronic kidney disease, stage 3b: Secondary | ICD-10-CM | POA: Diagnosis not present

## 2021-11-29 DIAGNOSIS — M1711 Unilateral primary osteoarthritis, right knee: Secondary | ICD-10-CM | POA: Diagnosis not present

## 2021-11-29 DIAGNOSIS — D631 Anemia in chronic kidney disease: Secondary | ICD-10-CM | POA: Diagnosis not present

## 2021-11-29 DIAGNOSIS — I509 Heart failure, unspecified: Secondary | ICD-10-CM | POA: Diagnosis not present

## 2021-12-03 DIAGNOSIS — Z7982 Long term (current) use of aspirin: Secondary | ICD-10-CM | POA: Diagnosis not present

## 2021-12-03 DIAGNOSIS — I429 Cardiomyopathy, unspecified: Secondary | ICD-10-CM | POA: Diagnosis not present

## 2021-12-03 DIAGNOSIS — E559 Vitamin D deficiency, unspecified: Secondary | ICD-10-CM | POA: Diagnosis not present

## 2021-12-03 DIAGNOSIS — Z859 Personal history of malignant neoplasm, unspecified: Secondary | ICD-10-CM | POA: Diagnosis not present

## 2021-12-03 DIAGNOSIS — D631 Anemia in chronic kidney disease: Secondary | ICD-10-CM | POA: Diagnosis not present

## 2021-12-03 DIAGNOSIS — M1711 Unilateral primary osteoarthritis, right knee: Secondary | ICD-10-CM | POA: Diagnosis not present

## 2021-12-03 DIAGNOSIS — N1832 Chronic kidney disease, stage 3b: Secondary | ICD-10-CM | POA: Diagnosis not present

## 2021-12-03 DIAGNOSIS — M1611 Unilateral primary osteoarthritis, right hip: Secondary | ICD-10-CM | POA: Diagnosis not present

## 2021-12-03 DIAGNOSIS — I509 Heart failure, unspecified: Secondary | ICD-10-CM | POA: Diagnosis not present

## 2021-12-03 DIAGNOSIS — I4891 Unspecified atrial fibrillation: Secondary | ICD-10-CM | POA: Diagnosis not present

## 2021-12-03 DIAGNOSIS — I130 Hypertensive heart and chronic kidney disease with heart failure and stage 1 through stage 4 chronic kidney disease, or unspecified chronic kidney disease: Secondary | ICD-10-CM | POA: Diagnosis not present

## 2021-12-11 DIAGNOSIS — I130 Hypertensive heart and chronic kidney disease with heart failure and stage 1 through stage 4 chronic kidney disease, or unspecified chronic kidney disease: Secondary | ICD-10-CM | POA: Diagnosis not present

## 2021-12-11 DIAGNOSIS — I5022 Chronic systolic (congestive) heart failure: Secondary | ICD-10-CM | POA: Diagnosis not present

## 2021-12-11 DIAGNOSIS — I10 Essential (primary) hypertension: Secondary | ICD-10-CM | POA: Diagnosis not present

## 2021-12-11 DIAGNOSIS — N1832 Chronic kidney disease, stage 3b: Secondary | ICD-10-CM | POA: Diagnosis not present

## 2021-12-13 DIAGNOSIS — I509 Heart failure, unspecified: Secondary | ICD-10-CM | POA: Diagnosis not present

## 2021-12-13 DIAGNOSIS — M1611 Unilateral primary osteoarthritis, right hip: Secondary | ICD-10-CM | POA: Diagnosis not present

## 2021-12-13 DIAGNOSIS — I130 Hypertensive heart and chronic kidney disease with heart failure and stage 1 through stage 4 chronic kidney disease, or unspecified chronic kidney disease: Secondary | ICD-10-CM | POA: Diagnosis not present

## 2021-12-13 DIAGNOSIS — M1711 Unilateral primary osteoarthritis, right knee: Secondary | ICD-10-CM | POA: Diagnosis not present

## 2021-12-13 DIAGNOSIS — N1832 Chronic kidney disease, stage 3b: Secondary | ICD-10-CM | POA: Diagnosis not present

## 2021-12-13 DIAGNOSIS — D631 Anemia in chronic kidney disease: Secondary | ICD-10-CM | POA: Diagnosis not present

## 2021-12-27 DIAGNOSIS — M1611 Unilateral primary osteoarthritis, right hip: Secondary | ICD-10-CM | POA: Diagnosis not present

## 2021-12-27 DIAGNOSIS — I130 Hypertensive heart and chronic kidney disease with heart failure and stage 1 through stage 4 chronic kidney disease, or unspecified chronic kidney disease: Secondary | ICD-10-CM | POA: Diagnosis not present

## 2021-12-27 DIAGNOSIS — D631 Anemia in chronic kidney disease: Secondary | ICD-10-CM | POA: Diagnosis not present

## 2021-12-27 DIAGNOSIS — I509 Heart failure, unspecified: Secondary | ICD-10-CM | POA: Diagnosis not present

## 2021-12-27 DIAGNOSIS — N1832 Chronic kidney disease, stage 3b: Secondary | ICD-10-CM | POA: Diagnosis not present

## 2021-12-27 DIAGNOSIS — M1711 Unilateral primary osteoarthritis, right knee: Secondary | ICD-10-CM | POA: Diagnosis not present

## 2022-01-24 DIAGNOSIS — N1832 Chronic kidney disease, stage 3b: Secondary | ICD-10-CM | POA: Diagnosis not present

## 2022-01-24 DIAGNOSIS — R7989 Other specified abnormal findings of blood chemistry: Secondary | ICD-10-CM | POA: Diagnosis not present

## 2022-01-24 DIAGNOSIS — E038 Other specified hypothyroidism: Secondary | ICD-10-CM | POA: Diagnosis not present

## 2022-01-24 DIAGNOSIS — E785 Hyperlipidemia, unspecified: Secondary | ICD-10-CM | POA: Diagnosis not present

## 2022-01-24 DIAGNOSIS — I10 Essential (primary) hypertension: Secondary | ICD-10-CM | POA: Diagnosis not present

## 2022-01-24 DIAGNOSIS — E559 Vitamin D deficiency, unspecified: Secondary | ICD-10-CM | POA: Diagnosis not present

## 2022-01-31 DIAGNOSIS — M159 Polyosteoarthritis, unspecified: Secondary | ICD-10-CM | POA: Diagnosis not present

## 2022-01-31 DIAGNOSIS — R82998 Other abnormal findings in urine: Secondary | ICD-10-CM | POA: Diagnosis not present

## 2022-01-31 DIAGNOSIS — N1832 Chronic kidney disease, stage 3b: Secondary | ICD-10-CM | POA: Diagnosis not present

## 2022-01-31 DIAGNOSIS — I5022 Chronic systolic (congestive) heart failure: Secondary | ICD-10-CM | POA: Diagnosis not present

## 2022-01-31 DIAGNOSIS — Z1339 Encounter for screening examination for other mental health and behavioral disorders: Secondary | ICD-10-CM | POA: Diagnosis not present

## 2022-01-31 DIAGNOSIS — Z0000 Encounter for general adult medical examination without abnormal findings: Secondary | ICD-10-CM | POA: Diagnosis not present

## 2022-01-31 DIAGNOSIS — I130 Hypertensive heart and chronic kidney disease with heart failure and stage 1 through stage 4 chronic kidney disease, or unspecified chronic kidney disease: Secondary | ICD-10-CM | POA: Diagnosis not present

## 2022-01-31 DIAGNOSIS — D631 Anemia in chronic kidney disease: Secondary | ICD-10-CM | POA: Diagnosis not present

## 2022-01-31 DIAGNOSIS — Z1331 Encounter for screening for depression: Secondary | ICD-10-CM | POA: Diagnosis not present

## 2022-02-12 DIAGNOSIS — H6123 Impacted cerumen, bilateral: Secondary | ICD-10-CM | POA: Diagnosis not present

## 2022-02-12 DIAGNOSIS — H9193 Unspecified hearing loss, bilateral: Secondary | ICD-10-CM | POA: Diagnosis not present

## 2022-02-28 DIAGNOSIS — C44619 Basal cell carcinoma of skin of left upper limb, including shoulder: Secondary | ICD-10-CM | POA: Diagnosis not present

## 2022-02-28 DIAGNOSIS — C44629 Squamous cell carcinoma of skin of left upper limb, including shoulder: Secondary | ICD-10-CM | POA: Diagnosis not present

## 2022-02-28 DIAGNOSIS — C44311 Basal cell carcinoma of skin of nose: Secondary | ICD-10-CM | POA: Diagnosis not present

## 2022-04-25 DIAGNOSIS — C44629 Squamous cell carcinoma of skin of left upper limb, including shoulder: Secondary | ICD-10-CM | POA: Diagnosis not present

## 2022-04-25 DIAGNOSIS — L570 Actinic keratosis: Secondary | ICD-10-CM | POA: Diagnosis not present

## 2022-04-25 DIAGNOSIS — C44311 Basal cell carcinoma of skin of nose: Secondary | ICD-10-CM | POA: Diagnosis not present

## 2022-07-24 NOTE — Progress Notes (Signed)
Cardiology Office Note    Date:  07/31/2022   ID:  Julie, Lara Dec 04, 1920, MRN [id number]  PCP:  Geoffry Paradise, MD  Cardiologist:  Dr. [place]  Chief Complaint  Patient presents with   Atrial Fibrillation   Congestive Heart Failure    History of Present Illness:  Julie Lara is a 89 y.o. female with PMH of CAD (s/p BMS to RCA in 2014), PAF (not on systemic anticoagulation 2/2 GIB), moderate AI, hypothyroidism, and stage III CKD. She was admitted in April 2018 for evaluation chest pain. Heart rate was 100 at that time. Her Lopressor was increased to 25 mg twice a day. Serial troponin was negative. No further ischemic workup was pursued. She was readmitted  in November 2018 with CHF exacerbation. She was diuresed with IV Lasix. Troponin minimally elevated however had a flat trend. BNP was 940. She was in atrial fibrillation with RVR with heart rate of 120. Echocardiogram obtained on 02/02/2017 showed reduction of EF down to 35-40%, diffuse hypokinesis, moderate LVH, dilated ascending aorta at 43 mm, moderate AI, moderate MR, severely dilated left and right atrium, peak PA pressure 33 mmHg.   She was admitted in December 2018 with acute CHF. Diuresed with IV lasix. DC home on 40 mg daily. BNP elevated to 1583. CXR showed pulmonary edema. DC weight 127 lbs. Barium esophagram showed a stricture at the GE junction but she was asymptomatic from this.  When seen in April 2021 she noted increase swelling in her legs and feet. Gets SOB if she moves around much. On our scale weight is up 3 lbs. We increased her diuretic and on follow up one month later she was clinically better and BNP improved.  Continued on lasix 40 mg bid.   On follow up today she is doing very well. Just turned 89.  No significant SOB. Swelling is controlled.  Overall she has no complaints. Is not aware of Afib. She is doing daily PT exercises and is able to get around her house with a walker.    Past Medical  History:  Diagnosis Date   Acute blood loss anemia    November 2014   Aortic insufficiency    mild to moderate   Arthritis    "right upper arm" (02/01/2017)   Atrial fibrillation (HCC)    CAD (coronary artery disease)    Chest pain 04-2010   atypical with negative echocardiogram   Dilated cardiomyopathy (HCC)    Goiter    Hypothyroidism    Idiopathic cardiomyopathy (HCC) 1999   EF 40% by Echo on 10/13/12   NSTEMI (non-ST elevated myocardial infarction) [facility]) July 2014   95% prox RCA s/p BMS   PAC (premature atrial contraction)    Chronic    Past Surgical History:  Procedure Laterality Date   biopsy of sacral lesion  2010   CARDIAC CATHETERIZATION     COLONOSCOPY N/A 02/23/2013   Procedure: COLONOSCOPY;  Surgeon: Meryl Dare, MD;  Location: [facility] ENDOSCOPY;  Service: Endoscopy;  Laterality: N/A;   CORONARY ANGIOPLASTY WITH STENT PLACEMENT  09/2012   BMS-95% prox RCA   ESOPHAGOGASTRODUODENOSCOPY N/A 02/22/2013   Procedure: ESOPHAGOGASTRODUODENOSCOPY (EGD);  Surgeon: Rachael Fee, MD;  Location: [facility] ENDOSCOPY;  Service: Endoscopy;  Laterality: N/A;   LEFT HEART CATHETERIZATION WITH CORONARY ANGIOGRAM N/A 10/14/2012   Procedure: LEFT HEART CATHETERIZATION WITH CORONARY ANGIOGRAM;  Surgeon: Iran Ouch, MD;  Location: [facility] CATH LAB;  Service: Cardiovascular;  Laterality: N/A;   PERCUTANEOUS  CORONARY STENT INTERVENTION (PCI-S) Right 10/14/2012   Procedure: PERCUTANEOUS CORONARY STENT INTERVENTION (PCI-S);  Surgeon: Iran Ouch, MD;  Location: [facility] CATH LAB;  Service: Cardiovascular;  Laterality: Right;   THYROIDECTOMY  2008   VAGINAL HYSTERECTOMY     Complete    Current Medications: Outpatient Medications Prior to Visit  Medication Sig Dispense Refill   aspirin EC 81 MG tablet Take 81 mg by mouth daily.     atorvastatin (LIPITOR) 40 MG tablet TAKE 1 TABLET BY MOUTH DAILY AT 6 PM. 90 tablet 3   Cyanocobalamin (VITAMIN B-12 PO) Take 1 tablet by mouth daily.     furosemide  (LASIX) 40 MG tablet TAKE 1 TABLET BY MOUTH TWICE A DAY 180 tablet 3   iron polysaccharides (NIFEREX) 150 MG capsule Take 150 mg by mouth daily.      lactose free nutrition (BOOST) LIQD Take 237 mLs by mouth 3 (three) times daily between meals. Chocolate flavor     levothyroxine (SYNTHROID, LEVOTHROID) 25 MCG tablet Take 25 mcg by mouth daily before breakfast.     metoprolol tartrate (LOPRESSOR) 25 MG tablet Take 1 tablet (25 mg total) by mouth 2 (two) times daily. 180 tablet 3   nitroGLYCERIN (NITROSTAT) 0.4 MG SL tablet Place 1 tablet (0.4 mg total) under the tongue every 5 (five) minutes as needed for chest pain. 25 tablet 3   pantoprazole (PROTONIX) 40 MG tablet Take 1 tablet (40 mg total) by mouth daily at 6 (six) AM. 30 tablet 0   No facility-administered medications prior to visit.     Allergies:   Patient has no known allergies.   Social History   Socioeconomic History   Marital status: Widowed    Spouse name: Not on file   Number of children: Not on file   Years of education: Not on file   Highest education level: Not on file  Occupational History   Occupation: Retired    Associate Professor: [employer]  Tobacco Use   Smoking status: Never   Smokeless tobacco: Never  Vaping Use   Vaping Use: Never used  Substance and Sexual Activity   Alcohol use: No   Drug use: No   Sexual activity: Not Currently  Other Topics Concern   Not on file  Social History Narrative   Pt lives alone, but has family close by.   Social Determinants of Health   Financial Resource Strain: Not on file  Food Insecurity: Not on file  Transportation Needs: Not on file  Physical Activity: Not on file  Stress: Not on file  Social Connections: Not on file     Family History:  The patient's family history includes Diabetes in her sister.   ROS:   Please see the history of present illness.    ROS All other systems reviewed and are negative.   PHYSICAL EXAM:   VS:  BP 116/68   Pulse 86   Ht  5\' 4"  (1.626 m)   Wt 123 lb 6.4 oz (56 kg)   BMI 21.18 kg/m    GENERAL:  Well appearing, elderly WF in NAD HEENT:  PERRL, EOMI, sclera are clear. Oropharynx is clear. NECK:  No jugular venous distention, carotid upstroke brisk and symmetric, no bruits, no thyromegaly or adenopathy LUNGS:  Clear to auscultation bilaterally CHEST:  Unremarkable HEART:  IRRR,  PMI not displaced or sustained,S1 and S2 within normal limits, no S3, no S4: no clicks, no rubs, no murmurs ABD:  Soft, nontender. BS +, no masses  or bruits. No hepatomegaly, no splenomegaly EXT:  2 + pulses throughout, tr ankle edema, no cyanosis no clubbing SKIN:  Warm and dry.  No rashes NEURO:  Alert and oriented x 3. Cranial nerves II through XII intact. PSYCH:  Cognitively intact   Wt Readings from Last 3 Encounters:  07/31/22 123 lb 6.4 oz (56 kg)  06/28/21 120 lb 9.6 oz (54.7 kg)  03/02/20 129 lb 12.8 oz (58.9 kg)      Studies/Labs Reviewed:   EKG:  EKG is ordered today. Afib rate 88. RBBB. I have personally reviewed and interpreted this study.    Recent Labs: No results found for requested labs within last 365 days.   Lipid Panel    Component Value Date/Time   CHOL 146 12/15/2012 1552   TRIG 54.0 12/15/2012 1552   HDL 64.10 12/15/2012 1552   CHOLHDL 2 12/15/2012 1552   VLDL 10.8 12/15/2012 1552   LDLCALC 71 12/15/2012 1552   Labs dated 12/19/16: cholesterol 150, triglycerides 77, HDL 64, LDL 71.  Dated 12/25/17: cholesterol 136, triglycerides 67, HDL 55, LDL 68. Hgb 11.4. creatinine 1.4. Other chemistries and TSH normal. Dated 01/06/19: cholesterol 148, triglycerides 66,HDL 67, LDL 68. Hgb 11.3. creatinine 1.6. TSH 7.82. otherwise CBC and CMET normal Dated 01/16/21: cholesterol 125, triglycerides 63, HDL 54, LDL 58. LFTs and electrolytes normal Dated 01/24/22: cholesterol 144, triglycerides 82, HDL 58, LDL 70. CMET and TSH normal.   Additional studies/ records that were reviewed today include:   Echo  02/02/2017 LV EF: 35% -   40%   Study Conclusions   - Left ventricle: The cavity size was normal. Wall thickness was   increased in a pattern of moderate LVH. Systolic function was   moderately reduced. The estimated ejection fraction was in the   range of 35% to 40%. Diffuse hypokinesis. The study is not   technically sufficient to allow evaluation of LV diastolic   function. - Aortic valve: Trileaflet; mildly calcified leaflets. There was   moderate regurgitation. - Aorta: Ascending aortic diameter: 43 mm (S). - Ascending aorta: The ascending aorta was mildly dilated. - Mitral valve: Mildly thickened leaflets . There was moderate   regurgitation. - Left atrium: Severely dilated. - Right ventricle: The cavity size was mildly dilated. Systolic   function is mildly reduced. - Right atrium: Severely dilated. - Atrial septum: Bows from left to right, suggesting of high LA >   RA filling pressure. - Tricuspid valve: There was mild regurgitation. - Pulmonary arteries: PA peak pressure: 33 mm Hg (S). - Inferior vena cava: The vessel was dilated. The respirophasic   diameter changes were blunted (< 50%), consistent with elevated   central venous pressure.   Impressions:   - Compared to a prior study in 2014, the LVEF is lower at 35-40%   with diffuse hypokinesis, moderate AI and MR, mild TR, severe   biatrial enlargement. Rhythm appears to be atrial fibrillation.   ASSESSMENT:    1. Chronic systolic congestive heart failure (HCC)   2. Persistent atrial fibrillation (HCC)   3. Coronary artery disease involving native coronary artery of native heart without angina pectoris   4. Moderate aortic regurgitation      PLAN:  In order of problems listed above:  CAD: asymptomatic, continue aspirin, Lipitor, and metoprolol.  Persistent atrial fibrillation/ permanent: HR is well controlled on Toprol. She is not a candidate for long term anticoagulation due to history of GI bleed. Not a  candidate for Watchman device  due to advanced age. On ASA. No history of stroke.   Chronic systolic heart failure: EF 35-40% on echocardiogram. She is well compensated. I have recommended continuing  lasix to 40 mg bid.  Reinforce sodium restriction.  Moderate aortic regurgitation: Stable, continue observation.  Hypothyroidism: On Synthroid. TSH normal.   CKD stage III stable   Will follow up in one year  Medication Adjustments/Labs and Tests Ordered: Current medicines are reviewed at length with the patient today.  Concerns regarding medicines are outlined above.  Medication changes, Labs and Tests ordered today are listed in the Patient Instructions below. There are no Patient Instructions on file for this visit.   Signed, Zaire Vanbuskirk Sw[place], MD  07/31/2022 8:30 AM    [facility]

## 2022-07-31 ENCOUNTER — Ambulatory Visit: Payer: Medicare Other | Attending: Cardiology | Admitting: Cardiology

## 2022-07-31 ENCOUNTER — Encounter: Payer: Self-pay | Admitting: Cardiology

## 2022-07-31 VITALS — BP 116/68 | HR 86 | Ht 64.0 in | Wt 123.4 lb

## 2022-07-31 DIAGNOSIS — I4819 Other persistent atrial fibrillation: Secondary | ICD-10-CM

## 2022-07-31 DIAGNOSIS — I2510 Atherosclerotic heart disease of native coronary artery without angina pectoris: Secondary | ICD-10-CM | POA: Diagnosis not present

## 2022-07-31 DIAGNOSIS — I351 Nonrheumatic aortic (valve) insufficiency: Secondary | ICD-10-CM | POA: Diagnosis not present

## 2022-07-31 DIAGNOSIS — I5022 Chronic systolic (congestive) heart failure: Secondary | ICD-10-CM

## 2022-07-31 NOTE — Patient Instructions (Signed)
Medication Instructions:  Continue same medications *If you need a refill on your cardiac medications before your next appointment, please call your pharmacy*   Lab Work: Continue same medications   Testing/Procedures: None ordered   Follow-Up: At [facility], you and your health needs are our priority.  As part of our continuing mission to provide you with exceptional heart care, we have created designated Provider Care Teams.  These Care Teams include your primary Cardiologist (physician) and Advanced Practice Providers (APPs -  Physician Assistants and Nurse Practitioners) who all work together to provide you with the care you need, when you need it.  We recommend signing up for the patient portal called "MyChart".  Sign up information is provided on this After Visit Summary.  MyChart is used to connect with patients for Virtual Visits (Telemedicine).  Patients are able to view lab/test results, encounter notes, upcoming appointments, etc.  Non-urgent messages can be sent to your provider as well.   To learn more about what you can do with MyChart, go to [email or web address].    Your next appointment:  1 year   Call in Jan to schedule April appointment     Provider:  Dr.Jordan

## 2022-08-01 DIAGNOSIS — E038 Other specified hypothyroidism: Secondary | ICD-10-CM | POA: Diagnosis not present

## 2022-08-01 DIAGNOSIS — N1832 Chronic kidney disease, stage 3b: Secondary | ICD-10-CM | POA: Diagnosis not present

## 2022-08-01 DIAGNOSIS — I4891 Unspecified atrial fibrillation: Secondary | ICD-10-CM | POA: Diagnosis not present

## 2022-08-01 DIAGNOSIS — M545 Low back pain, unspecified: Secondary | ICD-10-CM | POA: Diagnosis not present

## 2022-08-01 DIAGNOSIS — M159 Polyosteoarthritis, unspecified: Secondary | ICD-10-CM | POA: Diagnosis not present

## 2022-08-01 DIAGNOSIS — I5022 Chronic systolic (congestive) heart failure: Secondary | ICD-10-CM | POA: Diagnosis not present

## 2022-08-01 DIAGNOSIS — I130 Hypertensive heart and chronic kidney disease with heart failure and stage 1 through stage 4 chronic kidney disease, or unspecified chronic kidney disease: Secondary | ICD-10-CM | POA: Diagnosis not present

## 2022-09-17 ENCOUNTER — Other Ambulatory Visit: Payer: Self-pay | Admitting: Cardiology

## 2022-12-13 ENCOUNTER — Other Ambulatory Visit: Payer: Self-pay | Admitting: Cardiology

## 2023-02-04 DIAGNOSIS — E559 Vitamin D deficiency, unspecified: Secondary | ICD-10-CM | POA: Diagnosis not present

## 2023-02-04 DIAGNOSIS — N1832 Chronic kidney disease, stage 3b: Secondary | ICD-10-CM | POA: Diagnosis not present

## 2023-02-04 DIAGNOSIS — I130 Hypertensive heart and chronic kidney disease with heart failure and stage 1 through stage 4 chronic kidney disease, or unspecified chronic kidney disease: Secondary | ICD-10-CM | POA: Diagnosis not present

## 2023-02-04 DIAGNOSIS — Z1339 Encounter for screening examination for other mental health and behavioral disorders: Secondary | ICD-10-CM | POA: Diagnosis not present

## 2023-02-04 DIAGNOSIS — Z1331 Encounter for screening for depression: Secondary | ICD-10-CM | POA: Diagnosis not present

## 2023-02-04 DIAGNOSIS — E038 Other specified hypothyroidism: Secondary | ICD-10-CM | POA: Diagnosis not present

## 2023-02-04 DIAGNOSIS — Z23 Encounter for immunization: Secondary | ICD-10-CM | POA: Diagnosis not present

## 2023-02-04 DIAGNOSIS — I5022 Chronic systolic (congestive) heart failure: Secondary | ICD-10-CM | POA: Diagnosis not present

## 2023-02-04 DIAGNOSIS — E785 Hyperlipidemia, unspecified: Secondary | ICD-10-CM | POA: Diagnosis not present

## 2023-02-04 DIAGNOSIS — Z0000 Encounter for general adult medical examination without abnormal findings: Secondary | ICD-10-CM | POA: Diagnosis not present

## 2023-02-04 DIAGNOSIS — Z1389 Encounter for screening for other disorder: Secondary | ICD-10-CM | POA: Diagnosis not present

## 2023-02-04 DIAGNOSIS — I4891 Unspecified atrial fibrillation: Secondary | ICD-10-CM | POA: Diagnosis not present

## 2023-02-04 DIAGNOSIS — D649 Anemia, unspecified: Secondary | ICD-10-CM | POA: Diagnosis not present

## 2023-06-14 DIAGNOSIS — H1789 Other corneal scars and opacities: Secondary | ICD-10-CM | POA: Diagnosis not present

## 2023-06-14 DIAGNOSIS — H524 Presbyopia: Secondary | ICD-10-CM | POA: Diagnosis not present

## 2023-06-14 DIAGNOSIS — Z961 Presence of intraocular lens: Secondary | ICD-10-CM | POA: Diagnosis not present

## 2023-07-08 ENCOUNTER — Other Ambulatory Visit: Payer: Self-pay

## 2023-07-08 ENCOUNTER — Encounter (HOSPITAL_COMMUNITY): Payer: Self-pay

## 2023-07-08 ENCOUNTER — Emergency Department (HOSPITAL_COMMUNITY)
Admission: EM | Admit: 2023-07-08 | Discharge: 2023-07-09 | Disposition: A | Discharge status: Home / Self Care | Attending: Emergency Medicine | Admitting: Emergency Medicine

## 2023-07-08 ENCOUNTER — Emergency Department (HOSPITAL_COMMUNITY)

## 2023-07-08 DIAGNOSIS — I509 Heart failure, unspecified: Secondary | ICD-10-CM | POA: Diagnosis not present

## 2023-07-08 DIAGNOSIS — I959 Hypotension, unspecified: Secondary | ICD-10-CM | POA: Diagnosis not present

## 2023-07-08 DIAGNOSIS — I2510 Atherosclerotic heart disease of native coronary artery without angina pectoris: Secondary | ICD-10-CM | POA: Diagnosis not present

## 2023-07-08 DIAGNOSIS — R0602 Shortness of breath: Secondary | ICD-10-CM | POA: Insufficient documentation

## 2023-07-08 DIAGNOSIS — Z79899 Other long term (current) drug therapy: Secondary | ICD-10-CM | POA: Diagnosis not present

## 2023-07-08 DIAGNOSIS — Z7982 Long term (current) use of aspirin: Secondary | ICD-10-CM | POA: Diagnosis not present

## 2023-07-08 DIAGNOSIS — I700 Atherosclerosis of aorta: Secondary | ICD-10-CM | POA: Diagnosis not present

## 2023-07-08 DIAGNOSIS — I517 Cardiomegaly: Secondary | ICD-10-CM | POA: Diagnosis not present

## 2023-07-08 LAB — CBC WITH DIFFERENTIAL/PLATELET
Abs Immature Granulocytes: 0 10*3/uL (ref 0.00–0.07)
Basophils Absolute: 0 10*3/uL (ref 0.0–0.1)
Basophils Relative: 0 %
Eosinophils Absolute: 0 10*3/uL (ref 0.0–0.5)
Eosinophils Relative: 0 %
HCT: 38.1 % (ref 36.0–46.0)
Hemoglobin: 11.8 g/dL — ABNORMAL LOW (ref 12.0–15.0)
Immature Granulocytes: 0 %
Lymphocytes Relative: 50 %
Lymphs Abs: 2.2 10*3/uL (ref 0.7–4.0)
MCH: 30.3 pg (ref 26.0–34.0)
MCHC: 31 g/dL (ref 30.0–36.0)
MCV: 97.7 fL (ref 80.0–100.0)
Monocytes Absolute: 0.9 10*3/uL (ref 0.1–1.0)
Monocytes Relative: 19 %
Neutro Abs: 1.4 10*3/uL — ABNORMAL LOW (ref 1.7–7.7)
Neutrophils Relative %: 31 %
Platelets: 92 10*3/uL — ABNORMAL LOW (ref 150–400)
RBC: 3.9 MIL/uL (ref 3.87–5.11)
RDW: 16.2 % — ABNORMAL HIGH (ref 11.5–15.5)
Smear Review: NORMAL
WBC: 4.5 10*3/uL (ref 4.0–10.5)
nRBC: 0 % (ref 0.0–0.2)

## 2023-07-08 LAB — COMPREHENSIVE METABOLIC PANEL WITH GFR
ALT: 22 U/L (ref 0–44)
AST: 33 U/L (ref 15–41)
Albumin: 3.5 g/dL (ref 3.5–5.0)
Alkaline Phosphatase: 96 U/L (ref 38–126)
Anion gap: 13 (ref 5–15)
BUN: 22 mg/dL (ref 8–23)
CO2: 29 mmol/L (ref 22–32)
Calcium: 9.2 mg/dL (ref 8.9–10.3)
Chloride: 100 mmol/L (ref 98–111)
Creatinine, Ser: 1.32 mg/dL — ABNORMAL HIGH (ref 0.44–1.00)
GFR, Estimated: 36 mL/min — ABNORMAL LOW (ref >60)
Glucose, Bld: 120 mg/dL — ABNORMAL HIGH (ref 70–99)
Potassium: 3.6 mmol/L (ref 3.5–5.1)
Sodium: 142 mmol/L (ref 135–145)
Total Bilirubin: 1 mg/dL (ref 0.0–1.2)
Total Protein: 6.9 g/dL (ref 6.5–8.1)

## 2023-07-08 LAB — BRAIN NATRIURETIC PEPTIDE: B Natriuretic Peptide: 497.8 pg/mL — ABNORMAL HIGH (ref 0.0–100.0)

## 2023-07-08 LAB — TROPONIN I (HIGH SENSITIVITY)
Troponin I (High Sensitivity): 20 ng/L — ABNORMAL HIGH (ref <18)
Troponin I (High Sensitivity): 20 ng/L — ABNORMAL HIGH (ref <18)

## 2023-07-08 NOTE — ED Notes (Signed)
 Pt placed on 2L NC for comfort.

## 2023-07-08 NOTE — ED Notes (Signed)
 Writer notified CCMD of patient being on cardiac monitor.

## 2023-07-08 NOTE — ED Provider Notes (Signed)
 [place] EMERGENCY DEPARTMENT AT [facility] Provider Note   CSN: [id number] Arrival date & time: 07/08/23  2023     History {Add pertinent medical, surgical, social history, OB history to HPI:1} Chief Complaint  Patient presents with   Shortness of Breath    Julie Lara is a 89 y.o. female.   Shortness of Breath      Home Medications Prior to Admission medications   Medication Sig Start Date End Date Taking? Authorizing Provider  aspirin EC 81 MG tablet Take 81 mg by mouth daily.    [provider]  atorvastatin (LIPITOR) 40 MG tablet TAKE 1 TABLET BY MOUTH DAILY AT 6 PM. 12/13/22   [place], Peter M, MD  Cyanocobalamin (VITAMIN B-12 PO) Take 1 tablet by mouth daily.    [provider]  furosemide (LASIX) 40 MG tablet TAKE 1 TABLET BY MOUTH TWICE A DAY 09/18/22   [place], Peter M, MD  iron polysaccharides (NIFEREX) 150 MG capsule Take 150 mg by mouth daily.     [provider]  lactose free nutrition (BOOST) LIQD Take 237 mLs by mouth 3 (three) times daily between meals. Chocolate flavor    [provider]  levothyroxine (SYNTHROID, LEVOTHROID) 25 MCG tablet Take 25 mcg by mouth daily before breakfast.    [provider]  metoprolol tartrate (LOPRESSOR) 25 MG tablet TAKE 1 TABLET BY MOUTH TWICE A DAY 09/18/22   [place], Peter M, MD  nitroGLYCERIN (NITROSTAT) 0.4 MG SL tablet Place 1 tablet (0.4 mg total) under the tongue every 5 (five) minutes as needed for chest pain. 06/28/21   [place], Peter M, MD  pantoprazole (PROTONIX) 40 MG tablet Take 1 tablet (40 mg total) by mouth daily at 6 (six) AM. 03/09/17   Regalado, Prentiss Bells, MD      Allergies    Patient has no known allergies.    Review of Systems   Review of Systems  Respiratory:  Positive for shortness of breath.     Physical Exam Updated Vital Signs BP 116/82 (BP Location: Right Arm)   Pulse (!) 107   Temp 98.4 F (36.9 C) (Oral)   Resp (!) 30   Ht 5\' 4"   (1.626 m)   Wt 54 kg   SpO2 93%   BMI 20.43 kg/m  Physical Exam  ED Results / Procedures / Treatments   Labs (all labs ordered are listed, but only abnormal results are displayed) Labs Reviewed  RESP PANEL BY RT-PCR (RSV, FLU A&B, COVID)  RVPGX2  BRAIN NATRIURETIC PEPTIDE  COMPREHENSIVE METABOLIC PANEL WITH GFR  CBC WITH DIFFERENTIAL/PLATELET  TROPONIN I (HIGH SENSITIVITY)    EKG None  Radiology No results found.  Procedures Procedures  {Document cardiac monitor, telemetry assessment procedure when appropriate:1}  Medications Ordered in ED Medications - No data to display  ED Course/ Medical Decision Making/ A&P   {   Click here for ABCD2, HEART and other calculatorsREFRESH Note before signing :1}                              Medical Decision Making Amount and/or Complexity of Data Reviewed Labs: ordered. Radiology: ordered.   ***  {Document critical care time when appropriate:1} {Document review of labs and clinical decision tools ie heart score, Chads2Vasc2 etc:1}  {Document your independent review of radiology images, and any outside records:1} {Document your discussion with family members, caretakers, and with consultants:1} {Document social determinants  of health affecting pt's care:1} {Document your decision making why or why not admission, treatments were needed:1} Final Clinical Impression(s) / ED Diagnoses Final diagnoses:  None    Rx / DC Orders ED Discharge Orders     None

## 2023-07-08 NOTE — ED Triage Notes (Signed)
 Patient arrived from home via EMS with c/o shortness of breath x2 days. Patient stated when she is supine and on exertion she gets short of breath. EMS stated they gave 2 duonebs due to wheezing. Patient also noted with +3 edema to lower extremities,

## 2023-07-09 DIAGNOSIS — I130 Hypertensive heart and chronic kidney disease with heart failure and stage 1 through stage 4 chronic kidney disease, or unspecified chronic kidney disease: Secondary | ICD-10-CM | POA: Diagnosis not present

## 2023-07-09 DIAGNOSIS — J069 Acute upper respiratory infection, unspecified: Secondary | ICD-10-CM | POA: Diagnosis not present

## 2023-07-09 DIAGNOSIS — R051 Acute cough: Secondary | ICD-10-CM | POA: Diagnosis not present

## 2023-07-09 DIAGNOSIS — I5022 Chronic systolic (congestive) heart failure: Secondary | ICD-10-CM | POA: Diagnosis not present

## 2023-07-09 DIAGNOSIS — J40 Bronchitis, not specified as acute or chronic: Secondary | ICD-10-CM | POA: Diagnosis not present

## 2023-07-09 DIAGNOSIS — Z7401 Bed confinement status: Secondary | ICD-10-CM | POA: Diagnosis not present

## 2023-07-09 DIAGNOSIS — R531 Weakness: Secondary | ICD-10-CM | POA: Diagnosis not present

## 2023-07-09 DIAGNOSIS — N1832 Chronic kidney disease, stage 3b: Secondary | ICD-10-CM | POA: Diagnosis not present

## 2023-07-09 LAB — RESP PANEL BY RT-PCR (RSV, FLU A&B, COVID)  RVPGX2
Influenza A by PCR: NEGATIVE
Influenza B by PCR: NEGATIVE
Resp Syncytial Virus by PCR: NEGATIVE
SARS Coronavirus 2 by RT PCR: NEGATIVE

## 2023-07-09 NOTE — Discharge Instructions (Signed)
 Julie Lara was seen in the emergency department for shortness of breath Her chest x-ray looked okay and her lab work looked okay as well Her COVID RSV and influenza test were all negative We are not certain what is causing her shortness of breath but do not think she needs to stay in the hospital at this time It may be related to some seasonal allergies  She was able to maintain good oxygen saturation on room air Keep her primary care doctor appointment tomorrow with to discuss further evaluation of her shortness of breath She should return to the emergency department for severe shortness of breath, chest pain or any other concerns

## 2023-07-12 ENCOUNTER — Emergency Department (HOSPITAL_COMMUNITY)

## 2023-07-12 ENCOUNTER — Other Ambulatory Visit: Payer: Self-pay

## 2023-07-12 ENCOUNTER — Encounter (HOSPITAL_COMMUNITY): Payer: Self-pay | Admitting: *Deleted

## 2023-07-12 ENCOUNTER — Inpatient Hospital Stay (HOSPITAL_COMMUNITY)
Admission: EM | Admit: 2023-07-12 | Discharge: 2023-07-20 | DRG: 193 | Disposition: A | Discharge status: Hospice | Attending: Internal Medicine | Admitting: Internal Medicine

## 2023-07-12 DIAGNOSIS — Z6820 Body mass index (BMI) 20.0-20.9, adult: Secondary | ICD-10-CM

## 2023-07-12 DIAGNOSIS — J123 Human metapneumovirus pneumonia: Secondary | ICD-10-CM | POA: Diagnosis not present

## 2023-07-12 DIAGNOSIS — Z7401 Bed confinement status: Secondary | ICD-10-CM | POA: Diagnosis not present

## 2023-07-12 DIAGNOSIS — R739 Hyperglycemia, unspecified: Secondary | ICD-10-CM | POA: Diagnosis not present

## 2023-07-12 DIAGNOSIS — N1832 Chronic kidney disease, stage 3b: Secondary | ICD-10-CM | POA: Diagnosis not present

## 2023-07-12 DIAGNOSIS — Z7982 Long term (current) use of aspirin: Secondary | ICD-10-CM

## 2023-07-12 DIAGNOSIS — Z79890 Hormone replacement therapy (postmenopausal): Secondary | ICD-10-CM

## 2023-07-12 DIAGNOSIS — E161 Other hypoglycemia: Secondary | ICD-10-CM | POA: Diagnosis not present

## 2023-07-12 DIAGNOSIS — Z79899 Other long term (current) drug therapy: Secondary | ICD-10-CM

## 2023-07-12 DIAGNOSIS — E890 Postprocedural hypothyroidism: Secondary | ICD-10-CM | POA: Diagnosis present

## 2023-07-12 DIAGNOSIS — E43 Unspecified severe protein-calorie malnutrition: Secondary | ICD-10-CM | POA: Diagnosis not present

## 2023-07-12 DIAGNOSIS — I2510 Atherosclerotic heart disease of native coronary artery without angina pectoris: Secondary | ICD-10-CM | POA: Diagnosis present

## 2023-07-12 DIAGNOSIS — D649 Anemia, unspecified: Secondary | ICD-10-CM | POA: Diagnosis present

## 2023-07-12 DIAGNOSIS — E873 Alkalosis: Secondary | ICD-10-CM | POA: Diagnosis present

## 2023-07-12 DIAGNOSIS — D696 Thrombocytopenia, unspecified: Secondary | ICD-10-CM | POA: Diagnosis not present

## 2023-07-12 DIAGNOSIS — Z833 Family history of diabetes mellitus: Secondary | ICD-10-CM | POA: Diagnosis not present

## 2023-07-12 DIAGNOSIS — R410 Disorientation, unspecified: Secondary | ICD-10-CM | POA: Diagnosis not present

## 2023-07-12 DIAGNOSIS — I10 Essential (primary) hypertension: Secondary | ICD-10-CM | POA: Diagnosis present

## 2023-07-12 DIAGNOSIS — I130 Hypertensive heart and chronic kidney disease with heart failure and stage 1 through stage 4 chronic kidney disease, or unspecified chronic kidney disease: Secondary | ICD-10-CM | POA: Diagnosis present

## 2023-07-12 DIAGNOSIS — J9601 Acute respiratory failure with hypoxia: Secondary | ICD-10-CM | POA: Diagnosis not present

## 2023-07-12 DIAGNOSIS — I517 Cardiomegaly: Secondary | ICD-10-CM | POA: Diagnosis not present

## 2023-07-12 DIAGNOSIS — R531 Weakness: Secondary | ICD-10-CM | POA: Diagnosis not present

## 2023-07-12 DIAGNOSIS — Z90710 Acquired absence of both cervix and uterus: Secondary | ICD-10-CM | POA: Diagnosis not present

## 2023-07-12 DIAGNOSIS — I7121 Aneurysm of the ascending aorta, without rupture: Secondary | ICD-10-CM | POA: Diagnosis not present

## 2023-07-12 DIAGNOSIS — Z7189 Other specified counseling: Secondary | ICD-10-CM

## 2023-07-12 DIAGNOSIS — I5022 Chronic systolic (congestive) heart failure: Secondary | ICD-10-CM | POA: Diagnosis present

## 2023-07-12 DIAGNOSIS — I4891 Unspecified atrial fibrillation: Secondary | ICD-10-CM | POA: Diagnosis not present

## 2023-07-12 DIAGNOSIS — R0602 Shortness of breath: Secondary | ICD-10-CM | POA: Diagnosis not present

## 2023-07-12 DIAGNOSIS — R000 Tachycardia, unspecified: Secondary | ICD-10-CM | POA: Diagnosis not present

## 2023-07-12 DIAGNOSIS — I252 Old myocardial infarction: Secondary | ICD-10-CM | POA: Diagnosis not present

## 2023-07-12 DIAGNOSIS — I5023 Acute on chronic systolic (congestive) heart failure: Secondary | ICD-10-CM | POA: Diagnosis present

## 2023-07-12 DIAGNOSIS — Z515 Encounter for palliative care: Secondary | ICD-10-CM

## 2023-07-12 DIAGNOSIS — I5084 End stage heart failure: Secondary | ICD-10-CM | POA: Diagnosis not present

## 2023-07-12 DIAGNOSIS — N1831 Chronic kidney disease, stage 3a: Secondary | ICD-10-CM

## 2023-07-12 DIAGNOSIS — I351 Nonrheumatic aortic (valve) insufficiency: Secondary | ICD-10-CM | POA: Diagnosis present

## 2023-07-12 DIAGNOSIS — E162 Hypoglycemia, unspecified: Secondary | ICD-10-CM | POA: Diagnosis present

## 2023-07-12 DIAGNOSIS — R0902 Hypoxemia: Principal | ICD-10-CM

## 2023-07-12 DIAGNOSIS — Z711 Person with feared health complaint in whom no diagnosis is made: Secondary | ICD-10-CM

## 2023-07-12 DIAGNOSIS — Z955 Presence of coronary angioplasty implant and graft: Secondary | ICD-10-CM | POA: Diagnosis not present

## 2023-07-12 DIAGNOSIS — I480 Paroxysmal atrial fibrillation: Secondary | ICD-10-CM | POA: Diagnosis present

## 2023-07-12 DIAGNOSIS — Z66 Do not resuscitate: Secondary | ICD-10-CM | POA: Diagnosis not present

## 2023-07-12 DIAGNOSIS — Z789 Other specified health status: Secondary | ICD-10-CM | POA: Diagnosis not present

## 2023-07-12 DIAGNOSIS — I700 Atherosclerosis of aorta: Secondary | ICD-10-CM | POA: Diagnosis not present

## 2023-07-12 DIAGNOSIS — R062 Wheezing: Secondary | ICD-10-CM

## 2023-07-12 DIAGNOSIS — N183 Chronic kidney disease, stage 3 unspecified: Secondary | ICD-10-CM | POA: Diagnosis present

## 2023-07-12 DIAGNOSIS — R0603 Acute respiratory distress: Secondary | ICD-10-CM | POA: Diagnosis not present

## 2023-07-12 DIAGNOSIS — E785 Hyperlipidemia, unspecified: Secondary | ICD-10-CM | POA: Diagnosis not present

## 2023-07-12 DIAGNOSIS — J9691 Respiratory failure, unspecified with hypoxia: Secondary | ICD-10-CM | POA: Diagnosis not present

## 2023-07-12 DIAGNOSIS — I4821 Permanent atrial fibrillation: Secondary | ICD-10-CM

## 2023-07-12 DIAGNOSIS — M129 Arthropathy, unspecified: Secondary | ICD-10-CM | POA: Diagnosis not present

## 2023-07-12 DIAGNOSIS — Z9981 Dependence on supplemental oxygen: Secondary | ICD-10-CM | POA: Diagnosis not present

## 2023-07-12 DIAGNOSIS — E039 Hypothyroidism, unspecified: Secondary | ICD-10-CM | POA: Diagnosis not present

## 2023-07-12 DIAGNOSIS — I420 Dilated cardiomyopathy: Secondary | ICD-10-CM | POA: Diagnosis present

## 2023-07-12 DIAGNOSIS — J9600 Acute respiratory failure, unspecified whether with hypoxia or hypercapnia: Secondary | ICD-10-CM | POA: Diagnosis not present

## 2023-07-12 HISTORY — DX: Unspecified atrial fibrillation: I48.91

## 2023-07-12 LAB — CBC
HCT: 38.5 % (ref 36.0–46.0)
Hemoglobin: 12.1 g/dL (ref 12.0–15.0)
MCH: 30.5 pg (ref 26.0–34.0)
MCHC: 31.4 g/dL (ref 30.0–36.0)
MCV: 97 fL (ref 80.0–100.0)
Platelets: 92 10*3/uL — ABNORMAL LOW (ref 150–400)
RBC: 3.97 MIL/uL (ref 3.87–5.11)
RDW: 16 % — ABNORMAL HIGH (ref 11.5–15.5)
WBC: 5.3 10*3/uL (ref 4.0–10.5)
nRBC: 0 % (ref 0.0–0.2)

## 2023-07-12 LAB — COMPREHENSIVE METABOLIC PANEL WITH GFR
ALT: 29 U/L (ref 0–44)
AST: 62 U/L — ABNORMAL HIGH (ref 15–41)
Albumin: 3.2 g/dL — ABNORMAL LOW (ref 3.5–5.0)
Alkaline Phosphatase: 90 U/L (ref 38–126)
Anion gap: 13 (ref 5–15)
BUN: 29 mg/dL — ABNORMAL HIGH (ref 8–23)
CO2: 27 mmol/L (ref 22–32)
Calcium: 8.8 mg/dL — ABNORMAL LOW (ref 8.9–10.3)
Chloride: 99 mmol/L (ref 98–111)
Creatinine, Ser: 1.26 mg/dL — ABNORMAL HIGH (ref 0.44–1.00)
GFR, Estimated: 38 mL/min — ABNORMAL LOW (ref >60)
Glucose, Bld: 278 mg/dL — ABNORMAL HIGH (ref 70–99)
Potassium: 3.9 mmol/L (ref 3.5–5.1)
Sodium: 139 mmol/L (ref 135–145)
Total Bilirubin: 1.1 mg/dL (ref 0.0–1.2)
Total Protein: 6.6 g/dL (ref 6.5–8.1)

## 2023-07-12 LAB — RESPIRATORY PANEL BY PCR

## 2023-07-12 LAB — BRAIN NATRIURETIC PEPTIDE: B Natriuretic Peptide: 613.3 pg/mL — ABNORMAL HIGH (ref 0.0–100.0)

## 2023-07-12 LAB — I-STAT VENOUS BLOOD GAS, ED
Acid-Base Excess: 5 mmol/L — ABNORMAL HIGH (ref 0.0–2.0)
Bicarbonate: 31.6 mmol/L — ABNORMAL HIGH (ref 20.0–28.0)
Calcium, Ion: 1.14 mmol/L — ABNORMAL LOW (ref 1.15–1.40)
HCT: 34 % — ABNORMAL LOW (ref 36.0–46.0)
Hemoglobin: 11.6 g/dL — ABNORMAL LOW (ref 12.0–15.0)
O2 Saturation: 97 %
Potassium: 3.6 mmol/L (ref 3.5–5.1)
Sodium: 141 mmol/L (ref 135–145)
TCO2: 33 mmol/L — ABNORMAL HIGH (ref 22–32)
pCO2, Ven: 57.7 mmHg (ref 44–60)
pH, Ven: 7.346 (ref 7.25–7.43)
pO2, Ven: 104 mmHg — ABNORMAL HIGH (ref 32–45)

## 2023-07-12 LAB — CBG MONITORING, ED
Glucose-Capillary: 225 mg/dL — ABNORMAL HIGH (ref 70–99)
Glucose-Capillary: 112 mg/dL — ABNORMAL HIGH (ref 70–99)

## 2023-07-12 LAB — BLOOD GAS, VENOUS
Acid-Base Excess: 6.8 mmol/L — ABNORMAL HIGH (ref 0.0–2.0)
Bicarbonate: 36.7 mmol/L — ABNORMAL HIGH (ref 20.0–28.0)
O2 Saturation: 56.3 %
Patient temperature: 37.1
pCO2, Ven: 80 mmHg (ref 44–60)
pH, Ven: 7.27 (ref 7.25–7.43)
pO2, Ven: 37 mmHg (ref 32–45)

## 2023-07-12 LAB — GLUCOSE, CAPILLARY: Glucose-Capillary: 148 mg/dL — ABNORMAL HIGH (ref 70–99)

## 2023-07-12 LAB — TROPONIN I (HIGH SENSITIVITY)
Troponin I (High Sensitivity): 22 ng/L — ABNORMAL HIGH (ref <18)
Troponin I (High Sensitivity): 27 ng/L — ABNORMAL HIGH (ref <18)

## 2023-07-12 LAB — PROCALCITONIN: Procalcitonin: <0.1 ng/mL

## 2023-07-12 MED ORDER — SODIUM CHLORIDE 0.9 % IV SOLN
500.0000 mg | Freq: Once | INTRAVENOUS | Status: AC
  Administered 2023-07-12: 500 mg via INTRAVENOUS
  Filled 2023-07-12: qty 5

## 2023-07-12 MED ORDER — ACETAMINOPHEN 650 MG RE SUPP: 650.0000 mg | Freq: Four times a day (QID) | RECTAL | Status: DC | PRN

## 2023-07-12 MED ORDER — FUROSEMIDE 10 MG/ML IJ SOLN
40.0000 mg | INTRAMUSCULAR | Status: AC
  Administered 2023-07-12: 40 mg via INTRAVENOUS
  Filled 2023-07-12: qty 4

## 2023-07-12 MED ORDER — ALBUTEROL SULFATE (2.5 MG/3ML) 0.083% IN NEBU
5.0000 mg | INHALATION_SOLUTION | Freq: Once | RESPIRATORY_TRACT | Status: AC
  Administered 2023-07-12: 5 mg via RESPIRATORY_TRACT
  Filled 2023-07-12: qty 6

## 2023-07-12 MED ORDER — SODIUM CHLORIDE 0.9 % IV SOLN: 1.0000 g | INTRAVENOUS | Status: DC

## 2023-07-12 MED ORDER — SODIUM CHLORIDE 0.9 % IV SOLN
100.0000 mg | Freq: Two times a day (BID) | INTRAVENOUS | Status: DC
  Administered 2023-07-13: 100 mg via INTRAVENOUS
  Filled 2023-07-12 (×2): qty 100

## 2023-07-12 MED ORDER — PREDNISONE 5 MG PO TABS: 50.0000 mg | ORAL_TABLET | Freq: Every day | ORAL | Status: DC

## 2023-07-12 MED ORDER — POLYETHYLENE GLYCOL 3350 17 G PO PACK
17.0000 g | PACK | Freq: Every day | ORAL | Status: DC | PRN
  Administered 2023-07-18: 17 g via ORAL
  Filled 2023-07-12: qty 1

## 2023-07-12 MED ORDER — METOPROLOL TARTRATE 25 MG PO TABS
25.0000 mg | ORAL_TABLET | Freq: Two times a day (BID) | ORAL | Status: DC
Start: 2023-07-12 — End: 2023-07-16
  Administered 2023-07-13 – 2023-07-15 (×6): 25 mg via ORAL
  Filled 2023-07-12 (×7): qty 1

## 2023-07-12 MED ORDER — SODIUM CHLORIDE 0.9 % IV SOLN
2.0000 g | Freq: Once | INTRAVENOUS | Status: AC
  Administered 2023-07-12: 2 g via INTRAVENOUS
  Filled 2023-07-12: qty 20

## 2023-07-12 MED ORDER — FUROSEMIDE 40 MG PO TABS: 40.0000 mg | ORAL_TABLET | Freq: Two times a day (BID) | ORAL | Status: DC

## 2023-07-12 MED ORDER — HEPARIN SODIUM (PORCINE) 5000 UNIT/ML IJ SOLN
5000.0000 [IU] | Freq: Three times a day (TID) | INTRAMUSCULAR | Status: DC
  Administered 2023-07-12 – 2023-07-19 (×21): 5000 [IU] via SUBCUTANEOUS
  Filled 2023-07-12 (×22): qty 1

## 2023-07-12 MED ORDER — METHYLPREDNISOLONE SODIUM SUCC 125 MG IJ SOLR
125.0000 mg | Freq: Once | INTRAMUSCULAR | Status: AC
  Administered 2023-07-12: 125 mg via INTRAVENOUS
  Filled 2023-07-12: qty 2

## 2023-07-12 MED ORDER — ALBUTEROL SULFATE (2.5 MG/3ML) 0.083% IN NEBU
10.0000 mg | INHALATION_SOLUTION | Freq: Once | RESPIRATORY_TRACT | Status: AC
  Administered 2023-07-12: 10 mg via RESPIRATORY_TRACT
  Filled 2023-07-12: qty 12

## 2023-07-12 MED ORDER — ACETAMINOPHEN 325 MG PO TABS: 650.0000 mg | ORAL_TABLET | Freq: Four times a day (QID) | ORAL | Status: DC | PRN

## 2023-07-12 MED ORDER — PANTOPRAZOLE SODIUM 40 MG PO TBEC
40.0000 mg | DELAYED_RELEASE_TABLET | Freq: Every day | ORAL | Status: DC
  Filled 2023-07-12: qty 1

## 2023-07-12 MED ORDER — LEVOTHYROXINE SODIUM 25 MCG PO TABS
25.0000 ug | ORAL_TABLET | Freq: Every day | ORAL | Status: DC
  Administered 2023-07-14 – 2023-07-20 (×7): 25 ug via ORAL
  Filled 2023-07-12 (×7): qty 1

## 2023-07-12 MED ORDER — IPRATROPIUM BROMIDE 0.02 % IN SOLN
0.5000 mg | Freq: Once | RESPIRATORY_TRACT | Status: AC
  Administered 2023-07-12: 0.5 mg via RESPIRATORY_TRACT
  Filled 2023-07-12: qty 2.5

## 2023-07-12 MED ORDER — IPRATROPIUM-ALBUTEROL 0.5-2.5 (3) MG/3ML IN SOLN
3.0000 mL | RESPIRATORY_TRACT | Status: DC | PRN
  Administered 2023-07-14 – 2023-07-20 (×8): 3 mL via RESPIRATORY_TRACT
  Filled 2023-07-12 (×9): qty 3

## 2023-07-12 MED ORDER — ATORVASTATIN CALCIUM 40 MG PO TABS
40.0000 mg | ORAL_TABLET | Freq: Every evening | ORAL | Status: DC
  Administered 2023-07-13 – 2023-07-19 (×7): 40 mg via ORAL
  Filled 2023-07-12 (×7): qty 1

## 2023-07-12 MED ORDER — SODIUM CHLORIDE 0.9% FLUSH
3.0000 mL | Freq: Two times a day (BID) | INTRAVENOUS | Status: DC
  Administered 2023-07-13 – 2023-07-20 (×16): 3 mL via INTRAVENOUS

## 2023-07-12 MED ORDER — ASPIRIN 81 MG PO TBEC
81.0000 mg | DELAYED_RELEASE_TABLET | Freq: Every evening | ORAL | Status: DC
  Filled 2023-07-12: qty 1

## 2023-07-12 NOTE — Care Plan (Addendum)
 Brief Palliative Medicine Progress Note:  PMT consult received and chart reviewed.   GOC completed daughter/[name], granddaughter/[name], granddaughter/[name], great-granddaughter/[name]. Full note to follow.  Recommendations/Plan Continue full work up and treat the treatable. In the event of decline, family open to continued discussions regarding transition to full comfort vs ICU admission Now DNR/DNI Family will make stepwise decisions pending patient's clinical course If goals change to full comfort path, family are most interested in getting patient back home with hospice if stable for transfer PMT will continue to follow and support holistically   Thank you for allowing PMT to assist in the care of this patient.  Darald Uzzle M. Katrinka Blazing [facility] Palliative Medicine Team Team Phone: [phone] NO CHARGE

## 2023-07-12 NOTE — H&P (Addendum)
 History and Physical   Julie Lara:[id number] DOB: 1920/06/12 DOA: 07/12/2023  PCP: Geoffry Paradise, MD   Patient coming from: Home  Chief Complaint: Shortness of breath  HPI: Julie Lara is a 89 y.o. female with medical history significant of hypertension, hyperlipidemia, hypothyroidism, CKD 3, chronic systolic CHF, CAD, atrial fibrillation, anemia presenting with worsening shortness of breath.  Patient has had around 5 days of shortness of breath.  She was seen in the ED on 4/2:07 days of shortness of breath and had a reassuring workup and was discharged home. Afterwards she was seen by her PCP and has been prescribed cefdinir and prednisone which she has completed 2 days.  For the past couple days she has noted continued worsening shortness of breath.  EMS was called and patient noted to have low blood sugar and she was received D10 with significant improvement in blood sugars.  Wheezing on exam.  Patient significantly short of breath and on nonrebreather.  Unable to provide full review of systems but no further complaints.  ED Course: Vital signs in the ED notable for temperature of 96.9, blood pressure 100s-150 systolic, respiratory rate 20s, requiring 2-4 L to maintain saturations.  Lab workup included CMP with BUN 29, creatinine stable 1.96, glucose 278, calcium 8.8, albumin 3.2, AST 62.  CBC with platelets stable at 92.  BNP pending.  Troponin 22 with repeat pending.  Blood cultures pending.  Chest x-ray with cardiomegaly but no acute abnormality.  VBG with normal pH and normal pCO2.  Patient received ceftriaxone, azithromycin, Solu-Medrol, Atrovent, albuterol x 2, Lasix in the ED.  Palliative medicine team also consulted to discuss goals of cares, etc.  Review of Systems: Able to light full review of systems.  Past Medical History:  Diagnosis Date   Acute blood loss anemia    November 2014   Aortic insufficiency    mild to moderate   Arthritis    "right upper arm"  (02/01/2017)   Atrial fibrillation (HCC)    Atrial fibrillation with RVR (HCC)    CAD (coronary artery disease)    Chest pain 04/2010   atypical with negative echocardiogram   Dilated cardiomyopathy (HCC)    Goiter    Hypothyroidism    Idiopathic cardiomyopathy (HCC) 1999   EF 40% by Echo on 10/13/12   Nonspecific abnormal finding in stool contents 02/23/2013   NSTEMI (non-ST elevated myocardial infarction) (HCC) 09/2012   95% prox RCA s/p BMS   PAC (premature atrial contraction)    Chronic   UTI (urinary tract infection) 10/15/2012    Past Surgical History:  Procedure Laterality Date   biopsy of sacral lesion  2010   CARDIAC CATHETERIZATION     COLONOSCOPY N/A 02/23/2013   Procedure: COLONOSCOPY;  Surgeon: Meryl Dare, MD;  Location: [facility] ENDOSCOPY;  Service: Endoscopy;  Laterality: N/A;   CORONARY ANGIOPLASTY WITH STENT PLACEMENT  09/2012   BMS-95% prox RCA   ESOPHAGOGASTRODUODENOSCOPY N/A 02/22/2013   Procedure: ESOPHAGOGASTRODUODENOSCOPY (EGD);  Surgeon: Rachael Fee, MD;  Location: [facility] ENDOSCOPY;  Service: Endoscopy;  Laterality: N/A;   LEFT HEART CATHETERIZATION WITH CORONARY ANGIOGRAM N/A 10/14/2012   Procedure: LEFT HEART CATHETERIZATION WITH CORONARY ANGIOGRAM;  Surgeon: Iran Ouch, MD;  Location: [facility] CATH LAB;  Service: Cardiovascular;  Laterality: N/A;   PERCUTANEOUS CORONARY STENT INTERVENTION (PCI-S) Right 10/14/2012   Procedure: PERCUTANEOUS CORONARY STENT INTERVENTION (PCI-S);  Surgeon: Iran Ouch, MD;  Location: [facility] CATH LAB;  Service: Cardiovascular;  Laterality: Right;  THYROIDECTOMY  2008   VAGINAL HYSTERECTOMY     Complete    Social History  reports that she has never smoked. She has never used smokeless tobacco. She reports that she does not drink alcohol and does not use drugs.  No Known Allergies  Family History  Problem Relation Age of Onset   Diabetes Sister   Reviewed on admission  Prior to Admission medications   Medication Sig  Start Date End Date Taking? Authorizing Provider  albuterol (VENTOLIN HFA) 108 (90 Base) MCG/ACT inhaler Inhale 2 puffs into the lungs every 6 (six) hours as needed for wheezing or shortness of breath.   Yes [provider]  aspirin EC 81 MG tablet Take 81 mg by mouth every evening.   Yes [provider]  atorvastatin (LIPITOR) 40 MG tablet TAKE 1 TABLET BY MOUTH DAILY AT 6 PM. Patient taking differently: Take 40 mg by mouth every evening. 12/13/22  Yes [place], Peter M, MD  b complex vitamins capsule Take 1 capsule by mouth daily.   Yes [provider]  cefdinir (OMNICEF) 300 MG capsule Take 300 mg by mouth 2 (two) times daily.   Yes [provider]  Cyanocobalamin (VITAMIN B-12 PO) Take 1 tablet by mouth daily with lunch.   Yes [provider]  Ferrous Sulfate (IRON PO) Take 1 tablet by mouth daily with lunch.   Yes [provider]  furosemide (LASIX) 40 MG tablet TAKE 1 TABLET BY MOUTH TWICE A DAY Patient taking differently: Take 40 mg by mouth 2 (two) times daily with breakfast and lunch. 09/18/22  Yes [place], Peter M, MD  lactose free nutrition (BOOST) LIQD Take 237 mLs by mouth 3 (three) times daily as needed (low appetite). Chocolate flavor preferred.   Yes [provider]  levothyroxine (SYNTHROID, LEVOTHROID) 25 MCG tablet Take 25 mcg by mouth daily.   Yes [provider]  metoprolol tartrate (LOPRESSOR) 25 MG tablet TAKE 1 TABLET BY MOUTH TWICE A DAY 09/18/22  Yes [place], Peter M, MD  Multiple Vitamins-Minerals (WOMENS MULTIVITAMIN GUMMIES) CHEW Chew 1 each by mouth daily with lunch.   Yes [provider]  nitroGLYCERIN (NITROSTAT) 0.4 MG SL tablet Place 1 tablet (0.4 mg total) under the tongue every 5 (five) minutes as needed for chest pain. 06/28/21  Yes [place], Peter M, MD  pantoprazole (PROTONIX) 40 MG tablet Take 1 tablet (40 mg total) by mouth daily at 6 (six) AM. Patient taking differently: Take 40 mg by  mouth daily. 03/09/17  Yes Regalado, Belkys A, MD  predniSONE (DELTASONE) 10 MG tablet Take 10 mg by mouth daily with breakfast.   Yes [provider]    Physical Exam: Vitals:   07/12/23 1315 07/12/23 1345 07/12/23 1437 07/12/23 1600  BP: (!) 100/57 (!) 106/53 (!) 141/59 104/65  Pulse: 75 79 79 (!) 52  Resp: (!) 26 (!) 23 (!) 28 (!) 21  Temp:      TempSrc:      SpO2: 93% 95% 92% (!) 88%  Weight:      Height:        Physical Exam Constitutional:      General: She is not in acute distress.    Appearance: Normal appearance.  HENT:     Head: Normocephalic and atraumatic.     Mouth/Throat:     Mouth: Mucous membranes are moist.     Pharynx: Oropharynx is clear.  Eyes:     Extraocular Movements: Extraocular movements intact.  Pupils: Pupils are equal, round, and reactive to light.  Neck:     Vascular: JVD present.  Cardiovascular:     Rate and Rhythm: Regular rhythm. Tachycardia present.     Pulses: Normal pulses.     Heart sounds: Normal heart sounds.  Pulmonary:     Effort: Pulmonary effort is normal. No respiratory distress.     Breath sounds: Wheezing and rhonchi present.  Abdominal:     General: Bowel sounds are normal. There is no distension.     Palpations: Abdomen is soft.     Tenderness: There is no abdominal tenderness.  Musculoskeletal:        General: No swelling or deformity.     Right lower leg: Edema (minimal) present.     Left lower leg: Edema (minimal) present.  Skin:    General: Skin is warm and dry.  Neurological:     General: No focal deficit present.     Mental Status: Mental status is at baseline.    Labs on Admission: I have personally reviewed following labs and imaging studies  CBC: Recent Labs  Lab 07/08/23 2032 07/12/23 1220 07/12/23 1511  WBC 4.5 5.3  --   NEUTROABS 1.4*  --   --   HGB 11.8* 12.1 11.6*  HCT 38.1 38.5 34.0*  MCV 97.7 97.0  --   PLT 92* 92*  --     Basic Metabolic Panel: Recent Labs  Lab  07/08/23 2032 07/12/23 1220 07/12/23 1511  NA 142 139 141  K 3.6 3.9 3.6  CL 100 99  --   CO2 29 27  --   GLUCOSE 120* 278*  --   BUN 22 29*  --   CREATININE 1.32* 1.26*  --   CALCIUM 9.2 8.8*  --     GFR: Estimated Creatinine Clearance: 19.7 mL/min (A) (by C-G formula based on SCr of 1.26 mg/dL (H)).  Liver Function Tests: Recent Labs  Lab 07/08/23 2032 07/12/23 1220  AST 33 62*  ALT 22 29  ALKPHOS 96 90  BILITOT 1.0 1.1  PROT 6.9 6.6  ALBUMIN 3.5 3.2*    Urine analysis:    Component Value Date/Time   COLORURINE YELLOW 10/14/2012 2010   APPEARANCEUR HAZY (A) 10/14/2012 2010   LABSPEC 1.017 10/14/2012 2010   PHURINE 5.5 10/14/2012 2010   GLUCOSEU NEGATIVE 10/14/2012 2010   HGBUR TRACE (A) 10/14/2012 2010   BILIRUBINUR NEGATIVE 10/14/2012 2010   KETONESUR NEGATIVE 10/14/2012 2010   PROTEINUR NEGATIVE 10/14/2012 2010   UROBILINOGEN 0.2 10/14/2012 2010   NITRITE POSITIVE (A) 10/14/2012 2010   LEUKOCYTESUR MODERATE (A) 10/14/2012 2010    Radiological Exams on Admission: DG Chest Port 1 View Result Date: 07/12/2023 CLINICAL DATA:  Shortness of breath EXAM: PORTABLE CHEST 1 VIEW COMPARISON:  Chest radiograph dated 07/08/2023 FINDINGS: Normal lung volumes. No focal consolidations. No pleural effusion or pneumothorax. Similar markedly enlarged cardiomediastinal silhouette. No acute osseous abnormality. IMPRESSION: 1. Similar marked cardiomegaly. 2. No focal consolidations. Electronically Signed   By: Agustin Cree M.D.   On: 07/12/2023 13:48   EKG: Independently reviewed.  Atrial fibrillation at 98 bpm.  PVCs noted.  Right bundle branch block.  QTc 500.  Assessment/Plan Principal Problem:   Acute respiratory failure with hypoxia (HCC) Active Problems:   Hypothyroidism   Atrial fibrillation (HCC)   CKD (chronic kidney disease), stage III (HCC)   Anemia   Chronic systolic congestive heart failure (HCC)   CAD (coronary artery disease)   HLD (hyperlipidemia)  Essential hypertension   Acute respiratory failure with hypoxia > Patient presenting with 5 days of worsening shortness of breath.  Initially seen in the ED on 4/7 with reassuring workup and was discharged.  Saw PCP in follow-up the next day and was prescribed cefdinir and prednisone. > Continued worsening shortness of breath despite cefdinir and prednisone. > Wheezing on exam by EMS and in the ED.  Initially hypoglycemic as well with improved as below. > Chest x-ray with cardiomegaly and no acute abnormality.  No leukocytosis.  Recent flu COVID and RSV test negative.  Troponin 22 with repeat pending.  BNP pending and was stable recently. > Concern for bacterial versus viral bronchitis versus early pneumonia. > Received ceftriaxone and azithromycin in the ED.  Also received Solu-Medrol, Atrovent, albuterol, Lasix. - Monitor in progressive unit overnight - Continue with supplemental oxygen, wean as tolerated - Continue ceftriaxone - Change azithromycin to doxycycline considering prolonged QTc - Continue steroids - As needed DuoNeb - Trend fever curve and WBC - Check RVP - Procalcitonin - Follow-up with BNP, repeat troponin - Follow-up blood cultures - Supportive care ADDENDUM > Increasing oxygen demand 4 L, 6 L, placed on a mask. > Increased BNP from baseline as below, strain versus exacerbation.  Has received Lasix. - Placed on high flow nasal cannula, wean down to see if patient now tolerates 6 L or not - Respiratory therapy consult: Spoke with night PT, will keep eye on her - CT chest to further evaluate symptoms.  Gradual onset and low risk for PE with creatinine clearance of 19.7 so we will avoid contrasted study at this time.   - Repeat VBG, family okay with BiPAP if needed.  - If worsening pCO2 or hypoxia will trial BiPAP  Hypoglycemia > Noted to have some hypoglycemia by EMS.  Responded to D10 now hyperglycemic. - Trend CBGs   Prolonged QTc > QTc 500 on EKG.   - Avoid QTc  prolonging medications - A.m. EKG.  Hypertension - Continue home Lasix, metoprolol  Hyperlipidemia - Continue home atorvastatin  Hypothyroidism - Continue home Synthroid  CKD 3 > Creatinine stable 1.26 - Trend renal function and electrolytes  Chronic systolic CHF > Unclear if this is contributing no obvious edema on imaging or exam.  BNP pending. > Last echo was in 2018 with EF 35-40%, indeterminate diastolic function, moderately reduced RV function, moderate AR, moderate MR. - Continue home Lasix, did receive IV dose in the ED - Continue metoprolol Addendum ?  CHF exacerbation > BNP now elevated greater than 600 which is higher than baseline.  Possibly secondary to strain. Though some ?JVD noted. > Received IV Lasix in the ED, blood pressure is not low normal so hold off on further dose for now. - Reasonable to get repeat echocardiogram, especially in the setting of worsening hypoxia as above. - Strict I's and O's, daily weights - Check magnesium  CAD > Status post PCI per chart - Continue home ASA, metoprolol, atorvastatin  Atrial fibrillation > Currently sinus rhythm. - Continue home metoprolol - Not on any anticoagulation  Thrombocytopenia > Stable at 92 - Trend CBC   DVT prophylaxis: Heparin Code Status:   DNR/DNI, otherwise full scope Family Communication:  Updated at bedside Disposition Plan:   Patient is from:  Home  Anticipated DC to:  Home  Anticipated DC date:  1 to 5 days  Anticipated DC barriers: None  Consults called:  Palliative medicine Admission status:  Observation, progressive  Severity of Illness: The  appropriate patient status for this patient is OBSERVATION. Observation status is judged to be reasonable and necessary in order to provide the required intensity of service to ensure the patient's safety. The patient's presenting symptoms, physical exam findings, and initial radiographic and laboratory data in the context of their medical  condition is felt to place them at decreased risk for further clinical deterioration. Furthermore, it is anticipated that the patient will be medically stable for discharge from the hospital within 2 midnights of admission.    Synetta Fail MD [facility]  How to contact the [facility] Attending or Consulting provider 7A - 7P or covering provider during after hours 7P -7A, for this patient?   Check the care team in [facility] and look for a) attending/consulting [facility] provider listed and b) the [facility] team listed Log into [email or web address] and use 's universal password to access. If you do not have the password, please contact the hospital operator. Locate the [facility] provider you are looking for under [facility] and page to a number that you can be directly reached. If you still have difficulty reaching the provider, please page the [facility] (Director on Call) for the Hospitalists listed on amion for assistance.  07/12/2023, 4:10 PM

## 2023-07-12 NOTE — ED Notes (Addendum)
 Sats noted to be 86-88% on 3L; pt mouth breathing, opens eyes briefly when directed, otherwise minimally responive; 02 increasedto 6L, sats remain in the 80s; pt placed on NRB 15L, sats 100%; Dr. Silverio Lay notified

## 2023-07-12 NOTE — Progress Notes (Addendum)
 RN called d/t pt on Venturi mask, order for HFNC trial. Pt placed on 10 lpm hfnc, sat 99-100%, no distress noted, pt appears comfortable.  Noted shallow breathing, discussed w/ RN if bipap is an option.    1903-RN called-MD is at bedside.  Night shift RT is aware and going to speak w/ MD now.

## 2023-07-12 NOTE — ED Triage Notes (Signed)
 Patient presents to ED via [facility] states she was seen 3 days ago in ED and was given Antibiotics and prednisone c/o increased weakness since going home , decreased  appetite, not her normal per family  . Patient normally used a walker, Patient was given a duo neb enroute per ems. Blood sugar just read low for ems was given 25 g of D10 blood sugar came up to 436. Upon arrival patient is alert oriented denies pain

## 2023-07-12 NOTE — Consult Note (Signed)
 Consultation Note Date: 07/12/2023   Patient Name: Julie Lara  DOB: [birth year]  MRN: [id number]  Age / Sex: 89 y.o., female  PCP: Suan Elm, MD Referring Physician: Ninetta Basket, MD  Reason for Consultation: Establishing goals of care  HPI/Patient Profile: 89 y.o. female  with past medical history of hypertension, hyperlipidemia, hypothyroidism, CKD 3, chronic systolic CHF, CAD, atrial fibrillation, anemia presented to the ED on 07/12/2023 with complaints of shortness of breath. She was recently seen in ED on 07/03/23 for same - work up was unremarkable and she was sent home.    Clinical Assessment and Goals of Care: I have reviewed medical records including EPIC notes, labs, any available advanced directives, and imaging. Received report from primary RN - no acute concerns.    Went to visit patient at bedside -  daughter/[name], granddaughter/[name], granddaughter/[name], great-granddaughter/[name]  present. Patient was lying in bed - she is lethargic and unable to participate in meaningful conversation. No signs or non-verbal gestures of pain or discomfort noted. No respiratory distress, increased work of breathing, or secretions noted. She is on 4L O2 [place].  As visit progressed, patient experienced respiratory decline - worsening wheezing eventually audible without stethoscope despite the administration of breathing treatment. ED provider was notified.  Met with family  to discuss diagnosis, prognosis, GOC, EOL wishes, disposition, and options.  I introduced Palliative Medicine as specialized medical care for people living with serious illness. It focuses on providing relief from the symptoms and stress of a serious illness. The goal is to improve quality of life for both the patient and the family.  We discussed a brief life review of the patient as well as functional and nutritional status.  Patient has three children. Prior to hospitalization, she was living in a private home with her daughter/[name]. At home, patient was able to ambulate short distances with a walker as well as dress/bathe herself with assistance. She does not have any HH care services - family provide all care.   We discussed patient's current illness and what it means in the larger context of patient's on-going co-morbidities.  At this time, family and medical team are waiting for results of labs and imaging for more insight into her current clinical status. We discussed results may find something treatable vs education offered on the limitations of medical interventions to prolong quality of life when the body fails to thrive. Natural disease trajectory and expectations at EOL were discussed. I attempted to elicit values and goals of care important to the patient. The difference between aggressive medical intervention and comfort care was considered in light of the patient's goals of care. We discussed in detail the difference between limited interventions vs full scope ICU level care in the event of further decline. Allowed space and time for family to discuss - at this time, family would like to continue limited interventions. They are not sure if they would want to escalate patient's care to ICU (they might to allow time for other family to visit). Family will make stepwise decisions pending patient's clinical course. In the event of further decline, family are open to continued discussions regarding transition to full comfort vs ICU admission.  Encouraged family to consider DNR/DNI status understanding evidenced based poor outcomes in similar hospitalized patient, as the cause of arrest is likely associated with advanced chronic/terminal illness rather than an easily reversible acute cardio-pulmonary event.  I shared that even if we pursued resuscitation we would not able to resolve the underlying factors.  I explained that  DNR/DNI does not change the medical plan and it only comes into effect after a person has arrested (died).  It is a protective measure to keep us  from harming the patient in their last moments of life. Family opt for code status change to DNR/DNI with understanding that she would not receive CPR, defibrillation, ACLS medications, or intubation.   Per family request, provided education and counseling at length on the philosophy and benefits of hospice care. Discussed that it offers a holistic approach to care in the setting of end-stage illness, and is about supporting the patient where they are allowing nature to take it's course. Discussed the hospice team includes RNs, physicians, social workers, and chaplains. They can provide personal care, support for the family, and help keep patient out of the hospital as well as assist with DME needs for home hospice. Education provided on the difference between home vs residential hospice. If patient declines and family opt to pursue comfort path, they would be most interested in her discharge home with hospice, not a hospice facility.  Patient does not have a Living Will or HCPOA.  Chaplain support offered - family kindly decline.  Discussed with family the importance of continued conversation with each other and the medical providers regarding overall plan of care and treatment options, ensuring decisions are within the context of the patient's values and GOCs.    Questions and concerns were addressed. The patient/family was encouraged to call with questions and/or concerns. PMT card was provided.   Primary Decision Maker: NEXT OF KIN - daughter/[name] [place] with assistance from other family    SUMMARY OF RECOMMENDATIONS   Continue full work up and treat the treatable. In the event of decline, family open to continued discussions regarding transition to full comfort vs ICU admission Now DNR/DNI Family will make stepwise decisions pending patient's  clinical course If goals change to full comfort path, family are most interested in getting patient back home with hospice if stable for transfer PMT will continue to follow and support holistically   Code Status/Advance Care Planning: DNR  Palliative Prophylaxis:  Aspiration, Delirium Protocol, Frequent Pain Assessment, Oral Care, and Turn Reposition  Additional Recommendations (Limitations, Scope, Preferences): Full Scope Treatment  Psycho-social/Spiritual:  Desire for further Chaplaincy support:no Created space and opportunity for patient and family to express thoughts and feelings regarding patient's current medical situation.  Emotional support and therapeutic listening provided.  Prognosis:  Unable to determine  Discharge Planning: To Be Determined      Primary Diagnoses: Present on Admission: **None**   I have reviewed the medical record, interviewed the patient and family, and examined the patient. The following aspects are pertinent.  Past Medical History:  Diagnosis Date   Acute blood loss anemia    November 2014   Aortic insufficiency    mild to moderate   Arthritis    "right upper arm" (02/01/2017)   Atrial fibrillation (HCC)    CAD (coronary artery disease)    Chest pain 04-2010   atypical with negative echocardiogram   Dilated cardiomyopathy (HCC)    Goiter    Hypothyroidism    Idiopathic cardiomyopathy (HCC) 1999   EF 40% by Echo on 10/13/12   NSTEMI (non-ST elevated myocardial infarction) [facility]) July 2014   95% prox RCA s/p BMS   PAC (premature atrial contraction)    Chronic   Social History   Socioeconomic History   Marital status: Widowed    Spouse name: Not on file  Number of children: Not on file   Years of education: Not on file   Highest education level: Not on file  Occupational History   Occupation: Retired    Associate Professor: [employer]  Tobacco Use   Smoking status: Never   Smokeless tobacco: Never  Vaping Use   Vaping  status: Never Used  Substance and Sexual Activity   Alcohol use: No   Drug use: No   Sexual activity: Not Currently  Other Topics Concern   Not on file  Social History Narrative   Pt lives alone, but has family close by.   Social Drivers of Corporate investment banker Strain: Not on file  Food Insecurity: Not on file  Transportation Needs: Not on file  Physical Activity: Not on file  Stress: Not on file  Social Connections: Not on file   Family History  Problem Relation Age of Onset   Diabetes Sister    Scheduled Meds: Continuous Infusions: PRN Meds:. Medications Prior to Admission:  Prior to Admission medications   Medication Sig Start Date End Date Taking? Authorizing Provider  aspirin EC 81 MG tablet Take 81 mg by mouth daily.    [provider]  atorvastatin (LIPITOR) 40 MG tablet TAKE 1 TABLET BY MOUTH DAILY AT 6 PM. 12/13/22   [place], Peter M, MD  Cyanocobalamin (VITAMIN B-12 PO) Take 1 tablet by mouth daily.    [provider]  furosemide (LASIX) 40 MG tablet TAKE 1 TABLET BY MOUTH TWICE A DAY 09/18/22   [place], Peter M, MD  iron polysaccharides (NIFEREX) 150 MG capsule Take 150 mg by mouth daily.     [provider]  lactose free nutrition (BOOST) LIQD Take 237 mLs by mouth 3 (three) times daily between meals. Chocolate flavor    [provider]  levothyroxine (SYNTHROID, LEVOTHROID) 25 MCG tablet Take 25 mcg by mouth daily before breakfast.    [provider]  metoprolol tartrate (LOPRESSOR) 25 MG tablet TAKE 1 TABLET BY MOUTH TWICE A DAY 09/18/22   [place], Peter M, MD  nitroGLYCERIN (NITROSTAT) 0.4 MG SL tablet Place 1 tablet (0.4 mg total) under the tongue every 5 (five) minutes as needed for chest pain. 06/28/21   [place], Peter M, MD  pantoprazole (PROTONIX) 40 MG tablet Take 1 tablet (40 mg total) by mouth daily at 6 (six) AM. 03/09/17   Regalado, Brigido Canales, MD   No Known Allergies Review of Systems  Unable to perform ROS:  Acuity of condition    Physical Exam Vitals and nursing note reviewed.  Constitutional:      General: She is not in acute distress.    Appearance: She is ill-appearing.  Pulmonary:     Effort: No respiratory distress.     Breath sounds: Wheezing present.  Skin:    General: Skin is warm and dry.  Neurological:     Mental Status: She is lethargic.     Motor: Weakness present.     Vital Signs: BP (!) 106/53   Pulse 79   Temp (!) 96.9 F (36.1 C) (Rectal)   Resp (!) 23   Ht 5\' 4"  (1.626 m)   Wt 54 kg   SpO2 95%   BMI 20.43 kg/m      Pain Score: 0-No pain   SpO2: SpO2: 95 % O2 Device:SpO2: 95 % O2 Flow Rate: .O2 Flow Rate (L/min): 2 L/min  IO: Intake/output summary: No intake or output data in the 24 hours ending 07/12/23 1408  LBM:   Baseline Weight: Weight: 54 kg Most recent weight: Weight: 54 kg     Palliative Assessment/Data: PPS 10%     Time In: 1400 Time Out: 1515 Time Total: 75 minutes  Signed by: Annette Killings, NP   Please contact Palliative Medicine Team phone at [phone] for questions and concerns.  For individual provider: See Amion  *Portions of this note are a verbal dictation therefore any spelling and/or grammatical errors are due to the "[employer]agon [employer] One" system interpretation.

## 2023-07-12 NOTE — ED Provider Notes (Signed)
 [facility] AT [facility] Provider Note   CSN: [id number] Arrival date & time: 07/12/23  1126     History  Chief Complaint  Patient presents with   Shortness of Breath   Weakness    Julie Lara is a 89 y.o. female.  89 year old female with history of aortic insufficiency, CHF, and CAD status post PCI who presents emergency department with shortness of breath and low blood sugar.  Patient was seen in the emergency department several days ago for shortness of breath.  Had a COVID and flu, troponins, EKG, and chest x-ray that were reassuring.  Discharged back home where in the past 2 days she has developed worsening shortness of breath.  Had a low blood sugar at home and was given D10.  Reportedly her blood sugar for EMS was 436 afterwards.  Tells me she is not having chest pain.  No cough recently.  Is on Lasix for her leg swelling.  No history of smoking or COPD.       Home Medications Prior to Admission medications   Medication Sig Start Date End Date Taking? Authorizing Provider  albuterol (VENTOLIN HFA) 108 (90 Base) MCG/ACT inhaler Inhale 2 puffs into the lungs every 6 (six) hours as needed for wheezing or shortness of breath.   Yes [provider]  aspirin EC 81 MG tablet Take 81 mg by mouth every evening.   Yes [provider]  atorvastatin (LIPITOR) 40 MG tablet TAKE 1 TABLET BY MOUTH DAILY AT 6 PM. Patient taking differently: Take 40 mg by mouth every evening. 12/13/22  Yes [place], Peter M, MD  b complex vitamins capsule Take 1 capsule by mouth daily.   Yes [provider]  cefdinir (OMNICEF) 300 MG capsule Take 300 mg by mouth 2 (two) times daily.   Yes [provider]  Cyanocobalamin (VITAMIN B-12 PO) Take 1 tablet by mouth daily with lunch.   Yes [provider]  Ferrous Sulfate (IRON PO) Take 1 tablet by mouth daily with lunch.   Yes [provider]  furosemide (LASIX) 40 MG tablet  TAKE 1 TABLET BY MOUTH TWICE A DAY Patient taking differently: Take 40 mg by mouth 2 (two) times daily with breakfast and lunch. 09/18/22  Yes [place], Peter M, MD  lactose free nutrition (BOOST) LIQD Take 237 mLs by mouth 3 (three) times daily as needed (low appetite). Chocolate flavor preferred.   Yes [provider]  levothyroxine (SYNTHROID, LEVOTHROID) 25 MCG tablet Take 25 mcg by mouth daily.   Yes [provider]  metoprolol tartrate (LOPRESSOR) 25 MG tablet TAKE 1 TABLET BY MOUTH TWICE A DAY 09/18/22  Yes [place], Peter M, MD  Multiple Vitamins-Minerals (WOMENS MULTIVITAMIN GUMMIES) CHEW Chew 1 each by mouth daily with lunch.   Yes [provider]  nitroGLYCERIN (NITROSTAT) 0.4 MG SL tablet Place 1 tablet (0.4 mg total) under the tongue every 5 (five) minutes as needed for chest pain. 06/28/21  Yes [place], Peter M, MD  pantoprazole (PROTONIX) 40 MG tablet Take 1 tablet (40 mg total) by mouth daily at 6 (six) AM. Patient taking differently: Take 40 mg by mouth daily. 03/09/17  Yes Regalado, Belkys A, MD  predniSONE (DELTASONE) 10 MG tablet Take 10 mg by mouth daily with breakfast.   Yes [provider]      Allergies    Patient has no known allergies.    Review of Systems   Review of Systems  Physical Exam  Updated Vital Signs BP 98/61   Pulse 91   Temp 98 F (36.7 C) (Axillary)   Resp 16   Ht 5\' 4"  (1.626 m)   Wt 54 kg   SpO2 94%   BMI 20.43 kg/m  Physical Exam Vitals and nursing note reviewed.  Constitutional:      General: She is not in acute distress.    Appearance: She is well-developed.     Comments: Resting with her eyes closed but easily arousable  HENT:     Head: Normocephalic and atraumatic.     Right Ear: External ear normal.     Left Ear: External ear normal.     Nose: Nose normal.  Eyes:     Extraocular Movements: Extraocular movements intact.     Conjunctiva/sclera: Conjunctivae normal.     Pupils: Pupils are equal, round,  and reactive to light.  Cardiovascular:     Rate and Rhythm: Normal rate and regular rhythm.     Heart sounds: No murmur heard. Pulmonary:     Effort: Pulmonary effort is normal. No respiratory distress.     Breath sounds: Wheezing and rhonchi present.     Comments: On 2 L nasal cannula Musculoskeletal:     Cervical back: Normal range of motion and neck supple.     Right lower leg: Edema present.     Left lower leg: Edema present.  Skin:    General: Skin is warm and dry.  Neurological:     Mental Status: She is alert and oriented to person, place, and time. Mental status is at baseline.  Psychiatric:        Mood and Affect: Mood normal.     ED Results / Procedures / Treatments   Labs (all labs ordered are listed, but only abnormal results are displayed) Labs Reviewed  COMPREHENSIVE METABOLIC PANEL WITH GFR - Abnormal; Notable for the following components:      Result Value   Glucose, Bld 278 (*)    BUN 29 (*)    Creatinine, Ser 1.26 (*)    Calcium 8.8 (*)    Albumin 3.2 (*)    AST 62 (*)    GFR, Estimated 38 (*)    All other components within normal limits  CBC - Abnormal; Notable for the following components:   RDW 16.0 (*)    Platelets 92 (*)    All other components within normal limits  BRAIN NATRIURETIC PEPTIDE - Abnormal; Notable for the following components:   B Natriuretic Peptide 613.3 (*)    All other components within normal limits  CBG MONITORING, ED - Abnormal; Notable for the following components:   Glucose-Capillary 225 (*)    All other components within normal limits  I-STAT VENOUS BLOOD GAS, ED - Abnormal; Notable for the following components:   pO2, Ven 104 (*)    Bicarbonate 31.6 (*)    TCO2 33 (*)    Acid-Base Excess 5.0 (*)    Calcium, Ion 1.14 (*)    HCT 34.0 (*)    Hemoglobin 11.6 (*)    All other components within normal limits  CBG MONITORING, ED - Abnormal; Notable for the following components:   Glucose-Capillary 112 (*)    All other  components within normal limits  TROPONIN I (HIGH SENSITIVITY) - Abnormal; Notable for the following components:   Troponin I (High Sensitivity) 22 (*)    All other components within normal limits  TROPONIN I (HIGH SENSITIVITY) - Abnormal; Notable for the following components:  Troponin I (High Sensitivity) 27 (*)    All other components within normal limits  CULTURE, BLOOD (ROUTINE X 2)  CULTURE, BLOOD (ROUTINE X 2)  RESPIRATORY PANEL BY PCR  PROCALCITONIN  COMPREHENSIVE METABOLIC PANEL WITH GFR  CBC  PROCALCITONIN    EKG EKG Interpretation Date/Time:  Friday July 12 2023 11:49:23 EDT Ventricular Rate:  98 PR Interval:    QRS Duration:  164 QT Interval:  391 QTC Calculation: 500 R Axis:   -61  Text Interpretation: Atrial fibrillation Ventricular premature complex RBBB and LAFB Confirmed by Vonita Moss [phone]) on 07/12/2023 2:20:40 PM  Radiology DG Chest Port 1 View Result Date: 07/12/2023 CLINICAL DATA:  Shortness of breath EXAM: PORTABLE CHEST 1 VIEW COMPARISON:  Chest radiograph dated 07/08/2023 FINDINGS: Normal lung volumes. No focal consolidations. No pleural effusion or pneumothorax. Similar markedly enlarged cardiomediastinal silhouette. No acute osseous abnormality. IMPRESSION: 1. Similar marked cardiomegaly. 2. No focal consolidations. Electronically Signed   By: Agustin Cree M.D.   On: 07/12/2023 13:48    Procedures Procedures    Medications Ordered in ED Medications  azithromycin (ZITHROMAX) 500 mg in sodium chloride 0.9 % 250 mL IVPB (500 mg Intravenous New Bag/Given 07/12/23 1737)  aspirin EC tablet 81 mg (has no administration in time range)  furosemide (LASIX) tablet 40 mg (has no administration in time range)  metoprolol tartrate (LOPRESSOR) tablet 25 mg (has no administration in time range)  atorvastatin (LIPITOR) tablet 40 mg (has no administration in time range)  levothyroxine (SYNTHROID) tablet 25 mcg (has no administration in time range)   pantoprazole (PROTONIX) EC tablet 40 mg (has no administration in time range)  heparin injection 5,000 Units (5,000 Units Subcutaneous Given 07/12/23 1653)  sodium chloride flush (NS) 0.9 % injection 3 mL (has no administration in time range)  acetaminophen (TYLENOL) tablet 650 mg (has no administration in time range)    Or  acetaminophen (TYLENOL) suppository 650 mg (has no administration in time range)  polyethylene glycol (MIRALAX / GLYCOLAX) packet 17 g (has no administration in time range)  doxycycline (VIBRAMYCIN) 100 mg in sodium chloride 0.9 % 250 mL IVPB (has no administration in time range)  cefTRIAXone (ROCEPHIN) 1 g in sodium chloride 0.9 % 100 mL IVPB (has no administration in time range)  ipratropium-albuterol (DUONEB) 0.5-2.5 (3) MG/3ML nebulizer solution 3 mL (has no administration in time range)  predniSONE (DELTASONE) tablet 50 mg (has no administration in time range)  albuterol (PROVENTIL) (2.5 MG/3ML) 0.083% nebulizer solution 5 mg (5 mg Nebulization Given 07/12/23 1241)  methylPREDNISolone sodium succinate (SOLU-MEDROL) 125 mg/2 mL injection 125 mg (125 mg Intravenous Given 07/12/23 1252)  ipratropium (ATROVENT) nebulizer solution 0.5 mg (0.5 mg Nebulization Given 07/12/23 1241)  albuterol (PROVENTIL) (2.5 MG/3ML) 0.083% nebulizer solution 10 mg (10 mg Nebulization Given 07/12/23 1436)  furosemide (LASIX) injection 40 mg (40 mg Intravenous Given 07/12/23 1647)  cefTRIAXone (ROCEPHIN) 2 g in sodium chloride 0.9 % 100 mL IVPB (0 g Intravenous Stopped 07/12/23 1734)    ED Course/ Medical Decision Making/ A&P Clinical Course as of 07/12/23 1742  Fri Jul 12, 2023  1236 Full code verified with patient [RP]  1420 Creatinine(!): 1.26 At baseline [RP]  1529 Palliative (amber) has talked to patient and family. Has been made DNR/DNI at this time.  [RP]  1531 Signed out to Dr Silverio Lay [RP]    Clinical Course User Index [RP] Rondel Baton, MD  Medical Decision Making Amount and/or Complexity of Data Reviewed Labs: ordered. Decision-making details documented in ED Course. Radiology: ordered.  Risk Prescription drug management. Decision regarding hospitalization.   ERIANNA JOLLY is a 89 y.o. female with comorbidities that complicate the patient evaluation including aortic insufficiency, CHF, and CAD status post PCI who presents emergency department with shortness of breath and low blood sugar.    Initial Ddx:  Wheezing, hypercapnic respiratory failure, hypoxia, pneumonia, CHF  MDM/Course:  Patient presents emergency department with shortness of breath, 2 L oxygen requirement, altered mental status, and hyperglycemia at home.  Was recently seen for respiratory illness.  On exam does appear to have diffuse wheezing/rhonchi.  Does appear winded to me as well.  Does have a new oxygen requirement.  Does not have a history of COPD or smoking.  Suspect this may be from infection was given antibiotics, nebulizer, and Solu-Medrol.  Chest x-ray without clear infiltrate so could potentially reflect a URI or potentially a occult pneumonia.  Venous blood gas without evidence of CO2 retention.  Fingerstick glucose remained WNL while in the emergency department.  Palliative care was consulted and had extensive discussions with the family and patient and patient was made DNR/DNI.  Patient signed out to the oncoming physician awaiting callback from hospitalist.  This patient presents to the ED for concern of complaints listed in HPI, this involves an extensive number of treatment options, and is a complaint that carries with it a high risk of complications and morbidity. Disposition including potential need for admission considered.   Dispo: Admit to Floor  Additional history obtained from family Records reviewed Outpatient Clinic Notes The following labs were independently interpreted: Chemistry and show CKD I independently reviewed the  following imaging with scope of interpretation limited to determining acute life threatening conditions related to emergency care: Chest x-ray and agree with the radiologist interpretation with the following exceptions: none I personally reviewed and interpreted cardiac monitoring: normal sinus rhythm  I personally reviewed and interpreted the pt's EKG: see above for interpretation  I have reviewed the patients home medications and made adjustments as needed Consults: Hospitalist Social Determinants of health:  Geriatric  Portions of this note were generated with Scientist, clinical (histocompatibility and immunogenetics). Dictation errors may occur despite best attempts at proofreading.     Final Clinical Impression(s) / ED Diagnoses Final diagnoses:  Hypoxia  Disorientation  Wheezing    Rx / DC Orders ED Discharge Orders     None         Rondel Baton, MD 07/12/23 1742

## 2023-07-13 ENCOUNTER — Observation Stay (HOSPITAL_COMMUNITY)

## 2023-07-13 ENCOUNTER — Inpatient Hospital Stay (HOSPITAL_COMMUNITY)

## 2023-07-13 DIAGNOSIS — R0902 Hypoxemia: Principal | ICD-10-CM

## 2023-07-13 DIAGNOSIS — R062 Wheezing: Secondary | ICD-10-CM

## 2023-07-13 DIAGNOSIS — J9691 Respiratory failure, unspecified with hypoxia: Secondary | ICD-10-CM | POA: Diagnosis not present

## 2023-07-13 DIAGNOSIS — I420 Dilated cardiomyopathy: Secondary | ICD-10-CM | POA: Diagnosis present

## 2023-07-13 DIAGNOSIS — R410 Disorientation, unspecified: Secondary | ICD-10-CM

## 2023-07-13 DIAGNOSIS — J9601 Acute respiratory failure with hypoxia: Secondary | ICD-10-CM | POA: Diagnosis present

## 2023-07-13 DIAGNOSIS — I700 Atherosclerosis of aorta: Secondary | ICD-10-CM | POA: Diagnosis not present

## 2023-07-13 DIAGNOSIS — D649 Anemia, unspecified: Secondary | ICD-10-CM | POA: Diagnosis present

## 2023-07-13 DIAGNOSIS — Z833 Family history of diabetes mellitus: Secondary | ICD-10-CM | POA: Diagnosis not present

## 2023-07-13 DIAGNOSIS — I10 Essential (primary) hypertension: Secondary | ICD-10-CM | POA: Diagnosis not present

## 2023-07-13 DIAGNOSIS — E43 Unspecified severe protein-calorie malnutrition: Secondary | ICD-10-CM | POA: Diagnosis present

## 2023-07-13 DIAGNOSIS — Z66 Do not resuscitate: Secondary | ICD-10-CM | POA: Diagnosis present

## 2023-07-13 DIAGNOSIS — Z90710 Acquired absence of both cervix and uterus: Secondary | ICD-10-CM | POA: Diagnosis not present

## 2023-07-13 DIAGNOSIS — R0602 Shortness of breath: Secondary | ICD-10-CM | POA: Diagnosis not present

## 2023-07-13 DIAGNOSIS — E039 Hypothyroidism, unspecified: Secondary | ICD-10-CM | POA: Diagnosis not present

## 2023-07-13 DIAGNOSIS — J9600 Acute respiratory failure, unspecified whether with hypoxia or hypercapnia: Secondary | ICD-10-CM | POA: Diagnosis not present

## 2023-07-13 DIAGNOSIS — Z711 Person with feared health complaint in whom no diagnosis is made: Secondary | ICD-10-CM | POA: Diagnosis not present

## 2023-07-13 DIAGNOSIS — Z955 Presence of coronary angioplasty implant and graft: Secondary | ICD-10-CM | POA: Diagnosis not present

## 2023-07-13 DIAGNOSIS — I5023 Acute on chronic systolic (congestive) heart failure: Secondary | ICD-10-CM | POA: Diagnosis present

## 2023-07-13 DIAGNOSIS — Z9981 Dependence on supplemental oxygen: Secondary | ICD-10-CM | POA: Diagnosis not present

## 2023-07-13 DIAGNOSIS — I5084 End stage heart failure: Secondary | ICD-10-CM | POA: Diagnosis not present

## 2023-07-13 DIAGNOSIS — M129 Arthropathy, unspecified: Secondary | ICD-10-CM | POA: Diagnosis not present

## 2023-07-13 DIAGNOSIS — N1832 Chronic kidney disease, stage 3b: Secondary | ICD-10-CM | POA: Diagnosis present

## 2023-07-13 DIAGNOSIS — I2510 Atherosclerotic heart disease of native coronary artery without angina pectoris: Secondary | ICD-10-CM | POA: Diagnosis present

## 2023-07-13 DIAGNOSIS — R0603 Acute respiratory distress: Secondary | ICD-10-CM

## 2023-07-13 DIAGNOSIS — Z7982 Long term (current) use of aspirin: Secondary | ICD-10-CM | POA: Diagnosis not present

## 2023-07-13 DIAGNOSIS — I5022 Chronic systolic (congestive) heart failure: Secondary | ICD-10-CM | POA: Diagnosis not present

## 2023-07-13 DIAGNOSIS — I480 Paroxysmal atrial fibrillation: Secondary | ICD-10-CM | POA: Diagnosis present

## 2023-07-13 DIAGNOSIS — N1831 Chronic kidney disease, stage 3a: Secondary | ICD-10-CM | POA: Diagnosis not present

## 2023-07-13 DIAGNOSIS — I4891 Unspecified atrial fibrillation: Secondary | ICD-10-CM | POA: Diagnosis not present

## 2023-07-13 DIAGNOSIS — I7121 Aneurysm of the ascending aorta, without rupture: Secondary | ICD-10-CM | POA: Diagnosis not present

## 2023-07-13 DIAGNOSIS — Z7189 Other specified counseling: Secondary | ICD-10-CM | POA: Diagnosis not present

## 2023-07-13 DIAGNOSIS — R531 Weakness: Secondary | ICD-10-CM

## 2023-07-13 DIAGNOSIS — J123 Human metapneumovirus pneumonia: Secondary | ICD-10-CM | POA: Diagnosis present

## 2023-07-13 DIAGNOSIS — E785 Hyperlipidemia, unspecified: Secondary | ICD-10-CM | POA: Diagnosis present

## 2023-07-13 DIAGNOSIS — D696 Thrombocytopenia, unspecified: Secondary | ICD-10-CM | POA: Diagnosis present

## 2023-07-13 DIAGNOSIS — Z79899 Other long term (current) drug therapy: Secondary | ICD-10-CM | POA: Diagnosis not present

## 2023-07-13 DIAGNOSIS — Z7401 Bed confinement status: Secondary | ICD-10-CM | POA: Diagnosis not present

## 2023-07-13 DIAGNOSIS — Z515 Encounter for palliative care: Secondary | ICD-10-CM | POA: Diagnosis not present

## 2023-07-13 DIAGNOSIS — E873 Alkalosis: Secondary | ICD-10-CM | POA: Diagnosis present

## 2023-07-13 DIAGNOSIS — E890 Postprocedural hypothyroidism: Secondary | ICD-10-CM | POA: Diagnosis present

## 2023-07-13 DIAGNOSIS — I351 Nonrheumatic aortic (valve) insufficiency: Secondary | ICD-10-CM | POA: Diagnosis present

## 2023-07-13 DIAGNOSIS — Z79890 Hormone replacement therapy (postmenopausal): Secondary | ICD-10-CM | POA: Diagnosis not present

## 2023-07-13 DIAGNOSIS — Z789 Other specified health status: Secondary | ICD-10-CM | POA: Diagnosis not present

## 2023-07-13 DIAGNOSIS — I252 Old myocardial infarction: Secondary | ICD-10-CM | POA: Diagnosis not present

## 2023-07-13 DIAGNOSIS — I130 Hypertensive heart and chronic kidney disease with heart failure and stage 1 through stage 4 chronic kidney disease, or unspecified chronic kidney disease: Secondary | ICD-10-CM | POA: Diagnosis present

## 2023-07-13 DIAGNOSIS — I517 Cardiomegaly: Secondary | ICD-10-CM | POA: Diagnosis not present

## 2023-07-13 LAB — GLUCOSE, CAPILLARY
Glucose-Capillary: 157 mg/dL — ABNORMAL HIGH (ref 70–99)
Glucose-Capillary: 96 mg/dL (ref 70–99)

## 2023-07-13 LAB — ECHOCARDIOGRAM COMPLETE
AV Vena cont: 0.8 cm
Height: 64 in
S' Lateral: 3.2 cm
Weight: 1904.77 [oz_av]

## 2023-07-13 LAB — BRAIN NATRIURETIC PEPTIDE: B Natriuretic Peptide: 498.4 pg/mL — ABNORMAL HIGH (ref 0.0–100.0)

## 2023-07-13 LAB — COMPREHENSIVE METABOLIC PANEL WITH GFR
ALT: 24 U/L (ref 0–44)
AST: 36 U/L (ref 15–41)
Albumin: 3 g/dL — ABNORMAL LOW (ref 3.5–5.0)
Alkaline Phosphatase: 81 U/L (ref 38–126)
Anion gap: 13 (ref 5–15)
BUN: 26 mg/dL — ABNORMAL HIGH (ref 8–23)
CO2: 31 mmol/L (ref 22–32)
Calcium: 8.8 mg/dL — ABNORMAL LOW (ref 8.9–10.3)
Chloride: 99 mmol/L (ref 98–111)
Creatinine, Ser: 1.26 mg/dL — ABNORMAL HIGH (ref 0.44–1.00)
GFR, Estimated: 38 mL/min — ABNORMAL LOW (ref >60)
Glucose, Bld: 117 mg/dL — ABNORMAL HIGH (ref 70–99)
Potassium: 4.1 mmol/L (ref 3.5–5.1)
Sodium: 143 mmol/L (ref 135–145)
Total Bilirubin: 0.8 mg/dL (ref 0.0–1.2)
Total Protein: 6.4 g/dL — ABNORMAL LOW (ref 6.5–8.1)

## 2023-07-13 LAB — TSH: TSH: 0.509 u[IU]/mL (ref 0.350–4.500)

## 2023-07-13 LAB — CBC
HCT: 34.1 % — ABNORMAL LOW (ref 36.0–46.0)
Hemoglobin: 10.8 g/dL — ABNORMAL LOW (ref 12.0–15.0)
MCH: 30.2 pg (ref 26.0–34.0)
MCHC: 31.7 g/dL (ref 30.0–36.0)
MCV: 95.3 fL (ref 80.0–100.0)
Platelets: 94 10*3/uL — ABNORMAL LOW (ref 150–400)
RBC: 3.58 MIL/uL — ABNORMAL LOW (ref 3.87–5.11)
RDW: 16 % — ABNORMAL HIGH (ref 11.5–15.5)
WBC: 5.1 10*3/uL (ref 4.0–10.5)
nRBC: 0 % (ref 0.0–0.2)

## 2023-07-13 LAB — MAGNESIUM: Magnesium: 2.1 mg/dL (ref 1.7–2.4)

## 2023-07-13 LAB — PROCALCITONIN: Procalcitonin: <0.1 ng/mL

## 2023-07-13 LAB — C-REACTIVE PROTEIN: CRP: 1.5 mg/dL — ABNORMAL HIGH (ref <1.0)

## 2023-07-13 LAB — T4, FREE: Free T4: 1.1 ng/dL (ref 0.61–1.12)

## 2023-07-13 MED ORDER — B COMPLEX-C PO TABS
1.0000 | ORAL_TABLET | Freq: Every day | ORAL | Status: DC
Start: 2023-07-13 — End: 2023-07-20
  Administered 2023-07-13 – 2023-07-20 (×8): 1 via ORAL
  Filled 2023-07-13 (×8): qty 1

## 2023-07-13 MED ORDER — PANTOPRAZOLE SODIUM 40 MG IV SOLR
40.0000 mg | INTRAVENOUS | Status: DC
  Administered 2023-07-13 – 2023-07-19 (×7): 40 mg via INTRAVENOUS
  Filled 2023-07-13 (×8): qty 10

## 2023-07-13 MED ORDER — VITAMIN B-12 1000 MCG PO TABS
1000.0000 ug | ORAL_TABLET | Freq: Every day | ORAL | Status: DC
  Administered 2023-07-13 – 2023-07-19 (×7): 1000 ug via ORAL
  Filled 2023-07-13 (×9): qty 1

## 2023-07-13 MED ORDER — NITROGLYCERIN 0.4 MG SL SUBL: 0.4000 mg | SUBLINGUAL_TABLET | SUBLINGUAL | Status: DC | PRN

## 2023-07-13 MED ORDER — DILTIAZEM HCL 25 MG/5ML IV SOLN
10.0000 mg | Freq: Four times a day (QID) | INTRAVENOUS | Status: DC | PRN
  Administered 2023-07-13 – 2023-07-15 (×2): 10 mg via INTRAVENOUS
  Filled 2023-07-13 (×2): qty 5

## 2023-07-13 MED ORDER — ADULT MULTIVITAMIN W/MINERALS CH
1.0000 | ORAL_TABLET | Freq: Every day | ORAL | Status: DC
  Administered 2023-07-13 – 2023-07-19 (×7): 1 via ORAL
  Filled 2023-07-13 (×8): qty 1

## 2023-07-13 MED ORDER — METHYLPREDNISOLONE SODIUM SUCC 125 MG IJ SOLR
60.0000 mg | INTRAMUSCULAR | Status: DC
  Administered 2023-07-14 – 2023-07-20 (×7): 60 mg via INTRAVENOUS
  Filled 2023-07-13 (×7): qty 2

## 2023-07-13 MED ORDER — AZITHROMYCIN 500 MG PO TABS
500.0000 mg | ORAL_TABLET | Freq: Every day | ORAL | Status: DC
Start: 2023-07-13 — End: 2023-07-17
  Administered 2023-07-13 – 2023-07-15 (×3): 500 mg via ORAL
  Filled 2023-07-13 (×3): qty 1

## 2023-07-13 MED ORDER — ASPIRIN 81 MG PO CHEW
81.0000 mg | CHEWABLE_TABLET | Freq: Every evening | ORAL | Status: DC
  Administered 2023-07-13 – 2023-07-19 (×7): 81 mg via ORAL
  Filled 2023-07-13 (×7): qty 1

## 2023-07-13 NOTE — Progress Notes (Signed)
 Daily Progress Note   Patient Name: Julie Lara       Date: 07/13/2023 DOB: [birth year]  Age: 89 y.o. MRN#: [id number] Attending Physician: Cala Castleman, MD Primary Care Physician: Suan Elm, MD Admit Date: 07/12/2023  Reason for Consultation/Follow-up: Establishing goals of care  Subjective: I have reviewed medical records including EPIC notes, MAR, any available advanced directives as necessary, and labs.  Noted patient's continued respiratory decline after PMT visit yesterday.  Patient is now on HFNC.  Patient was able to work with PT/OT today and they are recommending home with home health if family can provide 24/7 care.  Received report from primary RN -no acute concerns.  Per RN, patient started shift at 10 L HFNC and has been weaned down to 5 L HFNC.  RN reports patient has been up to the chair today, remains confused.  Went to visit patient at bedside - 4 family members present.  Patient was up on [facility] - she is awake, alert, disoriented, able to participate in simple conversation. No signs or non-verbal gestures of pain or discomfort noted. No respiratory distress, increased work of breathing, or secretions noted.  She is on HFNC.   Emotional support provided to family.  Family understand that patient's clinical status remains tenuous; however, they express joy that she has shown some improvement today.  Therapeutic listening provided as they reflected over the events of yesterday and this morning.  At this time family will continue to make stepwise decisions pending patient's clinical course.  If patient continues to improve, family indicate their goal is for her to return home.  Family validates they can provide patient with 24/7 supervision/assistance.  They are hopeful  patient can receive home health services at discharge.  PT/OT have already made this recommendation as long as family can provide adequate support.  Outpatient palliative care reviewed and offered - family are accepting.  All questions and concerns addressed. Encouraged to call with questions and/or concerns. PMT card previously provided.  Length of Stay: 0  Current Medications: Scheduled Meds:   aspirin EC  81 mg Oral QPM   atorvastatin  40 mg Oral QPM   azithromycin  500 mg Oral Daily   B-complex with vitamin C  1 tablet Oral Daily   vitamin B-12  1,000 mcg Oral Q lunch  heparin  5,000 Units Subcutaneous Q8H   levothyroxine  25 mcg Oral Q0600   methylPREDNISolone (SOLU-MEDROL) injection  60 mg Intravenous Q24H   metoprolol tartrate  25 mg Oral BID   multivitamin with minerals  1 tablet Oral Q lunch   pantoprazole (PROTONIX) IV  40 mg Intravenous Q24H   sodium chloride flush  3 mL Intravenous Q12H    Continuous Infusions:   PRN Meds: acetaminophen **OR** acetaminophen, diltiazem, ipratropium-albuterol, nitroGLYCERIN, polyethylene glycol  Physical Exam Vitals and nursing note reviewed.  Constitutional:      General: She is not in acute distress.    Appearance: She is ill-appearing.  Pulmonary:     Effort: No respiratory distress.  Skin:    General: Skin is warm and dry.  Neurological:     Mental Status: She is alert. She is disoriented and confused.     Motor: Weakness present.  Psychiatric:        Attention and Perception: Attention normal.        Behavior: Behavior is cooperative.        Cognition and Memory: Cognition is impaired. Memory is impaired.             Vital Signs: BP (!) 126/99   Pulse (!) 105   Temp 97.7 F (36.5 C) (Oral)   Resp (!) 22   Ht 5\' 4"  (1.626 m)   Wt 54 kg   SpO2 97%   BMI 20.43 kg/m  SpO2: SpO2: 97 % O2 Device: O2 Device: High Flow Nasal Cannula O2 Flow Rate: O2 Flow Rate (L/min): (S) 5 L/min  Intake/output summary:   Intake/Output Summary (Last 24 hours) at 07/13/2023 1402 Last data filed at 07/13/2023 0030 Gross per 24 hour  Intake --  Output 400 ml  Net -400 ml   LBM:   Baseline Weight: Weight: 54 kg Most recent weight: Weight: 54 kg       Palliative Assessment/Data: PPS 50-60%      Patient Active Problem List   Diagnosis Date Noted   Acute respiratory failure with hypoxia (HCC) 07/12/2023   Essential hypertension 02/11/2017   Acute on chronic systolic CHF (congestive heart failure) (HCC) 02/01/2017   HLD (hyperlipidemia) 02/01/2017   CAD (coronary artery disease)    Benign neoplasm of colon 02/23/2013   Near syncope 02/21/2013   Anemia 02/21/2013   Chronic systolic congestive heart failure (HCC) 02/21/2013   GI bleed 02/21/2013   Chronic anticoagulation 02/21/2013   Coronary artery disease with history of myocardial infarction without history of CABG 02/21/2013   Resting chest pain 10/27/2012   Hypothyroidism 10/15/2012   Atrial fibrillation (HCC) 10/15/2012   CKD (chronic kidney disease), stage III (HCC) 10/15/2012   Dilated cardiomyopathy (HCC)    Aortic insufficiency    PAC (premature atrial contraction)     Palliative Care Assessment & Plan   Patient Profile: 89 y.o. female  with past medical history of hypertension, hyperlipidemia, hypothyroidism, CKD 3, chronic systolic CHF, CAD, atrial fibrillation, anemia presented to the ED on 07/12/2023 with complaints of shortness of breath. She was recently seen in ED on 07/03/23 for same - work up was unremarkable and she was sent home.   Assessment: Principal Problem:   Acute respiratory failure with hypoxia (HCC) Active Problems:   Hypothyroidism   Atrial fibrillation (HCC)   CKD (chronic kidney disease), stage III (HCC)   Anemia   Chronic systolic congestive heart failure (HCC)   CAD (coronary artery disease)   HLD (hyperlipidemia)  Essential hypertension    SUMMARY OF RECOMMENDATIONS   Continue to treat the  treatable. In the event of decline, family open to continued discussions regarding transition to full comfort vs ICU admission Continue DNR/DNI as previously documented Family will make stepwise decisions pending patient's clinical course If patient continues to improve, family are hopeful for her discharged back home with home health and outpatient palliative care to follow If goals change to full comfort path, family are most interested in getting patient back home with hospice if stable for transfer [facility] consulted for: Outpatient palliative care referral PMT will continue to follow and support holistically  Goals of Care and Additional Recommendations: Limitations on Scope of Treatment: No Tracheostomy  Code Status:    Code Status Orders  (From admission, onward)           Start     Ordered   07/12/23 1609  Do not attempt resuscitation (DNR)- Limited -Do Not Intubate (DNI)  Continuous       Question Answer Comment  If pulseless and not breathing No CPR or chest compressions.   In Pre-Arrest Conditions (Patient Is Breathing and Has A Pulse) Do not intubate. Provide all appropriate non-invasive medical interventions. Avoid ICU transfer unless indicated or required.   Consent: Discussion documented in EHR or advanced directives reviewed      07/12/23 1609           Code Status History     Date Active Date Inactive Code Status Order ID Comments User Context   07/12/2023 1521 07/12/2023 1609 Limited: Do not attempt resuscitation (DNR) -DNR-LIMITED -Do Not Intubate/DNI  [id number]  Annette Killings, NP ED   07/12/2023 1236 07/12/2023 1521 Full Code [id number]  Ninetta Basket, MD ED   03/07/2017 2102 03/09/2017 1633 Full Code [id number]  Walton Guppy, MD ED   02/01/2017 1953 02/03/2017 1814 Full Code [id number]  Fidencio Hue, MD ED   07/18/2016 0021 07/19/2016 1551 Full Code [id number]  Dea Evert, DO ED   02/21/2013 2026 02/25/2013 1732 Full Code [id number]  Pecolia Bourbon, MD ED        Prognosis:  Unable to determine  Discharge Planning: To Be Determined  Care plan was discussed with primary RN, patient's family  Thank you for allowing the Palliative Medicine Team to assist in the care of this patient.   Total Time 50 minutes Prolonged Time Billed  no       Annette Killings, NP  Please contact Palliative Medicine Team phone at [phone] for questions and concerns.   *Portions of this note are a verbal dictation therefore any spelling and/or grammatical errors are due to the "[employer]agon [employer] One" system interpretation.

## 2023-07-13 NOTE — Progress Notes (Signed)
 PROGRESS NOTE                                                                                                                                                                                                             Patient Demographics:    Julie Lara, is a 89 y.o. female, DOB - [birth year], UJW:[id number]  Outpatient Primary MD for the patient is Suan Elm, MD    LOS - 0  Admit date - 07/12/2023    Chief Complaint  Patient presents with   Shortness of Breath   Weakness       Brief Narrative (HPI from H&P)    89 y.o. female with medical history significant of hypertension, hyperlipidemia, hypothyroidism, CKD 3, chronic systolic CHF, CAD, atrial fibrillation, anemia presenting with worsening shortness of breath.  Workup suggestive of acute hypoxic respiratory failure due to metapneumovirus pNA.   Subjective:    [name] today has, No headache, No chest pain, No abdominal pain - No Nausea, No new weakness tingling or numbness, mild cough and SOB   Assessment  & Plan :    Acute respiratory failure with hypoxia due to metapneumovirus pneumonia.  Patient fairly hypoxic, continue supplemental oxygen 5 to 6 L, BiPAP if needed, placed on IV steroids along with oral azithromycin, likely has mild superimposed bacterial component to her pneumonia as well.  Encouraged to sit in chair use I-S and flutter valve for pulmonary toiletry, monitor closely.  Overall tenuous.  Speech to also evaluate, plan discussed with RT and RN.  SpO2: 100 % O2 Flow Rate (L/min): 12 L/min FiO2 (%): 50 %   Prolonged QTc > QTc 500 on EKG.   - Avoid QTc prolonging medications -Keep electrolytes stable, renew low-dose beta-blocker, repeat EKG 07/14/2023.   Hypertension - Continue beta-blocker as tolerated by blood pressure   Hyperlipidemia - Continue home atorvastatin   Hypothyroidism - Continue home Synthroid, check TSH and  free T4   CKD 3 > Creatinine stable 1.26 - Trend renal function and electrolytes   Chronic systolic CHF EF 35%. > Clinically minimal to no crackles, blood pressure on the softer side and tachycardic due to hypoxia and underlying A-fib, dose Lasix daily continue low-dose beta-blocker.  Renal insufficiency precludes the use of ACE/ARB/Entresto.   CAD > Status post PCI per chart - Continue home ASA, metoprolol, atorvastatin  Paroxysmal atrial fibrillation with RVR > Currently sinus rhythm. - Continue home metoprolol, as needed IV Cardizem - Not on any anticoagulation   Thrombocytopenia > Stable at 92 - Trend CBC      Hypoglycemia > Noted to have some hypoglycemia by EMS.  Responded to D10 now hyperglycemic. - Trend CBGs   CBG (last 3)  Recent Labs    07/12/23 1219 07/12/23 1549 07/12/23 2132  GLUCAP 225* 112* 148*         Condition - Extremely Guarded  Family Communication  :  daughter bedside 07/13/23  Code Status :  DNR  Consults  :  None  PUD Prophylaxis : PPI   Procedures  :     CT -  Marked cardiomegaly. Coronary artery disease. 4.7 cm ascending thoracic aortic aneurysm. Recommend semi-annual imaging followup by CTA or MRA and referral to cardiothoracic surgery if not already obtained.  TTE -       Disposition Plan  :    Status is: Observation   DVT Prophylaxis  :    heparin injection 5,000 Units Start: 07/12/23 1615    Lab Results  Component Value Date   PLT 94 (L) 07/13/2023    Diet :  Diet Order             Diet regular Room service appropriate? Yes; Fluid consistency: Thin  Diet effective now                    Inpatient Medications  Scheduled Meds:  aspirin EC  81 mg Oral QPM   atorvastatin  40 mg Oral QPM   azithromycin  500 mg Oral Daily   B-complex with vitamin C  1 tablet Oral Daily   vitamin B-12  1,000 mcg Oral Q lunch   heparin  5,000 Units Subcutaneous Q8H   levothyroxine  25 mcg Oral Q0600    methylPREDNISolone (SOLU-MEDROL) injection  60 mg Intravenous Q24H   metoprolol tartrate  25 mg Oral BID   multivitamin with minerals  1 tablet Oral Q lunch   pantoprazole  40 mg Oral Daily   sodium chloride flush  3 mL Intravenous Q12H   Continuous Infusions: PRN Meds:.acetaminophen **OR** acetaminophen, diltiazem, ipratropium-albuterol, nitroGLYCERIN, polyethylene glycol     Objective:   Vitals:   07/12/23 2135 07/13/23 0003 07/13/23 0314 07/13/23 0542  BP: 107/66 117/70 110/86 110/79  Pulse: 94 99 (!) 102   Resp: 17 (!) 25 (!) 31   Temp: (!) 97.5 F (36.4 C) (!) 97.5 F (36.4 C) 98.3 F (36.8 C) 97.7 F (36.5 C)  TempSrc: Axillary Axillary Oral Oral  SpO2: 91% 99% 100%   Weight:      Height:        Wt Readings from Last 3 Encounters:  07/12/23 54 kg  07/08/23 54 kg  07/31/22 56 kg     Intake/Output Summary (Last 24 hours) at 07/13/2023 1020 Last data filed at 07/13/2023 0030 Gross per 24 hour  Intake --  Output 400 ml  Net -400 ml     Physical Exam  Awake Alert, No new F.N deficits, Normal affect Industry.AT,PERRAL Supple Neck, No JVD,   Symmetrical Chest wall movement, Good air movement bilaterally, CTAB RRR,No Gallops,Rubs or new Murmurs,  +ve B.Sounds, Abd Soft, No tenderness,   No Cyanosis, Clubbing or edema       Data Review:    Recent Labs  Lab 07/08/23 2032 07/12/23 1220 07/12/23 1511 07/13/23 0612  WBC 4.5 5.3  --  5.1  HGB 11.8* 12.1 11.6* 10.8*  HCT 38.1 38.5 34.0* 34.1*  PLT 92* 92*  --  94*  MCV 97.7 97.0  --  95.3  MCH 30.3 30.5  --  30.2  MCHC 31.0 31.4  --  31.7  RDW 16.2* 16.0*  --  16.0*  LYMPHSABS 2.2  --   --   --   MONOABS 0.9  --   --   --   EOSABS 0.0  --   --   --   BASOSABS 0.0  --   --   --     Recent Labs  Lab 07/08/23 2032 07/12/23 1220 07/12/23 1511 07/12/23 1631 07/13/23 0612  NA 142 139 141  --  143  K 3.6 3.9 3.6  --  4.1  CL 100 99  --   --  99  CO2 29 27  --   --  31  ANIONGAP 13 13  --   --  13   GLUCOSE 120* 278*  --   --  117*  BUN 22 29*  --   --  26*  CREATININE 1.32* 1.26*  --   --  1.26*  AST 33 62*  --   --  36  ALT 22 29  --   --  24  ALKPHOS 96 90  --   --  81  BILITOT 1.0 1.1  --   --  0.8  ALBUMIN 3.5 3.2*  --   --  3.0*  CRP  --   --   --   --  1.5*  PROCALCITON  --   --   --  <0.10 <0.10  BNP 497.8* 613.3*  --   --  498.4*  MG  --   --   --   --  2.1  CALCIUM 9.2 8.8*  --   --  8.8*      Recent Labs  Lab 07/08/23 2032 07/12/23 1220 07/12/23 1631 07/13/23 0612  CRP  --   --   --  1.5*  PROCALCITON  --   --  <0.10 <0.10  BNP 497.8* 613.3*  --  498.4*  MG  --   --   --  2.1  CALCIUM 9.2 8.8*  --  8.8*    --------------------------------------------------------------------------------------------------------------- Lab Results  Component Value Date   CHOL 146 12/15/2012   HDL 64.10 12/15/2012   LDLCALC 71 12/15/2012   TRIG 54.0 12/15/2012   CHOLHDL 2 12/15/2012      Micro Results Recent Results (from the past 240 hours)  Resp panel by RT-PCR (RSV, Flu A&B, Covid) Anterior Nasal Swab     Status: None   Collection Time: 07/08/23  8:37 PM   Specimen: Anterior Nasal Swab  Result Value Ref Range Status   SARS Coronavirus 2 by RT PCR NEGATIVE NEGATIVE Final   Influenza A by PCR NEGATIVE NEGATIVE Final   Influenza B by PCR NEGATIVE NEGATIVE Final    Comment: (NOTE) The Xpert Xpress SARS-CoV-2/FLU/RSV plus assay is intended as an aid in the diagnosis of influenza from Nasopharyngeal swab specimens and should not be used as a sole basis for treatment. Nasal washings and aspirates are unacceptable for Xpert Xpress SARS-CoV-2/FLU/RSV testing.  Fact Sheet for Patients: BloggerCourse.com  Fact Sheet for Healthcare Providers: SeriousBroker.it  This test is not yet approved or cleared by the [place]  FDA and has been authorized for detection and/or diagnosis of SARS-CoV-2 by FDA under an  Emergency Use Authorization (EUA). This  EUA will remain in effect (meaning this test can be used) for the duration of the COVID-19 declaration under Section 564(b)(1) of the Act, 21 U.S.C. section 360bbb-3(b)(1), unless the authorization is terminated or revoked.     Resp Syncytial Virus by PCR NEGATIVE NEGATIVE Final    Comment: (NOTE) Fact Sheet for Patients: BloggerCourse.com  Fact Sheet for Healthcare Providers: SeriousBroker.it  This test is not yet approved or cleared by the [place]  FDA and has been authorized for detection and/or diagnosis of SARS-CoV-2 by FDA under an Emergency Use Authorization (EUA). This EUA will remain in effect (meaning this test can be used) for the duration of the COVID-19 declaration under Section 564(b)(1) of the Act, 21 U.S.C. section 360bbb-3(b)(1), unless the authorization is terminated or revoked.  Performed at [facility], [address] [place], [place] [address]   Respiratory (~20 pathogens) panel by PCR     Status: Abnormal   Collection Time: 07/12/23  4:09 PM   Specimen: Nasopharyngeal Swab; Respiratory  Result Value Ref Range Status   Adenovirus NOT DETECTED NOT DETECTED Final   Coronavirus 229E NOT DETECTED NOT DETECTED Final    Comment: (NOTE) The Coronavirus on the Respiratory Panel, DOES NOT test for the novel  Coronavirus (2019 nCoV)    Coronavirus HKU1 NOT DETECTED NOT DETECTED Final   Coronavirus NL63 NOT DETECTED NOT DETECTED Final   Coronavirus OC43 NOT DETECTED NOT DETECTED Final   Metapneumovirus DETECTED (A) NOT DETECTED Final   Rhinovirus / Enterovirus NOT DETECTED NOT DETECTED Final   Influenza A NOT DETECTED NOT DETECTED Final   Influenza B NOT DETECTED NOT DETECTED Final   Parainfluenza Virus 1 NOT DETECTED NOT DETECTED Final   Parainfluenza Virus 2 NOT DETECTED NOT DETECTED Final   Parainfluenza Virus 3 NOT DETECTED NOT DETECTED Final    Parainfluenza Virus 4 NOT DETECTED NOT DETECTED Final   Respiratory Syncytial Virus NOT DETECTED NOT DETECTED Final   Bordetella pertussis NOT DETECTED NOT DETECTED Final   Bordetella Parapertussis NOT DETECTED NOT DETECTED Final   Chlamydophila pneumoniae NOT DETECTED NOT DETECTED Final   Mycoplasma pneumoniae NOT DETECTED NOT DETECTED Final    Comment: Performed at [facility], [address] [place], [place] [address]  Blood culture (routine x 2)     Status: None (Preliminary result)   Collection Time: 07/12/23  4:51 PM   Specimen: BLOOD RIGHT FOREARM  Result Value Ref Range Status   Specimen Description BLOOD RIGHT FOREARM  Final   Special Requests   Final    BOTTLES DRAWN AEROBIC AND ANAEROBIC Blood Culture results may not be optimal due to an inadequate volume of blood received in culture bottles   Culture   Final    NO GROWTH < 12 HOURS Performed at [facility], [address] [place], [place] [address]    Report Status PENDING  Incomplete  Blood culture (routine x 2)     Status: None (Preliminary result)   Collection Time: 07/12/23  8:56 PM   Specimen: BLOOD LEFT HAND  Result Value Ref Range Status   Specimen Description BLOOD LEFT HAND  Final   Special Requests   Final    BOTTLES DRAWN AEROBIC ONLY Blood Culture results may not be optimal due to an inadequate volume of blood received in culture bottles   Culture   Final    NO GROWTH < 12 HOURS Performed at [facility], [address] [place], [place] [address]  Report Status PENDING  Incomplete    Radiology Report DG Chest Port 1 View Result Date: 07/13/2023 CLINICAL DATA:  Shortness of breath. EXAM: PORTABLE CHEST 1 VIEW COMPARISON:  07/12/2023 FINDINGS: The cardio pericardial silhouette is enlarged. There is pulmonary vascular congestion without overt pulmonary edema. Retrocardiac opacity is compatible with atelectasis seen on CT imaging performed 4 hours prior. No evidence for substantial  pleural effusion. Bones are diffusely demineralized. Telemetry leads overlie the chest. IMPRESSION: 1. Enlargement of the cardiopericardial silhouette with pulmonary vascular congestion. 2. Retrocardiac opacity compatible with atelectasis seen on CT imaging performed 4 hours prior. Electronically Signed   By: Donnal Fusi M.D.   On: 07/13/2023 06:30   CT CHEST WO CONTRAST Result Date: 07/13/2023 CLINICAL DATA:  Respiratory illness, nondiagnostic xray. Acute respiratory failure with hypoxia. Shortness of breath. EXAM: CT CHEST WITHOUT CONTRAST TECHNIQUE: Multidetector CT imaging of the chest was performed following the standard protocol without IV contrast. RADIATION DOSE REDUCTION: This exam was performed according to the departmental dose-optimization program which includes automated exposure control, adjustment of the mA and/or kV according to patient size and/or use of iterative reconstruction technique. COMPARISON:  Chest x-ray 07/12/2023. FINDINGS: Cardiovascular: Marked cardiomegaly. Coronary artery and aortic atherosclerosis. Aneurysmal dilatation of the ascending thoracic aorta measuring up to 4.7 cm. Mediastinum/Nodes: No mediastinal, hilar, or axillary adenopathy. Trachea and esophagus are unremarkable. Thyroid unremarkable. Lungs/Pleura: Areas of atelectasis in the lungs bilaterally adjacent to the markedly enlarged heart. No pleural effusions or confluent airspace opacities. Mild peribronchial thickening. Upper Abdomen: No acute findings Musculoskeletal: Chest wall soft tissues are unremarkable. No acute bony abnormality. IMPRESSION: Marked cardiomegaly. Coronary artery disease. 4.7 cm ascending thoracic aortic aneurysm. Recommend semi-annual imaging followup by CTA or MRA and referral to cardiothoracic surgery if not already obtained. This recommendation follows 2010 ACCF/AHA/AATS/ACR/ASA/SCA/SCAI/SIR/STS/SVM Guidelines for the Diagnosis and Management of Patients With Thoracic Aortic Disease.  Circulation. 2010; 121: F621-H086. Aortic aneurysm NOS (ICD10-I71.9) Aortic Atherosclerosis (ICD10-I70.0). Electronically Signed   By: Janeece Mechanic M.D.   On: 07/13/2023 02:27   DG Chest Port 1 View Result Date: 07/12/2023 CLINICAL DATA:  Shortness of breath EXAM: PORTABLE CHEST 1 VIEW COMPARISON:  Chest radiograph dated 07/08/2023 FINDINGS: Normal lung volumes. No focal consolidations. No pleural effusion or pneumothorax. Similar markedly enlarged cardiomediastinal silhouette. No acute osseous abnormality. IMPRESSION: 1. Similar marked cardiomegaly. 2. No focal consolidations. Electronically Signed   By: Limin  Xu M.D.   On: 07/12/2023 13:48     Signature  -   Lynnwood Sauer M.D on 07/13/2023 at 10:20 AM   -  To page go to [email or web address]

## 2023-07-13 NOTE — Evaluation (Signed)
 Occupational Therapy Evaluation Patient Details Name: Julie Lara MRN: [id number] DOB: 07-10-1920 Today's Date: 07/13/2023   History of Present Illness   Patient is 89 yo female admitted on 07/12/23 for acute respioratory failure with hypoxia. PMH significant for HTN, hyperlipidemia, hypothyroidism, CKD 3, chronic systolic CHF, CAD, atrial fibrillation, and anemia.     Clinical Impressions PTA, pt lived at home with daughter and with grandchildren frequently checking on her. Upon eval, pt on 10L HFNC; requiring up to 12L during mobility to maintain SpO2 >88 when monitor with good PLETH. Pt neeidng min A for transfers and LB ADL. Will continue to follow and recommending HHOT at discharge.      If plan is discharge home, recommend the following:   A little help with walking and/or transfers;A little help with bathing/dressing/bathroom;Assistance with cooking/housework;Help with stairs or ramp for entrance;Assist for transportation     Functional Status Assessment   Patient has had a recent decline in their functional status and demonstrates the ability to make significant improvements in function in a reasonable and predictable amount of time.     Equipment Recommendations   BSC/3in1     Recommendations for Other Services         Precautions/Restrictions   Precautions Precautions: Fall;Other (comment) Precaution/Restrictions Comments: monitor HR with activity Restrictions Weight Bearing Restrictions Per Provider Order: No     Mobility Bed Mobility               General bed mobility comments: OOB in chair    Transfers Overall transfer level: Needs assistance Equipment used: Rolling walker (2 wheels) Transfers: Sit to/from Stand Sit to Stand: Min assist                  Balance Overall balance assessment: Needs assistance Sitting-balance support: No upper extremity supported, Feet supported Sitting balance-Leahy Scale: Fair     Standing  balance support: Bilateral upper extremity supported Standing balance-Leahy Scale: Poor Standing balance comment: requires walker support to maintain balance                           ADL either performed or assessed with clinical judgement   ADL Overall ADL's : Needs assistance/impaired Eating/Feeding: Modified independent;Sitting   Grooming: Set up;Sitting   Upper Body Bathing: Set up;Sitting   Lower Body Bathing: Minimal assistance;Sit to/from stand   Upper Body Dressing : Set up;Sitting   Lower Body Dressing: Minimal assistance;Sit to/from stand   Toilet Transfer: Contact guard assist;Ambulation;Rolling walker (2 wheels) Toilet Transfer Details (indicate cue type and reason): uses standard walker at home so initially min A due to pt efforts to pick up walker Toileting- Clothing Manipulation and Hygiene: Total assistance;Sit to/from stand       Functional mobility during ADLs: Contact guard assist;Rolling walker (2 wheels)       Vision Patient Visual Report: No change from baseline       Perception Perception: Not tested       Praxis Praxis: Not tested       Pertinent Vitals/Pain Pain Assessment Pain Assessment: No/denies pain     Extremity/Trunk Assessment Upper Extremity Assessment Upper Extremity Assessment: Generalized weakness   Lower Extremity Assessment Lower Extremity Assessment: Defer to PT evaluation   Cervical / Trunk Assessment Cervical / Trunk Assessment: Kyphotic   Communication Communication Communication: No apparent difficulties Factors Affecting Communication: Hearing impaired   Cognition Arousal: Alert Behavior During Therapy: WFL for tasks assessed/performed Cognition: No apparent  impairments             OT - Cognition Comments: WFL given age; cog assessment limited by pt being hard of hearing                 Following commands: Intact (hard of hearing)       Cueing  General Comments   Cueing  Techniques: Verbal cues;Gestural cues;Tactile cues  B LE edema present; skin redness and poor circulation at bilateral forefoot   Exercises     Shoulder Instructions      Home Living Family/patient expects to be discharged to:: Private residence Living Arrangements: Other (Comment) (daughter) Available Help at Discharge: Family;Available 24 hours/day Type of Home: House Home Access: Level entry     Home Layout: One level     Bathroom Shower/Tub: Chief Strategy Officer: Standard     Home Equipment: Firefighter;Tub bench;Toilet riser          Prior Functioning/Environment Prior Level of Function : Other (comment)             Mobility Comments: No falls in the last year. Ambulates household distances with standard walker modI. Assist for shower transfers. ADLs Comments: grand daughter A with bathing, home making    OT Problem List: Decreased strength;Decreased activity tolerance;Impaired balance (sitting and/or standing);Decreased knowledge of use of DME or AE;Cardiopulmonary status limiting activity   OT Treatment/Interventions: Self-care/ADL training;Therapeutic exercise;DME and/or AE instruction;Therapeutic activities;Balance training;Patient/family education      OT Goals(Current goals can be found in the care plan section)   Acute Rehab OT Goals Patient Stated Goal: get better OT Goal Formulation: With patient Time For Goal Achievement: 07/27/23 Potential to Achieve Goals: Good   OT Frequency:  Min 1X/week    Co-evaluation              AM-PAC OT "6 Clicks" Daily Activity     Outcome Measure Help from another person eating meals?: None Help from another person taking care of personal grooming?: A Little Help from another person toileting, which includes using toliet, bedpan, or urinal?: A Little Help from another person bathing (including washing, rinsing, drying)?: A Little Help from another person to put on and taking off  regular upper body clothing?: A Little Help from another person to put on and taking off regular lower body clothing?: A Little 6 Click Score: 19   End of Session Equipment Utilized During Treatment: Gait belt;Rolling walker (2 wheels) Nurse Communication: Mobility status  Activity Tolerance: Patient tolerated treatment well Patient left: with call bell/phone within reach;in chair;with family/visitor present  OT Visit Diagnosis: Unsteadiness on feet (R26.81);Muscle weakness (generalized) (M62.81);Other (comment) (decreased activity tolerance)                Time: 1610-9604 OT Time Calculation (min): 42 min Charges:  OT General Charges $OT Visit: 1 Visit OT Evaluation $OT Eval Low Complexity: 1 Low OT Treatments $Self Care/Home Management : 23-37 mins  Karilyn Ouch, OTR/L [facility] Acute Rehabilitation Office: [phone]   Emery Hans 07/13/2023, 2:15 PM

## 2023-07-13 NOTE — Evaluation (Signed)
 Clinical/Bedside Swallow Evaluation Patient Details  Name: Julie Lara MRN: [id number] Date of Birth: [birth year]  Today's Date: 07/13/2023 Time: SLP Start Time (ACUTE ONLY): 1107 SLP Stop Time (ACUTE ONLY): 1140 SLP Time Calculation (min) (ACUTE ONLY): 33 min  Past Medical History:  Past Medical History:  Diagnosis Date   Acute blood loss anemia    November 2014   Aortic insufficiency    mild to moderate   Arthritis    "right upper arm" (02/01/2017)   Atrial fibrillation (HCC)    Atrial fibrillation with RVR (HCC)    CAD (coronary artery disease)    Chest pain 04/2010   atypical with negative echocardiogram   Dilated cardiomyopathy (HCC)    Goiter    Hypothyroidism    Idiopathic cardiomyopathy (HCC) 1999   EF 40% by Echo on 10/13/12   Nonspecific abnormal finding in stool contents 02/23/2013   NSTEMI (non-ST elevated myocardial infarction) (HCC) 09/2012   95% prox RCA s/p BMS   PAC (premature atrial contraction)    Chronic   UTI (urinary tract infection) 10/15/2012   Past Surgical History:  Past Surgical History:  Procedure Laterality Date   biopsy of sacral lesion  2010   CARDIAC CATHETERIZATION     COLONOSCOPY N/A 02/23/2013   Procedure: COLONOSCOPY;  Surgeon: Asencion Blacksmith, MD;  Location: [facility] ENDOSCOPY;  Service: Endoscopy;  Laterality: N/A;   CORONARY ANGIOPLASTY WITH STENT PLACEMENT  09/2012   BMS-95% prox RCA   ESOPHAGOGASTRODUODENOSCOPY N/A 02/22/2013   Procedure: ESOPHAGOGASTRODUODENOSCOPY (EGD);  Surgeon: Janel Medford, MD;  Location: [facility] ENDOSCOPY;  Service: Endoscopy;  Laterality: N/A;   LEFT HEART CATHETERIZATION WITH CORONARY ANGIOGRAM N/A 10/14/2012   Procedure: LEFT HEART CATHETERIZATION WITH CORONARY ANGIOGRAM;  Surgeon: Wenona Hamilton, MD;  Location: [facility] CATH LAB;  Service: Cardiovascular;  Laterality: N/A;   PERCUTANEOUS CORONARY STENT INTERVENTION (PCI-S) Right 10/14/2012   Procedure: PERCUTANEOUS CORONARY STENT INTERVENTION (PCI-S);  Surgeon:  Wenona Hamilton, MD;  Location: [facility] CATH LAB;  Service: Cardiovascular;  Laterality: Right;   THYROIDECTOMY  2008   VAGINAL HYSTERECTOMY     Complete   HPI:  Patient is 89 yo female admitted on 07/12/23 for acute respioratory failure with hypoxia. PMH significant for HTN, hyperlipidemia, hypothyroidism, CKD 3, chronic systolic CHF, CAD, atrial fibrillation, and anemia.Pt has a hx of GERD with two previous esophageal barium studies (one in 2018 and one in 2013), both indicate a moderate reflux. Per family reports, the pt takes medication for reflux.    Assessment / Plan / Recommendation  Clinical Impression  Pt seen for skilled ST services for clinical swallow exam. Pt presents with a mild-moderate unspecified dysphagia. The pt was made NPO via nursing due to coughing with thins. The pt prior to hospitalization was on a reg/thin liquid diet with pt's family saying her food was generally softer. The pt's voice was very weak and intermittently aphonic, her cough and throat clearances were weak. Family reports this is baseline. The pt had several instances of productive coughing of thick white secretions t/o the eval. The pt consumed thin liquid via cup, spoon and straw with intermittent wet vocal quality, gurgling, immediate throat clearances and a singular delayed cough. The pt consumed NTL with no overt s/s of aspiration observed, pt reports "it goes down smoother". The pt had oral holding and suspected delayed swallow transit for both thin and NTL. Pt consumed graham cracker in applesauce with some oral holding and mild residue which was effectively cleared with a  cued liquid wash. Several members of her family were present t/o the eval. Given results, diet recs are dys 3/nectar thick liquid with STRICT aspiration and reflex precautions (sit upright during and 30 minutes following, small bites and sips). And FREQUENT oral care. The SLP provided education on aspiration and reviewed the risk of PO intake  and current diet recs. SLP to f/u closely to assess success with diet and possible objective swallow measures to follow. SLP Visit Diagnosis: Dysphagia, unspecified (R13.10)    Aspiration Risk  Mild aspiration risk;Other (comment)    Diet Recommendation Dysphagia 3 (Mech soft);Nectar-thick liquid    Liquid Administration via: Straw;Cup;Spoon Medication Administration: Crushed with puree Supervision: Intermittent supervision to cue for compensatory strategies;Staff to assist with self feeding;Comment (Staff or family) Compensations: Slow rate;Minimize environmental distractions;Small sips/bites Postural Changes: Seated upright at 90 degrees;Remain upright for at least 30 minutes after po intake    Other  Recommendations Recommended Consults: Consider esophageal assessment Oral Care Recommendations: Oral care BID    Recommendations for follow up therapy are one component of a multi-disciplinary discharge planning process, led by the attending physician.  Recommendations may be updated based on patient status, additional functional criteria and insurance authorization.  Follow up Recommendations        Assistance Recommended at Discharge    Functional Status Assessment    Frequency and Duration            Prognosis Prognosis for improved oropharyngeal function: Fair      Swallow Study   General Date of Onset: 07/12/23 HPI: Patient is 89 yo female admitted on 07/12/23 for acute respioratory failure with hypoxia. PMH significant for HTN, hyperlipidemia, hypothyroidism, CKD 3, chronic systolic CHF, CAD, atrial fibrillation, and anemia.Pt has a hx of GERD with two previous esophageal barium studies (one in 2018 and one in 2013), both indicate a moderate reflux. Per family reports, the pt takes medication for reflux. Type of Study: Bedside Swallow Evaluation Previous Swallow Assessment: BSE on 03/08/2017, no ST needs at that time. GI concerns indicated. Diet Prior to this Study:  Regular;Thin liquids (Level 0);Other (Comment) (Per nursing reports, the pt was on reg/thin until she had a choking episode where she was made NPO following that) Temperature Spikes Noted: No Respiratory Status: Nasal cannula (10L of O2) History of Recent Intubation: No Behavior/Cognition: Cooperative;Pleasant mood;Distractible Oral Cavity Assessment: Within Functional Limits Oral Care Completed by SLP: No Oral Cavity - Dentition: Dentures, top;Dentures, bottom Vision: Functional for self-feeding Self-Feeding Abilities: Able to feed self Patient Positioning: Upright in chair Baseline Vocal Quality: Aphonic;Low vocal intensity;Other (comment) (Per pt and family reports, the pt's baseline vocal quality is intermittently aphonic with constant low vocal intensity) Volitional Cough: Weak Volitional Swallow: Able to elicit    Oral/Motor/Sensory Function Overall Oral Motor/Sensory Function: Within functional limits   Ice Chips Ice chips: Impaired Presentation: Spoon Oral Phase Functional Implications: Oral holding;Prolonged oral transit Pharyngeal Phase Impairments: Throat Clearing - Immediate;Suspected delayed Swallow   Thin Liquid Thin Liquid: Impaired Presentation: Cup;Self Fed;Spoon;Straw Oral Phase Functional Implications: Oral holding Pharyngeal  Phase Impairments: Suspected delayed Swallow;Decreased hyoid-laryngeal movement;Throat Clearing - Immediate;Throat Clearing - Delayed;Cough - Delayed    Nectar Thick Nectar Thick Liquid: Within functional limits Presentation: Cup;Spoon;Straw;Self Fed   Honey Thick Honey Thick Liquid: Not tested   Puree Puree: Within functional limits   Solid     Solid: Impaired Presentation: Self Fed;Spoon Oral Phase Impairments: Reduced lingual movement/coordination Oral Phase Functional Implications: Oral residue;Prolonged oral transit     Heidi Llamas  [place]. CCC-SLP

## 2023-07-13 NOTE — Progress Notes (Signed)
 Echocardiogram 2D Echocardiogram has been performed.  Julie Lara RDCS 07/13/2023, 2:29 PM

## 2023-07-13 NOTE — OR Nursing (Signed)
 Nursing swallow eval. Complete. While drinking water, patient began coughing and wheezing.

## 2023-07-13 NOTE — Evaluation (Signed)
 Physical Therapy Evaluation Patient Details Name: Julie Lara MRN: [id number] DOB: [birth year] Today's Date: 07/13/2023  History of Present Illness  Patient is 89 yo female admitted on 07/12/23 for acute respioratory failure with hypoxia. PMH significant for HTN, hyperlipidemia, hypothyroidism, CKD 3, chronic systolic CHF, CAD, atrial fibrillation, and anemia.  Clinical Impression  At baseline, patient lives with her daughter in one level home with level entry. She ambulates household distances modI with standard walker and requires assist for tub/shower transfers and car transfers. Patient presents today with B LE weakness (R>L), impaired activity tolerance, impaired functional mobility, B LE edema, and seated/standing balance deficits. Patient was tachycardic at rest, 119 bpm and currently on 10L/min via high flow Onset. She tolerated EOB activity for 8 minutes with CGA reporting some shortness of breath during activity. Completed stand pivot transfer with 2WW form EOB to recliner with minA. She required max cues for safety with 2WW as she is used to using a standard walker at baseline. O2 remained > 95% spO2 and HR 120-140s bpm with activity. Patient left seated upright in chair with family present. Patient and family are motivated to participate in PT and return to PLOF. Patient will benefit from skilled acute PT services to address the above impairments. Recommend return home with 24/7 supervision/assist from family and HHPT services.      If plan is discharge home, recommend the following: Assistance with cooking/housework;A little help with bathing/dressing/bathroom;A little help with walking and/or transfers   Can travel by private vehicle        Equipment Recommendations None recommended by PT;Other (comment) (Patient uses standard walker.)  Recommendations for Other Services       Functional Status Assessment Patient has had a recent decline in their functional status and  demonstrates the ability to make significant improvements in function in a reasonable and predictable amount of time.     Precautions / Restrictions Precautions Precautions: Fall;Other (comment) Precaution/Restrictions Comments: monitor HR with activity Restrictions Weight Bearing Restrictions Per Provider Order: No      Mobility  Bed Mobility Overal bed mobility: Needs Assistance Bed Mobility: Supine to Sit     Supine to sit: Min assist, HOB elevated          Transfers Overall transfer level: Needs assistance Equipment used: Rolling walker (2 wheels) Transfers: Sit to/from Stand, Bed to chair/wheelchair/BSC Sit to Stand: Min assist Stand pivot transfers: Min assist              Ambulation/Gait               General Gait Details: Gait not assessed 2/2 tachycardic at rest and 140s bpm with activity   Modified Rankin (Stroke Patients Only)       Balance Overall balance assessment: Needs assistance Sitting-balance support: No upper extremity supported, Feet supported Sitting balance-Leahy Scale: Fair Sitting balance - Comments: Seated EOB for 8 minutes with CGA.   Standing balance support: Bilateral upper extremity supported Standing balance-Leahy Scale: Poor Standing balance comment: requires walker support to maintain balance                             Pertinent Vitals/Pain Pain Assessment Pain Assessment: No/denies pain    Home Living Family/patient expects to be discharged to:: Private residence Living Arrangements: Other (Comment) (daughter) Available Help at Discharge: Family;Available 24 hours/day Type of Home: House Home Access: Level entry       Home Layout: One level  Home Equipment: Standard Walker;Tub bench;Toilet riser      Prior Function Prior Level of Function : Other (comment)             Mobility Comments: No falls in the last year. Ambulates household distances with standard walker modI. Assist for shower  transfers.       Extremity/Trunk Assessment   Upper Extremity Assessment Upper Extremity Assessment: Defer to OT evaluation    Lower Extremity Assessment Lower Extremity Assessment: Overall WFL for tasks assessed (grossly 3+/5 MMT strength R LE and 4-/5 MMT strength L LE)    Cervical / Trunk Assessment Cervical / Trunk Assessment: Kyphotic  Communication   Communication Communication: No apparent difficulties Factors Affecting Communication: Hearing impaired    Cognition Arousal: Alert Behavior During Therapy: WFL for tasks assessed/performed   PT - Cognitive impairments: No apparent impairments                         Following commands: Intact (HOH)       Cueing Cueing Techniques: Verbal cues, Gestural cues, Tactile cues     General Comments General comments (skin integrity, edema, etc.): B LE edema present; skin redness and poor circulation at bilateral forefoot    Exercises     Assessment/Plan    PT Assessment Patient needs continued PT services  PT Problem List Decreased strength;Decreased activity tolerance;Decreased balance;Decreased mobility       PT Treatment Interventions DME instruction;Neuromuscular re-education;Gait training;Patient/family education;Functional mobility training;Therapeutic activities;Therapeutic exercise;Balance training    PT Goals (Current goals can be found in the Care Plan section)  Acute Rehab PT Goals Patient Stated Goal: to be able to walk and return home PT Goal Formulation: With patient Time For Goal Achievement: 07/27/23 Potential to Achieve Goals: Good    Frequency Min 3X/week        AM-PAC PT "6 Clicks" Mobility  Outcome Measure Help needed turning from your back to your side while in a flat bed without using bedrails?: A Little Help needed moving from lying on your back to sitting on the side of a flat bed without using bedrails?: A Little Help needed moving to and from a bed to a chair (including a  wheelchair)?: A Little Help needed standing up from a chair using your arms (e.g., wheelchair or bedside chair)?: A Little Help needed to walk in hospital room?: A Lot Help needed climbing 3-5 steps with a railing? : Total 6 Click Score: 15    End of Session Equipment Utilized During Treatment: Gait belt;Oxygen Activity Tolerance: Patient tolerated treatment well;Other (comment) (HR up to 140s bpm with activity) Patient left: in chair;with chair alarm set;with family/visitor present Nurse Communication: Mobility status (vitals with activity) PT Visit Diagnosis: Unsteadiness on feet (R26.81);Other abnormalities of gait and mobility (R26.89);Muscle weakness (generalized) (M62.81)    Time: 4782-9562 PT Time Calculation (min) (ACUTE ONLY): 38 min   Charges:   PT Evaluation $PT Eval Low Complexity: 1 Low PT Treatments $Therapeutic Activity: 8-22 mins           Mariano Shiver, PT, DPT  Magon Croson H Latessa Tillis 07/13/2023, 11:42 AM

## 2023-07-14 DIAGNOSIS — R410 Disorientation, unspecified: Secondary | ICD-10-CM | POA: Diagnosis not present

## 2023-07-14 DIAGNOSIS — Z515 Encounter for palliative care: Secondary | ICD-10-CM | POA: Diagnosis not present

## 2023-07-14 DIAGNOSIS — J9601 Acute respiratory failure with hypoxia: Secondary | ICD-10-CM | POA: Diagnosis not present

## 2023-07-14 DIAGNOSIS — Z7189 Other specified counseling: Secondary | ICD-10-CM | POA: Diagnosis not present

## 2023-07-14 LAB — GLUCOSE, CAPILLARY: Glucose-Capillary: 109 mg/dL — ABNORMAL HIGH (ref 70–99)

## 2023-07-14 LAB — BASIC METABOLIC PANEL WITH GFR
Anion gap: 12 (ref 5–15)
BUN: 31 mg/dL — ABNORMAL HIGH (ref 8–23)
CO2: 29 mmol/L (ref 22–32)
Calcium: 8.9 mg/dL (ref 8.9–10.3)
Chloride: 99 mmol/L (ref 98–111)
Creatinine, Ser: 1.31 mg/dL — ABNORMAL HIGH (ref 0.44–1.00)
GFR, Estimated: 36 mL/min — ABNORMAL LOW (ref >60)
Glucose, Bld: 99 mg/dL (ref 70–99)
Potassium: 4.3 mmol/L (ref 3.5–5.1)
Sodium: 140 mmol/L (ref 135–145)

## 2023-07-14 LAB — CBC WITH DIFFERENTIAL/PLATELET
Abs Immature Granulocytes: 0.03 10*3/uL (ref 0.00–0.07)
Basophils Absolute: 0 10*3/uL (ref 0.0–0.1)
Basophils Relative: 0 %
Eosinophils Absolute: 0 10*3/uL (ref 0.0–0.5)
Eosinophils Relative: 0 %
HCT: 34.3 % — ABNORMAL LOW (ref 36.0–46.0)
Hemoglobin: 10.7 g/dL — ABNORMAL LOW (ref 12.0–15.0)
Immature Granulocytes: 1 %
Lymphocytes Relative: 21 %
Lymphs Abs: 0.8 10*3/uL (ref 0.7–4.0)
MCH: 29.7 pg (ref 26.0–34.0)
MCHC: 31.2 g/dL (ref 30.0–36.0)
MCV: 95.3 fL (ref 80.0–100.0)
Monocytes Absolute: 0.2 10*3/uL (ref 0.1–1.0)
Monocytes Relative: 6 %
Neutro Abs: 2.7 10*3/uL (ref 1.7–7.7)
Neutrophils Relative %: 72 %
Platelets: 103 10*3/uL — ABNORMAL LOW (ref 150–400)
RBC: 3.6 MIL/uL — ABNORMAL LOW (ref 3.87–5.11)
RDW: 15.9 % — ABNORMAL HIGH (ref 11.5–15.5)
WBC: 3.7 10*3/uL — ABNORMAL LOW (ref 4.0–10.5)
nRBC: 0 % (ref 0.0–0.2)

## 2023-07-14 LAB — BRAIN NATRIURETIC PEPTIDE: B Natriuretic Peptide: 476.4 pg/mL — ABNORMAL HIGH (ref 0.0–100.0)

## 2023-07-14 LAB — PHOSPHORUS: Phosphorus: 2.9 mg/dL (ref 2.5–4.6)

## 2023-07-14 LAB — MAGNESIUM: Magnesium: 2.3 mg/dL (ref 1.7–2.4)

## 2023-07-14 MED ORDER — GUAIFENESIN-DM 100-10 MG/5ML PO SYRP
5.0000 mL | ORAL_SOLUTION | ORAL | Status: DC | PRN
  Administered 2023-07-14 – 2023-07-18 (×9): 5 mL via ORAL
  Filled 2023-07-14 (×9): qty 5

## 2023-07-14 MED ORDER — SALINE SPRAY 0.65 % NA SOLN
1.0000 | NASAL | Status: DC | PRN
  Filled 2023-07-14: qty 44

## 2023-07-14 MED ORDER — FUROSEMIDE 10 MG/ML IJ SOLN
40.0000 mg | Freq: Once | INTRAMUSCULAR | Status: AC
  Administered 2023-07-14: 40 mg via INTRAVENOUS
  Filled 2023-07-14: qty 4

## 2023-07-14 NOTE — Progress Notes (Signed)
 Daily Progress Note   Patient Name: Julie Lara       Date: 07/14/2023 DOB: 1920-04-29  Age: 89 y.o. MRN#: [id number] Attending Physician: Cala Castleman, MD Primary Care Physician: Suan Elm, MD Admit Date: 07/12/2023  Reason for Consultation/Follow-up: Establishing goals of care  Subjective: I have reviewed medical records including EPIC notes, MAR, any available advanced directives as necessary, and labs. Received report from primary RN - no acute concerns. Per RN, patient had a rough night - per her exam patient with crackles to lungs. Patient also receiving lasix but unable to urinate - RN is currently performing bladder scan and EKG has been ordered. Patient now at 5L O2 [place].  10:00 -11:45 AM Met family at bedside for scheduled family meeting and moved to [facility] waiting room. Daughter/Julie Lara, great-granddaughter/Julie Lara, daughter/Julie Lara, son/Julie Lara, granddaughter/Julie Lara, great-son in law/Julie Lara, great-granddaughter/Julie Lara, granddaughter/Julie Lara, granddaughter/Julie Lara present.  Emotional support provided to family. Therapeutic listening provided as they reflect on information provided to them this morning. Reviewed patient's interval history since admission per their request. Family have a clear understanding of patient's current acute medical situation.   Concept of human mortality and the limitations of medical interventions to prolong quality of life when the body fails to thrive was explored.  Discussed at length goals and possible medical boundaries for hospital care. Family wish to continue current limited interventions to treat the treatable. If patient declines, they are open to further discussions regarding transition to full comfort care vs ICU admission. They have not set any firm  boundaries around this, at this time.  Discussed at length anticipatory care needs for patient at discharge. Patient will likely not return to her previous baseline functional status. Family are very clear they are not interested in her placement at a facility. Family understand she will need 24/7 supervision/assistance at home. Daughter/Julie Lara is patient's primary caregiver and would need support in providing patient total care, if needed. Currently, patient is experiencing incontinence this admission, which is new - family are concerned how they will manage this at home if it does not improve. Otherwise, family feel comfortable providing 24/7 care between multiple family members.  We discussed the option of private duty caregiving. Reviewed this option in context of insurance to the best of my ability. Family understand that usually, this is an out of pocket cost. Jennett Model has  already looked into and been in contact with an organization named [facility]. Family will also call patient's insurance to inquire about any available benefits.  Provided education and counseling at length on the philosophy and benefits of hospice care. Discussed that it offers a holistic approach to care in the setting of end-stage illness, and is about supporting the patient where they are allowing nature to take it's course. Discussed the hospice team includes RNs, physicians, social workers, and chaplains. They can provide personal care, support for the family, and help keep patient out of the hospital as well as assist with DME needs for home hospice. Education provided on the difference between home vs residential hospice. Family would prefer to take patient home with hospice if this is the chosen path.  We discussed discharge options at length: return home with [facility] and outpatient Palliative Care +/- private duty caregivers vs home hospice +/- private duty caregivers.   Discussion on the difference between Palliative  and Hospice care. Palliative care and hospice have similar goals of managing symptoms, promoting comfort, improving quality of life, and maintaining a person's dignity. However, palliative care may be offered during any phase of a serious illness, while hospice care is usually offered when a person is expected to live for 6 months or less.  Family do indicate they have DME needs, including hospital bed. Education provided that pending their discharge decisions, DME requests can be place with hospice vs [facility] agency.   Family understand that patient would be at high risk for rehospitalization if discharged without hospice services. Encouraged family to consider if patient would/would not want to be rehospitalized. Explained that outpatient Palliative Care can transition patient over to hospice services if/when they were ready.  Family would be interested in speaking with [facility] liaison to gain further insight into what their Palliative Care vs hospice services could offer - this will assist family in making decisions. Great-granddaughter/Julie Lara works for [employer] and family request this organization.   Family strongly request not to use the word "hospice" in front of patient - rather telling her she would receive additional help in the home.  Hard Choices book provided to family.  All questions and concerns addressed. Encouraged to call with questions and/or concerns. PMT card previously provided.  Length of Stay: 1  Current Medications: Scheduled Meds:   aspirin  81 mg Oral QPM   atorvastatin  40 mg Oral QPM   azithromycin  500 mg Oral Daily   B-complex with vitamin C  1 tablet Oral Daily   vitamin B-12  1,000 mcg Oral Q lunch   heparin  5,000 Units Subcutaneous Q8H   levothyroxine  25 mcg Oral Q0600   methylPREDNISolone (SOLU-MEDROL) injection  60 mg Intravenous Q24H   metoprolol tartrate  25 mg Oral BID   multivitamin with minerals  1 tablet Oral Q lunch   pantoprazole (PROTONIX) IV   40 mg Intravenous Q24H   sodium chloride flush  3 mL Intravenous Q12H    Continuous Infusions:   PRN Meds: acetaminophen **OR** acetaminophen, diltiazem, guaiFENesin-dextromethorphan, ipratropium-albuterol, nitroGLYCERIN, polyethylene glycol, sodium chloride  Physical Exam Vitals and nursing note reviewed.  Constitutional:      General: She is not in acute distress.    Appearance: She is ill-appearing.  Pulmonary:     Effort: No respiratory distress.  Skin:    General: Skin is warm and dry.  Neurological:     Mental Status: She is alert.     Motor: Weakness present.  Vital Signs: BP 139/82 (BP Location: Right Arm)   Pulse 100   Temp 98.5 F (36.9 C) (Oral)   Resp (!) 32   Ht 5\' 4"  (1.626 m)   Wt 54 kg   SpO2 98%   BMI 20.43 kg/m  SpO2: SpO2: 98 % O2 Device: O2 Device: High Flow Nasal Cannula O2 Flow Rate: O2 Flow Rate (L/min): 5 L/min  Intake/output summary: No intake or output data in the 24 hours ending 07/14/23 0951 LBM:   Baseline Weight: Weight: 54 kg Most recent weight: Weight: 54 kg       Palliative Assessment/Data: PPS 50%      Patient Active Problem List   Diagnosis Date Noted   Hypoxia 07/13/2023   Disorientation 07/13/2023   Wheezing 07/13/2023   Palliative care by specialist 07/13/2023   Goals of care, counseling/discussion 07/13/2023   DNR (do not resuscitate) discussion 07/13/2023   Concern about end of life 07/13/2023   DNR (do not resuscitate) 07/13/2023   DNI (do not intubate) 07/13/2023   Acute respiratory failure with hypoxia (HCC) 07/12/2023   Essential hypertension 02/11/2017   Acute on chronic systolic CHF (congestive heart failure) (HCC) 02/01/2017   HLD (hyperlipidemia) 02/01/2017   CAD (coronary artery disease)    Benign neoplasm of colon 02/23/2013   Near syncope 02/21/2013   Anemia 02/21/2013   Chronic systolic congestive heart failure (HCC) 02/21/2013   GI bleed 02/21/2013   Chronic anticoagulation  02/21/2013   Coronary artery disease with history of myocardial infarction without history of CABG 02/21/2013   Resting chest pain 10/27/2012   Hypothyroidism 10/15/2012   Atrial fibrillation (HCC) 10/15/2012   CKD (chronic kidney disease), stage III (HCC) 10/15/2012   Dilated cardiomyopathy (HCC)    Aortic insufficiency    PAC (premature atrial contraction)     Palliative Care Assessment & Plan   Patient Profile: 89 y.o. female with past medical history of hypertension, hyperlipidemia, hypothyroidism, CKD 3, chronic systolic CHF, CAD, atrial fibrillation, anemia presented to the ED on 07/12/2023 with complaints of shortness of breath. She was recently seen in ED on 07/03/23 for same - work up was unremarkable and she was sent home.   Assessment: Principal Problem:   Acute respiratory failure with hypoxia (HCC) Active Problems:   Hypothyroidism   Atrial fibrillation (HCC)   CKD (chronic kidney disease), stage III (HCC)   Anemia   Chronic systolic congestive heart failure (HCC)   CAD (coronary artery disease)   HLD (hyperlipidemia)   Essential hypertension   Hypoxia   Disorientation   Wheezing   Palliative care by specialist   Goals of care, counseling/discussion   DNR (do not resuscitate) discussion   Concern about end of life   DNR (do not resuscitate)   DNI (do not intubate)   Recommendations/Plan: Continue to treat the treatable. In the event of decline, family open to continued discussions regarding transition to full comfort vs ICU admission Continue DNR/DNI as previously documented - durable DNR form completed and placed in shadow chart. Copy was made and will be scanned into Vynca/ACP tab Family will make stepwise decisions pending patient's clinical course Family currently deciding if they would like patient discharged home with [facility] and outpatient Palliative Care vs home with hospice Family would like to meet with [facility] liaison to discuss PC vs hospice services  - [facility] liaison notified and will meet with family tomorrow 4/14 Family also looking into private duty caregiver support for patient PMT will continue to follow and  support holistically  Goals of Care and Additional Recommendations: Limitations on Scope of Treatment: Full Scope Treatment  Code Status:    Code Status Orders  (From admission, onward)           Start     Ordered   07/12/23 1609  Do not attempt resuscitation (DNR)- Limited -Do Not Intubate (DNI)  Continuous       Question Answer Comment  If pulseless and not breathing No CPR or chest compressions.   In Pre-Arrest Conditions (Patient Is Breathing and Has A Pulse) Do not intubate. Provide all appropriate non-invasive medical interventions. Avoid ICU transfer unless indicated or required.   Consent: Discussion documented in EHR or advanced directives reviewed      07/12/23 1609           Code Status History     Date Active Date Inactive Code Status Order ID Comments User Context   07/12/2023 1521 07/12/2023 1609 Limited: Do not attempt resuscitation (DNR) -DNR-LIMITED -Do Not Intubate/DNI  [id number]  Annette Killings, NP ED   07/12/2023 1236 07/12/2023 1521 Full Code [id number]  Ninetta Basket, MD ED   03/07/2017 2102 03/09/2017 1633 Full Code [id number]  Walton Guppy, MD ED   02/01/2017 1953 02/03/2017 1814 Full Code [id number]  Fidencio Hue, MD ED   07/18/2016 0021 07/19/2016 1551 Full Code [id number]  Dea Evert, DO ED   02/21/2013 2026 02/25/2013 1732 Full Code [id number]  Pecolia Bourbon, MD ED       Prognosis:  Unable to determine  Discharge Planning: To Be Determined  Care plan was discussed with primary RN, patient's family, Dr. Zelda Hickman, [facility], [facility] liaison  Thank you for allowing the Palliative Medicine Team to assist in the care of this patient.   Total Time 125 minutes Prolonged Time Billed  yes       Annette Killings, NP  Please contact Palliative Medicine Team phone at [phone] for  questions and concerns.   *Portions of this note are a verbal dictation therefore any spelling and/or grammatical errors are due to the "[employer]agon [employer] One" system interpretation.

## 2023-07-14 NOTE — Plan of Care (Signed)
 Brief Palliative Medicine Progress Note:  Family requesting another GOC meeting - scheduled for tomorrow 4/13 at 10:00am.  Petronella Shuford M. Felipe Horton [facility] Palliative Medicine Team Team Phone: [phone] NO CHARGE

## 2023-07-14 NOTE — Care Management (Addendum)
 [id number] LVM w daughter requesting callback to discuss DC planning  11:46 Spoke w Erby Hatcher and she states they are still deciding btwn [facility] w Palliative or Home Hospice. She has my number to call me back if they decide today for referrals to be made today, if not they will follow up w TOC tomorrow.  Palliative reached out to team and included [facility] liaison for request for them to speak w family.   Julie Lara, Julie Lara (Daughter) [phone]

## 2023-07-14 NOTE — Progress Notes (Signed)
 PROGRESS NOTE                                                                                                                                                                                                             Patient Demographics:    Julie Lara, is a 89 y.o. female, DOB - September 17, 1920, ZOX:[id number]  Outpatient Primary MD for the patient is Suan Elm, MD    LOS - 1  Admit date - 07/12/2023    Chief Complaint  Patient presents with   Shortness of Breath   Weakness       Brief Narrative (HPI from H&P)    89 y.o. female with medical history significant of hypertension, hyperlipidemia, hypothyroidism, CKD 3, chronic systolic CHF, CAD, atrial fibrillation, anemia presenting with worsening shortness of breath.  Workup suggestive of acute hypoxic respiratory failure due to metapneumovirus pNA.   Subjective:   Seen in bed denies any headache, no chest pain but cough and shortness of breath, no focal weakness.   Assessment  & Plan :    Acute respiratory failure with hypoxia due to metapneumovirus pneumonia.  Patient fairly hypoxic, continue supplemental oxygen 5 to 6 L, BiPAP if needed, placed on IV steroids along with oral azithromycin, likely has mild superimposed bacterial component to her pneumonia as well.  Encouraged to sit in chair use I-S and flutter valve for pulmonary toiletry, monitor closely.  Overall tenuous.  Speech to also evaluate, plan discussed with RT and RN.  SpO2: 95 % O2 Flow Rate (L/min): 5 L/min FiO2 (%): 50 %   Prolonged QTc > QTc 500 on EKG.   - Avoid QTc prolonging medications -Keep electrolytes stable, renew low-dose beta-blocker, repeat EKG 07/14/2023.   Hypertension - Continue beta-blocker as tolerated by blood pressure   Hyperlipidemia - Continue home atorvastatin   Hypothyroidism - Continue home Synthroid stable TSH.   CKD 3 B > Creatinine slight around 1.4 -  Trend renal function and electrolytes   Chronic systolic CHF EF 45% to 50%. > Has some acute decompensation on 07/14/2023, IV Lasix, low-dose beta-blocker, dose Lasix daily continue low-dose beta-blocker.  Renal insufficiency precludes the use of ACE/ARB/Entresto.   CAD > Status post PCI per chart - Continue home ASA, metoprolol, atorvastatin   Paroxysmal atrial fibrillation with RVR > Currently sinus rhythm. - Continue home metoprolol, as  needed IV Cardizem - Not on any anticoagulation   Thrombocytopenia > Stable at 92 - Trend CBC      Hypoglycemia > Noted to have some hypoglycemia by EMS.  Responded to D10 now hyperglycemic. - Trend CBGs   CBG (last 3)  Recent Labs    07/13/23 1659 07/13/23 2348 07/14/23 0838  GLUCAP 157* 96 109*         Condition - Extremely Guarded  Family Communication  :  daughter bedside 07/13/23  Code Status :  DNR  Consults  :  None  PUD Prophylaxis : PPI   Procedures  :     CT -  Marked cardiomegaly. Coronary artery disease. 4.7 cm ascending thoracic aortic aneurysm. Recommend semi-annual imaging followup by CTA or MRA and referral to cardiothoracic surgery if not already obtained.  TTE - 1. Left ventricular ejection fraction, by estimation, is 45 to 50%. The left ventricle has mildly decreased function. The left ventricle has no regional wall motion abnormalities. There is mild concentric left ventricular hypertrophy. Left ventricular diastolic parameters are indeterminate.  2. Right ventricular systolic function is mildly reduced. The right ventricular size is severely enlarged. There is moderately elevated pulmonary artery systolic pressure. The estimated right ventricular systolic pressure is 46.4 mmHg.  3. Left atrial size was severely dilated.  4. Right atrial size was severely dilated.  5. The mitral valve is normal in structure. Mild mitral valve regurgitation. No evidence of mitral stenosis.  6. Severe, secondary regurgitation with  coaptation gap of 0.7 cm. The tricuspid valve is abnormal. Tricuspid valve regurgitation is severe.  7. Severe, central, aortic regurgitation with 2D vena contracta of 0.8 cm and holodiastolic reversal in the descending aortic arch. The aortic valve is tricuspid. Aortic valve regurgitation is severe. Aortic valve sclerosis/calcification is present, without any evidence of aortic stenosis.  8. Pulmonic valve regurgitation is moderate.  9. Aortic dilatation noted. There is mild dilatation of the aortic root, measuring 41 mm. There is mild dilatation of the ascending aorta, measuring 45 mm. 10. The inferior vena cava is dilated in size with <50% respiratory variability, suggesting right atrial pressure of 15 mmHg.      Disposition Plan  :    Status is: Observation   DVT Prophylaxis  :    heparin injection 5,000 Units Start: 07/12/23 1615    Lab Results  Component Value Date   PLT 103 (L) 07/14/2023    Diet :  Diet Order             DIET DYS 3 Room service appropriate? Yes; Fluid consistency: Nectar Thick  Diet effective now                    Inpatient Medications  Scheduled Meds:  aspirin  81 mg Oral QPM   atorvastatin  40 mg Oral QPM   azithromycin  500 mg Oral Daily   B-complex with vitamin C  1 tablet Oral Daily   vitamin B-12  1,000 mcg Oral Q lunch   heparin  5,000 Units Subcutaneous Q8H   levothyroxine  25 mcg Oral Q0600   methylPREDNISolone (SOLU-MEDROL) injection  60 mg Intravenous Q24H   metoprolol tartrate  25 mg Oral BID   multivitamin with minerals  1 tablet Oral Q lunch   pantoprazole (PROTONIX) IV  40 mg Intravenous Q24H   sodium chloride flush  3 mL Intravenous Q12H   Continuous Infusions: PRN Meds:.acetaminophen **OR** acetaminophen, diltiazem, guaiFENesin-dextromethorphan, ipratropium-albuterol, nitroGLYCERIN, polyethylene glycol, sodium chloride  Objective:   Vitals:   07/14/23 0000 07/14/23 0038 07/14/23 0400 07/14/23 0750  BP: 123/76   128/88   Pulse: 88  80 (!) 105  Resp: (!) 28  (!) 24 (!) 28  Temp: 98 F (36.7 C)     TempSrc: Oral     SpO2: 98% 99% 97% 95%  Weight:      Height:        Wt Readings from Last 3 Encounters:  07/12/23 54 kg  07/08/23 54 kg  07/31/22 56 kg    No intake or output data in the 24 hours ending 07/14/23 0852    Physical Exam  Awake Alert, No new F.N deficits, Normal affect [place].AT,PERRAL Supple Neck, No JVD,   Symmetrical Chest wall movement, Good air movement bilaterally, diffuse wheezing and crackles RRR,No Gallops,Rubs or new Murmurs,  +ve B.Sounds, Abd Soft, No tenderness,   No Cyanosis, Clubbing or edema       Data Review:    Recent Labs  Lab 07/08/23 2032 07/12/23 1220 07/12/23 1511 07/13/23 0612 07/14/23 0746  WBC 4.5 5.3  --  5.1 3.7*  HGB 11.8* 12.1 11.6* 10.8* 10.7*  HCT 38.1 38.5 34.0* 34.1* 34.3*  PLT 92* 92*  --  94* 103*  MCV 97.7 97.0  --  95.3 95.3  MCH 30.3 30.5  --  30.2 29.7  MCHC 31.0 31.4  --  31.7 31.2  RDW 16.2* 16.0*  --  16.0* 15.9*  LYMPHSABS 2.2  --   --   --  0.8  MONOABS 0.9  --   --   --  0.2  EOSABS 0.0  --   --   --  0.0  BASOSABS 0.0  --   --   --  0.0    Recent Labs  Lab 07/08/23 2032 07/12/23 1220 07/12/23 1511 07/12/23 1631 07/13/23 0612 07/14/23 0746  NA 142 139 141  --  143 140  K 3.6 3.9 3.6  --  4.1 4.3  CL 100 99  --   --  99 99  CO2 29 27  --   --  31 29  ANIONGAP 13 13  --   --  13 12  GLUCOSE 120* 278*  --   --  117* 99  BUN 22 29*  --   --  26* 31*  CREATININE 1.32* 1.26*  --   --  1.26* 1.31*  AST 33 62*  --   --  36  --   ALT 22 29  --   --  24  --   ALKPHOS 96 90  --   --  81  --   BILITOT 1.0 1.1  --   --  0.8  --   ALBUMIN 3.5 3.2*  --   --  3.0*  --   CRP  --   --   --   --  1.5*  --   PROCALCITON  --   --   --  <0.10 <0.10  --   TSH  --   --   --   --  0.509  --   BNP 497.8* 613.3*  --   --  498.4*  --   MG  --   --   --   --  2.1 2.3  PHOS  --   --   --   --   --  2.9  CALCIUM 9.2 8.8*  --    --  8.8* 8.9  Recent Labs  Lab 07/08/23 2032 07/12/23 1220 07/12/23 1631 07/13/23 0612 07/14/23 0746  CRP  --   --   --  1.5*  --   PROCALCITON  --   --  <0.10 <0.10  --   TSH  --   --   --  0.509  --   BNP 497.8* 613.3*  --  498.4*  --   MG  --   --   --  2.1 2.3  CALCIUM 9.2 8.8*  --  8.8* 8.9    --------------------------------------------------------------------------------------------------------------- Lab Results  Component Value Date   CHOL 146 12/15/2012   HDL 64.10 12/15/2012   LDLCALC 71 12/15/2012   TRIG 54.0 12/15/2012   CHOLHDL 2 12/15/2012      Micro Results Recent Results (from the past 240 hours)  Resp panel by RT-PCR (RSV, Flu A&B, Covid) Anterior Nasal Swab     Status: None   Collection Time: 07/08/23  8:37 PM   Specimen: Anterior Nasal Swab  Result Value Ref Range Status   SARS Coronavirus 2 by RT PCR NEGATIVE NEGATIVE Final   Influenza A by PCR NEGATIVE NEGATIVE Final   Influenza B by PCR NEGATIVE NEGATIVE Final    Comment: (NOTE) The Xpert Xpress SARS-CoV-2/FLU/RSV plus assay is intended as an aid in the diagnosis of influenza from Nasopharyngeal swab specimens and should not be used as a sole basis for treatment. Nasal washings and aspirates are unacceptable for Xpert Xpress SARS-CoV-2/FLU/RSV testing.  Fact Sheet for Patients: BloggerCourse.com  Fact Sheet for Healthcare Providers: SeriousBroker.it  This test is not yet approved or cleared by the [place]  FDA and has been authorized for detection and/or diagnosis of SARS-CoV-2 by FDA under an Emergency Use Authorization (EUA). This EUA will remain in effect (meaning this test can be used) for the duration of the COVID-19 declaration under Section 564(b)(1) of the Act, 21 U.S.C. section 360bbb-3(b)(1), unless the authorization is terminated or revoked.     Resp Syncytial Virus by PCR NEGATIVE NEGATIVE Final    Comment:  (NOTE) Fact Sheet for Patients: BloggerCourse.com  Fact Sheet for Healthcare Providers: SeriousBroker.it  This test is not yet approved or cleared by the [place]  FDA and has been authorized for detection and/or diagnosis of SARS-CoV-2 by FDA under an Emergency Use Authorization (EUA). This EUA will remain in effect (meaning this test can be used) for the duration of the COVID-19 declaration under Section 564(b)(1) of the Act, 21 U.S.C. section 360bbb-3(b)(1), unless the authorization is terminated or revoked.  Performed at [facility], [address], [place], [place] [address]   Respiratory (~20 pathogens) panel by PCR     Status: Abnormal   Collection Time: 07/12/23  4:09 PM   Specimen: Nasopharyngeal Swab; Respiratory  Result Value Ref Range Status   Adenovirus NOT DETECTED NOT DETECTED Final   Coronavirus 229E NOT DETECTED NOT DETECTED Final    Comment: (NOTE) The Coronavirus on the Respiratory Panel, DOES NOT test for the novel  Coronavirus (2019 nCoV)    Coronavirus HKU1 NOT DETECTED NOT DETECTED Final   Coronavirus NL63 NOT DETECTED NOT DETECTED Final   Coronavirus OC43 NOT DETECTED NOT DETECTED Final   Metapneumovirus DETECTED (A) NOT DETECTED Final   Rhinovirus / Enterovirus NOT DETECTED NOT DETECTED Final   Influenza A NOT DETECTED NOT DETECTED Final   Influenza B NOT DETECTED NOT DETECTED Final   Parainfluenza Virus 1 NOT DETECTED NOT DETECTED Final   Parainfluenza Virus 2 NOT DETECTED NOT  DETECTED Final   Parainfluenza Virus 3 NOT DETECTED NOT DETECTED Final   Parainfluenza Virus 4 NOT DETECTED NOT DETECTED Final   Respiratory Syncytial Virus NOT DETECTED NOT DETECTED Final   Bordetella pertussis NOT DETECTED NOT DETECTED Final   Bordetella Parapertussis NOT DETECTED NOT DETECTED Final   Chlamydophila pneumoniae NOT DETECTED NOT DETECTED Final   Mycoplasma pneumoniae NOT DETECTED NOT DETECTED Final     Comment: Performed at [facility], [address], [place], [place] [address]  Blood culture (routine x 2)     Status: None (Preliminary result)   Collection Time: 07/12/23  4:51 PM   Specimen: BLOOD RIGHT FOREARM  Result Value Ref Range Status   Specimen Description BLOOD RIGHT FOREARM  Final   Special Requests   Final    BOTTLES DRAWN AEROBIC AND ANAEROBIC Blood Culture results may not be optimal due to an inadequate volume of blood received in culture bottles   Culture   Final    NO GROWTH 2 DAYS Performed at [facility], [address], [place], [place] [address]    Report Status PENDING  Incomplete  Blood culture (routine x 2)     Status: None (Preliminary result)   Collection Time: 07/12/23  8:56 PM   Specimen: BLOOD LEFT HAND  Result Value Ref Range Status   Specimen Description BLOOD LEFT HAND  Final   Special Requests   Final    BOTTLES DRAWN AEROBIC ONLY Blood Culture results may not be optimal due to an inadequate volume of blood received in culture bottles   Culture   Final    NO GROWTH 2 DAYS Performed at [facility], [address], [place], [place] [address]    Report Status PENDING  Incomplete    Radiology Report ECHOCARDIOGRAM COMPLETE Result Date: 07/13/2023    ECHOCARDIOGRAM REPORT   Patient Name:   TANAYA DUNIGAN Date of Exam: 07/13/2023 Medical Rec #:  [id number]       Height:       64.0 in Accession #:    [id number]      Weight:       119.0 lb Date of Birth:  01-21-1921       BSA:          1.569 m Patient Age:    89 years       BP:           121/80 mmHg Patient Gender: F               HR:           91 bpm. Exam Location:  Inpatient Procedure: 2D Echo, Color Doppler and Cardiac Doppler (Both Spectral and Color            Flow Doppler were utilized during procedure). Indications:    Acute Respiratory Distress R06.03  History:        Patient has prior history of Echocardiogram examinations, most                 recent 02/02/2017. CAD,  Arrythmias:Atrial Fibrillation; Risk                 Factors:Hypertension and Dyslipidemia.  Sonographer:    Sherline Distel Senior RDCS Referring Phys: [id number] Gayl Katos MELVIN IMPRESSIONS  1. Left ventricular ejection fraction, by estimation, is 45 to 50%. The left ventricle has mildly decreased function. The left ventricle has no regional wall motion abnormalities. There is mild concentric left  ventricular hypertrophy. Left ventricular diastolic parameters are indeterminate.  2. Right ventricular systolic function is mildly reduced. The right ventricular size is severely enlarged. There is moderately elevated pulmonary artery systolic pressure. The estimated right ventricular systolic pressure is 46.4 mmHg.  3. Left atrial size was severely dilated.  4. Right atrial size was severely dilated.  5. The mitral valve is normal in structure. Mild mitral valve regurgitation. No evidence of mitral stenosis.  6. Severe, secondary regurgitation with coaptation gap of 0.7 cm. The tricuspid valve is abnormal. Tricuspid valve regurgitation is severe.  7. Severe, central, aortic regurgitation with 2D vena contracta of 0.8 cm and holodiastolic reversal in the descending aortic arch. The aortic valve is tricuspid. Aortic valve regurgitation is severe. Aortic valve sclerosis/calcification is present, without any evidence of aortic stenosis.  8. Pulmonic valve regurgitation is moderate.  9. Aortic dilatation noted. There is mild dilatation of the aortic root, measuring 41 mm. There is mild dilatation of the ascending aorta, measuring 45 mm. 10. The inferior vena cava is dilated in size with <50% respiratory variability, suggesting right atrial pressure of 15 mmHg. Comparison(s): Prior images reviewed side by side. Further progression of her aortic regurgitation from imaging done before age 89. FINDINGS  Left Ventricle: Left ventricular ejection fraction, by estimation, is 45 to 50%. The left ventricle has mildly decreased function. The  left ventricle has no regional wall motion abnormalities. Strain was performed and the global longitudinal strain is indeterminate. The left ventricular internal cavity size was normal in size. There is mild concentric left ventricular hypertrophy. Left ventricular diastolic function could not be evaluated due to atrial fibrillation. Left ventricular diastolic parameters are indeterminate. Right Ventricle: The right ventricular size is severely enlarged. No increase in right ventricular wall thickness. Right ventricular systolic function is mildly reduced. There is moderately elevated pulmonary artery systolic pressure. The tricuspid regurgitant velocity is 2.80 m/s, and with an assumed right atrial pressure of 15 mmHg, the estimated right ventricular systolic pressure is 46.4 mmHg. Left Atrium: Left atrial size was severely dilated. Right Atrium: Right atrial size was severely dilated. Pericardium: There is no evidence of pericardial effusion. Mitral Valve: The mitral valve is normal in structure. Mild mitral valve regurgitation. No evidence of mitral valve stenosis. Tricuspid Valve: Severe, secondary regurgitation with coaptation gap of 0.7 cm. The tricuspid valve is abnormal. Tricuspid valve regurgitation is severe. No evidence of tricuspid stenosis. Aortic Valve: Severe, central, aortic regurgitation with 2D vena contracta of 0.8 cm and holodiastolic reversal in the descending aortic arch. The aortic valve is tricuspid. There is mild aortic valve annular calcification. Aortic valve regurgitation is severe. Aortic valve sclerosis/calcification is present, without any evidence of aortic stenosis. Pulmonic Valve: The pulmonic valve was normal in structure. Pulmonic valve regurgitation is moderate. No evidence of pulmonic stenosis. Aorta: Aortic dilatation noted. There is mild dilatation of the aortic root, measuring 41 mm. There is mild dilatation of the ascending aorta, measuring 45 mm. Venous: The inferior vena  cava is dilated in size with less than 50% respiratory variability, suggesting right atrial pressure of 15 mmHg. IAS/Shunts: No atrial level shunt detected by color flow Doppler. Additional Comments: 3D was performed not requiring image post processing on an independent workstation and was indeterminate.  LEFT VENTRICLE PLAX 2D LVIDd:         4.70 cm LVIDs:         3.20 cm LV PW:         1.20 cm LV IVS:  1.10 cm LVOT diam:     2.10 cm LV SV:         47 LV SV Index:   30 LVOT Area:     3.46 cm  RIGHT VENTRICLE RV S prime:     10.20 cm/s TAPSE (M-mode): 1.4 cm LEFT ATRIUM              Index         RIGHT ATRIUM           Index LA diam:        5.10 cm  3.25 cm/m    RA Area:     39.50 cm LA Vol (A2C):   162.0 ml 103.23 ml/m  RA Volume:   152.00 ml 96.86 ml/m LA Vol (A4C):   141.0 ml 89.85 ml/m LA Biplane Vol: 159.0 ml 101.32 ml/m  AORTIC VALVE LVOT Vmax:         73.90 cm/s LVOT Vmean:        53.700 cm/s LVOT VTI:          0.137 m AR Vena Contracta: 0.80 cm  AORTA Ao Root diam: 4.10 cm Ao Asc diam:  4.50 cm TRICUSPID VALVE TR Peak grad:   31.4 mmHg TR Vmax:        280.00 cm/s  SHUNTS Systemic VTI:  0.14 m Systemic Diam: 2.10 cm Gloriann Larger MD Electronically signed by Gloriann Larger MD Signature Date/Time: 07/13/2023/3:11:01 PM    Final    DG Chest Port 1 View Result Date: 07/13/2023 CLINICAL DATA:  Shortness of breath. EXAM: PORTABLE CHEST 1 VIEW COMPARISON:  07/12/2023 FINDINGS: The cardio pericardial silhouette is enlarged. There is pulmonary vascular congestion without overt pulmonary edema. Retrocardiac opacity is compatible with atelectasis seen on CT imaging performed 4 hours prior. No evidence for substantial pleural effusion. Bones are diffusely demineralized. Telemetry leads overlie the chest. IMPRESSION: 1. Enlargement of the cardiopericardial silhouette with pulmonary vascular congestion. 2. Retrocardiac opacity compatible with atelectasis seen on CT imaging performed 4 hours  prior. Electronically Signed   By: Donnal Fusi M.D.   On: 07/13/2023 06:30   CT CHEST WO CONTRAST Result Date: 07/13/2023 CLINICAL DATA:  Respiratory illness, nondiagnostic xray. Acute respiratory failure with hypoxia. Shortness of breath. EXAM: CT CHEST WITHOUT CONTRAST TECHNIQUE: Multidetector CT imaging of the chest was performed following the standard protocol without IV contrast. RADIATION DOSE REDUCTION: This exam was performed according to the departmental dose-optimization program which includes automated exposure control, adjustment of the mA and/or kV according to patient size and/or use of iterative reconstruction technique. COMPARISON:  Chest x-ray 07/12/2023. FINDINGS: Cardiovascular: Marked cardiomegaly. Coronary artery and aortic atherosclerosis. Aneurysmal dilatation of the ascending thoracic aorta measuring up to 4.7 cm. Mediastinum/Nodes: No mediastinal, hilar, or axillary adenopathy. Trachea and esophagus are unremarkable. Thyroid unremarkable. Lungs/Pleura: Areas of atelectasis in the lungs bilaterally adjacent to the markedly enlarged heart. No pleural effusions or confluent airspace opacities. Mild peribronchial thickening. Upper Abdomen: No acute findings Musculoskeletal: Chest wall soft tissues are unremarkable. No acute bony abnormality. IMPRESSION: Marked cardiomegaly. Coronary artery disease. 4.7 cm ascending thoracic aortic aneurysm. Recommend semi-annual imaging followup by CTA or MRA and referral to cardiothoracic surgery if not already obtained. This recommendation follows 2010 ACCF/AHA/AATS/ACR/ASA/SCA/SCAI/SIR/STS/SVM Guidelines for the Diagnosis and Management of Patients With Thoracic Aortic Disease. Circulation. 2010; 121: V409-W119. Aortic aneurysm NOS (ICD10-I71.9) Aortic Atherosclerosis (ICD10-I70.0). Electronically Signed   By: Janeece Mechanic M.D.   On: 07/13/2023 02:27   DG Chest [facility] 1 View Result  Date: 07/12/2023 CLINICAL DATA:  Shortness of breath EXAM: PORTABLE  CHEST 1 VIEW COMPARISON:  Chest radiograph dated 07/08/2023 FINDINGS: Normal lung volumes. No focal consolidations. No pleural effusion or pneumothorax. Similar markedly enlarged cardiomediastinal silhouette. No acute osseous abnormality. IMPRESSION: 1. Similar marked cardiomegaly. 2. No focal consolidations. Electronically Signed   By: Limin  Xu M.D.   On: 07/12/2023 13:48     Signature  -   Lynnwood Sauer M.D on 07/14/2023 at 8:52 AM   -  To page go to [email or web address]

## 2023-07-14 NOTE — Plan of Care (Signed)
 progressing

## 2023-07-15 ENCOUNTER — Inpatient Hospital Stay (HOSPITAL_COMMUNITY)

## 2023-07-15 DIAGNOSIS — J9601 Acute respiratory failure with hypoxia: Secondary | ICD-10-CM | POA: Diagnosis not present

## 2023-07-15 LAB — PROCALCITONIN: Procalcitonin: <0.1 ng/mL

## 2023-07-15 LAB — GLUCOSE, CAPILLARY
Glucose-Capillary: 158 mg/dL — ABNORMAL HIGH (ref 70–99)
Glucose-Capillary: 173 mg/dL — ABNORMAL HIGH (ref 70–99)

## 2023-07-15 LAB — CBC WITH DIFFERENTIAL/PLATELET
Abs Immature Granulocytes: 0.03 10*3/uL (ref 0.00–0.07)
Basophils Absolute: 0 10*3/uL (ref 0.0–0.1)
Basophils Relative: 0 %
Eosinophils Absolute: 0 10*3/uL (ref 0.0–0.5)
Eosinophils Relative: 0 %
HCT: 31.9 % — ABNORMAL LOW (ref 36.0–46.0)
Hemoglobin: 10.2 g/dL — ABNORMAL LOW (ref 12.0–15.0)
Immature Granulocytes: 1 %
Lymphocytes Relative: 23 %
Lymphs Abs: 0.9 10*3/uL (ref 0.7–4.0)
MCH: 30.6 pg (ref 26.0–34.0)
MCHC: 32 g/dL (ref 30.0–36.0)
MCV: 95.8 fL (ref 80.0–100.0)
Monocytes Absolute: 0.4 10*3/uL (ref 0.1–1.0)
Monocytes Relative: 11 %
Neutro Abs: 2.6 10*3/uL (ref 1.7–7.7)
Neutrophils Relative %: 65 %
Platelets: 104 10*3/uL — ABNORMAL LOW (ref 150–400)
RBC: 3.33 MIL/uL — ABNORMAL LOW (ref 3.87–5.11)
RDW: 15.6 % — ABNORMAL HIGH (ref 11.5–15.5)
WBC: 4 10*3/uL (ref 4.0–10.5)
nRBC: 0 % (ref 0.0–0.2)

## 2023-07-15 LAB — BASIC METABOLIC PANEL WITH GFR
Anion gap: 11 (ref 5–15)
BUN: 37 mg/dL — ABNORMAL HIGH (ref 8–23)
CO2: 28 mmol/L (ref 22–32)
Calcium: 8.8 mg/dL — ABNORMAL LOW (ref 8.9–10.3)
Chloride: 100 mmol/L (ref 98–111)
Creatinine, Ser: 1.43 mg/dL — ABNORMAL HIGH (ref 0.44–1.00)
GFR, Estimated: 33 mL/min — ABNORMAL LOW (ref >60)
Glucose, Bld: 114 mg/dL — ABNORMAL HIGH (ref 70–99)
Potassium: 4.2 mmol/L (ref 3.5–5.1)
Sodium: 139 mmol/L (ref 135–145)

## 2023-07-15 LAB — C-REACTIVE PROTEIN: CRP: 0.7 mg/dL (ref <1.0)

## 2023-07-15 LAB — MAGNESIUM: Magnesium: 2.2 mg/dL (ref 1.7–2.4)

## 2023-07-15 LAB — BRAIN NATRIURETIC PEPTIDE: B Natriuretic Peptide: 793.6 pg/mL — ABNORMAL HIGH (ref 0.0–100.0)

## 2023-07-15 MED ORDER — SPIRONOLACTONE 25 MG PO TABS
25.0000 mg | ORAL_TABLET | Freq: Once | ORAL | Status: AC
  Administered 2023-07-15: 25 mg via ORAL
  Filled 2023-07-15: qty 1

## 2023-07-15 MED ORDER — IPRATROPIUM-ALBUTEROL 0.5-2.5 (3) MG/3ML IN SOLN
3.0000 mL | Freq: Three times a day (TID) | RESPIRATORY_TRACT | Status: DC
  Administered 2023-07-15 – 2023-07-20 (×16): 3 mL via RESPIRATORY_TRACT
  Filled 2023-07-15 (×16): qty 3

## 2023-07-15 MED ORDER — FUROSEMIDE 10 MG/ML IJ SOLN
20.0000 mg | Freq: Once | INTRAMUSCULAR | Status: AC
  Administered 2023-07-15: 20 mg via INTRAVENOUS
  Filled 2023-07-15: qty 2

## 2023-07-15 MED ORDER — FUROSEMIDE 10 MG/ML IJ SOLN
40.0000 mg | Freq: Once | INTRAMUSCULAR | Status: AC
  Administered 2023-07-15: 40 mg via INTRAVENOUS
  Filled 2023-07-15: qty 4

## 2023-07-15 NOTE — Progress Notes (Signed)
 [facility] Liaison Note  Received request to discuss with family OP Palliative care vs Hospice at home.   Met with daughter as well as other family members at bedside and discussed the Palliative and hospice differences.   They verbalized understanding and will discuss further among themselves.   Thank you for the opportunity to participate in this patient's care.   Madelene Schanz BSN, Charity fundraiser, OCN [employer] [phone]

## 2023-07-15 NOTE — Progress Notes (Signed)
 PROGRESS NOTE                                                                                                                                                                                                             Patient Demographics:    Julie Lara, is a 89 y.o. female, DOB - [birth year], WUJ:[id number]  Outpatient Primary MD for the patient is Geoffry Paradise, MD    LOS - 2  Admit date - 07/12/2023    Chief Complaint  Patient presents with   Shortness of Breath   Weakness       Brief Narrative (HPI from H&P)    89 y.o. female with medical history significant of hypertension, hyperlipidemia, hypothyroidism, CKD 3, chronic systolic CHF, CAD, atrial fibrillation, anemia presenting with worsening shortness of breath.  Workup suggestive of acute hypoxic respiratory failure due to metapneumovirus pNA.   Subjective:   Patient in bed, appears comfortable, denies any headache, no fever, no chest pain or pressure, ongoing cough, wheezing and shortness of breath , no abdominal pain. No focal weakness.   Assessment  & Plan :    Acute respiratory failure with hypoxia due to metapneumovirus pneumonia.  Patient fairly hypoxic, continue supplemental oxygen 5 to 6 L, BiPAP if needed, placed on IV steroids along with oral azithromycin, likely has mild superimposed bacterial component to her pneumonia as well.  Encouraged to sit in chair use I-S and flutter valve for pulmonary toiletry, monitor closely.  Overall tenuous.  Speech to also evaluate, plan discussed with RT and RN.  SpO2: 94 % O2 Flow Rate (L/min): 5 L/min FiO2 (%): 50 %   Prolonged QTc > QTc 500 on EKG.   - Avoid QTc prolonging medications -Keep electrolytes stable, renew low-dose beta-blocker, repeat EKG 07/14/2023 with improvement in QTc   Hypertension - Continue beta-blocker as tolerated by blood pressure   Hyperlipidemia - Continue home atorvastatin    Hypothyroidism - Continue home Synthroid stable TSH.   CKD 3 B > Creatinine slight around 1.4 - Trend renal function and electrolytes   Acute on chronic systolic CHF EF 45% to 50%. > Has some acute decompensation on 07/14/2023, IV Lasix, low-dose beta-blocker, dose Lasix daily continue low-dose beta-blocker.  Renal insufficiency precludes the use of ACE/ARB/Entresto.   CAD > Status post PCI per chart - Continue home ASA, metoprolol,  atorvastatin   Paroxysmal atrial fibrillation with RVR > Currently sinus rhythm. - Continue home metoprolol, as needed IV Cardizem - Not on any anticoagulation   Thrombocytopenia > Stable at 92 - Trend CBC      Hypoglycemia > Noted to have some hypoglycemia by EMS.  Responded to D10 now hyperglycemic. - Trend CBGs   CBG (last 3)  Recent Labs    07/13/23 2348 07/14/23 0838 07/15/23 0045  GLUCAP 96 109* 158*         Condition - Extremely Guarded  Family Communication  :  daughter bedside 07/13/23, multiple family members bedside 07/14/2023, family members 07/15/2023  Code Status :  DNR  Consults  : Palliative care  PUD Prophylaxis : PPI   Procedures  :     CT -  Marked cardiomegaly. Coronary artery disease. 4.7 cm ascending thoracic aortic aneurysm. Recommend semi-annual imaging followup by CTA or MRA and referral to cardiothoracic surgery if not already obtained.  TTE - 1. Left ventricular ejection fraction, by estimation, is 45 to 50%. The left ventricle has mildly decreased function. The left ventricle has no regional wall motion abnormalities. There is mild concentric left ventricular hypertrophy. Left ventricular diastolic parameters are indeterminate.  2. Right ventricular systolic function is mildly reduced. The right ventricular size is severely enlarged. There is moderately elevated pulmonary artery systolic pressure. The estimated right ventricular systolic pressure is 46.4 mmHg.  3. Left atrial size was severely dilated.  4.  Right atrial size was severely dilated.  5. The mitral valve is normal in structure. Mild mitral valve regurgitation. No evidence of mitral stenosis.  6. Severe, secondary regurgitation with coaptation gap of 0.7 cm. The tricuspid valve is abnormal. Tricuspid valve regurgitation is severe.  7. Severe, central, aortic regurgitation with 2D vena contracta of 0.8 cm and holodiastolic reversal in the descending aortic arch. The aortic valve is tricuspid. Aortic valve regurgitation is severe. Aortic valve sclerosis/calcification is present, without any evidence of aortic stenosis.  8. Pulmonic valve regurgitation is moderate.  9. Aortic dilatation noted. There is mild dilatation of the aortic root, measuring 41 mm. There is mild dilatation of the ascending aorta, measuring 45 mm. 10. The inferior vena cava is dilated in size with <50% respiratory variability, suggesting right atrial pressure of 15 mmHg.      Disposition Plan  :    Status is: Inpt  DVT Prophylaxis  :    heparin injection 5,000 Units Start: 07/12/23 1615    Lab Results  Component Value Date   PLT 104 (L) 07/15/2023    Diet :  Diet Order             DIET DYS 3 Room service appropriate? Yes; Fluid consistency: Nectar Thick  Diet effective now                    Inpatient Medications  Scheduled Meds:  aspirin  81 mg Oral QPM   atorvastatin  40 mg Oral QPM   azithromycin  500 mg Oral Daily   B-complex with vitamin C  1 tablet Oral Daily   vitamin B-12  1,000 mcg Oral Q lunch   heparin  5,000 Units Subcutaneous Q8H   ipratropium-albuterol  3 mL Nebulization TID   levothyroxine  25 mcg Oral Q0600   methylPREDNISolone (SOLU-MEDROL) injection  60 mg Intravenous Q24H   metoprolol tartrate  25 mg Oral BID   multivitamin with minerals  1 tablet Oral Q lunch   pantoprazole (PROTONIX)  IV  40 mg Intravenous Q24H   sodium chloride flush  3 mL Intravenous Q12H   spironolactone  25 mg Oral Once   Continuous Infusions: PRN  Meds:.acetaminophen **OR** acetaminophen, diltiazem, guaiFENesin-dextromethorphan, ipratropium-albuterol, nitroGLYCERIN, polyethylene glycol, sodium chloride     Objective:   Vitals:   07/15/23 0000 07/15/23 0836 07/15/23 0850 07/15/23 0954  BP: 106/65  138/80 104/64  Pulse: 77 (!) 114 (!) 108 99  Resp: (!) 22 (!) 30 (!) 36 20  Temp:   98 F (36.7 C) 98 F (36.7 C)  TempSrc:   Oral Oral  SpO2: 99% 95% 98% 94%  Weight:      Height:        Wt Readings from Last 3 Encounters:  07/12/23 54 kg  07/08/23 54 kg  07/31/22 56 kg     Intake/Output Summary (Last 24 hours) at 07/15/2023 1014 Last data filed at 07/14/2023 1417 Gross per 24 hour  Intake --  Output 600 ml  Net -600 ml      Physical Exam  Awake Alert, No new F.N deficits, Normal affect [place].AT,PERRAL Supple Neck, No JVD,   Symmetrical Chest wall movement, Good air movement bilaterally, diffuse wheezing and crackles RRR,No Gallops,Rubs or new Murmurs,  +ve B.Sounds, Abd Soft, No tenderness,   No Cyanosis, Clubbing or edema       Data Review:    Recent Labs  Lab 07/08/23 2032 07/12/23 1220 07/12/23 1511 07/13/23 0612 07/14/23 0746 07/15/23 0434  WBC 4.5 5.3  --  5.1 3.7* 4.0  HGB 11.8* 12.1 11.6* 10.8* 10.7* 10.2*  HCT 38.1 38.5 34.0* 34.1* 34.3* 31.9*  PLT 92* 92*  --  94* 103* 104*  MCV 97.7 97.0  --  95.3 95.3 95.8  MCH 30.3 30.5  --  30.2 29.7 30.6  MCHC 31.0 31.4  --  31.7 31.2 32.0  RDW 16.2* 16.0*  --  16.0* 15.9* 15.6*  LYMPHSABS 2.2  --   --   --  0.8 0.9  MONOABS 0.9  --   --   --  0.2 0.4  EOSABS 0.0  --   --   --  0.0 0.0  BASOSABS 0.0  --   --   --  0.0 0.0    Recent Labs  Lab 07/08/23 2032 07/12/23 1220 07/12/23 1511 07/12/23 1631 07/13/23 0612 07/14/23 0746 07/15/23 0434  NA 142 139 141  --  143 140 139  K 3.6 3.9 3.6  --  4.1 4.3 4.2  CL 100 99  --   --  99 99 100  CO2 29 27  --   --  31 29 28   ANIONGAP 13 13  --   --  13 12 11   GLUCOSE 120* 278*  --   --  117* 99 114*   BUN 22 29*  --   --  26* 31* 37*  CREATININE 1.32* 1.26*  --   --  1.26* 1.31* 1.43*  AST 33 62*  --   --  36  --   --   ALT 22 29  --   --  24  --   --   ALKPHOS 96 90  --   --  81  --   --   BILITOT 1.0 1.1  --   --  0.8  --   --   ALBUMIN 3.5 3.2*  --   --  3.0*  --   --   CRP  --   --   --   --  1.5*  --   --   PROCALCITON  --   --   --  <0.10 <0.10  --   --   TSH  --   --   --   --  0.509  --   --   BNP 497.8* 613.3*  --   --  498.4* 476.4* 793.6*  MG  --   --   --   --  2.1 2.3 2.2  PHOS  --   --   --   --   --  2.9  --   CALCIUM 9.2 8.8*  --   --  8.8* 8.9 8.8*      Recent Labs  Lab 07/08/23 2032 07/12/23 1220 07/12/23 1631 07/13/23 0612 07/14/23 0746 07/15/23 0434  CRP  --   --   --  1.5*  --   --   PROCALCITON  --   --  <0.10 <0.10  --   --   TSH  --   --   --  0.509  --   --   BNP 497.8* 613.3*  --  498.4* 476.4* 793.6*  MG  --   --   --  2.1 2.3 2.2  CALCIUM 9.2 8.8*  --  8.8* 8.9 8.8*    --------------------------------------------------------------------------------------------------------------- Lab Results  Component Value Date   CHOL 146 12/15/2012   HDL 64.10 12/15/2012   LDLCALC 71 12/15/2012   TRIG 54.0 12/15/2012   CHOLHDL 2 12/15/2012      Micro Results Recent Results (from the past 240 hours)  Resp panel by RT-PCR (RSV, Flu A&B, Covid) Anterior Nasal Swab     Status: None   Collection Time: 07/08/23  8:37 PM   Specimen: Anterior Nasal Swab  Result Value Ref Range Status   SARS Coronavirus 2 by RT PCR NEGATIVE NEGATIVE Final   Influenza A by PCR NEGATIVE NEGATIVE Final   Influenza B by PCR NEGATIVE NEGATIVE Final    Comment: (NOTE) The Xpert Xpress SARS-CoV-2/FLU/RSV plus assay is intended as an aid in the diagnosis of influenza from Nasopharyngeal swab specimens and should not be used as a sole basis for treatment. Nasal washings and aspirates are unacceptable for Xpert Xpress SARS-CoV-2/FLU/RSV testing.  Fact Sheet for  Patients: BloggerCourse.com  Fact Sheet for Healthcare Providers: SeriousBroker.it  This test is not yet approved or cleared by the [place]  FDA and has been authorized for detection and/or diagnosis of SARS-CoV-2 by FDA under an Emergency Use Authorization (EUA). This EUA will remain in effect (meaning this test can be used) for the duration of the COVID-19 declaration under Section 564(b)(1) of the Act, 21 U.S.C. section 360bbb-3(b)(1), unless the authorization is terminated or revoked.     Resp Syncytial Virus by PCR NEGATIVE NEGATIVE Final    Comment: (NOTE) Fact Sheet for Patients: BloggerCourse.com  Fact Sheet for Healthcare Providers: SeriousBroker.it  This test is not yet approved or cleared by the [place]  FDA and has been authorized for detection and/or diagnosis of SARS-CoV-2 by FDA under an Emergency Use Authorization (EUA). This EUA will remain in effect (meaning this test can be used) for the duration of the COVID-19 declaration under Section 564(b)(1) of the Act, 21 U.S.C. section 360bbb-3(b)(1), unless the authorization is terminated or revoked.  Performed at [facility], [address] [place], [place] [address]   Respiratory (~20 pathogens) panel by PCR     Status: Abnormal   Collection Time: 07/12/23  4:09 PM   Specimen: Nasopharyngeal  Swab; Respiratory  Result Value Ref Range Status   Adenovirus NOT DETECTED NOT DETECTED Final   Coronavirus 229E NOT DETECTED NOT DETECTED Final    Comment: (NOTE) The Coronavirus on the Respiratory Panel, DOES NOT test for the novel  Coronavirus (2019 nCoV)    Coronavirus HKU1 NOT DETECTED NOT DETECTED Final   Coronavirus NL63 NOT DETECTED NOT DETECTED Final   Coronavirus OC43 NOT DETECTED NOT DETECTED Final   Metapneumovirus DETECTED (A) NOT DETECTED Final   Rhinovirus / Enterovirus NOT DETECTED NOT  DETECTED Final   Influenza A NOT DETECTED NOT DETECTED Final   Influenza B NOT DETECTED NOT DETECTED Final   Parainfluenza Virus 1 NOT DETECTED NOT DETECTED Final   Parainfluenza Virus 2 NOT DETECTED NOT DETECTED Final   Parainfluenza Virus 3 NOT DETECTED NOT DETECTED Final   Parainfluenza Virus 4 NOT DETECTED NOT DETECTED Final   Respiratory Syncytial Virus NOT DETECTED NOT DETECTED Final   Bordetella pertussis NOT DETECTED NOT DETECTED Final   Bordetella Parapertussis NOT DETECTED NOT DETECTED Final   Chlamydophila pneumoniae NOT DETECTED NOT DETECTED Final   Mycoplasma pneumoniae NOT DETECTED NOT DETECTED Final    Comment: Performed at [facility], [address], [place], [place] [address]  Blood culture (routine x 2)     Status: None (Preliminary result)   Collection Time: 07/12/23  4:51 PM   Specimen: BLOOD RIGHT FOREARM  Result Value Ref Range Status   Specimen Description BLOOD RIGHT FOREARM  Final   Special Requests   Final    BOTTLES DRAWN AEROBIC AND ANAEROBIC Blood Culture results may not be optimal due to an inadequate volume of blood received in culture bottles   Culture   Final    NO GROWTH 3 DAYS Performed at [facility], [address], [place], [place] [address]    Report Status PENDING  Incomplete  Blood culture (routine x 2)     Status: None (Preliminary result)   Collection Time: 07/12/23  8:56 PM   Specimen: BLOOD LEFT HAND  Result Value Ref Range Status   Specimen Description BLOOD LEFT HAND  Final   Special Requests   Final    BOTTLES DRAWN AEROBIC ONLY Blood Culture results may not be optimal due to an inadequate volume of blood received in culture bottles   Culture   Final    NO GROWTH 3 DAYS Performed at [facility], [address] [place], [place] [address]    Report Status PENDING  Incomplete    Radiology Report DG Chest Port 1 View Result Date: 07/15/2023 CLINICAL DATA:  Shortness of breath. EXAM: PORTABLE CHEST 1 VIEW  COMPARISON:  07/13/2023 FINDINGS: The cardio pericardial silhouette is enlarged. There is pulmonary vascular congestion without overt pulmonary edema. Retrocardiac left base collapse/consolidation is similar to prior. Bones are diffusely demineralized. Telemetry leads overlie the chest. IMPRESSION: 1. Enlargement of the cardiopericardial silhouette with pulmonary vascular congestion. 2. Retrocardiac left base collapse/consolidation, similar to prior. Electronically Signed   By: Kennith Center M.D.   On: 07/15/2023 06:20   ECHOCARDIOGRAM COMPLETE Result Date: 07/13/2023    ECHOCARDIOGRAM REPORT   Patient Name:   CHARLES NIESE Date of Exam: 07/13/2023 Medical Rec #:  [id number]       Height:       64.0 in Accession #:    [id number]      Weight:       119.0 lb Date of Birth:  1920-05-05  BSA:          1.569 m Patient Age:    89 years       BP:           121/80 mmHg Patient Gender: F               HR:           91 bpm. Exam Location:  Inpatient Procedure: 2D Echo, Color Doppler and Cardiac Doppler (Both Spectral and Color            Flow Doppler were utilized during procedure). Indications:    Acute Respiratory Distress R06.03  History:        Patient has prior history of Echocardiogram examinations, most                 recent 02/02/2017. CAD, Arrythmias:Atrial Fibrillation; Risk                 Factors:Hypertension and Dyslipidemia.  Sonographer:    Sherline Distel Senior RDCS Referring Phys: [id number] Gayl Katos MELVIN IMPRESSIONS  1. Left ventricular ejection fraction, by estimation, is 45 to 50%. The left ventricle has mildly decreased function. The left ventricle has no regional wall motion abnormalities. There is mild concentric left ventricular hypertrophy. Left ventricular diastolic parameters are indeterminate.  2. Right ventricular systolic function is mildly reduced. The right ventricular size is severely enlarged. There is moderately elevated pulmonary artery systolic pressure. The estimated right ventricular  systolic pressure is 46.4 mmHg.  3. Left atrial size was severely dilated.  4. Right atrial size was severely dilated.  5. The mitral valve is normal in structure. Mild mitral valve regurgitation. No evidence of mitral stenosis.  6. Severe, secondary regurgitation with coaptation gap of 0.7 cm. The tricuspid valve is abnormal. Tricuspid valve regurgitation is severe.  7. Severe, central, aortic regurgitation with 2D vena contracta of 0.8 cm and holodiastolic reversal in the descending aortic arch. The aortic valve is tricuspid. Aortic valve regurgitation is severe. Aortic valve sclerosis/calcification is present, without any evidence of aortic stenosis.  8. Pulmonic valve regurgitation is moderate.  9. Aortic dilatation noted. There is mild dilatation of the aortic root, measuring 41 mm. There is mild dilatation of the ascending aorta, measuring 45 mm. 10. The inferior vena cava is dilated in size with <50% respiratory variability, suggesting right atrial pressure of 15 mmHg. Comparison(s): Prior images reviewed side by side. Further progression of her aortic regurgitation from imaging done before age 24. FINDINGS  Left Ventricle: Left ventricular ejection fraction, by estimation, is 45 to 50%. The left ventricle has mildly decreased function. The left ventricle has no regional wall motion abnormalities. Strain was performed and the global longitudinal strain is indeterminate. The left ventricular internal cavity size was normal in size. There is mild concentric left ventricular hypertrophy. Left ventricular diastolic function could not be evaluated due to atrial fibrillation. Left ventricular diastolic parameters are indeterminate. Right Ventricle: The right ventricular size is severely enlarged. No increase in right ventricular wall thickness. Right ventricular systolic function is mildly reduced. There is moderately elevated pulmonary artery systolic pressure. The tricuspid regurgitant velocity is 2.80 m/s, and  with an assumed right atrial pressure of 15 mmHg, the estimated right ventricular systolic pressure is 46.4 mmHg. Left Atrium: Left atrial size was severely dilated. Right Atrium: Right atrial size was severely dilated. Pericardium: There is no evidence of pericardial effusion. Mitral Valve: The mitral valve is normal in structure. Mild mitral valve regurgitation. No evidence of  mitral valve stenosis. Tricuspid Valve: Severe, secondary regurgitation with coaptation gap of 0.7 cm. The tricuspid valve is abnormal. Tricuspid valve regurgitation is severe. No evidence of tricuspid stenosis. Aortic Valve: Severe, central, aortic regurgitation with 2D vena contracta of 0.8 cm and holodiastolic reversal in the descending aortic arch. The aortic valve is tricuspid. There is mild aortic valve annular calcification. Aortic valve regurgitation is severe. Aortic valve sclerosis/calcification is present, without any evidence of aortic stenosis. Pulmonic Valve: The pulmonic valve was normal in structure. Pulmonic valve regurgitation is moderate. No evidence of pulmonic stenosis. Aorta: Aortic dilatation noted. There is mild dilatation of the aortic root, measuring 41 mm. There is mild dilatation of the ascending aorta, measuring 45 mm. Venous: The inferior vena cava is dilated in size with less than 50% respiratory variability, suggesting right atrial pressure of 15 mmHg. IAS/Shunts: No atrial level shunt detected by color flow Doppler. Additional Comments: 3D was performed not requiring image post processing on an independent workstation and was indeterminate.  LEFT VENTRICLE PLAX 2D LVIDd:         4.70 cm LVIDs:         3.20 cm LV PW:         1.20 cm LV IVS:        1.10 cm LVOT diam:     2.10 cm LV SV:         47 LV SV Index:   30 LVOT Area:     3.46 cm  RIGHT VENTRICLE RV S prime:     10.20 cm/s TAPSE (M-mode): 1.4 cm LEFT ATRIUM              Index         RIGHT ATRIUM           Index LA diam:        5.10 cm  3.25 cm/m    RA  Area:     39.50 cm LA Vol (A2C):   162.0 ml 103.23 ml/m  RA Volume:   152.00 ml 96.86 ml/m LA Vol (A4C):   141.0 ml 89.85 ml/m LA Biplane Vol: 159.0 ml 101.32 ml/m  AORTIC VALVE LVOT Vmax:         73.90 cm/s LVOT Vmean:        53.700 cm/s LVOT VTI:          0.137 m AR Vena Contracta: 0.80 cm  AORTA Ao Root diam: 4.10 cm Ao Asc diam:  4.50 cm TRICUSPID VALVE TR Peak grad:   31.4 mmHg TR Vmax:        280.00 cm/s  SHUNTS Systemic VTI:  0.14 m Systemic Diam: 2.10 cm Gloriann Larger MD Electronically signed by Gloriann Larger MD Signature Date/Time: 07/13/2023/3:11:01 PM    Final      Signature  -   Lynnwood Sauer M.D on 07/15/2023 at 10:14 AM   -  To page go to [email or web address]

## 2023-07-15 NOTE — Progress Notes (Signed)
 Occupational Therapy Treatment Patient Details Name: Julie Lara MRN: [id number] DOB: 03/03/1921 Today's Date: 07/15/2023   History of present illness Patient is 89 yo female admitted on 07/12/23 for acute respioratory failure with hypoxia. PMH significant for HTN, hyperlipidemia, hypothyroidism, CKD 3, chronic systolic CHF, CAD, atrial fibrillation, and anemia.   OT comments  Patient making good gains with OT treatment with min assist to stand from chair with cues for hand placement and assistance to power up. Patient able to stand for grooming task with one extremity support with RW. Patient able to address LB dressing with doffing socks and mod assist to donn while seated. Discharge recommendations continue to be appropriate. Acute OT to continue to follow to address established goals to facilitate DC to next venue of care.       If plan is discharge home, recommend the following:  A little help with walking and/or transfers;A little help with bathing/dressing/bathroom;Assistance with cooking/housework;Help with stairs or ramp for entrance;Assist for transportation   Equipment Recommendations  BSC/3in1    Recommendations for Other Services      Precautions / Restrictions Precautions Precautions: Fall;Other (comment) Precaution/Restrictions Comments: monitor HR with activity Restrictions Weight Bearing Restrictions Per Provider Order: No       Mobility Bed Mobility Overal bed mobility: Needs Assistance             General bed mobility comments: OOB in chair    Transfers Overall transfer level: Needs assistance Equipment used: Rolling walker (2 wheels) Transfers: Sit to/from Stand Sit to Stand: Min assist           General transfer comment: verbal cues for hand placement and min assist to power up     Balance Overall balance assessment: Needs assistance Sitting-balance support: No upper extremity supported, Feet supported Sitting balance-Leahy Scale:  Fair     Standing balance support: Single extremity supported, Bilateral upper extremity supported, During functional activity Standing balance-Leahy Scale: Poor Standing balance comment: reliant on external support for balance                           ADL either performed or assessed with clinical judgement   ADL Overall ADL's : Needs assistance/impaired     Grooming: Brushing hair;Minimal assistance;Standing               Lower Body Dressing: Moderate assistance;Sitting/lateral leans Lower Body Dressing Details (indicate cue type and reason): able to doff socks and mod assist to donn             Functional mobility during ADLs: Contact guard assist;Rolling walker (2 wheels)      Extremity/Trunk Assessment              Vision       Perception     Praxis     Communication Communication Communication: No apparent difficulties Factors Affecting Communication: Hearing impaired   Cognition Arousal: Alert Behavior During Therapy: WFL for tasks assessed/performed Cognition: No apparent impairments                               Following commands: Intact (Hard of hearing)        Cueing   Cueing Techniques: Verbal cues, Gestural cues, Tactile cues  Exercises      Shoulder Instructions       General Comments 5 liters O2 with 96% SpO2    Pertinent Vitals/ Pain  Pain Assessment Pain Assessment: No/denies pain  Home Living                                          Prior Functioning/Environment              Frequency  Min 1X/week        Progress Toward Goals  OT Goals(current goals can now be found in the care plan section)  Progress towards OT goals: Progressing toward goals  Acute Rehab OT Goals Patient Stated Goal: to get better OT Goal Formulation: With patient Time For Goal Achievement: 07/27/23 Potential to Achieve Goals: Good ADL Goals Pt Will Perform Grooming: with  supervision;standing Pt Will Perform Lower Body Dressing: with supervision;sitting/lateral leans;sit to/from stand Pt Will Transfer to Toilet: with supervision;ambulating  Plan      Co-evaluation                 AM-PAC OT "6 Clicks" Daily Activity     Outcome Measure   Help from another person eating meals?: None Help from another person taking care of personal grooming?: A Little Help from another person toileting, which includes using toliet, bedpan, or urinal?: A Little Help from another person bathing (including washing, rinsing, drying)?: A Little Help from another person to put on and taking off regular upper body clothing?: A Little Help from another person to put on and taking off regular lower body clothing?: A Little 6 Click Score: 19    End of Session Equipment Utilized During Treatment: Gait belt;Rolling walker (2 wheels)  OT Visit Diagnosis: Unsteadiness on feet (R26.81);Muscle weakness (generalized) (M62.81);Other (comment) (decreased activity tolerance)   Activity Tolerance Patient tolerated treatment well   Patient Left in chair;with call bell/phone within reach;with chair alarm set;with family/visitor present   Nurse Communication Mobility status        Time: 5409-8119 OT Time Calculation (min): 18 min  Charges: OT General Charges $OT Visit: 1 Visit OT Treatments $Self Care/Home Management : 8-22 mins  Anitra Barn, OTA Acute Rehabilitation Services  Office [phone]   Jovita Nipper 07/15/2023, 3:21 PM

## 2023-07-15 NOTE — Plan of Care (Signed)

## 2023-07-15 NOTE — Progress Notes (Signed)
   07/15/23 0947  Mobility  Activity Transferred from bed to chair  Level of Assistance Minimal assist, patient does 75% or more  Assistive Device Other (Comment) (HHA)  Activity Response Tolerated fair  Mobility Referral Yes  Mobility visit 1 Mobility  Mobility Specialist Start Time (ACUTE ONLY) [id number]  Mobility Specialist Stop Time (ACUTE ONLY) 0947  Mobility Specialist Time Calculation (min) (ACUTE ONLY) 23 min   Mobility Specialist: Progress Note  Pre-Mobility:      HR 113, SpO2 94% 5L During Mobility: HR 130s Post-Mobility:    HR 108, SpO2 96% 5L  Pt agreeable to mobility session - received in bed. C/o burning in nose, pt with dry cough states "stuck in my throat." - RN aware. Pt given cues for PLB. Returned to chair with all needs met - call bell within reach. Chair alarm on.   Julie Lara, BS Mobility Specialist Please contact via SecureChat or  Rehab office at [phone].

## 2023-07-15 NOTE — Plan of Care (Signed)
Steady progression

## 2023-07-15 NOTE — Progress Notes (Signed)
 Speech Language Pathology Treatment: Dysphagia  Patient Details Name: Julie KOSLOSKI MRN: [id number] DOB: 07-06-1920 Today's Date: 07/15/2023 Time: 6387-5643 SLP Time Calculation (min) (ACUTE ONLY): 30 min  Assessment / Plan / Recommendation Clinical Impression  Pt was seen for dysphagia tx. Pt's family was present for session. Pt and family note no recent difficulties with current diet. Family does state that pt is not enjoying thickened liquids. Pt was observed with nectar-thick and thin-liquids. Pt demonstrated no overt signs of aspiration with nectar-thick liquids. Single event of delayed coughing and wet vocal quality with thin-liquid was present; pt also experienced a higher RR (~37) after moment of delayed coughing. After a minute of focused breathing, pt's RR stabilized. Pt was encouraged to take small sips to optimize swallow safety. SLP engaged in a discussion with pt's family about the option of obtaining an instrumental swallow evaluation, as well as the option of allowing pt to have unrestricted diet for comfort. Family decided that they want pt to have MBSS.   SLP will f/u tomorrow for MBSS to determine next steps for intervention.    HPI HPI: Patient is 89 yo female admitted on 07/12/23 for acute respioratory failure with hypoxia. PMH significant for HTN, hyperlipidemia, hypothyroidism, CKD 3, chronic systolic CHF, CAD, atrial fibrillation, and anemia.Pt has a hx of GERD with two previous esophageal barium studies (one in 2018 and one in 2013), both indicate a moderate reflux. Per family reports, the pt takes medication for reflux. Pt has hx of thyroidectomy in 2008, where there was noted vocal changes after the procedure.      SLP Plan  MBS      Recommendations for follow up therapy are one component of a multi-disciplinary discharge planning process, led by the attending physician.  Recommendations may be updated based on patient status, additional functional criteria and  insurance authorization.    Recommendations                     Oral care BID     Dysphagia, unspecified (R13.10)     MBS     [name]  07/15/2023, 11:42 AM

## 2023-07-16 ENCOUNTER — Inpatient Hospital Stay (HOSPITAL_COMMUNITY)

## 2023-07-16 DIAGNOSIS — Z789 Other specified health status: Secondary | ICD-10-CM | POA: Diagnosis not present

## 2023-07-16 DIAGNOSIS — J9601 Acute respiratory failure with hypoxia: Secondary | ICD-10-CM | POA: Diagnosis not present

## 2023-07-16 DIAGNOSIS — Z515 Encounter for palliative care: Secondary | ICD-10-CM | POA: Diagnosis not present

## 2023-07-16 DIAGNOSIS — Z7189 Other specified counseling: Secondary | ICD-10-CM | POA: Diagnosis not present

## 2023-07-16 LAB — CBC WITH DIFFERENTIAL/PLATELET
Abs Immature Granulocytes: 0.05 10*3/uL (ref 0.00–0.07)
Basophils Absolute: 0 10*3/uL (ref 0.0–0.1)
Basophils Relative: 0 %
Eosinophils Absolute: 0 10*3/uL (ref 0.0–0.5)
Eosinophils Relative: 0 %
HCT: 32 % — ABNORMAL LOW (ref 36.0–46.0)
Hemoglobin: 10.3 g/dL — ABNORMAL LOW (ref 12.0–15.0)
Immature Granulocytes: 1 %
Lymphocytes Relative: 20 %
Lymphs Abs: 0.8 10*3/uL (ref 0.7–4.0)
MCH: 30.5 pg (ref 26.0–34.0)
MCHC: 32.2 g/dL (ref 30.0–36.0)
MCV: 94.7 fL (ref 80.0–100.0)
Monocytes Absolute: 0.3 10*3/uL (ref 0.1–1.0)
Monocytes Relative: 8 %
Neutro Abs: 3 10*3/uL (ref 1.7–7.7)
Neutrophils Relative %: 71 %
Platelets: 104 10*3/uL — ABNORMAL LOW (ref 150–400)
RBC: 3.38 MIL/uL — ABNORMAL LOW (ref 3.87–5.11)
RDW: 15.3 % (ref 11.5–15.5)
WBC: 4.2 10*3/uL (ref 4.0–10.5)
nRBC: 0 % (ref 0.0–0.2)

## 2023-07-16 LAB — BASIC METABOLIC PANEL WITH GFR
Anion gap: 11 (ref 5–15)
BUN: 34 mg/dL — ABNORMAL HIGH (ref 8–23)
CO2: 31 mmol/L (ref 22–32)
Calcium: 8.7 mg/dL — ABNORMAL LOW (ref 8.9–10.3)
Chloride: 99 mmol/L (ref 98–111)
Creatinine, Ser: 1.22 mg/dL — ABNORMAL HIGH (ref 0.44–1.00)
GFR, Estimated: 39 mL/min — ABNORMAL LOW (ref >60)
Glucose, Bld: 111 mg/dL — ABNORMAL HIGH (ref 70–99)
Potassium: 4.3 mmol/L (ref 3.5–5.1)
Sodium: 141 mmol/L (ref 135–145)

## 2023-07-16 LAB — BRAIN NATRIURETIC PEPTIDE: B Natriuretic Peptide: 621.5 pg/mL — ABNORMAL HIGH (ref 0.0–100.0)

## 2023-07-16 LAB — MAGNESIUM: Magnesium: 2.2 mg/dL (ref 1.7–2.4)

## 2023-07-16 LAB — GLUCOSE, CAPILLARY: Glucose-Capillary: 129 mg/dL — ABNORMAL HIGH (ref 70–99)

## 2023-07-16 MED ORDER — METOPROLOL TARTRATE 50 MG PO TABS
50.0000 mg | ORAL_TABLET | Freq: Two times a day (BID) | ORAL | Status: DC
  Administered 2023-07-16 – 2023-07-20 (×8): 50 mg via ORAL
  Filled 2023-07-16 (×9): qty 1

## 2023-07-16 MED ORDER — SPIRONOLACTONE 25 MG PO TABS
25.0000 mg | ORAL_TABLET | Freq: Once | ORAL | Status: AC
  Administered 2023-07-16: 25 mg via ORAL
  Filled 2023-07-16: qty 1

## 2023-07-16 MED ORDER — FUROSEMIDE 10 MG/ML IJ SOLN
40.0000 mg | Freq: Once | INTRAMUSCULAR | Status: AC
  Administered 2023-07-16: 40 mg via INTRAVENOUS
  Filled 2023-07-16: qty 4

## 2023-07-16 MED ORDER — METOPROLOL TARTRATE 50 MG PO TABS: 50.0000 mg | ORAL_TABLET | Freq: Two times a day (BID) | ORAL | Status: DC

## 2023-07-16 MED ORDER — AZITHROMYCIN 500 MG PO TABS
500.0000 mg | ORAL_TABLET | Freq: Every day | ORAL | Status: AC
  Administered 2023-07-16 – 2023-07-17 (×2): 500 mg via ORAL
  Filled 2023-07-16 (×2): qty 1

## 2023-07-16 NOTE — Evaluation (Signed)
 Modified Barium Swallow Study  Patient Details  Name: Julie Lara MRN: [id number] Date of Birth: 05-21-1920  Today's Date: 07/16/2023  Modified Barium Swallow completed.  Full report located under Chart Review in the Imaging Section.  History of Present Illness Patient is 89 yo female admitted on 07/12/23 for acute respioratory failure with hypoxia. PMH significant for HTN, hyperlipidemia, hypothyroidism, CKD 3, chronic systolic CHF, CAD, atrial fibrillation, and anemia.Pt has a hx of GERD with two previous esophageal barium studies (one in 2018 and one in 2013), both indicate a moderate reflux. Per family reports, the pt takes medication for reflux. Pt has hx of thyroidectomy in 2008, where there was noted vocal changes after the procedure.   Clinical Impression Pt presents with a moderate oropharyngeal dysphagia (DIGEST Score: 2) characterized by trace aspiration and penetration of thin-liquid, as well as collection of pharyngeal residue due to retrograde bolus movement through UES. Exam was slightly limited due to reduced visibility because of pt posturing.   Pt's tongue hold for all consistencies was consistently adequate; however, trials toward the end displayed posterior escape of thin-liquid, which contributed to the moment of aspiration. Swallow initiation was consistently at posterior angle of ramus, unless premature spillage ocurred, where initiation was at pyriform level. Although anterior hyoid movement was [facility], superior laryngeal elevation was partial with delayed, complete epiglottic closure. These elements along with pharyngeal residue and premature spillage contributed to penetration and aspiration events. Multiple instances of penetration with thin liquids were noted; most significant was trace penetration of thin to level of VFs towards beginning of study. During single event of aspiration, pt experienced coughing, which was ineffective for clearing the aspirate. Partial  obstruction of UES bolus transit was noted potentially due to significant CP bar. Initially, majority of bolus would complete transit through UES, followed by retrgrade movement of less than half the bolus back into the pyriforms. This was major contributor to pharyngeal residue. An effortful swallow was attempted to improve swallow efficiency, with no evident benefit; pt experienced a brief episode of escalating RR into 60-70s after using effortful swallow. Multiple swallows appeared effective for clearing pharyngeal residue after initial swallow. Chin tuck was implemented to optimize airway safety, exhibiting signs of improved pharyngeal transit and minimal penetration of bolus in upper airway. Limited visbility impacted full observation of airway safety improvements with chin-tuck. Esophageal retention was noted with brief sweep of esophagus during thin-liquid trial.   Plan is to have further discussion with pt's family to determine course for diet recommendation. Multiple swallows and chin-tuck are strategies that displayed potential benefit for optimizing pt's airway safety and efficiency.  Factors that may increase risk of adverse event in presence of aspiration Roderick Civatte & Jessy Morocco 2021): Poor general health and/or compromised immunity;Respiratory or GI disease;Weak cough  Swallow Evaluation Recommendations Supervision: Patient able to self-feed;Intermittent supervision/cueing for swallowing strategies Swallowing strategies  : Multiple dry swallows after each bite/sip;Chin tuck Oral care recommendations: Oral care BID (2x/day)      [name] 07/16/2023,11:27 AM

## 2023-07-16 NOTE — Progress Notes (Signed)
 Physical Therapy Treatment Patient Details Name: Julie Lara MRN: [id number] DOB: 11-06-1920 Today's Date: 07/16/2023   History of Present Illness Patient is 89 yo female admitted on 07/12/23 for acute respioratory failure with hypoxia. PMH significant for HTN, hyperlipidemia, hypothyroidism, CKD 3, chronic systolic CHF, CAD, atrial fibrillation, and anemia.    PT Comments  Progressing towards acute functional goals, able to ambulate on 4L supplemental O2 up to 28 feet with min assist and RW for support. SpO2 maintained 90% and greater, 3/3 dyspnea but resolves with seated rest. HR to 120s while walking, 90 at rest. Gown wet, changed, assisted with peri-care. Upright in chair at end of session, RN and RT visiting. Was eager to get out of bed and mobilize. Family present. Patient will continue to benefit from skilled physical therapy services to further improve independence with functional mobility.    If plan is discharge home, recommend the following: Assistance with cooking/housework;A little help with bathing/dressing/bathroom;A little help with walking and/or transfers   Can travel by private vehicle        Equipment Recommendations  None recommended by PT (Patient uses standard walker.)    Recommendations for Other Services       Precautions / Restrictions Precautions Precautions: Fall;Other (comment) Precaution/Restrictions Comments: Monitor HR and O2. Restrictions Weight Bearing Restrictions Per Provider Order: No     Mobility  Bed Mobility Overal bed mobility: Needs Assistance Bed Mobility: Supine to Sit     Supine to sit: HOB elevated, Contact guard, Used rails     General bed mobility comments: CGA for safety, slow and effortful but without physical help today.    Transfers Overall transfer level: Needs assistance Equipment used: Rolling walker (2 wheels) Transfers: Sit to/from Stand Sit to Stand: Min assist           General transfer comment: Min  assist for boost to stand from bed and recliner. Cues for hand placement. RW for support upon rising.    Ambulation/Gait Ambulation/Gait assistance: Min assist Gait Distance (Feet): 28 Feet Assistive device: Rolling walker (2 wheels) Gait Pattern/deviations: Step-through pattern, Decreased stride length, Shuffle, Trunk flexed Gait velocity: dec Gait velocity interpretation: <1.31 ft/sec, indicative of household ambulator   General Gait Details: Cues for upright posture, to roll RW rather than lift, and min assist provided intermittently for RW control around obstacles. No overt buckling noted. SpO2 low 90s on 4L supplemental O2. HR to 120s, RR increased to 40 at max but low 30s throughout.   Stairs             Wheelchair Mobility     Tilt Bed    Modified Rankin (Stroke Patients Only)       Balance Overall balance assessment: Needs assistance Sitting-balance support: No upper extremity supported, Feet supported Sitting balance-Leahy Scale: Fair Sitting balance - Comments: Seated EOB with supervision   Standing balance support: Bilateral upper extremity supported, Reliant on assistive device for balance Standing balance-Leahy Scale: Poor                              Communication Communication Communication: No apparent difficulties Factors Affecting Communication: Hearing impaired  Cognition Arousal: Alert Behavior During Therapy: WFL for tasks assessed/performed   PT - Cognitive impairments: No apparent impairments                         Following commands: Intact (Hard of hearing)  Cueing Cueing Techniques: Verbal cues, Gestural cues  Exercises      General Comments General comments (skin integrity, edema, etc.): 100% on 6L when PT entered room, weaned to 4L on portable oxygen and maintained 94-98% at rest. RN and RT notified. Bed wet, purewick not attached to suction. Assisted pt with peri-care, gown change, RN notified.       Pertinent Vitals/Pain Pain Assessment Pain Assessment: No/denies pain    Home Living                          Prior Function            PT Goals (current goals can now be found in the care plan section) Acute Rehab PT Goals Patient Stated Goal: to be able to walk and return home PT Goal Formulation: With patient Time For Goal Achievement: 07/27/23 Potential to Achieve Goals: Good Progress towards PT goals: Progressing toward goals    Frequency    Min 3X/week      PT Plan      Co-evaluation              AM-PAC PT "6 Clicks" Mobility   Outcome Measure  Help needed turning from your back to your side while in a flat bed without using bedrails?: A Little Help needed moving from lying on your back to sitting on the side of a flat bed without using bedrails?: A Little Help needed moving to and from a bed to a chair (including a wheelchair)?: A Little Help needed standing up from a chair using your arms (e.g., wheelchair or bedside chair)?: A Little Help needed to walk in hospital room?: A Little Help needed climbing 3-5 steps with a railing? : Total 6 Click Score: 16    End of Session Equipment Utilized During Treatment: Gait belt;Oxygen Activity Tolerance: Patient tolerated treatment well Patient left: in chair;with chair alarm set;with call bell/phone within reach;with nursing/sitter in room;with family/visitor present Nurse Communication: Mobility status PT Visit Diagnosis: Unsteadiness on feet (R26.81);Other abnormalities of gait and mobility (R26.89);Muscle weakness (generalized) (M62.81)     Time: 6045-4098 PT Time Calculation (min) (ACUTE ONLY): 32 min  Charges:    $Gait Training: 8-22 mins $Therapeutic Activity: 8-22 mins PT General Charges $$ ACUTE PT VISIT: 1 Visit                     Jory Ng, PT, DPT [facility]  Rehabilitation Services Physical Therapist Office: [phone] Website: [email or web address]    Alinda Irani 07/16/2023, 3:03 PM

## 2023-07-16 NOTE — Progress Notes (Signed)
 Speech Language Pathology Treatment: Dysphagia  Patient Details Name: TRENELL MOXEY MRN: [id number] DOB: April 06, 1920 Today's Date: 07/16/2023 Time: 9562-1308 SLP Time Calculation (min) (ACUTE ONLY): 35 min  Assessment / Plan / Recommendation Clinical Impression  Pt was seen for education about MBS results and to determine plan of care. Pt's family (daughter, grand-daughter, and cousin) were present for session. Pt was drowsy throughout session. SLP provided results from MBS, including pt's risk for aspiration and potential factors contributing to aspiration and penetration. See MBS report for more information about results. Options for plan of care were introduced and aspiration risk was reinforced to family to support decision making for pt. Temporary continuation of nectar-thick/dysphagia 3 diet was recommended by SLP to provide an opportunity for pt to recover from current illness and to learn compensatory strategies to optimize swallow safety and efficiency. Family agreed with recommendation and plan. Pt's family asked questions regarding which types of food were allowed and if smoothies and ice cream were acceptable; SLP emphasized the risk of smoothies and ice cream due to them being of thin-texture. Additional strategies (diligent oral care) were discussed with family verbalizing understanding. Family demonstrated and verbalized understanding of education and plan of care presented by SLP.   SLP will f/u with pt and family tomorrow to introduce compensatory strategies to maximize pt's wellbeing with swallowing.    HPI HPI: Patient is 89 yo female admitted on 07/12/23 for acute respioratory failure with hypoxia. PMH significant for HTN, hyperlipidemia, hypothyroidism, CKD 3, chronic systolic CHF, CAD, atrial fibrillation, and anemia.Pt has a hx of GERD with two previous esophageal barium studies (one in 2018 and one in 2013), both indicate a moderate reflux. Per family reports, the pt takes  medication for reflux. Pt has hx of thyroidectomy in 2008, where there was noted vocal changes after the procedure.      SLP Plan  Continue with current plan of care      Recommendations for follow up therapy are one component of a multi-disciplinary discharge planning process, led by the attending physician.  Recommendations may be updated based on patient status, additional functional criteria and insurance authorization.    Recommendations  Diet recommendations: Dysphagia 3 (mechanical soft);Nectar-thick liquid Liquids provided via: Straw;Cup Medication Administration: Whole meds with puree Supervision: Full supervision/cueing for compensatory strategies Compensations: Slow rate;Small sips/bites;Multiple dry swallows after each bite/sip;Chin tuck Postural Changes and/or Swallow Maneuvers: Seated upright 90 degrees;Upright 30-60 min after meal                  Oral care BID   Intermittent Supervision/Assistance Dysphagia, pharyngeal phase (R13.13)     Continue with current plan of care     [name]  07/16/2023, 12:22 PM

## 2023-07-16 NOTE — Plan of Care (Signed)

## 2023-07-16 NOTE — Progress Notes (Signed)
 PROGRESS NOTE                                                                                                                                                                                                             Patient Demographics:    Julie Lara, is a 89 y.o. female, DOB - 1920/11/23, ZOX:[id number]  Outpatient Primary MD for the patient is Geoffry Paradise, MD    LOS - 3  Admit date - 07/12/2023    Chief Complaint  Patient presents with   Shortness of Breath   Weakness       Brief Narrative (HPI from H&P)    89 y.o. female with medical history significant of hypertension, hyperlipidemia, hypothyroidism, CKD 3, chronic systolic CHF, CAD, atrial fibrillation, anemia presenting with worsening shortness of breath.  Workup suggestive of acute hypoxic respiratory failure due to metapneumovirus pNA.   Subjective:   Patient in bed, appears comfortable, denies any headache, no fever, no chest pain or pressure, still  has cough, wheezing and exertional shortness of breath , no abdominal pain. No new focal weakness.    Assessment  & Plan :    Acute respiratory failure with hypoxia due to metapneumovirus pneumonia.  Patient fairly hypoxic, continue supplemental oxygen 5 to 6 L, BiPAP if needed, placed on IV steroids along with oral azithromycin for 5 days, likely has mild superimposed bacterial component to her pneumonia as well.  Encouraged to sit in chair use I-S and flutter valve for pulmonary toiletry, monitor closely.  Overall tenuous.  Speech to also evaluate, plan discussed with RT and RN.  She is also getting IV Lasix for diuresis along with IV steroids and supportive care, advance activity and titrate down oxygen, of note patient is on scheduled Lasix and prednisone at home.  SpO2: 98 % O2 Flow Rate (L/min): 5 L/min FiO2 (%): 50 %   Prolonged QTc > QTc 500 on EKG.   - Avoid QTc prolonging  medications -Keep electrolytes stable, renew low-dose beta-blocker, repeat EKG 07/14/2023 with improvement in QTc   Hypertension - Continue beta-blocker as tolerated by blood pressure   Hyperlipidemia - Continue home atorvastatin   Hypothyroidism - Continue home Synthroid stable TSH.   CKD 3 B > Creatinine slight around 1.4 - Trend renal function and electrolytes   Acute on chronic systolic CHF EF 45%  to 50%. > Has some acute decompensation on 07/14/2023, IV Lasix, low-dose beta-blocker, dose Lasix daily, getting 40 mg BID of IV Lasix on 07/16/2023 along with 25 mg of Aldactone, continue low-dose beta-blocker.  Renal insufficiency precludes the use of ACE/ARB/Entresto.   CAD > Status post PCI per chart - Continue home ASA, metoprolol, atorvastatin   Paroxysmal atrial fibrillation with RVR > Currently sinus rhythm. - Continue home metoprolol, as needed IV Cardizem - Not on any anticoagulation   Thrombocytopenia > Stable at 92 - Trend CBC      Hypoglycemia > Noted to have some hypoglycemia by EMS.  Responded to D10 now hyperglycemic. - Trend CBGs   CBG (last 3)  Recent Labs    07/14/23 0838 07/15/23 0045 07/15/23 2125  GLUCAP 109* 158* 173*         Condition - Extremely Guarded  Family Communication  :  daughter bedside 07/13/23, multiple family members bedside 07/14/2023, family members 07/15/2023, daughter bedside 07/16/2023  Code Status :  DNR  Consults  : Palliative care  PUD Prophylaxis : PPI   Procedures  :     CT -  Marked cardiomegaly. Coronary artery disease. 4.7 cm ascending thoracic aortic aneurysm. Recommend semi-annual imaging followup by CTA or MRA and referral to cardiothoracic surgery if not already obtained.  TTE - 1. Left ventricular ejection fraction, by estimation, is 45 to 50%. The left ventricle has mildly decreased function. The left ventricle has no regional wall motion abnormalities. There is mild concentric left ventricular hypertrophy.  Left ventricular diastolic parameters are indeterminate.  2. Right ventricular systolic function is mildly reduced. The right ventricular size is severely enlarged. There is moderately elevated pulmonary artery systolic pressure. The estimated right ventricular systolic pressure is 46.4 mmHg.  3. Left atrial size was severely dilated.  4. Right atrial size was severely dilated.  5. The mitral valve is normal in structure. Mild mitral valve regurgitation. No evidence of mitral stenosis.  6. Severe, secondary regurgitation with coaptation gap of 0.7 cm. The tricuspid valve is abnormal. Tricuspid valve regurgitation is severe.  7. Severe, central, aortic regurgitation with 2D vena contracta of 0.8 cm and holodiastolic reversal in the descending aortic arch. The aortic valve is tricuspid. Aortic valve regurgitation is severe. Aortic valve sclerosis/calcification is present, without any evidence of aortic stenosis.  8. Pulmonic valve regurgitation is moderate.  9. Aortic dilatation noted. There is mild dilatation of the aortic root, measuring 41 mm. There is mild dilatation of the ascending aorta, measuring 45 mm. 10. The inferior vena cava is dilated in size with <50% respiratory variability, suggesting right atrial pressure of 15 mmHg.      Disposition Plan  :    Status is: Inpt  DVT Prophylaxis  :    heparin injection 5,000 Units Start: 07/12/23 1615    Lab Results  Component Value Date   PLT 104 (L) 07/16/2023    Diet :  Diet Order             DIET DYS 3 Room service appropriate? Yes; Fluid consistency: Nectar Thick  Diet effective now                    Inpatient Medications  Scheduled Meds:  aspirin  81 mg Oral QPM   atorvastatin  40 mg Oral QPM   azithromycin  500 mg Oral Daily   B-complex with vitamin C  1 tablet Oral Daily   vitamin B-12  1,000 mcg Oral Q  lunch   furosemide  40 mg Intravenous Once   heparin  5,000 Units Subcutaneous Q8H   ipratropium-albuterol  3 mL  Nebulization TID   levothyroxine  25 mcg Oral Q0600   methylPREDNISolone (SOLU-MEDROL) injection  60 mg Intravenous Q24H   metoprolol tartrate  25 mg Oral BID   multivitamin with minerals  1 tablet Oral Q lunch   pantoprazole (PROTONIX) IV  40 mg Intravenous Q24H   sodium chloride flush  3 mL Intravenous Q12H   spironolactone  25 mg Oral Once   Continuous Infusions: PRN Meds:.acetaminophen **OR** acetaminophen, diltiazem, guaiFENesin-dextromethorphan, ipratropium-albuterol, nitroGLYCERIN, polyethylene glycol, sodium chloride     Objective:   Vitals:   07/15/23 1500 07/15/23 1600 07/15/23 2000 07/15/23 2100  BP: 111/75 126/61 (!) 112/57   Pulse: 99 94 (!) 106   Resp: (!) 22 (!) 22 (!) 23   Temp:  98 F (36.7 C)  98.7 F (37.1 C)  TempSrc:      SpO2: 96% 97% 98%   Weight:      Height:        Wt Readings from Last 3 Encounters:  07/12/23 54 kg  07/08/23 54 kg  07/31/22 56 kg     Intake/Output Summary (Last 24 hours) at 07/16/2023 0729 Last data filed at 07/16/2023 0634 Gross per 24 hour  Intake --  Output 2200 ml  Net -2200 ml      Physical Exam  Awake Alert, No new F.N deficits, Normal affect [place].AT,PERRAL Supple Neck, No JVD,   Symmetrical Chest wall movement, Good air movement bilaterally, diffuse wheezing and crackles RRR,No Gallops,Rubs or new Murmurs,  +ve B.Sounds, Abd Soft, No tenderness,   No Cyanosis, Clubbing or edema       Data Review:    Recent Labs  Lab 07/12/23 1220 07/12/23 1511 07/13/23 0612 07/14/23 0746 07/15/23 0434 07/16/23 0427  WBC 5.3  --  5.1 3.7* 4.0 4.2  HGB 12.1 11.6* 10.8* 10.7* 10.2* 10.3*  HCT 38.5 34.0* 34.1* 34.3* 31.9* 32.0*  PLT 92*  --  94* 103* 104* 104*  MCV 97.0  --  95.3 95.3 95.8 94.7  MCH 30.5  --  30.2 29.7 30.6 30.5  MCHC 31.4  --  31.7 31.2 32.0 32.2  RDW 16.0*  --  16.0* 15.9* 15.6* 15.3  LYMPHSABS  --   --   --  0.8 0.9 0.8  MONOABS  --   --   --  0.2 0.4 0.3  EOSABS  --   --   --  0.0 0.0 0.0   BASOSABS  --   --   --  0.0 0.0 0.0    Recent Labs  Lab 07/12/23 1220 07/12/23 1511 07/12/23 1631 07/13/23 0612 07/14/23 0746 07/15/23 0434 07/15/23 0856 07/16/23 0427  NA 139 141  --  143 140 139  --  141  K 3.9 3.6  --  4.1 4.3 4.2  --  4.3  CL 99  --   --  99 99 100  --  99  CO2 27  --   --  31 29 28   --  31  ANIONGAP 13  --   --  13 12 11   --  11  GLUCOSE 278*  --   --  117* 99 114*  --  111*  BUN 29*  --   --  26* 31* 37*  --  34*  CREATININE 1.26*  --   --  1.26* 1.31* 1.43*  --  1.22*  AST 62*  --   --  36  --   --   --   --   ALT 29  --   --  24  --   --   --   --   ALKPHOS 90  --   --  81  --   --   --   --   BILITOT 1.1  --   --  0.8  --   --   --   --   ALBUMIN 3.2*  --   --  3.0*  --   --   --   --   CRP  --   --   --  1.5*  --   --  0.7  --   PROCALCITON  --   --  <0.10 <0.10  --   --  <0.10  --   TSH  --   --   --  0.509  --   --   --   --   BNP 613.3*  --   --  498.4* 476.4* 793.6*  --  621.5*  MG  --   --   --  2.1 2.3 2.2  --  2.2  PHOS  --   --   --   --  2.9  --   --   --   CALCIUM 8.8*  --   --  8.8* 8.9 8.8*  --  8.7*      Recent Labs  Lab 07/12/23 1220 07/12/23 1631 07/13/23 0612 07/14/23 0746 07/15/23 0434 07/15/23 0856 07/16/23 0427  CRP  --   --  1.5*  --   --  0.7  --   PROCALCITON  --  <0.10 <0.10  --   --  <0.10  --   TSH  --   --  0.509  --   --   --   --   BNP 613.3*  --  498.4* 476.4* 793.6*  --  621.5*  MG  --   --  2.1 2.3 2.2  --  2.2  CALCIUM 8.8*  --  8.8* 8.9 8.8*  --  8.7*    --------------------------------------------------------------------------------------------------------------- Lab Results  Component Value Date   CHOL 146 12/15/2012   HDL 64.10 12/15/2012   LDLCALC 71 12/15/2012   TRIG 54.0 12/15/2012   CHOLHDL 2 12/15/2012      Micro Results Recent Results (from the past 240 hours)  Resp panel by RT-PCR (RSV, Flu A&B, Covid) Anterior Nasal Swab     Status: None   Collection Time: 07/08/23  8:37 PM    Specimen: Anterior Nasal Swab  Result Value Ref Range Status   SARS Coronavirus 2 by RT PCR NEGATIVE NEGATIVE Final   Influenza A by PCR NEGATIVE NEGATIVE Final   Influenza B by PCR NEGATIVE NEGATIVE Final    Comment: (NOTE) The Xpert Xpress SARS-CoV-2/FLU/RSV plus assay is intended as an aid in the diagnosis of influenza from Nasopharyngeal swab specimens and should not be used as a sole basis for treatment. Nasal washings and aspirates are unacceptable for Xpert Xpress SARS-CoV-2/FLU/RSV testing.  Fact Sheet for Patients: BloggerCourse.com  Fact Sheet for Healthcare Providers: SeriousBroker.it  This test is not yet approved or cleared by the [place]  FDA and has been authorized for detection and/or diagnosis of SARS-CoV-2 by FDA under an Emergency Use Authorization (EUA). This EUA will remain in effect (meaning this test can be used) for the duration of the COVID-19 declaration under Section  564(b)(1) of the Act, 21 U.S.C. section 360bbb-3(b)(1), unless the authorization is terminated or revoked.     Resp Syncytial Virus by PCR NEGATIVE NEGATIVE Final    Comment: (NOTE) Fact Sheet for Patients: BloggerCourse.com  Fact Sheet for Healthcare Providers: SeriousBroker.it  This test is not yet approved or cleared by the [place] FDA and has been authorized for detection and/or diagnosis of SARS-CoV-2 by FDA under an Emergency Use Authorization (EUA). This EUA will remain in effect (meaning this test can be used) for the duration of the COVID-19 declaration under Section 564(b)(1) of the Act, 21 U.S.C. section 360bbb-3(b)(1), unless the authorization is terminated or revoked.  Performed at [facility], [address], [place], [place] [address]   Respiratory (~20 pathogens) panel by PCR     Status: Abnormal   Collection Time: 07/12/23  4:09 PM   Specimen:  Nasopharyngeal Swab; Respiratory  Result Value Ref Range Status   Adenovirus NOT DETECTED NOT DETECTED Final   Coronavirus 229E NOT DETECTED NOT DETECTED Final    Comment: (NOTE) The Coronavirus on the Respiratory Panel, DOES NOT test for the novel  Coronavirus (2019 nCoV)    Coronavirus HKU1 NOT DETECTED NOT DETECTED Final   Coronavirus NL63 NOT DETECTED NOT DETECTED Final   Coronavirus OC43 NOT DETECTED NOT DETECTED Final   Metapneumovirus DETECTED (A) NOT DETECTED Final   Rhinovirus / Enterovirus NOT DETECTED NOT DETECTED Final   Influenza A NOT DETECTED NOT DETECTED Final   Influenza B NOT DETECTED NOT DETECTED Final   Parainfluenza Virus 1 NOT DETECTED NOT DETECTED Final   Parainfluenza Virus 2 NOT DETECTED NOT DETECTED Final   Parainfluenza Virus 3 NOT DETECTED NOT DETECTED Final   Parainfluenza Virus 4 NOT DETECTED NOT DETECTED Final   Respiratory Syncytial Virus NOT DETECTED NOT DETECTED Final   Bordetella pertussis NOT DETECTED NOT DETECTED Final   Bordetella Parapertussis NOT DETECTED NOT DETECTED Final   Chlamydophila pneumoniae NOT DETECTED NOT DETECTED Final   Mycoplasma pneumoniae NOT DETECTED NOT DETECTED Final    Comment: Performed at [facility], [address], [place], [place] [address]  Blood culture (routine x 2)     Status: None (Preliminary result)   Collection Time: 07/12/23  4:51 PM   Specimen: BLOOD RIGHT FOREARM  Result Value Ref Range Status   Specimen Description BLOOD RIGHT FOREARM  Final   Special Requests   Final    BOTTLES DRAWN AEROBIC AND ANAEROBIC Blood Culture results may not be optimal due to an inadequate volume of blood received in culture bottles   Culture   Final    NO GROWTH 3 DAYS Performed at [facility], [address] [place], [place] [address]    Report Status PENDING  Incomplete  Blood culture (routine x 2)     Status: None (Preliminary result)   Collection Time: 07/12/23  8:56 PM   Specimen: BLOOD LEFT HAND   Result Value Ref Range Status   Specimen Description BLOOD LEFT HAND  Final   Special Requests   Final    BOTTLES DRAWN AEROBIC ONLY Blood Culture results may not be optimal due to an inadequate volume of blood received in culture bottles   Culture   Final    NO GROWTH 3 DAYS Performed at [facility], [address], [place], [place] [address]    Report Status PENDING  Incomplete    Radiology Report DG Chest Port 1 View Result Date: 07/16/2023 CLINICAL  DATA:  [id number] with shortness of breath. EXAM: PORTABLE CHEST 1 VIEW COMPARISON:  Portable chest yesterday at 6:00 a.m. FINDINGS: 6:40 a.m. Left retrocardiac consolidation centered laterally continues to be seen. Lungs are otherwise clear. The heart is moderately enlarged. No vascular congestion is seen. The mediastinum is stable with aortic tortuosity calcification. There is no substantial pleural effusion. There is osteopenia with dextroscoliosis and degenerative change of the spine, chronic rotator cuff arthropathy. IMPRESSION: 1. Left retrocardiac consolidation centered laterally continues to be seen. Overall aeration seems unchanged. 2. Cardiomegaly without evidence of vascular congestion. 3. Aortic atherosclerosis. Electronically Signed   By: Denman Fischer M.D.   On: 07/16/2023 06:58   DG Chest Port 1 View Result Date: 07/15/2023 CLINICAL DATA:  Shortness of breath. EXAM: PORTABLE CHEST 1 VIEW COMPARISON:  07/13/2023 FINDINGS: The cardio pericardial silhouette is enlarged. There is pulmonary vascular congestion without overt pulmonary edema. Retrocardiac left base collapse/consolidation is similar to prior. Bones are diffusely demineralized. Telemetry leads overlie the chest. IMPRESSION: 1. Enlargement of the cardiopericardial silhouette with pulmonary vascular congestion. 2. Retrocardiac left base collapse/consolidation, similar to prior. Electronically Signed   By: Donnal Fusi M.D.   On: 07/15/2023 06:20     Signature  -    Lynnwood Sauer M.D on 07/16/2023 at 7:29 AM   -  To page go to [email or web address]

## 2023-07-16 NOTE — Progress Notes (Signed)
 PT Cancellation Note  Patient Details Name: Julie Lara MRN: [id number] DOB: 1920-08-08   Cancelled Treatment:    Reason Eval/Treat Not Completed: Patient at procedure or test/unavailable  Off unit for testing. Will check back later today for PT follow-up.  Jory Ng, PT, DPT [facility]  Rehabilitation Services Physical Therapist Office: [phone] Website: [email or web address]   Alinda Irani 07/16/2023, 9:28 AM

## 2023-07-16 NOTE — Progress Notes (Signed)
 Daily Progress Note   Patient Name: Julie Lara       Date: 07/16/2023 DOB: [birth year]  Age: 89 y.o. MRN#: [id number] Attending Physician: Leroy Sea, MD Primary Care Physician: Geoffry Paradise, MD Admit Date: 07/12/2023 Length of Stay: 3 days  Reason for Consultation/Follow-up: Establishing goals of care  HPI/Patient Profile:  89 y.o. female with past medical history of hypertension, hyperlipidemia, hypothyroidism, CKD 3, chronic systolic CHF, CAD, atrial fibrillation, anemia presented to the ED on 07/12/2023 with complaints of shortness of breath. She was recently seen in ED on 07/03/23 for same - work up was unremarkable and she was sent home.   Palliative medicine was consulted for GOC conversations.  Subjective:   Subjective: Chart Reviewed. Updates received. Patient Assessed. Created space and opportunity for patient  and family to explore thoughts and feelings regarding current medical situation.  Today's Discussion: Today before seeing the patient I reviewed the chart for updates since our last palliative visit.  Her vitals have been stable, seems slightly improved with 98% sats on 5 L/min.  She is a bit tachypneic averaging 22-30 breaths per minute today.    Today saw the patient at bedside, she was awake and greeted me warmly.  Her daughter Julie Lara is at the bedside.  The patient feels okay today, denies shortness of breath or pain today.  With her permission I excused myself in the room with Julie Lara for further conversation outside the room.  Julie Lara feels that her mother is improving somewhat since the past couple days.  She did have a meeting with Julie Lara with [facility] collective to discuss differences between hospice and outpatient palliative care, which she feels went well.  She admits she has not had time to review the information that Julie Lara left with her as well as discussed with other family members.  She discusses the patient's baseline where she uses a walker  independently.  Her bathroom is close to where she sleeps, which is in the living room on the sofa.  Julie Lara is her primary caregiver and cooks for her.  The patient has maintained her weight, even if her appetite is not always great.  She is able to use a wheelchair to get out to the car where Julie Lara helps her get into the car and they go out on shopping trips and she helps the patient complete things that she wants to do.  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  Apparently she has completed 70 of these for various family and friends.  I encouraged Julie Lara to consider hospice care and review the information provided by [employer].  I shared that I would be back tomorrow to check on the patient we can have further discussions at that time as well.  I ensured she had our contact information for any questions or concerns.  I provided emotional and general support through therapeutic listening, empathy, sharing of stories, and other techniques. I answered all questions and addressed all concerns to the best of my ability.  Review of Systems  Constitutional:        Denies pain in general  Respiratory:  Negative for cough and shortness of breath.   Cardiovascular:  Negative for chest pain.  Gastrointestinal:  Negative for abdominal pain, nausea and vomiting.    Objective:   Vital Lara:  BP 128/63 (BP Location: Right Arm)   Pulse (!) 115   Temp 97.7 F (36.5 C) (Axillary)   Resp (!) 25  Ht 5\' 4"  (1.626 m)   Wt 54 kg   SpO2 96%   BMI 20.43 kg/m   Physical Exam Vitals and nursing note reviewed.  Constitutional:      General: She is not in acute distress.    Appearance: She is ill-appearing. She is not toxic-appearing.  HENT:     Head: Normocephalic and atraumatic.  Cardiovascular:     Rate and Rhythm: Normal rate.  Pulmonary:     Effort: Pulmonary effort is normal. No respiratory distress.  Abdominal:     General: Abdomen is flat.  Skin:     General: Skin is warm and dry.  Neurological:     General: No focal deficit present.     Mental Status: She is alert.  Psychiatric:        Mood and Affect: Mood normal.        Behavior: Behavior normal.     Palliative Assessment/Data: 40-50%    Existing Vynca/ACP Documentation: DNR effective/15/2025  Assessment & Plan:   Impression: Present on Admission:  Essential hypertension  HLD (hyperlipidemia)  Hypothyroidism  CKD (chronic kidney disease), stage III (HCC)  Chronic systolic congestive heart failure (HCC)  CAD (coronary artery disease)  Atrial fibrillation (HCC)  Anemia  Acute respiratory failure with hypoxia (HCC)  SUMMARY OF RECOMMENDATIONS   DNR-limited Continue current comfort care/treating treatable Ongoing GOC discussions after family has reviewed hospice versus palliative care information Palliative medicine will continue to follow  Symptom Management:  Per primary team BMT available assist as needed  Code Status: DNR-limited  Prognosis: Unable to determine  Discharge Planning: To Be Determined  Discussed with: Patient, family, medical team, nursing team  Thank you for allowing us  to participate in the care of Julie Lara PMT will continue to support holistically.  Time Total: 52 min  Detailed review of medical records (labs, imaging, vital Lara), medically appropriate exam, discussed with treatment team, counseling and education to patient, family, & staff, documenting clinical information, medication management, coordination of care  Lizbeth Right, NP Palliative Medicine Team  Team Phone # [phone] (Nights/Weekends)  11/29/2020, 8:17 AM

## 2023-07-17 DIAGNOSIS — J9601 Acute respiratory failure with hypoxia: Secondary | ICD-10-CM | POA: Diagnosis not present

## 2023-07-17 DIAGNOSIS — Z66 Do not resuscitate: Secondary | ICD-10-CM | POA: Diagnosis not present

## 2023-07-17 DIAGNOSIS — Z515 Encounter for palliative care: Secondary | ICD-10-CM | POA: Diagnosis not present

## 2023-07-17 DIAGNOSIS — Z7189 Other specified counseling: Secondary | ICD-10-CM | POA: Diagnosis not present

## 2023-07-17 LAB — BASIC METABOLIC PANEL WITH GFR
Anion gap: 9 (ref 5–15)
BUN: 38 mg/dL — ABNORMAL HIGH (ref 8–23)
CO2: 32 mmol/L (ref 22–32)
Calcium: 9 mg/dL (ref 8.9–10.3)
Chloride: 98 mmol/L (ref 98–111)
Creatinine, Ser: 1.26 mg/dL — ABNORMAL HIGH (ref 0.44–1.00)
GFR, Estimated: 38 mL/min — ABNORMAL LOW (ref >60)
Glucose, Bld: 101 mg/dL — ABNORMAL HIGH (ref 70–99)
Potassium: 4.1 mmol/L (ref 3.5–5.1)
Sodium: 139 mmol/L (ref 135–145)

## 2023-07-17 LAB — CBC WITH DIFFERENTIAL/PLATELET
Abs Immature Granulocytes: 0.08 10*3/uL — ABNORMAL HIGH (ref 0.00–0.07)
Basophils Absolute: 0 10*3/uL (ref 0.0–0.1)
Basophils Relative: 0 %
Eosinophils Absolute: 0 10*3/uL (ref 0.0–0.5)
Eosinophils Relative: 0 %
HCT: 31.8 % — ABNORMAL LOW (ref 36.0–46.0)
Hemoglobin: 10.5 g/dL — ABNORMAL LOW (ref 12.0–15.0)
Immature Granulocytes: 2 %
Lymphocytes Relative: 19 %
Lymphs Abs: 1 10*3/uL (ref 0.7–4.0)
MCH: 30.6 pg (ref 26.0–34.0)
MCHC: 33 g/dL (ref 30.0–36.0)
MCV: 92.7 fL (ref 80.0–100.0)
Monocytes Absolute: 0.5 10*3/uL (ref 0.1–1.0)
Monocytes Relative: 11 %
Neutro Abs: 3.4 10*3/uL (ref 1.7–7.7)
Neutrophils Relative %: 68 %
Platelets: 111 10*3/uL — ABNORMAL LOW (ref 150–400)
RBC: 3.43 MIL/uL — ABNORMAL LOW (ref 3.87–5.11)
RDW: 15.3 % (ref 11.5–15.5)
WBC: 5 10*3/uL (ref 4.0–10.5)
nRBC: 0 % (ref 0.0–0.2)

## 2023-07-17 LAB — GLUCOSE, CAPILLARY
Glucose-Capillary: 129 mg/dL — ABNORMAL HIGH (ref 70–99)
Glucose-Capillary: 130 mg/dL — ABNORMAL HIGH (ref 70–99)

## 2023-07-17 LAB — PHOSPHORUS: Phosphorus: 3.3 mg/dL (ref 2.5–4.6)

## 2023-07-17 LAB — CULTURE, BLOOD (ROUTINE X 2)
Culture: NO GROWTH
Culture: NO GROWTH

## 2023-07-17 LAB — BRAIN NATRIURETIC PEPTIDE: B Natriuretic Peptide: 540.4 pg/mL — ABNORMAL HIGH (ref 0.0–100.0)

## 2023-07-17 LAB — MAGNESIUM: Magnesium: 2.2 mg/dL (ref 1.7–2.4)

## 2023-07-17 MED ORDER — FUROSEMIDE 10 MG/ML IJ SOLN
40.0000 mg | Freq: Two times a day (BID) | INTRAMUSCULAR | Status: AC
  Administered 2023-07-17 (×2): 40 mg via INTRAVENOUS
  Filled 2023-07-17 (×2): qty 4

## 2023-07-17 MED ORDER — HYDROXYZINE HCL 25 MG PO TABS
25.0000 mg | ORAL_TABLET | Freq: Every evening | ORAL | Status: DC | PRN
  Administered 2023-07-18 – 2023-07-19 (×2): 25 mg via ORAL
  Filled 2023-07-17 (×2): qty 1

## 2023-07-17 MED ORDER — BOOST PLUS PO LIQD
237.0000 mL | Freq: Two times a day (BID) | ORAL | Status: DC
  Administered 2023-07-17 – 2023-07-19 (×5): 237 mL via ORAL
  Filled 2023-07-17 (×7): qty 237

## 2023-07-17 MED ORDER — SODIUM CHLORIDE 3 % IN NEBU
4.0000 mL | INHALATION_SOLUTION | Freq: Two times a day (BID) | RESPIRATORY_TRACT | Status: AC
  Administered 2023-07-17 – 2023-07-19 (×6): 4 mL via RESPIRATORY_TRACT
  Filled 2023-07-17 (×6): qty 4

## 2023-07-17 MED ORDER — HYDROXYZINE HCL 25 MG PO TABS
25.0000 mg | ORAL_TABLET | Freq: Once | ORAL | Status: AC
  Administered 2023-07-17: 25 mg via ORAL
  Filled 2023-07-17: qty 1

## 2023-07-17 MED ORDER — MELATONIN 3 MG PO TABS
3.0000 mg | ORAL_TABLET | Freq: Every day | ORAL | Status: DC
  Administered 2023-07-17: 3 mg via ORAL
  Filled 2023-07-17 (×2): qty 1

## 2023-07-17 NOTE — Plan of Care (Signed)

## 2023-07-17 NOTE — Progress Notes (Signed)
 Contact by RN for PRN breathing tx.  Upon entering room pt sleeping peacefully, no respiratory distress observed.  Will hold tx for now.

## 2023-07-17 NOTE — Progress Notes (Signed)
 Speech Language Pathology Treatment: Dysphagia  Patient Details Name: Julie Lara MRN: [id number] DOB: [birth year] Today's Date: 07/17/2023 Time: 1191-4782 SLP Time Calculation (min) (ACUTE ONLY): 24 min  Assessment / Plan / Recommendation Clinical Impression  Pt was seen for dysphagia treatment focused on compensatory strategy training. Pt's family was present during session. Pt was observed with nectar-thick liquid during cup and straw sips, displaying only signs of delayed coughing. Pt's coughing sounded wet. SLP instructed pt on compensatory strategies to optimize pt's airway safety (chin-tuck, multiple swallows, small sips). Pt was 90% consistent with performing compensatory strategies given mod fading to min verbal cueing. Pt's daughter was educated on how to provide cueing for compensatory strategies and was given opportunities to demonstrate understanding.   SLP will f/u to reinforce compensatory strategies and continue education with family to maximize pt's swallow safety and efficiency for future levels of care.    HPI HPI: Patient is 89 yo female admitted on 07/12/23 for acute respioratory failure with hypoxia. PMH significant for HTN, hyperlipidemia, hypothyroidism, CKD 3, chronic systolic CHF, CAD, atrial fibrillation, and anemia.Pt has a hx of GERD with two previous esophageal barium studies (one in 2018 and one in 2013), both indicate a moderate reflux. Per family reports, the pt takes medication for reflux. Pt has hx of thyroidectomy in 2008, where there was noted vocal changes after the procedure.      SLP Plan  Continue with current plan of care      Recommendations for follow up therapy are one component of a multi-disciplinary discharge planning process, led by the attending physician.  Recommendations may be updated based on patient status, additional functional criteria and insurance authorization.    Recommendations  Compensations: Slow rate;Small  sips/bites;Multiple dry swallows after each bite/sip;Chin tuck                  Oral care BID   Intermittent Supervision/Assistance Dysphagia, pharyngeal phase (R13.13)     Continue with current plan of care     Julie Lara  07/17/2023, 12:02 PM

## 2023-07-17 NOTE — Progress Notes (Signed)
 Initial Nutrition Assessment  DOCUMENTATION CODES:   Severe malnutrition in context of chronic illness  INTERVENTION:  Boost Plus po BID, each supplement provides 360 kcal and 14 grams of protein  Magic cup TID with meals, each supplement provides 290 kcal and 9 grams of protein  Continue MVI w/ minerals Recommend daily weights to assess weight trend while diuresing  NUTRITION DIAGNOSIS:  Severe Malnutrition related to chronic illness (CHF, CKDIII, CAD) as evidenced by moderate fat depletion, severe fat depletion, severe muscle depletion, moderate muscle depletion.  GOAL:  Patient will meet greater than or equal to 90% of their needs  MONITOR:  PO intake, Diet advancement, Supplement acceptance  REASON FOR ASSESSMENT:  Rounds   ASSESSMENT:  Pt is 89 year old female PMH significant for: HTN, HLD, hypothyroidism, CKDIII, CHF, CAD, afib, and anemia. Admitted d/t worsening SOB. W/u suggestive of acute hypoxic respiratory failure d/t metapneumovirus PNA.  Average Meal Intake 4/16: 100% x1 documented meal  Family at bedside during visit today. They endorse that appetite had declined in week or two leading up to admission, but patient has been with baseline appetite/intake since admission. Prefers foods like vegetables, baked sweet and white potatoes, salads, and fruit. Does consume Boost at home. They are amicable to reorder here. Will need to be thickened to nectar thick consistency. Speech engaged.  Discussed with the family the need for adequate calories and protein to prevent loss of lean body mass and preventing skin breakdown. They verbalized understanding.   Admit/Current Weight: 54kg  Per chart review, patient UBW over last year has ranged between 54-56kg. Family stating as around 120lbs one month ago. Current weight is in line with this. IV diuresis continues.     Net IO Since Admission: -2,960 mL [07/17/23 1345]   No BM x2 days. Recommend scheduled bowel regimen, if she  does not go in next day. Some moderate edema to BLEs.  Meds: B-complex, cyanocobalamin, furosemide, levothyroxine, melatonin, Solu-medrol, MVI, pantoprazole   Labs: Na+ 141>139 (wdl) K+ 4.1 (wdl) CBGs 101-111 x24 hours  NUTRITION - FOCUSED PHYSICAL EXAM:  Flowsheet Row Most Recent Value  Orbital Region Severe depletion  Upper Arm Region Severe depletion  Thoracic and Lumbar Region Moderate depletion  Buccal Region Moderate depletion  Temple Region Severe depletion  Clavicle Bone Region Severe depletion  Clavicle and Acromion Bone Region Severe depletion  Scapular Bone Region Severe depletion  Dorsal Hand Severe depletion  Patellar Region Moderate depletion  Anterior Thigh Region Moderate depletion  Posterior Calf Region Moderate depletion  Edema (RD Assessment) Moderate  Hair Reviewed  Eyes Unable to assess  Mouth Reviewed  Skin Reviewed  Nails Reviewed   Diet Order:   Diet Order             DIET DYS 3 Room service appropriate? Yes; Fluid consistency: Nectar Thick  Diet effective now             EDUCATION NEEDS:   Education needs have been addressed  Skin:  Skin Assessment: Reviewed RN Assessment  Last BM:  4/14 per family report  Height:  Ht Readings from Last 1 Encounters:  07/12/23 5\' 4"  (1.626 m)   Weight:  Wt Readings from Last 1 Encounters:  07/12/23 54 kg   Ideal Body Weight:  54.5 kg  BMI:  Body mass index is 20.43 kg/m.  Estimated Nutritional Needs:   Kcal:  1400-1600kcal  Protein:  65-80g  Fluid:  >1.4L/day  Con Decant MS, RD, LDN Registered Dietitian Clinical Nutrition RD  Inpatient Contact Info in Amion

## 2023-07-17 NOTE — Progress Notes (Signed)
   07/17/23 0927  Mobility  Activity Ambulated with assistance to bathroom  Level of Assistance Minimal assist, patient does 75% or more  Assistive Device Front wheel walker  Distance Ambulated (ft) 10 ft  Activity Response Tolerated fair  Mobility Referral Yes  Mobility visit 1 Mobility  Mobility Specialist Start Time (ACUTE ONLY) [id number]  Mobility Specialist Stop Time (ACUTE ONLY) 0947  Mobility Specialist Time Calculation (min) (ACUTE ONLY) 20 min   Mobility Specialist: Progress Note  Pre-Mobility:      HR 108, SpO2 99% 4L During Mobility: HR 118, SpO2 97% RA  Pt agreeable to mobility session - received in bed - requested by nurse to walk pt to BR to get ready for shower. Pt with mouth breathing given PLB cues. Pt with congested cough throughout.. Returned to shower chair in BR with all needs met - encouraged to use pull string when finished.  Left with granddaughter, daughter and NT present.   Isla Mari, BS Mobility Specialist Please contact via SecureChat or  Rehab office at [phone].

## 2023-07-17 NOTE — Progress Notes (Signed)
 PT Cancellation Note  Patient Details Name: Julie Lara MRN: [id number] DOB: [birth year]   Cancelled Treatment:    Reason Eval/Treat Not Completed: Fatigue/lethargy limiting ability to participate. Pt showered this morning with family assist. MT assisted into shower and RN assisted out of shower. Pt very fatigued. Resting HR in 100s. PT to re-attempt as time allows.   Guadelupe Leech 07/17/2023, 11:11 AM

## 2023-07-17 NOTE — Progress Notes (Signed)
   07/17/23 1158  Spiritual Encounters  Type of Visit Initial  Care provided to: [employer] partners present during encounter Nurse  Reason for visit Advance directives  OnCall Visit No   Request received for advance directive education to daughter. Provided education, however daughter did not want patient to know she was inquiring. Left documentation with daughter.

## 2023-07-17 NOTE — Progress Notes (Signed)
 Daily Progress Note   Patient Name: Julie Lara       Date: 07/17/2023 DOB: [birth year]  Age: 89 y.o. MRN#: [id number] Attending Physician: Epifanio Haste, MD Primary Care Physician: Suan Elm, MD Admit Date: 07/12/2023 Length of Stay: 4 days  Reason for Consultation/Follow-up: Establishing goals of care  HPI/Patient Profile:  89 y.o. female with past medical history of hypertension, hyperlipidemia, hypothyroidism, CKD 3, chronic systolic CHF, CAD, atrial fibrillation, anemia presented to the ED on 07/12/2023 with complaints of shortness of breath. She was recently seen in ED on 07/03/23 for same - work up was unremarkable and she was sent home.   Palliative medicine was consulted for GOC conversations.  Subjective:   Subjective: Chart Reviewed. Updates received. Patient Assessed. Created space and opportunity for patient  and family to explore thoughts and feelings regarding current medical situation.  Today's Discussion: Today before seeing the patient I reviewed the chart for updates since our last palliative visit.  SLP saw the patient and recommended dysphagia 3 diet with honey thick liquids and plans to come back today to assist with compensatory mechanisms for swallowing.  Her vitals have been stable.  Yesterday she ambulated 28 feet with a rolling walker and minimum assist with PT and saturations maintained 90% or greater on 4 L/min nasal cannula.  Today saw the patient at bedside, she was awake and greeted me warmly.  Her daughter Adriana Hopping is at the bedside along with daughter Haskell Linker and a granddaughter.  The patient is a bit fatigued today as she has had a big day.  She ambulated with staff earlier and then had a shower/bath with family assistance.  We spent some time talking about comparison of palliative care versus hospice (which they call "[facility]" so patient does not become scared by the word "hospice").  Overall family seems to be leaning towards hospice because  of the increased services and support they would offer.  They understand palliative care is only intermittent checking in and assist with conversation and goals of care, typically every 3 to 4 weeks.  Hospice care would provide some hands-on care, medical management, and very important to them 24-hour access for urgent clinical changes.  They are also considering +/- personal care assistance support out-of-pocket.  They feel that this increase support with hospice services would help the patient's daughter Erby Hatcher, primary caregiver, with taking care of the patient at home.  They never want to put her in a facility and states that she will never be without family present.  However, they do still need to continue to discuss amongst themselves.  I encouraged family to consider these discussions, reach out to palliative medicine with any questions or concerns.  I ensured they had our contact information.  I provided emotional and general support through therapeutic listening, empathy, sharing of stories, and other techniques. I answered all questions and addressed all concerns to the best of my ability.  Review of Systems  Constitutional:  Positive for fatigue.       Denies pain in general  Respiratory:  Negative for cough and shortness of breath.   Cardiovascular:  Negative for chest pain.  Gastrointestinal:  Negative for abdominal pain, nausea and vomiting.    Objective:   Vital Signs:  BP (!) 123/99   Pulse 88   Temp (!) 97.5 F (36.4 C)   Resp (!) 24   Ht 5\' 4"  (1.626 m)   Wt 54 kg   SpO2 93%   BMI 20.43  kg/m   Physical Exam Vitals and nursing note reviewed.  Constitutional:      General: She is not in acute distress.    Appearance: She is ill-appearing. She is not toxic-appearing.  HENT:     Head: Normocephalic and atraumatic.  Cardiovascular:     Rate and Rhythm: Normal rate.  Pulmonary:     Effort: Pulmonary effort is normal. No respiratory distress.     Comments: Noted  productive cough during my visit and desats into the mid 80s during coughing, quickly rebounds into the mid to upper 90s Abdominal:     General: Abdomen is flat.  Skin:    General: Skin is warm and dry.  Neurological:     General: No focal deficit present.     Mental Status: She is alert.  Psychiatric:        Mood and Affect: Mood normal.        Behavior: Behavior normal.     Palliative Assessment/Data: 60-70%    Existing Vynca/ACP Documentation: DNR effective/15/2025  Assessment & Plan:   Impression: Present on Admission:  Essential hypertension  HLD (hyperlipidemia)  Hypothyroidism  CKD (chronic kidney disease), stage III (HCC)  Chronic systolic congestive heart failure (HCC)  CAD (coronary artery disease)  Atrial fibrillation (HCC)  Anemia  Acute respiratory failure with hypoxia (HCC)  SUMMARY OF RECOMMENDATIONS   DNR-limited Continue current comfort care/treating treatable Ongoing interfamily discussions, seems to be leaning toward hospice care Palliative medicine will follow-up tomorrow for further discussions  Symptom Management:  Per primary team BMT available assist as needed  Code Status: DNR-limited  Prognosis: Unable to determine  Discharge Planning: To Be Determined  Discussed with: Patient, family, medical team, nursing team  Thank you for allowing us  to participate in the care of YASMEN CORTNER PMT will continue to support holistically.  Time Total: 51 min  Detailed review of medical records (labs, imaging, vital signs), medically appropriate exam, discussed with treatment team, counseling and education to patient, family, & staff, documenting clinical information, medication management, coordination of care  Lizbeth Right, NP Palliative Medicine Team  Team Phone # [phone] (Nights/Weekends)  11/29/2020, 8:17 AM

## 2023-07-17 NOTE — Progress Notes (Signed)
 PROGRESS NOTE                                                                                                                                                                                                             Patient Demographics:    Julie Lara, is a 89 y.o. female, DOB - 1920/06/11, ZOX:[id number]  Outpatient Primary MD for the patient is Geoffry Paradise, MD    LOS - 4  Admit date - 07/12/2023    Chief Complaint  Patient presents with   Shortness of Breath   Weakness       Brief Narrative (HPI from H&P)     89 y.o. female with medical history significant of hypertension, hyperlipidemia, hypothyroidism, CKD 3, chronic systolic CHF, CAD, atrial fibrillation, anemia presenting with worsening shortness of breath.  Workup suggestive of acute hypoxic respiratory failure due to metapneumovirus pNA.   Subjective:   Patient in bed, appears comfortable, some difficulty sleeping overnight where she required hydralazine, still having cough, but difficult for help to produce phlegm, she remains on oxygen     Assessment  & Plan :    Acute respiratory failure with hypoxia due to metapneumovirus pneumonia. - Work of breathing requiring BiPAP initially, continue with as needed. - Continue to wean oxygen as tolerated, this morning she is on 4 L. - Continue with azithromycin due to concern of bacterial coinfection -She was encouraged to use incentive spirometry, flutter valve, she will need NTS but for family would like to hold on that for now, so we will do hypertonic saline nebulizers - Still significant Rales bilaterally, continue with IV diuresis, will give 40 mg IV twice daily today - Continue with IV steroids.  Start on PPI for GI prophylaxis -Discussed with family, she is high risk for hospital delirium, they will allow for circadian rhythm, to keep her engaged and interactive daytime, will do melatonin scheduled  at bedtime and as needed hydroxyzine - of note patient is on scheduled Lasix and prednisone at home.  SpO2: 93 % O2 Flow Rate (L/min): 5 L/min FiO2 (%): 50 %   Prolonged QTc > QTc 500 on EKG.   - Avoid QTc prolonging medications -Keep electrolytes stable, renew low-dose beta-blocker, repeat EKG 07/14/2023 with improvement in QTc   Hypertension - Continue beta-blocker as tolerated by blood pressure   Hyperlipidemia -  Continue home atorvastatin   Hypothyroidism - Continue home Synthroid stable TSH.   CKD 3 B > Creatinine slight around 1.4 - Trend renal function and electrolytes   Acute on chronic systolic CHF EF 45% to 50%. > Has some acute decompensation on 07/14/2023, IV Lasix, low-dose beta-blocker, dose Lasix daily, get 40 mg of IV Lasix twice daily with low-dose Aldactone continue low-dose beta-blocker.  Renal insufficiency precludes the use of ACE/ARB/Entresto.   CAD > Status post PCI per chart - Continue home ASA, metoprolol, atorvastatin   Paroxysmal atrial fibrillation with RVR > Currently sinus rhythm. - Continue home metoprolol, as needed IV Cardizem - Not on any anticoagulation   Thrombocytopenia > Stable at 92 - Trend CBC      Hypoglycemia > Noted to have some hypoglycemia by EMS.  Responded to D10 now hyperglycemic. - Trend CBGs   CBG (last 3)  Recent Labs    07/15/23 2125 07/16/23 0802 07/17/23 0038  GLUCAP 173* 129* 130*         Condition - Extremely Guarded  Family Communication  : Discussed with both granddaughters at bedside  Code Status :  DNR  Consults  : Palliative care  PUD Prophylaxis : PPI   Procedures  :     CT -  Marked cardiomegaly. Coronary artery disease. 4.7 cm ascending thoracic aortic aneurysm. Recommend semi-annual imaging followup by CTA or MRA and referral to cardiothoracic surgery if not already obtained.  TTE - 1. Left ventricular ejection fraction, by estimation, is 45 to 50%. The left ventricle has mildly  decreased function. The left ventricle has no regional wall motion abnormalities. There is mild concentric left ventricular hypertrophy. Left ventricular diastolic parameters are indeterminate.  2. Right ventricular systolic function is mildly reduced. The right ventricular size is severely enlarged. There is moderately elevated pulmonary artery systolic pressure. The estimated right ventricular systolic pressure is 46.4 mmHg.  3. Left atrial size was severely dilated.  4. Right atrial size was severely dilated.  5. The mitral valve is normal in structure. Mild mitral valve regurgitation. No evidence of mitral stenosis.  6. Severe, secondary regurgitation with coaptation gap of 0.7 cm. The tricuspid valve is abnormal. Tricuspid valve regurgitation is severe.  7. Severe, central, aortic regurgitation with 2D vena contracta of 0.8 cm and holodiastolic reversal in the descending aortic arch. The aortic valve is tricuspid. Aortic valve regurgitation is severe. Aortic valve sclerosis/calcification is present, without any evidence of aortic stenosis.  8. Pulmonic valve regurgitation is moderate.  9. Aortic dilatation noted. There is mild dilatation of the aortic root, measuring 41 mm. There is mild dilatation of the ascending aorta, measuring 45 mm. 10. The inferior vena cava is dilated in size with <50% respiratory variability, suggesting right atrial pressure of 15 mmHg.      Disposition Plan  :    Status is: Inpt  DVT Prophylaxis  :    heparin injection 5,000 Units Start: 07/12/23 1615    Lab Results  Component Value Date   PLT 111 (L) 07/17/2023    Diet :  Diet Order             DIET DYS 3 Room service appropriate? Yes; Fluid consistency: Nectar Thick  Diet effective now                    Inpatient Medications  Scheduled Meds:  aspirin  81 mg Oral QPM   atorvastatin  40 mg Oral QPM   B-complex with  vitamin C  1 tablet Oral Daily   vitamin B-12  1,000 mcg Oral Q lunch   furosemide   40 mg Intravenous Q12H   heparin  5,000 Units Subcutaneous Q8H   ipratropium-albuterol  3 mL Nebulization TID   levothyroxine  25 mcg Oral Q0600   melatonin  3 mg Oral QHS   methylPREDNISolone (SOLU-MEDROL) injection  60 mg Intravenous Q24H   metoprolol tartrate  50 mg Oral BID   multivitamin with minerals  1 tablet Oral Q lunch   pantoprazole (PROTONIX) IV  40 mg Intravenous Q24H   sodium chloride flush  3 mL Intravenous Q12H   sodium chloride HYPERTONIC  4 mL Nebulization BID   Continuous Infusions: PRN Meds:.acetaminophen **OR** acetaminophen, diltiazem, guaiFENesin-dextromethorphan, hydrOXYzine, ipratropium-albuterol, nitroGLYCERIN, polyethylene glycol, sodium chloride     Objective:   Vitals:   07/17/23 0036 07/17/23 0400 07/17/23 0446 07/17/23 0800  BP: 126/86 138/72 138/72 (!) 123/99  Pulse: 77 77 78 88  Resp: (!) 22 (!) 29 20 (!) 24  Temp: 98.1 F (36.7 C) (!) 97.5 F (36.4 C) (!) 97.5 F (36.4 C)   TempSrc: Axillary Axillary    SpO2: 97% 97% 97% 93%  Weight:      Height:        Wt Readings from Last 3 Encounters:  07/12/23 54 kg  07/08/23 54 kg  07/31/22 56 kg     Intake/Output Summary (Last 24 hours) at 07/17/2023 1015 Last data filed at 07/17/2023 0911 Gross per 24 hour  Intake 240 ml  Output --  Net 240 ml      Physical Exam   Awake Alert, Oriented X 3, hearing, but appropriate and coherent, extremely frail, deconditioned Symmetrical Chest wall movement, significantly diffuse wheezing and crackles RRR,No Gallops,Rubs or new Murmurs, No Parasternal Heave +ve B.Sounds, Abd Soft, No tenderness, No rebound - guarding or rigidity. No Cyanosis, Clubbing or edema, No new Rash or bruise       Data Review:    Recent Labs  Lab 07/13/23 0612 07/14/23 0746 07/15/23 0434 07/16/23 0427 07/17/23 0436  WBC 5.1 3.7* 4.0 4.2 5.0  HGB 10.8* 10.7* 10.2* 10.3* 10.5*  HCT 34.1* 34.3* 31.9* 32.0* 31.8*  PLT 94* 103* 104* 104* 111*  MCV 95.3 95.3 95.8  94.7 92.7  MCH 30.2 29.7 30.6 30.5 30.6  MCHC 31.7 31.2 32.0 32.2 33.0  RDW 16.0* 15.9* 15.6* 15.3 15.3  LYMPHSABS  --  0.8 0.9 0.8 1.0  MONOABS  --  0.2 0.4 0.3 0.5  EOSABS  --  0.0 0.0 0.0 0.0  BASOSABS  --  0.0 0.0 0.0 0.0    Recent Labs  Lab 07/12/23 1220 07/12/23 1511 07/12/23 1631 07/13/23 0612 07/14/23 0746 07/15/23 0434 07/15/23 0856 07/16/23 0427 07/17/23 0436  NA 139   < >  --  143 140 139  --  141 139  K 3.9   < >  --  4.1 4.3 4.2  --  4.3 4.1  CL 99  --   --  99 99 100  --  99 98  CO2 27  --   --  31 29 28   --  31 32  ANIONGAP 13  --   --  13 12 11   --  11 9  GLUCOSE 278*  --   --  117* 99 114*  --  111* 101*  BUN 29*  --   --  26* 31* 37*  --  34* 38*  CREATININE 1.26*  --   --  1.26* 1.31* 1.43*  --  1.22* 1.26*  AST 62*  --   --  36  --   --   --   --   --   ALT 29  --   --  24  --   --   --   --   --   ALKPHOS 90  --   --  81  --   --   --   --   --   BILITOT 1.1  --   --  0.8  --   --   --   --   --   ALBUMIN 3.2*  --   --  3.0*  --   --   --   --   --   CRP  --   --   --  1.5*  --   --  0.7  --   --   PROCALCITON  --   --  <0.10 <0.10  --   --  <0.10  --   --   TSH  --   --   --  0.509  --   --   --   --   --   BNP 613.3*  --   --  498.4* 476.4* 793.6*  --  621.5* 540.4*  MG  --   --   --  2.1 2.3 2.2  --  2.2 2.2  PHOS  --   --   --   --  2.9  --   --   --   --   CALCIUM 8.8*  --   --  8.8* 8.9 8.8*  --  8.7* 9.0   < > = values in this interval not displayed.      Recent Labs  Lab 07/12/23 1631 07/13/23 0612 07/14/23 0746 07/15/23 0434 07/15/23 0856 07/16/23 0427 07/17/23 0436  CRP  --  1.5*  --   --  0.7  --   --   PROCALCITON <0.10 <0.10  --   --  <0.10  --   --   TSH  --  0.509  --   --   --   --   --   BNP  --  498.4* 476.4* 793.6*  --  621.5* 540.4*  MG  --  2.1 2.3 2.2  --  2.2 2.2  CALCIUM  --  8.8* 8.9 8.8*  --  8.7* 9.0     --------------------------------------------------------------------------------------------------------------- Lab Results  Component Value Date   CHOL 146 12/15/2012   HDL 64.10 12/15/2012   LDLCALC 71 12/15/2012   TRIG 54.0 12/15/2012   CHOLHDL 2 12/15/2012      Micro Results Recent Results (from the past 240 hours)  Resp panel by RT-PCR (RSV, Flu A&B, Covid) Anterior Nasal Swab     Status: None   Collection Time: 07/08/23  8:37 PM   Specimen: Anterior Nasal Swab  Result Value Ref Range Status   SARS Coronavirus 2 by RT PCR NEGATIVE NEGATIVE Final   Influenza A by PCR NEGATIVE NEGATIVE Final   Influenza B by PCR NEGATIVE NEGATIVE Final    Comment: (NOTE) The Xpert Xpress SARS-CoV-2/FLU/RSV plus assay is intended as an aid in the diagnosis of influenza from Nasopharyngeal swab specimens and should not be used as a sole basis for treatment. Nasal washings and aspirates are unacceptable for Xpert Xpress SARS-CoV-2/FLU/RSV testing.  Fact Sheet for Patients: BloggerCourse.com  Fact Sheet for Healthcare Providers: SeriousBroker.it  This test is not  yet approved or cleared by the [place]  FDA and has been authorized for detection and/or diagnosis of SARS-CoV-2 by FDA under an Emergency Use Authorization (EUA). This EUA will remain in effect (meaning this test can be used) for the duration of the COVID-19 declaration under Section 564(b)(1) of the Act, 21 U.S.C. section 360bbb-3(b)(1), unless the authorization is terminated or revoked.     Resp Syncytial Virus by PCR NEGATIVE NEGATIVE Final    Comment: (NOTE) Fact Sheet for Patients: BloggerCourse.com  Fact Sheet for Healthcare Providers: SeriousBroker.it  This test is not yet approved or cleared by the [place]  FDA and has been authorized for detection and/or diagnosis of SARS-CoV-2 by FDA under an  Emergency Use Authorization (EUA). This EUA will remain in effect (meaning this test can be used) for the duration of the COVID-19 declaration under Section 564(b)(1) of the Act, 21 U.S.C. section 360bbb-3(b)(1), unless the authorization is terminated or revoked.  Performed at [facility], [address], [place], [place] [address]   Respiratory (~20 pathogens) panel by PCR     Status: Abnormal   Collection Time: 07/12/23  4:09 PM   Specimen: Nasopharyngeal Swab; Respiratory  Result Value Ref Range Status   Adenovirus NOT DETECTED NOT DETECTED Final   Coronavirus 229E NOT DETECTED NOT DETECTED Final    Comment: (NOTE) The Coronavirus on the Respiratory Panel, DOES NOT test for the novel  Coronavirus (2019 nCoV)    Coronavirus HKU1 NOT DETECTED NOT DETECTED Final   Coronavirus NL63 NOT DETECTED NOT DETECTED Final   Coronavirus OC43 NOT DETECTED NOT DETECTED Final   Metapneumovirus DETECTED (A) NOT DETECTED Final   Rhinovirus / Enterovirus NOT DETECTED NOT DETECTED Final   Influenza A NOT DETECTED NOT DETECTED Final   Influenza B NOT DETECTED NOT DETECTED Final   Parainfluenza Virus 1 NOT DETECTED NOT DETECTED Final   Parainfluenza Virus 2 NOT DETECTED NOT DETECTED Final   Parainfluenza Virus 3 NOT DETECTED NOT DETECTED Final   Parainfluenza Virus 4 NOT DETECTED NOT DETECTED Final   Respiratory Syncytial Virus NOT DETECTED NOT DETECTED Final   Bordetella pertussis NOT DETECTED NOT DETECTED Final   Bordetella Parapertussis NOT DETECTED NOT DETECTED Final   Chlamydophila pneumoniae NOT DETECTED NOT DETECTED Final   Mycoplasma pneumoniae NOT DETECTED NOT DETECTED Final    Comment: Performed at [facility], [address] [place], [place] [address]  Blood culture (routine x 2)     Status: None   Collection Time: 07/12/23  4:51 PM   Specimen: BLOOD RIGHT FOREARM  Result Value Ref Range Status   Specimen Description BLOOD RIGHT FOREARM  Final   Special Requests   Final     BOTTLES DRAWN AEROBIC AND ANAEROBIC Blood Culture results may not be optimal due to an inadequate volume of blood received in culture bottles   Culture   Final    NO GROWTH 5 DAYS Performed at [facility], [address], [place], [place] [address]    Report Status 07/17/2023 FINAL  Final  Blood culture (routine x 2)     Status: None   Collection Time: 07/12/23  8:56 PM   Specimen: BLOOD LEFT HAND  Result Value Ref Range Status   Specimen Description BLOOD LEFT HAND  Final   Special Requests   Final    BOTTLES DRAWN AEROBIC ONLY Blood Culture results may not be optimal due to an inadequate volume of blood received in culture bottles   Culture  Final    NO GROWTH 5 DAYS Performed at [facility], [address], [place], [place] [address]    Report Status 07/17/2023 FINAL  Final    Radiology Report DG Chest Port 1 View Result Date: 07/16/2023 CLINICAL DATA:  [id number] with shortness of breath. EXAM: PORTABLE CHEST 1 VIEW COMPARISON:  Portable chest yesterday at 6:00 a.m. FINDINGS: 6:40 a.m. Left retrocardiac consolidation centered laterally continues to be seen. Lungs are otherwise clear. The heart is moderately enlarged. No vascular congestion is seen. The mediastinum is stable with aortic tortuosity calcification. There is no substantial pleural effusion. There is osteopenia with dextroscoliosis and degenerative change of the spine, chronic rotator cuff arthropathy. IMPRESSION: 1. Left retrocardiac consolidation centered laterally continues to be seen. Overall aeration seems unchanged. 2. Cardiomegaly without evidence of vascular congestion. 3. Aortic atherosclerosis. Electronically Signed   By: Denman Fischer M.D.   On: 07/16/2023 06:58     Signature  -   Seena Dadds M.D on 07/17/2023 at 10:15 AM   -  To page go to [email or web address]

## 2023-07-18 ENCOUNTER — Inpatient Hospital Stay (HOSPITAL_COMMUNITY)

## 2023-07-18 DIAGNOSIS — J9601 Acute respiratory failure with hypoxia: Secondary | ICD-10-CM | POA: Diagnosis not present

## 2023-07-18 DIAGNOSIS — Z789 Other specified health status: Secondary | ICD-10-CM | POA: Diagnosis not present

## 2023-07-18 DIAGNOSIS — Z515 Encounter for palliative care: Secondary | ICD-10-CM | POA: Diagnosis not present

## 2023-07-18 DIAGNOSIS — E43 Unspecified severe protein-calorie malnutrition: Secondary | ICD-10-CM

## 2023-07-18 DIAGNOSIS — Z7189 Other specified counseling: Secondary | ICD-10-CM | POA: Diagnosis not present

## 2023-07-18 LAB — CBC WITH DIFFERENTIAL/PLATELET
Abs Immature Granulocytes: 0.09 10*3/uL — ABNORMAL HIGH (ref 0.00–0.07)
Basophils Absolute: 0 10*3/uL (ref 0.0–0.1)
Basophils Relative: 0 %
Eosinophils Absolute: 0 10*3/uL (ref 0.0–0.5)
Eosinophils Relative: 0 %
HCT: 32.8 % — ABNORMAL LOW (ref 36.0–46.0)
Hemoglobin: 10.8 g/dL — ABNORMAL LOW (ref 12.0–15.0)
Immature Granulocytes: 2 %
Lymphocytes Relative: 26 %
Lymphs Abs: 1.2 10*3/uL (ref 0.7–4.0)
MCH: 30.9 pg (ref 26.0–34.0)
MCHC: 32.9 g/dL (ref 30.0–36.0)
MCV: 94 fL (ref 80.0–100.0)
Monocytes Absolute: 0.4 10*3/uL (ref 0.1–1.0)
Monocytes Relative: 8 %
Neutro Abs: 3.1 10*3/uL (ref 1.7–7.7)
Neutrophils Relative %: 64 %
Platelets: 109 10*3/uL — ABNORMAL LOW (ref 150–400)
RBC: 3.49 MIL/uL — ABNORMAL LOW (ref 3.87–5.11)
RDW: 15.4 % (ref 11.5–15.5)
WBC: 4.9 10*3/uL (ref 4.0–10.5)
nRBC: 0 % (ref 0.0–0.2)

## 2023-07-18 LAB — GLUCOSE, CAPILLARY
Glucose-Capillary: 107 mg/dL — ABNORMAL HIGH (ref 70–99)
Glucose-Capillary: 157 mg/dL — ABNORMAL HIGH (ref 70–99)
Glucose-Capillary: 130 mg/dL — ABNORMAL HIGH (ref 70–99)

## 2023-07-18 LAB — BASIC METABOLIC PANEL WITH GFR
Anion gap: 13 (ref 5–15)
BUN: 40 mg/dL — ABNORMAL HIGH (ref 8–23)
CO2: 34 mmol/L — ABNORMAL HIGH (ref 22–32)
Calcium: 9 mg/dL (ref 8.9–10.3)
Chloride: 94 mmol/L — ABNORMAL LOW (ref 98–111)
Creatinine, Ser: 1.35 mg/dL — ABNORMAL HIGH (ref 0.44–1.00)
GFR, Estimated: 35 mL/min — ABNORMAL LOW (ref >60)
Glucose, Bld: 101 mg/dL — ABNORMAL HIGH (ref 70–99)
Potassium: 4.4 mmol/L (ref 3.5–5.1)
Sodium: 141 mmol/L (ref 135–145)

## 2023-07-18 LAB — BRAIN NATRIURETIC PEPTIDE: B Natriuretic Peptide: 391 pg/mL — ABNORMAL HIGH (ref 0.0–100.0)

## 2023-07-18 LAB — PHOSPHORUS: Phosphorus: 3.4 mg/dL (ref 2.5–4.6)

## 2023-07-18 LAB — PROCALCITONIN: Procalcitonin: <0.1 ng/mL

## 2023-07-18 LAB — MAGNESIUM: Magnesium: 2.3 mg/dL (ref 1.7–2.4)

## 2023-07-18 MED ORDER — ACETAZOLAMIDE 250 MG PO TABS
250.0000 mg | ORAL_TABLET | Freq: Three times a day (TID) | ORAL | Status: AC
  Administered 2023-07-18 (×2): 250 mg via ORAL
  Filled 2023-07-18 (×2): qty 1

## 2023-07-18 MED ORDER — FUROSEMIDE 40 MG PO TABS
40.0000 mg | ORAL_TABLET | Freq: Two times a day (BID) | ORAL | Status: DC
  Administered 2023-07-18: 40 mg via ORAL
  Filled 2023-07-18: qty 1

## 2023-07-18 MED ORDER — FUROSEMIDE 10 MG/ML IJ SOLN
40.0000 mg | Freq: Once | INTRAMUSCULAR | Status: AC
  Administered 2023-07-18: 40 mg via INTRAVENOUS
  Filled 2023-07-18: qty 4

## 2023-07-18 MED ORDER — ACETAZOLAMIDE 250 MG PO TABS: 250.0000 mg | ORAL_TABLET | Freq: Three times a day (TID) | ORAL | Status: DC

## 2023-07-18 NOTE — Progress Notes (Addendum)
 PROGRESS NOTE                                                                                                                                                                                                             Patient Demographics:    Julie Lara, is a 89 y.o. female, DOB - 12-Aug-1920, GEX:[id number]  Outpatient Primary MD for the patient is Geoffry Paradise, MD    LOS - 5  Admit date - 07/12/2023    Chief Complaint  Patient presents with   Shortness of Breath   Weakness       Brief Narrative   89 y.o. female with medical history significant of hypertension, hyperlipidemia, hypothyroidism, CKD 3, chronic systolic CHF, CAD, atrial fibrillation, anemia presenting with worsening shortness of breath.  Workup suggestive of acute hypoxic respiratory failure due to metapneumovirus pNA.   Subjective:   Discussed with family at bedside, she had a better night sleep, she was able to ambulate from bed to chair couple times yesterday.   Assessment  & Plan :    Acute respiratory failure with hypoxia due to metapneumovirus pneumonia. - Work of breathing requiring BiPAP initially, continue with as needed. - Continue to wean oxygen as tolerated, this morning she is down to 3 L oxygen. - Treated with azithromycin due to concern of bacterial coinfection. -She was encouraged to use incentive spirometry, flutter valve, continue with hypertonic saline  - Still significant Rales bilaterally, continue with IV diuresis, give extra dose of 40 mg IV Lasix today, and resume on home Lasix 40 mg p.o. twice daily, will keep monitoring closely given some evidence of contraction alkalosis and elevated creatinine, will give Diamox x 2 doses today. - Continue with IV steroids.  Start on PPI for GI prophylaxis -Discussed with family, she is high risk for hospital delirium, they will allow for circadian rhythm, to keep her engaged and  interactive daytime, will do melatonin scheduled at bedtime and as needed hydroxyzine - of note patient is on scheduled Lasix and prednisone at home. - even though her oxygen requirements are less, but she does appear to be significantly dyspneic today with minimal activity even with speaking, so I will check x-ray. - Have discussed with daughter and granddaughters at bedside, her respiratory status is very tenuous, and she is quite symptomatic/dyspneic with minimal activity, and may  need some symptomatic management, so hospice would be very helpful at home, palliative medicine assistance greatly appreciated, hospice has been consulted.  SpO2: 92 % O2 Flow Rate (L/min): 3 L/min FiO2 (%): 50 %   Prolonged QTc > QTc 500 on EKG.   - Avoid QTc prolonging medications -Keep electrolytes stable, renew low-dose beta-blocker, repeat EKG 07/14/2023 with improvement in QTc   Hypertension - Continue beta-blocker as tolerated by blood pressure   Hyperlipidemia - Continue home atorvastatin   Hypothyroidism - Continue home Synthroid stable TSH.   CKD 3 B > Creatinine slight around 1.4 - Trend renal function and electrolytes   Acute on chronic systolic CHF EF 45% to 50%. > Has some acute decompensation on 07/14/2023, IV Lasix, low-dose beta-blocker, dose Lasix daily, get 40 mg of IV Lasix twice daily with low-dose Aldactone continue low-dose beta-blocker.  Renal insufficiency precludes the use of ACE/ARB/Entresto.   CAD > Status post PCI per chart - Continue home ASA, metoprolol, atorvastatin   Paroxysmal atrial fibrillation with RVR > Currently sinus rhythm. - Continue home metoprolol, as needed IV Cardizem - Not on any anticoagulation   Thrombocytopenia > Stable at 92 - Trend CBC      Hypoglycemia > Noted to have some hypoglycemia by EMS.  Responded to D10 now hyperglycemic. - Trend CBGs   CBG (last 3)  Recent Labs    07/17/23 0038 07/17/23 2332 07/18/23 0833  GLUCAP 130* 129*  107*         Condition - Extremely Guarded  Family Communication  : Discussed with both granddaughters at bedside  Code Status :  DNR  Consults  : Palliative care  PUD Prophylaxis : PPI   Procedures  :     CT -  Marked cardiomegaly. Coronary artery disease. 4.7 cm ascending thoracic aortic aneurysm. Recommend semi-annual imaging followup by CTA or MRA and referral to cardiothoracic surgery if not already obtained.  TTE - 1. Left ventricular ejection fraction, by estimation, is 45 to 50%. The left ventricle has mildly decreased function. The left ventricle has no regional wall motion abnormalities. There is mild concentric left ventricular hypertrophy. Left ventricular diastolic parameters are indeterminate.  2. Right ventricular systolic function is mildly reduced. The right ventricular size is severely enlarged. There is moderately elevated pulmonary artery systolic pressure. The estimated right ventricular systolic pressure is 46.4 mmHg.  3. Left atrial size was severely dilated.  4. Right atrial size was severely dilated.  5. The mitral valve is normal in structure. Mild mitral valve regurgitation. No evidence of mitral stenosis.  6. Severe, secondary regurgitation with coaptation gap of 0.7 cm. The tricuspid valve is abnormal. Tricuspid valve regurgitation is severe.  7. Severe, central, aortic regurgitation with 2D vena contracta of 0.8 cm and holodiastolic reversal in the descending aortic arch. The aortic valve is tricuspid. Aortic valve regurgitation is severe. Aortic valve sclerosis/calcification is present, without any evidence of aortic stenosis.  8. Pulmonic valve regurgitation is moderate.  9. Aortic dilatation noted. There is mild dilatation of the aortic root, measuring 41 mm. There is mild dilatation of the ascending aorta, measuring 45 mm. 10. The inferior vena cava is dilated in size with <50% respiratory variability, suggesting right atrial pressure of 15 mmHg.       Disposition Plan  :    Status is: Inpt  DVT Prophylaxis  :    heparin injection 5,000 Units Start: 07/12/23 1615    Lab Results  Component Value Date   PLT 109 (  L) 07/18/2023    Diet :  Diet Order             DIET DYS 3 Room service appropriate? Yes; Fluid consistency: Nectar Thick  Diet effective now                    Inpatient Medications  Scheduled Meds:  aspirin  81 mg Oral QPM   atorvastatin  40 mg Oral QPM   B-complex with vitamin C  1 tablet Oral Daily   vitamin B-12  1,000 mcg Oral Q lunch   furosemide  40 mg Intravenous Once   furosemide  40 mg Oral BID   heparin  5,000 Units Subcutaneous Q8H   ipratropium-albuterol  3 mL Nebulization TID   lactose free nutrition  237 mL Oral BID BM   levothyroxine  25 mcg Oral Q0600   melatonin  3 mg Oral QHS   methylPREDNISolone (SOLU-MEDROL) injection  60 mg Intravenous Q24H   metoprolol tartrate  50 mg Oral BID   multivitamin with minerals  1 tablet Oral Q lunch   pantoprazole (PROTONIX) IV  40 mg Intravenous Q24H   sodium chloride flush  3 mL Intravenous Q12H   sodium chloride HYPERTONIC  4 mL Nebulization BID   Continuous Infusions: PRN Meds:.acetaminophen **OR** acetaminophen, diltiazem, guaiFENesin-dextromethorphan, hydrOXYzine, ipratropium-albuterol, nitroGLYCERIN, polyethylene glycol, sodium chloride     Objective:   Vitals:   07/18/23 0548 07/18/23 0726 07/18/23 0800 07/18/23 0835  BP: (!) 108/58  106/70   Pulse: 89  99   Resp: (!) 21  (!) 31   Temp: 97.8 F (36.6 C)   97.6 F (36.4 C)  TempSrc: Axillary   Axillary  SpO2: (!) 83% 97% 92%   Weight:      Height:        Wt Readings from Last 3 Encounters:  07/12/23 54 kg  07/08/23 54 kg  07/31/22 56 kg     Intake/Output Summary (Last 24 hours) at 07/18/2023 1116 Last data filed at 07/17/2023 1345 Gross per 24 hour  Intake 120 ml  Output --  Net 120 ml      Physical Exam   Awake Alert, Oriented X 3, hearing, but appropriate  and coherent, extremely frail, deconditioned Symmetrical Chest wall movement, significantly diffuse wheezing and crackles RRR,No Gallops,Rubs or new Murmurs, No Parasternal Heave +ve B.Sounds, Abd Soft, No tenderness, No rebound - guarding or rigidity. No Cyanosis, Clubbing or edema, No new Rash or bruise        Data Review:    Recent Labs  Lab 07/14/23 0746 07/15/23 0434 07/16/23 0427 07/17/23 0436 07/18/23 0803  WBC 3.7* 4.0 4.2 5.0 4.9  HGB 10.7* 10.2* 10.3* 10.5* 10.8*  HCT 34.3* 31.9* 32.0* 31.8* 32.8*  PLT 103* 104* 104* 111* 109*  MCV 95.3 95.8 94.7 92.7 94.0  MCH 29.7 30.6 30.5 30.6 30.9  MCHC 31.2 32.0 32.2 33.0 32.9  RDW 15.9* 15.6* 15.3 15.3 15.4  LYMPHSABS 0.8 0.9 0.8 1.0 1.2  MONOABS 0.2 0.4 0.3 0.5 0.4  EOSABS 0.0 0.0 0.0 0.0 0.0  BASOSABS 0.0 0.0 0.0 0.0 0.0    Recent Labs  Lab 07/12/23 1220 07/12/23 1220 07/12/23 1511 07/12/23 1631 07/13/23 0612 07/14/23 0746 07/15/23 0434 07/15/23 0856 07/16/23 0427 07/17/23 0436 07/17/23 1259 07/18/23 0803  NA 139  --    < >  --  143 140 139  --  141 139  --  141  K 3.9  --    < >  --  4.1 4.3 4.2  --  4.3 4.1  --  4.4  CL 99  --   --   --  99 99 100  --  99 98  --  94*  CO2 27  --   --   --  31 29 28   --  31 32  --  34*  ANIONGAP 13  --   --   --  13 12 11   --  11 9  --  13  GLUCOSE 278*  --   --   --  117* 99 114*  --  111* 101*  --  101*  BUN 29*  --   --   --  26* 31* 37*  --  34* 38*  --  40*  CREATININE 1.26*  --   --   --  1.26* 1.31* 1.43*  --  1.22* 1.26*  --  1.35*  AST 62*  --   --   --  36  --   --   --   --   --   --   --   ALT 29  --   --   --  24  --   --   --   --   --   --   --   ALKPHOS 90  --   --   --  81  --   --   --   --   --   --   --   BILITOT 1.1  --   --   --  0.8  --   --   --   --   --   --   --   ALBUMIN 3.2*  --   --   --  3.0*  --   --   --   --   --   --   --   CRP  --   --   --   --  1.5*  --   --  0.7  --   --   --   --   PROCALCITON  --   --   --  <0.10 <0.10  --   --   <0.10  --   --   --  <0.10  TSH  --   --   --   --  0.509  --   --   --   --   --   --   --   BNP 613.3*  --   --   --  498.4* 476.4* 793.6*  --  621.5* 540.4*  --  391.0*  MG  --    < >  --   --  2.1 2.3 2.2  --  2.2 2.2  --  2.3  PHOS  --   --   --   --   --  2.9  --   --   --   --  3.3 3.4  CALCIUM 8.8*  --   --   --  8.8* 8.9 8.8*  --  8.7* 9.0  --  9.0   < > = values in this interval not displayed.      Recent Labs  Lab 07/12/23 1220 07/12/23 1631 07/13/23 0612 07/14/23 0746 07/15/23 0434 07/15/23 0856 07/16/23 0427 07/17/23 0436 07/18/23 0803  CRP  --   --  1.5*  --   --  0.7  --   --   --   PROCALCITON  --  <  0.10 <0.10  --   --  <0.10  --   --  <0.10  TSH  --   --  0.509  --   --   --   --   --   --   BNP  --   --  498.4* 476.4* 793.6*  --  621.5* 540.4* 391.0*  MG   < >  --  2.1 2.3 2.2  --  2.2 2.2 2.3  CALCIUM  --   --  8.8* 8.9 8.8*  --  8.7* 9.0 9.0   < > = values in this interval not displayed.    --------------------------------------------------------------------------------------------------------------- Lab Results  Component Value Date   CHOL 146 12/15/2012   HDL 64.10 12/15/2012   LDLCALC 71 12/15/2012   TRIG 54.0 12/15/2012   CHOLHDL 2 12/15/2012      Micro Results Recent Results (from the past 240 hours)  Resp panel by RT-PCR (RSV, Flu A&B, Covid) Anterior Nasal Swab     Status: None   Collection Time: 07/08/23  8:37 PM   Specimen: Anterior Nasal Swab  Result Value Ref Range Status   SARS Coronavirus 2 by RT PCR NEGATIVE NEGATIVE Final   Influenza A by PCR NEGATIVE NEGATIVE Final   Influenza B by PCR NEGATIVE NEGATIVE Final    Comment: (NOTE) The Xpert Xpress SARS-CoV-2/FLU/RSV plus assay is intended as an aid in the diagnosis of influenza from Nasopharyngeal swab specimens and should not be used as a sole basis for treatment. Nasal washings and aspirates are unacceptable for Xpert Xpress SARS-CoV-2/FLU/RSV testing.  Fact Sheet for  Patients: BloggerCourse.com  Fact Sheet for Healthcare Providers: SeriousBroker.it  This test is not yet approved or cleared by the [place] FDA and has been authorized for detection and/or diagnosis of SARS-CoV-2 by FDA under an Emergency Use Authorization (EUA). This EUA will remain in effect (meaning this test can be used) for the duration of the COVID-19 declaration under Section 564(b)(1) of the Act, 21 U.S.C. section 360bbb-3(b)(1), unless the authorization is terminated or revoked.     Resp Syncytial Virus by PCR NEGATIVE NEGATIVE Final    Comment: (NOTE) Fact Sheet for Patients: BloggerCourse.com  Fact Sheet for Healthcare Providers: SeriousBroker.it  This test is not yet approved or cleared by the [place] FDA and has been authorized for detection and/or diagnosis of SARS-CoV-2 by FDA under an Emergency Use Authorization (EUA). This EUA will remain in effect (meaning this test can be used) for the duration of the COVID-19 declaration under Section 564(b)(1) of the Act, 21 U.S.C. section 360bbb-3(b)(1), unless the authorization is terminated or revoked.  Performed at [facility], [address], [place], [place] [address]   Respiratory (~20 pathogens) panel by PCR     Status: Abnormal   Collection Time: 07/12/23  4:09 PM   Specimen: Nasopharyngeal Swab; Respiratory  Result Value Ref Range Status   Adenovirus NOT DETECTED NOT DETECTED Final   Coronavirus 229E NOT DETECTED NOT DETECTED Final    Comment: (NOTE) The Coronavirus on the Respiratory Panel, DOES NOT test for the novel  Coronavirus (2019 nCoV)    Coronavirus HKU1 NOT DETECTED NOT DETECTED Final   Coronavirus NL63 NOT DETECTED NOT DETECTED Final   Coronavirus OC43 NOT DETECTED NOT DETECTED Final   Metapneumovirus DETECTED (A) NOT DETECTED Final   Rhinovirus / Enterovirus NOT DETECTED NOT  DETECTED Final   Influenza A NOT DETECTED NOT DETECTED Final   Influenza B NOT DETECTED NOT DETECTED Final  Parainfluenza Virus 1 NOT DETECTED NOT DETECTED Final   Parainfluenza Virus 2 NOT DETECTED NOT DETECTED Final   Parainfluenza Virus 3 NOT DETECTED NOT DETECTED Final   Parainfluenza Virus 4 NOT DETECTED NOT DETECTED Final   Respiratory Syncytial Virus NOT DETECTED NOT DETECTED Final   Bordetella pertussis NOT DETECTED NOT DETECTED Final   Bordetella Parapertussis NOT DETECTED NOT DETECTED Final   Chlamydophila pneumoniae NOT DETECTED NOT DETECTED Final   Mycoplasma pneumoniae NOT DETECTED NOT DETECTED Final    Comment: Performed at [facility], [address] [place], [place] [address]  Blood culture (routine x 2)     Status: None   Collection Time: 07/12/23  4:51 PM   Specimen: BLOOD RIGHT FOREARM  Result Value Ref Range Status   Specimen Description BLOOD RIGHT FOREARM  Final   Special Requests   Final    BOTTLES DRAWN AEROBIC AND ANAEROBIC Blood Culture results may not be optimal due to an inadequate volume of blood received in culture bottles   Culture   Final    NO GROWTH 5 DAYS Performed at [facility], [address], [place], [place] [address]    Report Status 07/17/2023 FINAL  Final  Blood culture (routine x 2)     Status: None   Collection Time: 07/12/23  8:56 PM   Specimen: BLOOD LEFT HAND  Result Value Ref Range Status   Specimen Description BLOOD LEFT HAND  Final   Special Requests   Final    BOTTLES DRAWN AEROBIC ONLY Blood Culture results may not be optimal due to an inadequate volume of blood received in culture bottles   Culture   Final    NO GROWTH 5 DAYS Performed at [facility], [address], [place], [place] [address]    Report Status 07/17/2023 FINAL  Final    Radiology Report No results found.    Signature  -   Seena Dadds M.D on 07/18/2023 at 11:16 AM   -  To page go to [email or web address]

## 2023-07-18 NOTE — Progress Notes (Signed)
   07/18/23 1523  Mobility  Activity Transferred from chair to bed  Level of Assistance Minimal assist, patient does 75% or more  Assistive Device Front wheel walker  Activity Response Tolerated fair  Mobility Referral Yes  Mobility visit 1 Mobility  Mobility Specialist Start Time (ACUTE ONLY) 1352  Mobility Specialist Stop Time (ACUTE ONLY) 1418  Mobility Specialist Time Calculation (min) (ACUTE ONLY) 26 min   Mobility Specialist: Progress Note  Pre-Mobility:      HR 110s, SpO2 97% 3L Post-Mobility:    HR 120s, SpO2 97% 3L  Pt agreeable to mobility session - received in bed. Pt with cough throughout. Returned to chair with all needs met - call bell within reach. Family present.   Pre-Mobility:      HR 80s, SpO2 97% 3L Post-Mobility:    HR 80s, SpO2 99% 3L  Pt agreeable to mobility session - received in chair. Pt with cough throughout. Returned to bed with all needs met - call bell within reach. Family present.   Julie Lara, BS Mobility Specialist Please contact via SecureChat or  Rehab office at [phone].

## 2023-07-18 NOTE — Progress Notes (Signed)
 Daily Progress Note   Patient Name: Julie Lara       Date: 07/18/2023 DOB: [birth year]  Age: 89 y.o. MRN#: [id number] Attending Physician: Epifanio Haste, MD Primary Care Physician: Suan Elm, MD Admit Date: 07/12/2023 Length of Stay: 5 days  Reason for Consultation/Follow-up: Establishing goals of care  HPI/Patient Profile:  89 y.o. female with past medical history of hypertension, hyperlipidemia, hypothyroidism, CKD 3, chronic systolic CHF, CAD, atrial fibrillation, anemia presented to the ED on 07/12/2023 with complaints of shortness of breath. She was recently seen in ED on 07/03/23 for same - work up was unremarkable and she was sent home.   Palliative medicine was consulted for GOC conversations.  Subjective:   Subjective: Chart Reviewed. Updates received. Patient Assessed. Created space and opportunity for patient  and family to explore thoughts and feelings regarding current medical situation.  Today's Discussion: Today before seeing the patient I reviewed the chart for updates since our last palliative visit.  It appears the patient continues to make slow and incremental improvements from her acute situation.  Today saw the patient at bedside, she was awake and greeted me warmly.  Multiple family members present including daughter Julie Lara and daughter Julie Lara.  The patient states she is doing "okay" today, some shortness of breath now.  She is sitting in the bedside chair.  She was able to get bathed this morning, has a good appetite and ate a good amount of breakfast.  I encouraged her to continue to eat to try to get strength and get better.  She continues with a persistent cough, which seems to be frustrating her.  She continues to use a suction to help.  I spent time talking with the patient's family about multiple options.  They have also speaking with Dr. Osborne Blazer with the hospitalist group.  He offered that she could be discharged today, or could stay another day or  2 depending on how the family would like to proceed.  We discussed multiple options and ways forward.  At the end of our conversation they have agreed to remain here for another day, engage with hospice services with Julie Lara to provide ongoing support at home in an effort to try to prevent readmission and as her overall health declines.  They again requested not call services "hospice" but rather "home care" to help the patient not become upset.  I shared that I would reach out to the medical team, TOC/social worker, and hospice liaison to begin arranging services.  We would continue to treat the treatable here and plan for discharge home with hospice services.  Family is in agreement.  I provided emotional and general support through therapeutic listening, empathy, sharing of stories, and other techniques. I answered all questions and addressed all concerns to the best of my ability.  Review of Systems  Constitutional:  Positive for fatigue.       Denies pain in general  Respiratory:  Positive for cough and shortness of breath (Mild).   Cardiovascular:  Negative for chest pain.  Gastrointestinal:  Negative for abdominal pain, nausea and vomiting.    Objective:   Vital Signs:  BP 106/70   Pulse 99   Temp 97.6 F (36.4 C) (Axillary)   Resp (!) 31   Ht 5\' 4"  (1.626 m)   Wt 54 kg   SpO2 92%   BMI 20.43 kg/m   Physical Exam Vitals and nursing note reviewed.  Constitutional:      General: She is not  in acute distress.    Appearance: She is ill-appearing. She is not toxic-appearing.  HENT:     Head: Normocephalic and atraumatic.  Cardiovascular:     Rate and Rhythm: Normal rate.  Pulmonary:     Effort: Pulmonary effort is normal. No respiratory distress.     Comments: Noted productive cough during my visit, although less intense and less frequent than yesterday Abdominal:     General: Abdomen is flat.  Skin:    General: Skin is warm and dry.  Neurological:      General: No focal deficit present.     Mental Status: She is alert.  Psychiatric:        Mood and Affect: Mood normal.        Behavior: Behavior normal.     Palliative Assessment/Data: 60-70%    Existing Vynca/ACP Documentation: DNR effective/15/2025  Assessment & Plan:   Impression: Present on Admission:  Essential hypertension  HLD (hyperlipidemia)  Hypothyroidism  CKD (chronic kidney disease), stage III (HCC)  Chronic systolic congestive heart failure (HCC)  CAD (coronary artery disease)  Atrial fibrillation (HCC)  Anemia  Acute respiratory failure with hypoxia (HCC)  SUMMARY OF RECOMMENDATIONS   DNR-limited Continue to treat the treatable TOC consult for referral to Julie Lara for home hospice at discharge Anticipate discharge in 24 to 48 hours Palliative medicine will follow and support while inpatient  Symptom Management:  Per primary team BMT available assist as needed  Code Status: DNR-limited  Prognosis: < 6 months  Discharge Planning: Home with Hospice  Discussed with: Patient, family, medical team, nursing team  Thank you for allowing us  to participate in the care of Julie Lara PMT will continue to support holistically.  Time Total: 60 min  Detailed review of medical records (labs, imaging, vital signs), medically appropriate exam, discussed with treatment team, counseling and education to patient, family, & staff, documenting clinical information, medication management, coordination of care  Lizbeth Right, NP Palliative Medicine Team  Team Phone # [phone] (Nights/Weekends)  11/29/2020, 8:17 AM

## 2023-07-18 NOTE — Plan of Care (Signed)

## 2023-07-18 NOTE — Progress Notes (Signed)
 [facility] Liaison Note  Received request for hospice services at home after discharge. Spoke with Erby Hatcher, daughter as well as other family members to initiate education related to hospice philosophy, services, and team approach to care. Patient/family verbalized understanding of information given. Per discussion, the plan is for discharge home by PTAR/EMS likely tomorrow.   DME needs discussed. Patient has a wheelchair and standard walker in the home. Patient/family requests the following equipment for delivery: nebulizer, suction with equipment for oral suctioning, Oxygen, 4LPM currently, hospital bed, overbed table, geri chair, bedside commode and wheeled walker. The address has been verified and is correct in the chart. Erby Hatcher is the family contact to arrange time of equipment delivery.  Please send signed and completed DNR home with patient/family. Please provide prescriptions at discharge as needed to ensure ongoing symptom management.   [facility] information and contact numbers given to [facility]. Above information shared with Hazen Liter, Transitions of Care Manager. Please call with any questions or concerns.   Thank you for the opportunity to participate in this patient's care.   Madelene Schanz BSN, Charity fundraiser, OCN [employer] [phone]

## 2023-07-18 NOTE — TOC Progression Note (Signed)
 Transition of Care [facility]) - Progression Note    Patient Details  Name: Julie Lara MRN: [id number] Date of Birth: [birth year]  Transition [facility]) CM/SW Contact  Eusebio High, RN Phone Number: 07/18/2023, 2:11 PM  Clinical Narrative:     Patient will dc to home today with [facility] at home. [facility] will transport tomorrow.   Per [facility] chat:   update: DME is to be delivered tomorrow between 10-12pm per family request. We have an in home hospice nurse visit held for 5pm tomorrow.   RNCM has completed the med nec form for [facility] and will arrange for transport as soon as confirmation of DME delivery is received.         Expected Discharge Plan and Services                                               Social Determinants of Health (SDOH) Interventions SDOH Screenings   Food Insecurity: No Food Insecurity (07/12/2023)  Housing: Low Risk  (07/12/2023)  Transportation Needs: No Transportation Needs (07/12/2023)  Utilities: Not At Risk (07/12/2023)  Social Connections: Socially Isolated (07/12/2023)  Tobacco Use: Low Risk  (07/12/2023)    Readmission Risk Interventions     No data to display

## 2023-07-19 DIAGNOSIS — Z7189 Other specified counseling: Secondary | ICD-10-CM | POA: Diagnosis not present

## 2023-07-19 DIAGNOSIS — Z515 Encounter for palliative care: Secondary | ICD-10-CM | POA: Diagnosis not present

## 2023-07-19 DIAGNOSIS — R410 Disorientation, unspecified: Secondary | ICD-10-CM | POA: Diagnosis not present

## 2023-07-19 DIAGNOSIS — J9601 Acute respiratory failure with hypoxia: Secondary | ICD-10-CM | POA: Diagnosis not present

## 2023-07-19 LAB — CBC
HCT: 33.2 % — ABNORMAL LOW (ref 36.0–46.0)
Hemoglobin: 10.6 g/dL — ABNORMAL LOW (ref 12.0–15.0)
MCH: 30 pg (ref 26.0–34.0)
MCHC: 31.9 g/dL (ref 30.0–36.0)
MCV: 94.1 fL (ref 80.0–100.0)
Platelets: 117 10*3/uL — ABNORMAL LOW (ref 150–400)
RBC: 3.53 MIL/uL — ABNORMAL LOW (ref 3.87–5.11)
RDW: 15.2 % (ref 11.5–15.5)
WBC: 5.5 10*3/uL (ref 4.0–10.5)
nRBC: 0 % (ref 0.0–0.2)

## 2023-07-19 LAB — BASIC METABOLIC PANEL WITH GFR
Anion gap: 10 (ref 5–15)
BUN: 45 mg/dL — ABNORMAL HIGH (ref 8–23)
CO2: 35 mmol/L — ABNORMAL HIGH (ref 22–32)
Calcium: 8.9 mg/dL (ref 8.9–10.3)
Chloride: 94 mmol/L — ABNORMAL LOW (ref 98–111)
Creatinine, Ser: 1.38 mg/dL — ABNORMAL HIGH (ref 0.44–1.00)
GFR, Estimated: 34 mL/min — ABNORMAL LOW (ref >60)
Glucose, Bld: 102 mg/dL — ABNORMAL HIGH (ref 70–99)
Potassium: 4.1 mmol/L (ref 3.5–5.1)
Sodium: 139 mmol/L (ref 135–145)

## 2023-07-19 LAB — BRAIN NATRIURETIC PEPTIDE: B Natriuretic Peptide: 461.2 pg/mL — ABNORMAL HIGH (ref 0.0–100.0)

## 2023-07-19 LAB — GLUCOSE, CAPILLARY: Glucose-Capillary: 131 mg/dL — ABNORMAL HIGH (ref 70–99)

## 2023-07-19 MED ORDER — GUAIFENESIN 100 MG/5ML PO LIQD
5.0000 mL | Freq: Two times a day (BID) | ORAL | Status: DC
  Administered 2023-07-19 – 2023-07-20 (×2): 5 mL via ORAL
  Filled 2023-07-19 (×2): qty 5

## 2023-07-19 MED ORDER — MAGNESIUM CITRATE PO SOLN
0.5000 | Freq: Once | ORAL | Status: AC
  Administered 2023-07-19: 0.5 via ORAL
  Filled 2023-07-19: qty 296

## 2023-07-19 MED ORDER — FUROSEMIDE 40 MG PO TABS
40.0000 mg | ORAL_TABLET | Freq: Every day | ORAL | Status: DC
  Administered 2023-07-19 – 2023-07-20 (×2): 40 mg via ORAL
  Filled 2023-07-19 (×2): qty 1

## 2023-07-19 MED ORDER — ACETAZOLAMIDE 250 MG PO TABS
250.0000 mg | ORAL_TABLET | Freq: Three times a day (TID) | ORAL | Status: DC
  Administered 2023-07-19 – 2023-07-20 (×4): 250 mg via ORAL
  Filled 2023-07-19 (×6): qty 1

## 2023-07-19 NOTE — Progress Notes (Signed)
 Mobility Specialist Progress Note:    07/19/23 1219  Mobility  Activity  (chair level exercises)  Level of Assistance Minimal assist, patient does 75% or more  Activity Response Tolerated well  Mobility Referral Yes  Mobility visit 1 Mobility  Mobility Specialist Start Time (ACUTE ONLY) 1052  Mobility Specialist Stop Time (ACUTE ONLY) 1101  Mobility Specialist Time Calculation (min) (ACUTE ONLY) 9 min   Pt received in chair agreeable to mobility. Pt was very lethargic this only able to do chair level exercises. Pt had 2 coughing episodes w/ SPO2 dropping to 83% on 3L/min was able to recover after some PLB. Further mobility deferred. RN aware. Call bell and personal belongings in reach. All needs met.  Thersia Minder Mobility Specialist  Please contact vis Secure Chat or  Rehab Office [phone]

## 2023-07-19 NOTE — Progress Notes (Addendum)
 Daily Progress Note   Patient Name: Julie Lara       Date: 07/19/2023 DOB: 1920-12-22  Age: 89 y.o. MRN#: [id number] Attending Physician: Epifanio Haste, MD Primary Care Physician: Suan Elm, MD Admit Date: 07/12/2023  Reason for Consultation/Follow-up: {Reason for Consult:23484}  Subjective: I have reviewed medical records including EPIC notes, MAR, any available advanced directives as necessary, and labs. Received report from primary RN - ***  All questions and concerns addressed. Encouraged to call with questions and/or concerns. PMT card provided.  Length of Stay: 6  Current Medications: Scheduled Meds:   acetaZOLAMIDE   250 mg Oral TID   aspirin   81 mg Oral QPM   atorvastatin   40 mg Oral QPM   B-complex with vitamin C  1 tablet Oral Daily   vitamin B-12  1,000 mcg Oral Q lunch   furosemide   40 mg Oral Daily   heparin   5,000 Units Subcutaneous Q8H   ipratropium-albuterol   3 mL Nebulization TID   lactose free nutrition  237 mL Oral BID BM   levothyroxine   25 mcg Oral Q0600   magnesium  citrate  0.5 Bottle Oral Once   melatonin  3 mg Oral QHS   methylPREDNISolone  (SOLU-MEDROL ) injection  60 mg Intravenous Q24H   metoprolol  tartrate  50 mg Oral BID   multivitamin with minerals  1 tablet Oral Q lunch   pantoprazole  (PROTONIX ) IV  40 mg Intravenous Q24H   sodium chloride  flush  3 mL Intravenous Q12H   sodium chloride  HYPERTONIC  4 mL Nebulization BID    Continuous Infusions:   PRN Meds: acetaminophen  **OR** acetaminophen , diltiazem , guaiFENesin -dextromethorphan , hydrOXYzine , ipratropium-albuterol , nitroGLYCERIN , polyethylene glycol, sodium chloride   Physical Exam          Vital Signs: BP 105/65 (BP Location: Left Arm)   Pulse 95   Temp 97.6 F (36.4 C)  (Axillary)   Resp 20   Ht 5\' 4"  (1.626 m)   Wt 54 kg   SpO2 95%   BMI 20.43 kg/m  SpO2: SpO2: 95 % O2 Device: O2 Device: Nasal Cannula O2 Flow Rate: O2 Flow Rate (L/min): 3 L/min  Intake/output summary:  Intake/Output Summary (Last 24 hours) at 07/19/2023 1013 Last data filed at 07/18/2023 1332 Gross per 24 hour  Intake --  Output 600 ml  Net -600 ml   LBM:  Last BM Date : 07/13/23 Baseline Weight: Weight: 54 kg Most recent weight: Weight: 54 kg       Palliative Assessment/Data:      Patient Active Problem List   Diagnosis Date Noted   Protein-calorie malnutrition, severe 07/18/2023   Hypoxia 07/13/2023   Disorientation 07/13/2023   Wheezing 07/13/2023   Palliative care by specialist 07/13/2023   Goals of care, counseling/discussion 07/13/2023   DNR (do not resuscitate) discussion 07/13/2023   Concern about end of life 07/13/2023   DNR (do not resuscitate) 07/13/2023   DNI (do not intubate) 07/13/2023   Acute respiratory failure with hypoxia (HCC) 07/12/2023   Essential hypertension 02/11/2017   Acute on chronic systolic CHF (congestive heart failure) (HCC) 02/01/2017   HLD (hyperlipidemia) 02/01/2017   CAD (coronary artery disease)    Benign neoplasm of colon 02/23/2013   Near syncope 02/21/2013   Anemia 02/21/2013   Chronic systolic congestive heart failure (HCC) 02/21/2013   GI bleed 02/21/2013   Chronic anticoagulation 02/21/2013   Coronary artery disease with history of myocardial infarction without history of CABG 02/21/2013   Resting chest pain 10/27/2012   Hypothyroidism 10/15/2012   Atrial fibrillation (HCC) 10/15/2012   CKD (chronic kidney disease), stage III (HCC) 10/15/2012   Dilated cardiomyopathy (HCC)    Aortic insufficiency    PAC (premature atrial contraction)     Palliative Care Assessment & Plan   Patient Profile: ***  Assessment: Principal Problem:   Acute respiratory failure with hypoxia (HCC) Active Problems:    Hypothyroidism   Atrial fibrillation (HCC)   CKD (chronic kidney disease), stage III (HCC)   Anemia   Chronic systolic congestive heart failure (HCC)   CAD (coronary artery disease)   HLD (hyperlipidemia)   Essential hypertension   Hypoxia   Disorientation   Wheezing   Palliative care by specialist   Goals of care, counseling/discussion   DNR (do not resuscitate) discussion   Concern about end of life   DNR (do not resuscitate)   DNI (do not intubate)   Protein-calorie malnutrition, severe   Recommendations/Plan: ***  Goals of Care and Additional Recommendations: Limitations on Scope of Treatment: {Recommended Scope and Preferences:21019}  Code Status:    Code Status Orders  (From admission, onward)           Start     Ordered   07/12/23 1609  Do not attempt resuscitation (DNR)- Limited -Do Not Intubate (DNI)  Continuous       Question Answer Comment  If pulseless and not breathing No CPR or chest compressions.   In Pre-Arrest Conditions (Patient Is Breathing and Has A Pulse) Do not intubate. Provide all appropriate non-invasive medical interventions. Avoid ICU transfer unless indicated or required.   Consent: Discussion documented in EHR or advanced directives reviewed      07/12/23 1609           Code Status History     Date Active Date Inactive Code Status Order ID Comments User Context   07/12/2023 1521 07/12/2023 1609 Limited: Do not attempt resuscitation (DNR) -DNR-LIMITED -Do Not Intubate/DNI  [id number]  Annette Killings, NP ED   07/12/2023 1236 07/12/2023 1521 Full Code [id number]  Ninetta Basket, MD ED   03/07/2017 2102 03/09/2017 1633 Full Code [id number]  Walton Guppy, MD ED   02/01/2017 1953 02/03/2017 1814 Full Code [id number]  Fidencio Hue, MD ED   07/18/2016 0021 07/19/2016 1551 Full Code [id number]  Dea Evert, DO ED   02/21/2013  2026 02/25/2013 1732 Full Code [id number]  Pecolia Bourbon, MD ED       Prognosis:  {Palliative Care  Prognosis:23504}  Discharge Planning: {Palliative dispostion:23505}  Care plan was discussed with ***  Thank you for allowing the Palliative Medicine Team to assist in the care of this patient.   Time In: *** Time Out: *** Total Time *** Prolonged Time Billed  {YES QV:95638}       Annette Killings, NP  Please contact Palliative Medicine Team phone at [phone] for questions and concerns.   *Portions of this note are a verbal dictation therefore any spelling and/or grammatical errors are due to the "[employer]agon [employer] One" system interpretation.

## 2023-07-19 NOTE — Plan of Care (Signed)

## 2023-07-19 NOTE — Progress Notes (Signed)
 PROGRESS NOTE                                                                                                                                                                                                             Patient Demographics:    Julie Lara, is a 89 y.o. female, DOB - 06/14/1920, ZOX:[id number]  Outpatient Primary MD for the patient is Suan Elm, MD    LOS - 6  Admit date - 07/12/2023    Chief Complaint  Patient presents with   Shortness of Breath   Weakness       Brief Narrative   89 y.o. female with medical history significant of hypertension, hyperlipidemia, hypothyroidism, CKD 3, chronic systolic CHF, CAD, atrial fibrillation, anemia presenting with worsening shortness of breath.  Workup suggestive of acute hypoxic respiratory failure due to metapneumovirus pNA.   Subjective:   Had a good night sleep as discussed with family, they report last BM was few days ago asking for some laxatives.      Assessment  & Plan :    Acute respiratory failure with hypoxia due to metapneumovirus pneumonia. - Work of breathing requiring BiPAP initially, continue with as needed. - Continue to wean oxygen as tolerated, this morning she is down to 3 L oxygen. - Treated with azithromycin  due to concern of bacterial coinfection. -She was encouraged to use incentive spirometry, flutter valve, continue with hypertonic saline  - Still significant Rales bilaterally, continue with IV diuresis, give extra dose of 40 mg IV Lasix  today, and resume on home Lasix  40 mg p.o. twice daily, will keep monitoring closely given some evidence of contraction alkalosis and elevated creatinine, despite receiving Diamox  yesterday bicarb remains significantly elevated, so I have creased her Diamox  today. - Continue with IV steroids.  Start on PPI for GI prophylaxis -Discussed with family, she is high risk for hospital delirium, they  will allow for circadian rhythm, to keep her engaged and interactive daytime, will do melatonin scheduled at bedtime and as needed hydroxyzine  - of note patient is on scheduled Lasix  and prednisone  at home. - even though her oxygen requirements are less, but she does appear to be significantly dyspneic today with minimal activity even with speaking, no significant finding on chest x-ray. - Have discussed with daughter and granddaughters at bedside, her respiratory status is very tenuous,  and she is quite symptomatic/dyspneic with minimal activity, and may need some symptomatic management, so hospice would be very helpful at home, palliative medicine assistance greatly appreciated, hospice has been consulted.  Discharge home tomorrow with hospice.  SpO2: 96 % O2 Flow Rate (L/min): 3 L/min FiO2 (%): 50 %   Prolonged QTc > QTc 500 on EKG.   - Avoid QTc prolonging medications -Keep electrolytes stable, renew low-dose beta-blocker, repeat EKG 07/14/2023 with improvement in QTc   Hypertension - Continue beta-blocker as tolerated by blood pressure   Hyperlipidemia - Continue home atorvastatin    Hypothyroidism - Continue home Synthroid  stable TSH.   CKD 3 B > Creatinine slight around 1.4 - Trend renal function and electrolytes   Acute on chronic systolic CHF EF 45% to 50%. > Has some acute decompensation on 07/14/2023, IV Lasix , low-dose beta-blocker, dose Lasix  daily, get 40 mg of IV Lasix  twice daily with low-dose Aldactone  continue low-dose beta-blocker.  Renal insufficiency precludes the use of ACE/ARB/Entresto.   CAD > Status post PCI per chart - Continue home ASA, metoprolol , atorvastatin    Paroxysmal atrial fibrillation with RVR > Currently sinus rhythm. - Continue home metoprolol , as needed IV Cardizem  - Not on any anticoagulation   Thrombocytopenia > Stable at 92 - Trend CBC      Hypoglycemia > Noted to have some hypoglycemia by EMS.  Responded to D10 now  hyperglycemic. - Trend CBGs   CBG (last 3)  Recent Labs    07/18/23 1557 07/18/23 2319 07/19/23 0922  GLUCAP 157* 130* 131*         Condition - Extremely Guarded  Family Communication  : Discussed with both granddaughters at bedside  Code Status :  DNR  Consults  : Palliative care  PUD Prophylaxis : PPI   Procedures  :     CT -  Marked cardiomegaly. Coronary artery disease. 4.7 cm ascending thoracic aortic aneurysm. Recommend semi-annual imaging followup by CTA or MRA and referral to cardiothoracic surgery if not already obtained.  TTE - 1. Left ventricular ejection fraction, by estimation, is 45 to 50%. The left ventricle has mildly decreased function. The left ventricle has no regional wall motion abnormalities. There is mild concentric left ventricular hypertrophy. Left ventricular diastolic parameters are indeterminate.  2. Right ventricular systolic function is mildly reduced. The right ventricular size is severely enlarged. There is moderately elevated pulmonary artery systolic pressure. The estimated right ventricular systolic pressure is 46.4 mmHg.  3. Left atrial size was severely dilated.  4. Right atrial size was severely dilated.  5. The mitral valve is normal in structure. Mild mitral valve regurgitation. No evidence of mitral stenosis.  6. Severe, secondary regurgitation with coaptation gap of 0.7 cm. The tricuspid valve is abnormal. Tricuspid valve regurgitation is severe.  7. Severe, central, aortic regurgitation with 2D vena contracta of 0.8 cm and holodiastolic reversal in the descending aortic arch. The aortic valve is tricuspid. Aortic valve regurgitation is severe. Aortic valve sclerosis/calcification is present, without any evidence of aortic stenosis.  8. Pulmonic valve regurgitation is moderate.  9. Aortic dilatation noted. There is mild dilatation of the aortic root, measuring 41 mm. There is mild dilatation of the ascending aorta, measuring 45 mm. 10. The  inferior vena cava is dilated in size with <50% respiratory variability, suggesting right atrial pressure of 15 mmHg.      Disposition Plan  :    Status is: Inpt  DVT Prophylaxis  :    heparin  injection 5,000 Units  Start: 07/12/23 1615    Lab Results  Component Value Date   PLT 117 (L) 07/19/2023    Diet :  Diet Order             DIET DYS 3 Room service appropriate? Yes; Fluid consistency: Nectar Thick  Diet effective now                    Inpatient Medications  Scheduled Meds:  acetaZOLAMIDE   250 mg Oral TID   aspirin   81 mg Oral QPM   atorvastatin   40 mg Oral QPM   B-complex with vitamin C  1 tablet Oral Daily   vitamin B-12  1,000 mcg Oral Q lunch   furosemide   40 mg Oral Daily   guaiFENesin   5 mL Oral BID   heparin   5,000 Units Subcutaneous Q8H   ipratropium-albuterol   3 mL Nebulization TID   lactose free nutrition  237 mL Oral BID BM   levothyroxine   25 mcg Oral Q0600   melatonin  3 mg Oral QHS   methylPREDNISolone  (SOLU-MEDROL ) injection  60 mg Intravenous Q24H   metoprolol  tartrate  50 mg Oral BID   multivitamin with minerals  1 tablet Oral Q lunch   pantoprazole  (PROTONIX ) IV  40 mg Intravenous Q24H   sodium chloride  flush  3 mL Intravenous Q12H   sodium chloride  HYPERTONIC  4 mL Nebulization BID   Continuous Infusions: PRN Meds:.acetaminophen  **OR** acetaminophen , diltiazem , hydrOXYzine , ipratropium-albuterol , nitroGLYCERIN , polyethylene glycol, sodium chloride      Objective:   Vitals:   07/19/23 0224 07/19/23 0705 07/19/23 0933 07/19/23 1326  BP: 128/80  105/65   Pulse: 86  95   Resp: 19  20   Temp: 97.7 F (36.5 C)  97.6 F (36.4 C)   TempSrc: Axillary  Axillary   SpO2: 96% 95% 95% 96%  Weight:      Height:        Wt Readings from Last 3 Encounters:  07/12/23 54 kg  07/08/23 54 kg  07/31/22 56 kg    No intake or output data in the 24 hours ending 07/19/23 1400     Physical Exam   Awake Alert, Oriented X 3, hearing,  but appropriate and coherent, extremely frail, deconditioned Symmetrical Chest wall movement, wheezing has improved, but she remains with significant crackles and coarse respiratory sounds, dyspneic with minimal activity.   RRR,No Gallops,Rubs or new Murmurs, No Parasternal Heave +ve B.Sounds, Abd Soft, No tenderness, No rebound - guarding or rigidity. No Cyanosis, Clubbing or edema, No new Rash or bruise        Data Review:    Recent Labs  Lab 07/14/23 0746 07/15/23 0434 07/16/23 0427 07/17/23 0436 07/18/23 0803 07/19/23 0433  WBC 3.7* 4.0 4.2 5.0 4.9 5.5  HGB 10.7* 10.2* 10.3* 10.5* 10.8* 10.6*  HCT 34.3* 31.9* 32.0* 31.8* 32.8* 33.2*  PLT 103* 104* 104* 111* 109* 117*  MCV 95.3 95.8 94.7 92.7 94.0 94.1  MCH 29.7 30.6 30.5 30.6 30.9 30.0  MCHC 31.2 32.0 32.2 33.0 32.9 31.9  RDW 15.9* 15.6* 15.3 15.3 15.4 15.2  LYMPHSABS 0.8 0.9 0.8 1.0 1.2  --   MONOABS 0.2 0.4 0.3 0.5 0.4  --   EOSABS 0.0 0.0 0.0 0.0 0.0  --   BASOSABS 0.0 0.0 0.0 0.0 0.0  --     Recent Labs  Lab 07/12/23 1511 07/12/23 1631 07/13/23 0612 07/14/23 0746 07/15/23 0434 07/15/23 1610 07/16/23 0427 07/17/23 9604 07/17/23 1259 07/18/23 0803 07/19/23 5409  NA  --   --  143 140 139  --  141 139  --  141 139  K  --   --  4.1 4.3 4.2  --  4.3 4.1  --  4.4 4.1  CL   < >  --  99 99 100  --  99 98  --  94* 94*  CO2   < >  --  31 29 28   --  31 32  --  34* 35*  ANIONGAP   < >  --  13 12 11   --  11 9  --  13 10  GLUCOSE   < >  --  117* 99 114*  --  111* 101*  --  101* 102*  BUN   < >  --  26* 31* 37*  --  34* 38*  --  40* 45*  CREATININE   < >  --  1.26* 1.31* 1.43*  --  1.22* 1.26*  --  1.35* 1.38*  AST  --   --  36  --   --   --   --   --   --   --   --   ALT  --   --  24  --   --   --   --   --   --   --   --   ALKPHOS  --   --  81  --   --   --   --   --   --   --   --   BILITOT  --   --  0.8  --   --   --   --   --   --   --   --   ALBUMIN  --   --  3.0*  --   --   --   --   --   --   --   --   CRP   --   --  1.5*  --   --  0.7  --   --   --   --   --   PROCALCITON  --  <0.10 <0.10  --   --  <0.10  --   --   --  <0.10  --   TSH  --   --  0.509  --   --   --   --   --   --   --   --   BNP   < >  --  498.4* 476.4* 793.6*  --  621.5* 540.4*  --  391.0* 461.2*  MG   < >  --  2.1 2.3 2.2  --  2.2 2.2  --  2.3  --   PHOS  --   --   --  2.9  --   --   --   --  3.3 3.4  --   CALCIUM    < >  --  8.8* 8.9 8.8*  --  8.7* 9.0  --  9.0 8.9   < > = values in this interval not displayed.      Recent Labs  Lab 07/12/23 1631 07/13/23 0612 07/13/23 0612 07/14/23 0746 07/15/23 0434 07/15/23 0856 07/16/23 0427 07/17/23 0436 07/18/23 0803 07/19/23 0433  CRP  --  1.5*  --   --   --  0.7  --   --   --   --   PROCALCITON <0.10 <0.10  --   --   --  <  0.10  --   --  <0.10  --   TSH  --  0.509  --   --   --   --   --   --   --   --   BNP  --  498.4*   < > 476.4* 793.6*  --  621.5* 540.4* 391.0* 461.2*  MG  --  2.1   < > 2.3 2.2  --  2.2 2.2 2.3  --   CALCIUM   --  8.8*   < > 8.9 8.8*  --  8.7* 9.0 9.0 8.9   < > = values in this interval not displayed.    --------------------------------------------------------------------------------------------------------------- Lab Results  Component Value Date   CHOL 146 12/15/2012   HDL 64.10 12/15/2012   LDLCALC 71 12/15/2012   TRIG 54.0 12/15/2012   CHOLHDL 2 12/15/2012      Micro Results Recent Results (from the past 240 hours)  Respiratory (~20 pathogens) panel by PCR     Status: Abnormal   Collection Time: 07/12/23  4:09 PM   Specimen: Nasopharyngeal Swab; Respiratory  Result Value Ref Range Status   Adenovirus NOT DETECTED NOT DETECTED Final   Coronavirus 229E NOT DETECTED NOT DETECTED Final    Comment: (NOTE) The Coronavirus on the Respiratory Panel, DOES NOT test for the novel  Coronavirus (2019 nCoV)    Coronavirus HKU1 NOT DETECTED NOT DETECTED Final   Coronavirus NL63 NOT DETECTED NOT DETECTED Final   Coronavirus OC43 NOT DETECTED NOT  DETECTED Final   Metapneumovirus DETECTED (A) NOT DETECTED Final   Rhinovirus / Enterovirus NOT DETECTED NOT DETECTED Final   Influenza A NOT DETECTED NOT DETECTED Final   Influenza B NOT DETECTED NOT DETECTED Final   Parainfluenza Virus 1 NOT DETECTED NOT DETECTED Final   Parainfluenza Virus 2 NOT DETECTED NOT DETECTED Final   Parainfluenza Virus 3 NOT DETECTED NOT DETECTED Final   Parainfluenza Virus 4 NOT DETECTED NOT DETECTED Final   Respiratory Syncytial Virus NOT DETECTED NOT DETECTED Final   Bordetella pertussis NOT DETECTED NOT DETECTED Final   Bordetella Parapertussis NOT DETECTED NOT DETECTED Final   Chlamydophila pneumoniae NOT DETECTED NOT DETECTED Final   Mycoplasma pneumoniae NOT DETECTED NOT DETECTED Final    Comment: Performed at [facility], [address] [place], [place] [address]  Blood culture (routine x 2)     Status: None   Collection Time: 07/12/23  4:51 PM   Specimen: BLOOD RIGHT FOREARM  Result Value Ref Range Status   Specimen Description BLOOD RIGHT FOREARM  Final   Special Requests   Final    BOTTLES DRAWN AEROBIC AND ANAEROBIC Blood Culture results may not be optimal due to an inadequate volume of blood received in culture bottles   Culture   Final    NO GROWTH 5 DAYS Performed at [facility], [address], [place], [place] [address]    Report Status 07/17/2023 FINAL  Final  Blood culture (routine x 2)     Status: None   Collection Time: 07/12/23  8:56 PM   Specimen: BLOOD LEFT HAND  Result Value Ref Range Status   Specimen Description BLOOD LEFT HAND  Final   Special Requests   Final    BOTTLES DRAWN AEROBIC ONLY Blood Culture results may not be optimal due to an inadequate volume of blood received in culture bottles   Culture   Final    NO GROWTH 5 DAYS Performed at [facility],  [address], [place], [place] [address]    Report Status 07/17/2023 FINAL  Final    Radiology Report DG Chest Port 1 View Result Date:  07/18/2023 CLINICAL DATA:  Hypoxia EXAM: PORTABLE CHEST 1 VIEW COMPARISON:  07/16/2023 FINDINGS: Single frontal view of the chest demonstrates stable enlargement of the cardiac silhouette. No acute airspace disease, effusion, or pneumothorax. No acute bony abnormalities. IMPRESSION: 1. Stable enlarged cardiac silhouette. 2. No acute intrathoracic process. Electronically Signed   By: Bobbye Burrow M.D.   On: 07/18/2023 15:08      Signature  -   Kelleen Patee Pawel Soules M.D on 07/19/2023 at 2:00 PM   -  To page go to [email or web address]

## 2023-07-19 NOTE — Progress Notes (Addendum)
 Patient's family refused for CBG check.

## 2023-07-19 NOTE — Progress Notes (Signed)
 Occupational Therapy Treatment Patient Details Name: Julie Lara MRN: [id number] DOB: 20-Jan-1921 Today's Date: 07/19/2023   History of present illness Patient is 89 yo female admitted on 07/12/23 for acute respioratory failure with hypoxia. PMH significant for HTN, hyperlipidemia, hypothyroidism, CKD 3, chronic systolic CHF, CAD, atrial fibrillation, and anemia.   OT comments  Pt greeted with family present and agreeable to session. Pt coming to EOB with close guard for safety and use of rails and raised HOB. Pt completed STS with min assist for powering up, self care completed at sink while seated. Pt able to ambulate in room with CGA throughout. Pt limited this session by SOB and coughing, requiring continuous cues to engage in PLB. SpO2 dropping with activity but returning with seated breaks and PLB. Pt on 3L O2 for entirety of session. Acute OT to continue to follow to address established goals to facilitate DC to next venue of care.        If plan is discharge home, recommend the following:  A little help with walking and/or transfers;A little help with bathing/dressing/bathroom;Assistance with cooking/housework;Help with stairs or ramp for entrance;Assist for transportation   Equipment Recommendations  BSC/3in1    Recommendations for Other Services      Precautions / Restrictions Precautions Precautions: Fall;Other (comment) (monitor O2) Recall of Precautions/Restrictions: Intact Precaution/Restrictions Comments: Monitor HR and O2. Restrictions Weight Bearing Restrictions Per Provider Order: No       Mobility Bed Mobility Overal bed mobility: Needs Assistance Bed Mobility: Supine to Sit     Supine to sit: Contact guard, HOB elevated, Used rails     General bed mobility comments: increased time required, CGA for safety    Transfers Overall transfer level: Needs assistance Equipment used: Rolling walker (2 wheels) Transfers: Sit to/from Stand Sit to Stand: Min  assist           General transfer comment: min assist for power up, cues for hand placement on rise     Balance Overall balance assessment: Needs assistance Sitting-balance support: No upper extremity supported, Feet supported Sitting balance-Leahy Scale: Fair Sitting balance - Comments: EOB and in chair at sink   Standing balance support: Bilateral upper extremity supported, Reliant on assistive device for balance Standing balance-Leahy Scale: Poor Standing balance comment: reliant on external support for balance                           ADL either performed or assessed with clinical judgement   ADL Overall ADL's : Needs assistance/impaired     Grooming: Wash/dry face;Brushing hair;Minimal assistance;Sitting Grooming Details (indicate cue type and reason): seated at sink in chair, able to wash face but required min assist to complete hair brushing                 Toilet Transfer: Contact guard assist;Ambulation;Rolling walker (2 wheels) Toilet Transfer Details (indicate cue type and reason): simulated to chair         Functional mobility during ADLs: Contact guard assist;Rolling walker (2 wheels) General ADL Comments: limited by shortness of breath during activity but eager to participate, encouraged to engage in pursed lip breathing    Extremity/Trunk Assessment              Vision       Perception     Praxis     Communication Communication Communication: No apparent difficulties Factors Affecting Communication: Hearing impaired   Cognition Arousal: Alert Behavior During Therapy: [facility]  for tasks assessed/performed Cognition: No apparent impairments             OT - Cognition Comments: Pt slightly lethargic today but was oriented and communicated well                 Following commands: Intact        Cueing   Cueing Techniques: Verbal cues, Gestural cues  Exercises      Shoulder Instructions       General Comments  O2 dropping with activity during session, improved with seated breaks and pursed lip breathing    Pertinent Vitals/ Pain       Pain Assessment Pain Assessment: No/denies pain  Home Living                                          Prior Functioning/Environment              Frequency  Min 1X/week        Progress Toward Goals  OT Goals(current goals can now be found in the care plan section)  Progress towards OT goals: Progressing toward goals  Acute Rehab OT Goals Patient Stated Goal: non stated OT Goal Formulation: With patient Time For Goal Achievement: 07/27/23 Potential to Achieve Goals: Good ADL Goals Pt Will Perform Grooming: with supervision;standing Pt Will Perform Lower Body Dressing: with supervision;sitting/lateral leans;sit to/from stand Pt Will Transfer to Toilet: with supervision;ambulating  Plan      Co-evaluation                 AM-PAC OT 6 Clicks Daily Activity     Outcome Measure   Help from another person eating meals?: None Help from another person taking care of personal grooming?: A Little Help from another person toileting, which includes using toliet, bedpan, or urinal?: A Little Help from another person bathing (including washing, rinsing, drying)?: A Little Help from another person to put on and taking off regular upper body clothing?: A Little Help from another person to put on and taking off regular lower body clothing?: A Little 6 Click Score: 19    End of Session Equipment Utilized During Treatment: Gait belt;Rolling walker (2 wheels);Oxygen (3 liters)  OT Visit Diagnosis: Unsteadiness on feet (R26.81);Muscle weakness (generalized) (M62.81);Other (comment)   Activity Tolerance Patient tolerated treatment well   Patient Left in chair;with call bell/phone within reach;with chair alarm set;with family/visitor present   Nurse Communication Mobility status        Time: 9176-9146 OT Time Calculation  (min): 30 min  Charges: OT General Charges $OT Visit: 1 Visit OT Treatments $Self Care/Home Management : 23-37 mins  Jullie Arps, BS, OTA/S   Zaylie Gisler 07/19/2023, 1:32 PM

## 2023-07-19 NOTE — Progress Notes (Signed)
 Physical Therapy Treatment Patient Details Name: Julie Lara MRN: [id number] DOB: [birth year] Today's Date: 07/19/2023   History of Present Illness Patient is 89 yo female admitted on 07/12/23 for acute respioratory failure with hypoxia. PMH significant for HTN, hyperlipidemia, hypothyroidism, CKD 3, chronic systolic CHF, CAD, atrial fibrillation, and anemia.    PT Comments  Pt received in supine, family present, pt lethargic and with increased work of breathing but very eager to participate in activity as able. Pt performed supine UE/LE exercises with multimodal cues and good effort, needing rest breaks intermittently due to notable tachycardia and DOE 3/4 with minimal exertion. Pt and family instructed on supine HEP and activity pacing and very receptive. Defer OOB to chair as per family pt sat up ~4 hours earlier in the day and today appears severely fatigued with minimal exertion. Pt family asking for pt to get a breathing treatment, RN notified. Pt with notable jugular vein distension and phlegmatic cough. Pt continues to benefit from PT services to progress toward functional mobility goals.     If plan is discharge home, recommend the following: Assistance with cooking/housework;A little help with bathing/dressing/bathroom;A little help with walking and/or transfers   Can travel by private vehicle        Equipment Recommendations  None recommended by PT (defer to palliative care team)    Recommendations for Other Services       Precautions / Restrictions Precautions Precautions: Fall;Other (comment) (monitor O2) Recall of Precautions/Restrictions: Intact Precaution/Restrictions Comments: Monitor HR and O2. Restrictions Weight Bearing Restrictions Per Provider Order: No     Mobility  Bed Mobility Overal bed mobility: Needs Assistance Bed Mobility: Supine to Sit     Supine to sit: HOB elevated, Used rails, Min assist     General bed mobility comments: CGA to minA to  long sitting and to lean to her R side in long sitting to reposition in bed. Defer EOB/OOB due to pt tachycardia at rest and fatigue after activity with therapies earlier in the day.    Transfers Overall transfer level: Needs assistance                 General transfer comment: defer for pt safety due to increased lethargy after bed-level activity and tachycardia    Ambulation/Gait                   Stairs             Wheelchair Mobility     Tilt Bed    Modified Rankin (Stroke Patients Only)       Balance Overall balance assessment: Needs assistance Sitting-balance support: No upper extremity supported, Feet supported Sitting balance-Leahy Scale: Fair Sitting balance - Comments: tending to rely on back support due to fatigue       Standing balance comment: defer today due to lethargy/tachycardia                            Communication Communication Communication: No apparent difficulties Factors Affecting Communication: Hearing impaired  Cognition Arousal: Lethargic Behavior During Therapy: WFL for tasks assessed/performed   PT - Cognitive impairments: Attention                       PT - Cognition Comments: Cooperative, decreased insight to her deficits. Pt HoH and needs reminders to take rest breaks due to tachycardia and apparent severe fatigue with minimal activity. Pt tending to close  eyes after 10 reps of each activity but reopens them and eager to continue activity when therapist speaking with her. Following commands: Impaired Following commands impaired: Only follows one step commands consistently    Cueing Cueing Techniques: Verbal cues, Gestural cues, Tactile cues  Exercises Other Exercises Other Exercises: supine BUE A/AAROM: shoulder/elbow flex/ext x10 reps ea; shoulder flexion with small amount of weight (half filled soda bottle, ~1lb) x10 reps (tactile assist for safety due to weight and pt decreased grip  strength) Other Exercises: pulling up to long sitting and lateral leans x3 reps with bed side rail assist Other Exercises: bed chair posture BLE AROM: hip flexion, LAQ x20 reps ea    General Comments General comments (skin integrity, edema, etc.): SpO2 90% and above on 3L O2 [place], HR tachy 100-135 bpm with minimal bed-level UE and LE exercises. Improves to ~100 bpm after repositioning with pillows to better support her lower back and rest break. Notable jugular vein distension.      Pertinent Vitals/Pain Pain Assessment Pain Assessment: PAINAD Breathing: occasional labored breathing, short period of hyperventilation Negative Vocalization: none Facial Expression: smiling or inexpressive Body Language: relaxed Consolability: no need to console PAINAD Score: 1 Pain Intervention(s): Limited activity within patient's tolerance, Monitored during session, Repositioned    Home Living                          Prior Function            PT Goals (current goals can now be found in the care plan section) Acute Rehab PT Goals Patient Stated Goal: to be able to go home PT Goal Formulation: With patient Time For Goal Achievement: 07/27/23 Progress towards PT goals: Not progressing toward goals - comment (due to unstable VS and apparent fatigue)    Frequency    Min 3X/week      PT Plan      Co-evaluation              AM-PAC PT "6 Clicks" Mobility   Outcome Measure  Help needed turning from your back to your side while in a flat bed without using bedrails?: A Little Help needed moving from lying on your back to sitting on the side of a flat bed without using bedrails?: A Little Help needed moving to and from a bed to a chair (including a wheelchair)?: A Little Help needed standing up from a chair using your arms (e.g., wheelchair or bedside chair)?: A Little Help needed to walk in hospital room?: Total (unsafe to attempt due to tachycardia today) Help needed climbing  3-5 steps with a railing? : Total 6 Click Score: 14    End of Session Equipment Utilized During Treatment: Oxygen Activity Tolerance: Patient limited by fatigue;Treatment limited secondary to medical complications (Comment);Other (comment) (tachycardia with minimal bed level exercise, DOE 3/4) Patient left: in bed;with call bell/phone within reach;with bed alarm set;with family/visitor present;Other (comment) (x4 visitors in her room; pt in bed chair posture, family and pt requests all 4 rails up) Nurse Communication: Mobility status;Other (comment) (pt's family asking for her to have a breathing tx) PT Visit Diagnosis: Unsteadiness on feet (R26.81);Other abnormalities of gait and mobility (R26.89);Muscle weakness (generalized) (M62.81)     Time: 3295-1884 PT Time Calculation (min) (ACUTE ONLY): 15 min  Charges:    $Therapeutic Exercise: 8-22 mins PT General Charges $$ ACUTE PT VISIT: 1 Visit  Llana Rile., PTA Acute Rehabilitation Services Secure Chat Preferred 9a-5:30pm Office: [phone]    Mariel Shope [facility] 07/19/2023, 4:51 PM

## 2023-07-19 NOTE — Plan of Care (Signed)
  Problem: Clinical Measurements: Goal: Diagnostic test results will improve Outcome: Progressing Goal: Respiratory complications will improve Outcome: Progressing   Problem: Nutrition: Goal: Adequate nutrition will be maintained Outcome: Progressing   Problem: Coping: Goal: Level of anxiety will decrease Outcome: Progressing   

## 2023-07-20 ENCOUNTER — Other Ambulatory Visit (HOSPITAL_COMMUNITY): Payer: Self-pay

## 2023-07-20 DIAGNOSIS — Z7189 Other specified counseling: Secondary | ICD-10-CM | POA: Diagnosis not present

## 2023-07-20 DIAGNOSIS — J9601 Acute respiratory failure with hypoxia: Secondary | ICD-10-CM | POA: Diagnosis not present

## 2023-07-20 DIAGNOSIS — R410 Disorientation, unspecified: Secondary | ICD-10-CM | POA: Diagnosis not present

## 2023-07-20 DIAGNOSIS — Z515 Encounter for palliative care: Secondary | ICD-10-CM | POA: Diagnosis not present

## 2023-07-20 MED ORDER — MORPHINE SULFATE (CONCENTRATE) 10 MG /0.5 ML PO SOLN
5.0000 mg | ORAL | 0 refills | Status: DC | PRN
Start: 2023-07-20 — End: 2023-07-30

## 2023-07-20 MED ORDER — MORPHINE SULFATE 10 MG/5ML PO SOLN
2.0000 mg | Freq: Once | ORAL | Status: AC
  Administered 2023-07-20: 2 mg via ORAL
  Filled 2023-07-20: qty 2

## 2023-07-20 MED ORDER — HYDROXYZINE HCL 25 MG PO TABS
25.0000 mg | ORAL_TABLET | Freq: Three times a day (TID) | ORAL | 0 refills | Status: DC | PRN
  Filled 2023-07-20: qty 30, 10d supply, fill #0

## 2023-07-20 MED ORDER — LORAZEPAM 2 MG/ML IJ SOLN: 1.0000 mg | INTRAMUSCULAR | Status: DC | PRN

## 2023-07-20 MED ORDER — ALBUTEROL SULFATE 1.25 MG/3ML IN NEBU
1.0000 | INHALATION_SOLUTION | Freq: Four times a day (QID) | RESPIRATORY_TRACT | 0 refills | Status: DC | PRN
  Filled 2023-07-20: qty 75, 7d supply, fill #0

## 2023-07-20 MED ORDER — GLYCOPYRROLATE 1 MG PO TABS: 1.0000 mg | ORAL_TABLET | ORAL | Status: DC | PRN

## 2023-07-20 MED ORDER — HALOPERIDOL LACTATE 5 MG/ML IJ SOLN: 2.0000 mg | Freq: Four times a day (QID) | INTRAMUSCULAR | Status: DC | PRN

## 2023-07-20 MED ORDER — HYDROXYZINE HCL 25 MG PO TABS: 25.0000 mg | ORAL_TABLET | Freq: Three times a day (TID) | ORAL | 0 refills | Status: DC | PRN

## 2023-07-20 MED ORDER — BIOTENE DRY MOUTH MT LIQD: 15.0000 mL | Freq: Two times a day (BID) | OROMUCOSAL | Status: DC

## 2023-07-20 MED ORDER — SODIUM CHLORIDE 3 % IN NEBU: INHALATION_SOLUTION | RESPIRATORY_TRACT | 0 refills | Status: DC | PRN

## 2023-07-20 MED ORDER — HALOPERIDOL 1 MG PO TABS
2.0000 mg | ORAL_TABLET | Freq: Four times a day (QID) | ORAL | 0 refills | Status: DC | PRN
  Filled 2023-07-20: qty 20, 3d supply, fill #0

## 2023-07-20 MED ORDER — PREDNISONE 10 MG (21) PO TBPK
ORAL_TABLET | ORAL | 0 refills | Status: DC
Start: 2023-07-20 — End: 2023-07-30

## 2023-07-20 MED ORDER — MORPHINE SULFATE (CONCENTRATE) 10 MG /0.5 ML PO SOLN: 5.0000 mg | ORAL | Status: DC | PRN

## 2023-07-20 MED ORDER — IPRATROPIUM-ALBUTEROL 0.5-2.5 (3) MG/3ML IN SOLN
3.0000 mL | Freq: Three times a day (TID) | RESPIRATORY_TRACT | 0 refills | Status: DC
  Filled 2023-07-20: qty 360, 40d supply, fill #0

## 2023-07-20 MED ORDER — GLYCOPYRROLATE 1 MG PO TABS
1.0000 mg | ORAL_TABLET | ORAL | 0 refills | Status: DC | PRN
  Filled 2023-07-20: qty 20, 4d supply, fill #0

## 2023-07-20 MED ORDER — PREDNISONE 10 MG (21) PO TBPK: ORAL_TABLET | ORAL | 0 refills | Status: DC

## 2023-07-20 MED ORDER — HALOPERIDOL LACTATE 2 MG/ML PO CONC: 2.0000 mg | Freq: Four times a day (QID) | ORAL | Status: DC | PRN

## 2023-07-20 MED ORDER — GLYCOPYRROLATE 0.2 MG/ML IJ SOLN: 0.2000 mg | INTRAMUSCULAR | Status: DC | PRN

## 2023-07-20 MED ORDER — BOOST PLUS PO LIQD: 237.0000 mL | ORAL | Status: DC | PRN

## 2023-07-20 MED ORDER — IPRATROPIUM-ALBUTEROL 0.5-2.5 (3) MG/3ML IN SOLN: 3.0000 mL | Freq: Three times a day (TID) | RESPIRATORY_TRACT | 0 refills | Status: DC

## 2023-07-20 MED ORDER — HALOPERIDOL 1 MG PO TABS: 2.0000 mg | ORAL_TABLET | Freq: Four times a day (QID) | ORAL | 0 refills | Status: DC | PRN

## 2023-07-20 MED ORDER — MORPHINE SULFATE (CONCENTRATE) 10 MG /0.5 ML PO SOLN
5.0000 mg | ORAL | 0 refills | Status: DC | PRN
  Filled 2023-07-20: qty 15, 3d supply, fill #0

## 2023-07-20 MED ORDER — ONDANSETRON 4 MG PO TBDP
4.0000 mg | ORAL_TABLET | Freq: Four times a day (QID) | ORAL | 0 refills | Status: DC | PRN
  Filled 2023-07-20: qty 20, 5d supply, fill #0

## 2023-07-20 MED ORDER — ALBUTEROL SULFATE 1.25 MG/3ML IN NEBU: 1.0000 | INHALATION_SOLUTION | Freq: Four times a day (QID) | RESPIRATORY_TRACT | 0 refills | Status: DC | PRN

## 2023-07-20 MED ORDER — POLYVINYL ALCOHOL 1.4 % OP SOLN: 1.0000 [drp] | Freq: Four times a day (QID) | OPHTHALMIC | Status: DC | PRN

## 2023-07-20 MED ORDER — LORAZEPAM 1 MG PO TABS: 1.0000 mg | ORAL_TABLET | ORAL | Status: DC | PRN

## 2023-07-20 MED ORDER — HALOPERIDOL 1 MG PO TABS: 2.0000 mg | ORAL_TABLET | Freq: Four times a day (QID) | ORAL | Status: DC | PRN

## 2023-07-20 MED ORDER — FLEET ENEMA RE ENEM
1.0000 | ENEMA | Freq: Once | RECTAL | Status: AC
  Administered 2023-07-20: 1 via RECTAL
  Filled 2023-07-20: qty 1

## 2023-07-20 MED ORDER — SODIUM CHLORIDE 3 % IN NEBU
INHALATION_SOLUTION | RESPIRATORY_TRACT | 0 refills | Status: DC | PRN
  Filled 2023-07-20: qty 750, fill #0

## 2023-07-20 MED ORDER — ONDANSETRON HCL 4 MG/2ML IJ SOLN: 4.0000 mg | Freq: Four times a day (QID) | INTRAMUSCULAR | Status: DC | PRN

## 2023-07-20 MED ORDER — GLYCOPYRROLATE 1 MG PO TABS: 1.0000 mg | ORAL_TABLET | ORAL | 0 refills | Status: DC | PRN

## 2023-07-20 MED ORDER — LORAZEPAM 1 MG PO TABS
1.0000 mg | ORAL_TABLET | ORAL | 0 refills | Status: DC | PRN
  Filled 2023-07-20: qty 20, 4d supply, fill #0

## 2023-07-20 MED ORDER — HYDROXYZINE HCL 25 MG PO TABS: 25.0000 mg | ORAL_TABLET | Freq: Three times a day (TID) | ORAL | Status: DC | PRN

## 2023-07-20 MED ORDER — LORAZEPAM 2 MG/ML PO CONC: 1.0000 mg | ORAL | Status: DC | PRN

## 2023-07-20 MED ORDER — ONDANSETRON 4 MG PO TBDP: 4.0000 mg | ORAL_TABLET | Freq: Four times a day (QID) | ORAL | 0 refills | Status: DC | PRN

## 2023-07-20 MED ORDER — ONDANSETRON 4 MG PO TBDP: 4.0000 mg | ORAL_TABLET | Freq: Four times a day (QID) | ORAL | Status: DC | PRN

## 2023-07-20 MED ORDER — LORAZEPAM 1 MG PO TABS: 1.0000 mg | ORAL_TABLET | ORAL | 0 refills | Status: DC | PRN

## 2023-07-20 MED ORDER — DIPHENHYDRAMINE HCL 50 MG/ML IJ SOLN: 25.0000 mg | INTRAMUSCULAR | Status: DC | PRN

## 2023-07-20 NOTE — Discharge Instructions (Signed)
 Management per hospice, feeding is for comfort,

## 2023-07-20 NOTE — Progress Notes (Signed)
 Daily Progress Note   Patient Name: Julie Lara       Date: 07/20/2023 DOB: [birth year]  Age: 89 y.o. MRN#: [id number] Attending Physician: Epifanio Haste, MD Primary Care Physician: Suan Elm, MD Admit Date: 07/12/2023  Reason for Consultation/Follow-up: Establishing goals of care  Subjective: Received notification from Dr. Osborne Blazer that patient has shown decline and struggling to breath, family now unsure if returning home is best.  I have reviewed medical records including EPIC notes, MAR, any available advanced directives as necessary, and labs.   Went to visit patient at bedside - great-granddaughter and her husband at bedside. Patient is lying in bed awake, alert, and able to participate in simple conversation. No signs or non-verbal gestures of pain or discomfort noted. She is intermittently tachypneic.  Emotional support provided to family. Therapeutic listening provided as they reflect on "not being sure she would make it" overnight.   Met privately in hallway to speak freely about hospice. Great-granddaughter expresses concern about patient returning home. We discussed options of hospice facility vs anticipated hospital death on full comfort measures. She tells me that patient's other family/decision makers will be returning to bedside soon. I will plan to return around 11:45 to meet family for ongoing GOC.  11:45 AM Met family and moved to 67M conference room for privacy. Met with 4 family members, including daughter/[name], granddaughter/[name], granddaughter/[name], and daughter/[name].   Emotional support provided to patient's family. Therapeutic listening provided as they reflect on patient's "rough night." Family realize she is reaching end of life and are most  interested in focusing on her comfort at this point.  We talked about transition to comfort measures in house and what that would entail inclusive of medications to control pain, dyspnea, agitation, nausea, and itching. We discussed stopping all unnecessary measures such as blood draws, needle sticks, oxygen, antibiotics, CBGs/insulin, cardiac monitoring, IVF, and frequent vital signs. Education provided that other non-pharmacological interventions would be utilized for holistic support and comfort such as spiritual support if requested, repositioning, music therapy, offering comfort feeds, and/or therapeutic listening. All care would focus on how the patient is looking and feeling.   Family express concerns regarding utilization of comfort medications in context of sedation and causing patient to "starve." Anticipated natural trajectory at EOL reviewed as well as discussions dealing with the complex and  emotionally intense issues of symptom management. Concepts of comfort feeds/careful hand feeding discussed and education provided. Family express understanding and appreciation of discussion - knowing patient "will not be starving." Family are understandably tearful, but opt for her transition to full comfort measures today.  Reviewed discharge options of home with hospice vs hospice facility vs anticipated hospital death. After discussion, family continue to feel comfortable caring for patient in the home setting with hospice + private duty caregiver support. Family tell me, "home is where she wants to be." They have already set up private caregiving in addition to hospice. Family confirm DME has already been delivered and hospice staff have an appointment with them today at 5p. Family are hopeful patient can get home prior to this appointment.  Generalized discussion regarding patient's current medications in context of transition to full comfort and hospice care was had per family request.  All  questions and concerns addressed. Encouraged to call with questions and/or concerns. PMT card previously provided.  Length of Stay: 7  Current Medications: Scheduled Meds:   acetaZOLAMIDE   250 mg Oral TID   aspirin   81 mg Oral QPM   atorvastatin   40 mg Oral QPM   B-complex with vitamin C  1 tablet Oral Daily   vitamin B-12  1,000 mcg Oral Q lunch   furosemide   40 mg Oral Daily   guaiFENesin   5 mL Oral BID   heparin   5,000 Units Subcutaneous Q8H   ipratropium-albuterol   3 mL Nebulization TID   lactose free nutrition  237 mL Oral BID BM   levothyroxine   25 mcg Oral Q0600   melatonin  3 mg Oral QHS   methylPREDNISolone  (SOLU-MEDROL ) injection  60 mg Intravenous Q24H   metoprolol  tartrate  50 mg Oral BID   multivitamin with minerals  1 tablet Oral Q lunch   pantoprazole  (PROTONIX ) IV  40 mg Intravenous Q24H   sodium chloride  flush  3 mL Intravenous Q12H   sodium phosphate  1 enema Rectal Once    Continuous Infusions:   PRN Meds: acetaminophen  **OR** acetaminophen , diltiazem , hydrOXYzine , ipratropium-albuterol , nitroGLYCERIN , polyethylene glycol, sodium chloride   Physical Exam Vitals and nursing note reviewed.  Constitutional:      General: She is not in acute distress.    Appearance: She is ill-appearing.  Pulmonary:     Effort: No respiratory distress.  Skin:    General: Skin is warm and dry.  Neurological:     Motor: Weakness present.             Vital Signs: BP 116/83 (BP Location: Left Arm)   Pulse (!) 104   Temp 98 F (36.7 C) (Axillary)   Resp (!) 32   Ht 5\' 4"  (1.626 m)   Wt 54 kg   SpO2 99%   BMI 20.43 kg/m  SpO2: SpO2: 99 % O2 Device: O2 Device: Nasal Cannula O2 Flow Rate: O2 Flow Rate (L/min): 4 L/min  Intake/output summary:  Intake/Output Summary (Last 24 hours) at 07/20/2023 1001 Last data filed at 07/19/2023 2203 Gross per 24 hour  Intake 403 ml  Output --  Net 403 ml   LBM: Last BM Date : 07/13/23 Baseline Weight: Weight: 54 kg Most recent  weight: Weight: 54 kg       Palliative Assessment/Data: PPS 30%      Patient Active Problem List   Diagnosis Date Noted   Protein-calorie malnutrition, severe 07/18/2023   Hypoxia 07/13/2023   Disorientation 07/13/2023   Wheezing 07/13/2023   Palliative care  by specialist 07/13/2023   Goals of care, counseling/discussion 07/13/2023   DNR (do not resuscitate) discussion 07/13/2023   Concern about end of life 07/13/2023   DNR (do not resuscitate) 07/13/2023   DNI (do not intubate) 07/13/2023   Acute respiratory failure with hypoxia (HCC) 07/12/2023   Essential hypertension 02/11/2017   Acute on chronic systolic CHF (congestive heart failure) (HCC) 02/01/2017   HLD (hyperlipidemia) 02/01/2017   CAD (coronary artery disease)    Benign neoplasm of colon 02/23/2013   Near syncope 02/21/2013   Anemia 02/21/2013   Chronic systolic congestive heart failure (HCC) 02/21/2013   GI bleed 02/21/2013   Chronic anticoagulation 02/21/2013   Coronary artery disease with history of myocardial infarction without history of CABG 02/21/2013   Resting chest pain 10/27/2012   Hypothyroidism 10/15/2012   Atrial fibrillation (HCC) 10/15/2012   CKD (chronic kidney disease), stage III (HCC) 10/15/2012   Dilated cardiomyopathy (HCC)    Aortic insufficiency    PAC (premature atrial contraction)     Palliative Care Assessment & Plan   Patient Profile: 89 y.o. female with past medical history of hypertension, hyperlipidemia, hypothyroidism, CKD 3, chronic systolic CHF, CAD, atrial fibrillation, anemia presented to the ED on 07/12/2023 with complaints of shortness of breath. She was recently seen in ED on 07/03/23 for same - work up was unremarkable and she was sent home.   Assessment: Principal Problem:   Acute respiratory failure with hypoxia (HCC) Active Problems:   Hypothyroidism   Atrial fibrillation (HCC)   CKD (chronic kidney disease), stage III (HCC)   Anemia   Chronic systolic congestive  heart failure (HCC)   CAD (coronary artery disease)   HLD (hyperlipidemia)   Essential hypertension   Hypoxia   Disorientation   Wheezing   Palliative care by specialist   Goals of care, counseling/discussion   DNR (do not resuscitate) discussion   Concern about end of life   DNR (do not resuscitate)   DNI (do not intubate)   Protein-calorie malnutrition, severe   Terminal care  Recommendations/Plan: Initiated full comfort measures Continue DNR/DNI as previously documented Discharge home with hospice today Added orders for EOL symptom management and to reflect full comfort measures, as well as discontinued orders that were not focused on comfort Unrestricted visitation orders were placed per current [place] EOL visitation policy  Nursing to provide frequent assessments and administer PRN medications as clinically necessary to ensure EOL comfort PMT will continue to follow and support holistically  Symptom Management Morphine  concentrate PRN pain/dyspnea/increased work of breathing/RR>25 Tylenol  PRN pain/fever Biotin twice daily Benadryl  PRN itching Robinul  PRN secretions Haldol  PRN agitation/delirium Ativan  PRN anxiety/seizure/sleep/distress Zofran  PRN nausea/vomiting Liquifilm Tears PRN dry eye Hydroxyzine  PRN anxiety Nitroglycerin  PRN chest pain Continue cardizem , aspirin , lasix , guaifenesin , synthroid , melatonin, lopressor  while tolerating POs Continue albuterol  and nasal spray PRN   Goals of Care and Additional Recommendations: Limitations on Scope of Treatment: Full Comfort Care  Code Status:    Code Status Orders  (From admission, onward)           Start     Ordered   07/12/23 1609  Do not attempt resuscitation (DNR)- Limited -Do Not Intubate (DNI)  Continuous       Question Answer Comment  If pulseless and not breathing No CPR or chest compressions.   In Pre-Arrest Conditions (Patient Is Breathing and Has A Pulse) Do not intubate. Provide all  appropriate non-invasive medical interventions. Avoid ICU transfer unless indicated or required.   Consent:  Discussion documented in EHR or advanced directives reviewed      07/12/23 1609           Code Status History     Date Active Date Inactive Code Status Order ID Comments User Context   07/12/2023 1521 07/12/2023 1609 Limited: Do not attempt resuscitation (DNR) -DNR-LIMITED -Do Not Intubate/DNI  [id number]  Annette Killings, NP ED   07/12/2023 1236 07/12/2023 1521 Full Code [id number]  Ninetta Basket, MD ED   03/07/2017 2102 03/09/2017 1633 Full Code [id number]  Walton Guppy, MD ED   02/01/2017 1953 02/03/2017 1814 Full Code [id number]  Fidencio Hue, MD ED   07/18/2016 0021 07/19/2016 1551 Full Code [id number]  Dea Evert, DO ED   02/21/2013 2026 02/25/2013 1732 Full Code [id number]  Pecolia Bourbon, MD ED       Prognosis:  < 2 weeks  Discharge Planning: Home with Hospice  Care plan was discussed with primary RN, patient's family, TOC, hospice liaison, Dr. Osborne Blazer  Thank you for allowing the Palliative Medicine Team to assist in the care of this patient.   Total Time 80 minutes Prolonged Time Billed  yes       Annette Killings, NP  Please contact Palliative Medicine Team phone at [phone] for questions and concerns.   *Portions of this note are a verbal dictation therefore any spelling and/or grammatical errors are due to the "[employer]" system interpretation.

## 2023-07-20 NOTE — TOC Transition Note (Signed)
 Transition of Care [facility]) - Discharge Note   Patient Details  Name: LASHAUN POCH MRN: [id number] Date of Birth: 1920/11/06  Transition of Care [facility]) CM/SW Contact:  Omie Bickers, RN Phone Number: 07/20/2023, 2:15 PM   Clinical Narrative:     Spoke w family at bedside.  Patient has all DME delivered to home, including O2. Verified address. Requesting PTAR.  ACC, nurse, and MD aware PTAR called. Forms to unit printer, DNR confirmed on chart.   Final next level of care: Home w Hospice Care Barriers to Discharge: No Barriers Identified   Patient Goals and CMS Choice Patient states their goals for this hospitalization and ongoing recovery are:: home hospice CMS Medicare.gov Compare Post Acute Care list provided to:: Other (Comment Required) Choice offered to / list presented to : Adult Children      Discharge Placement                       Discharge Plan and Services Additional resources added to the After Visit Summary for                              [facility] Agency: [facility] Date [facility] Agency Contacted: 07/20/23   Representative spoke with at [facility] Agency: Alexandria Angel  Social Drivers of Health (SDOH) Interventions SDOH Screenings   Food Insecurity: No Food Insecurity (07/12/2023)  Housing: Low Risk  (07/12/2023)  Transportation Needs: No Transportation Needs (07/12/2023)  Utilities: Not At Risk (07/12/2023)  Social Connections: Socially Isolated (07/12/2023)  Tobacco Use: Low Risk  (07/12/2023)     Readmission Risk Interventions     No data to display

## 2023-07-20 NOTE — Discharge Summary (Signed)
 Physician Discharge Summary  Julie Lara MVH:[id number] DOB: 1921/03/01 DOA: 07/12/2023  PCP: Suan Elm, MD  Admit date: 07/12/2023 Discharge date: 07/20/2023  Admitted From: (Home) Disposition:  (Home with  Hospice)  Recommendations for Outpatient Follow-up:  Management per hospice, goal is comfort   Brief/Interim Summary:  89 y.o. female with medical history significant of hypertension, hyperlipidemia, hypothyroidism, CKD 3, chronic systolic CHF, CAD, atrial fibrillation, anemia presenting with worsening shortness of breath.  Workup suggestive of acute hypoxic respiratory failure due to metapneumovirus pNA.  Initially requiring BiPAP, then heated high flow, overall her oxygen requirement has much improved, which she is currently on 3 L nasal cannula, but unfortunately despite improved oxygen requirement she remains significantly dyspneic, with increased work of breathing, unable to clear her secretions despite being on high-dose steroids, antibiotic treatment, aggressive chest PT, scheduled DuoNebs and hypertonic saline, palliative medicine were consulted, and family decided for hospice at time of discharge, as well overnight patient with significantly increased work of breathing, so she has been transitioned to full comfort measures with plan to discharge home, she will be provided with meds regarding comfort including morphine , Ativan , haloperidol .      Acute respiratory failure with hypoxia due to metapneumovirus pneumonia. Prolonged QTc Hypertension Hyperlipidemia Hypothyroidism CKD 3 B Acute on chronic systolic CHF EF 45% to 50%. CAD Paroxysmal atrial fibrillation with RVR Thrombocytopenia Hypoglycemia     Discharge Diagnoses:  Principal Problem:   Acute respiratory failure with hypoxia (HCC) Active Problems:   Hypothyroidism   Atrial fibrillation (HCC)   CKD (chronic kidney disease), stage III (HCC)   Anemia   Chronic systolic congestive heart failure  (HCC)   CAD (coronary artery disease)   HLD (hyperlipidemia)   Essential hypertension   Hypoxia   Disorientation   Wheezing   Palliative care by specialist   Goals of care, counseling/discussion   DNR (do not resuscitate) discussion   Concern about end of life   DNR (do not resuscitate)   DNI (do not intubate)   Protein-calorie malnutrition, severe    Discharge Instructions  Discharge Instructions     Diet - low sodium heart healthy   Complete by: As directed    Increase activity slowly   Complete by: As directed       Allergies as of 07/20/2023   No Known Allergies      Medication List     STOP taking these medications    atorvastatin  40 MG tablet Commonly known as: LIPITOR   b complex vitamins capsule   cefdinir 300 MG capsule Commonly known as: OMNICEF   IRON  PO   predniSONE  10 MG tablet Commonly known as: DELTASONE  Replaced by: predniSONE  10 MG (21) Tbpk tablet   VITAMIN B-12 PO   Womens Multivitamin Gummies Chew       TAKE these medications    albuterol  108 (90 Base) MCG/ACT inhaler Commonly known as: VENTOLIN  HFA Inhale 2 puffs into the lungs every 6 (six) hours as needed for wheezing or shortness of breath. What changed: Another medication with the same name was added. Make sure you understand how and when to take each.   albuterol  1.25 MG/3ML nebulizer solution Commonly known as: ACCUNEB  Take 3 mLs (1.25 mg total) by nebulization every 6 (six) hours as needed for wheezing. What changed: You were already taking a medication with the same name, and this prescription was added. Make sure you understand how and when to take each.   aspirin  EC 81 MG tablet Take 81  mg by mouth every evening.   furosemide  40 MG tablet Commonly known as: LASIX  TAKE 1 TABLET BY MOUTH TWICE A DAY What changed: when to take this   glycopyrrolate  1 MG tablet Commonly known as: ROBINUL  Take 1 tablet (1 mg total) by mouth every 4 (four) hours as needed  (excessive secretions).   haloperidol  2 MG tablet Commonly known as: HALDOL  Take 1 tablet (2 mg total) by mouth every 6 (six) hours as needed for agitation (or delirium).   hydrOXYzine  25 MG tablet Commonly known as: ATARAX  Take 1 tablet (25 mg total) by mouth 3 (three) times daily as needed for anxiety.   ipratropium-albuterol  0.5-2.5 (3) MG/3ML Soln Commonly known as: DUONEB Take 3 mLs by nebulization 3 (three) times daily.   lactose free nutrition Liqd Take 237 mLs by mouth 3 (three) times daily as needed (low appetite). Chocolate flavor preferred.   levothyroxine  25 MCG tablet Commonly known as: SYNTHROID  Take 25 mcg by mouth daily.   LORazepam  1 MG tablet Commonly known as: ATIVAN  Take 1 tablet (1 mg total) by mouth every 4 (four) hours as needed for anxiety, seizure or sleep (distress).   metoprolol  tartrate 25 MG tablet Commonly known as: LOPRESSOR  TAKE 1 TABLET BY MOUTH TWICE A DAY   morphine  CONCENTRATE 10 mg / 0.5 ml concentrated solution Take 0.25-0.5 mLs (5-10 mg total) by mouth every 2 (two) hours as needed for severe pain (pain score 7-10) or shortness of breath.   nitroGLYCERIN  0.4 MG SL tablet Commonly known as: Nitrostat  Place 1 tablet (0.4 mg total) under the tongue every 5 (five) minutes as needed for chest pain.   ondansetron  4 MG disintegrating tablet Commonly known as: ZOFRAN -ODT Take 1 tablet (4 mg total) by mouth every 6 (six) hours as needed for nausea.   pantoprazole  40 MG tablet Commonly known as: PROTONIX  Take 1 tablet (40 mg total) by mouth daily at 6 (six) AM. What changed: when to take this   predniSONE  10 MG (21) Tbpk tablet Commonly known as: STERAPRED UNI-PAK 21 TAB Use per package instructions Replaces: predniSONE  10 MG tablet   sodium chloride  HYPERTONIC 3 % nebulizer solution Take by nebulization as needed for other.        No Known Allergies  Consultations: Palliative care   Procedures/Studies: DG Swallowing  Func-Speech Pathology Result Date: 07/20/2023 CLINICAL DATA:  Dysphagia. Cough/GE reflux disease/other secondary diagnosis EXAM: MODIFIED BARIUM SWALLOW TECHNIQUE: Different consistencies of barium were administered orally to the patient by the Speech Pathologist. Imaging of the pharynx was performed in the lateral projection. Radiologist, not in attendance for the exam. Different consistencies of barium were administered orally to the patient by the Speech Pathologist. Imaging of the pharynx was performed in the lateral projection. The radiologist was present in the fluoroscopy room for this study, providing personal supervision. FLUOROSCOPY TIME:  Radiation Exposure Index (as provided by the fluoroscopic device): 4 minutes 48 seconds 19.74 mGy COMPARISON:  None Available. FINDINGS: Modified barium swallow was performed by the speech pathologist. Radiologist was not involved with this exam. Please refer to the Speech Pathology report for results and recommendations. IMPRESSION: Please refer to the Speech Pathologists report for complete details and recommendations. Electronically Signed   By: Bettylou Brunner M.D.   On: 07/20/2023 09:55   DG Chest Port 1 View Result Date: 07/18/2023 CLINICAL DATA:  Hypoxia EXAM: PORTABLE CHEST 1 VIEW COMPARISON:  07/16/2023 FINDINGS: Single frontal view of the chest demonstrates stable enlargement of the cardiac silhouette. No acute  airspace disease, effusion, or pneumothorax. No acute bony abnormalities. IMPRESSION: 1. Stable enlarged cardiac silhouette. 2. No acute intrathoracic process. Electronically Signed   By: Bobbye Burrow M.D.   On: 07/18/2023 15:08   DG Chest Port 1 View Result Date: 07/16/2023 CLINICAL DATA:  [id number] with shortness of breath. EXAM: PORTABLE CHEST 1 VIEW COMPARISON:  Portable chest yesterday at 6:00 a.m. FINDINGS: 6:40 a.m. Left retrocardiac consolidation centered laterally continues to be seen. Lungs are otherwise clear. The heart is moderately  enlarged. No vascular congestion is seen. The mediastinum is stable with aortic tortuosity calcification. There is no substantial pleural effusion. There is osteopenia with dextroscoliosis and degenerative change of the spine, chronic rotator cuff arthropathy. IMPRESSION: 1. Left retrocardiac consolidation centered laterally continues to be seen. Overall aeration seems unchanged. 2. Cardiomegaly without evidence of vascular congestion. 3. Aortic atherosclerosis. Electronically Signed   By: Denman Fischer M.D.   On: 07/16/2023 06:58   DG Chest Port 1 View Result Date: 07/15/2023 CLINICAL DATA:  Shortness of breath. EXAM: PORTABLE CHEST 1 VIEW COMPARISON:  07/13/2023 FINDINGS: The cardio pericardial silhouette is enlarged. There is pulmonary vascular congestion without overt pulmonary edema. Retrocardiac left base collapse/consolidation is similar to prior. Bones are diffusely demineralized. Telemetry leads overlie the chest. IMPRESSION: 1. Enlargement of the cardiopericardial silhouette with pulmonary vascular congestion. 2. Retrocardiac left base collapse/consolidation, similar to prior. Electronically Signed   By: Donnal Fusi M.D.   On: 07/15/2023 06:20   ECHOCARDIOGRAM COMPLETE Result Date: 07/13/2023    ECHOCARDIOGRAM REPORT   Patient Name:   ALYISSA WHIDBEE Date of Exam: 07/13/2023 Medical Rec #:  [id number]       Height:       64.0 in Accession #:    [id number]      Weight:       119.0 lb Date of Birth:  10-03-1920       BSA:          1.569 m Patient Age:    89 years       BP:           121/80 mmHg Patient Gender: F               HR:           91 bpm. Exam Location:  Inpatient Procedure: 2D Echo, Color Doppler and Cardiac Doppler (Both Spectral and Color            Flow Doppler were utilized during procedure). Indications:    Acute Respiratory Distress R06.03  History:        Patient has prior history of Echocardiogram examinations, most                 recent 02/02/2017. CAD, Arrythmias:Atrial  Fibrillation; Risk                 Factors:Hypertension and Dyslipidemia.  Sonographer:    Sherline Distel Senior RDCS Referring Phys: [id number] Gayl Katos MELVIN IMPRESSIONS  1. Left ventricular ejection fraction, by estimation, is 45 to 50%. The left ventricle has mildly decreased function. The left ventricle has no regional wall motion abnormalities. There is mild concentric left ventricular hypertrophy. Left ventricular diastolic parameters are indeterminate.  2. Right ventricular systolic function is mildly reduced. The right ventricular size is severely enlarged. There is moderately elevated pulmonary artery systolic pressure. The estimated right ventricular systolic pressure is 46.4 mmHg.  3. Left atrial size was severely dilated.  4. Right atrial size was severely dilated.  5. The mitral valve is normal in structure. Mild mitral valve regurgitation. No evidence of mitral stenosis.  6. Severe, secondary regurgitation with coaptation gap of 0.7 cm. The tricuspid valve is abnormal. Tricuspid valve regurgitation is severe.  7. Severe, central, aortic regurgitation with 2D vena contracta of 0.8 cm and holodiastolic reversal in the descending aortic arch. The aortic valve is tricuspid. Aortic valve regurgitation is severe. Aortic valve sclerosis/calcification is present, without any evidence of aortic stenosis.  8. Pulmonic valve regurgitation is moderate.  9. Aortic dilatation noted. There is mild dilatation of the aortic root, measuring 41 mm. There is mild dilatation of the ascending aorta, measuring 45 mm. 10. The inferior vena cava is dilated in size with <50% respiratory variability, suggesting right atrial pressure of 15 mmHg. Comparison(s): Prior images reviewed side by side. Further progression of her aortic regurgitation from imaging done before age 64. FINDINGS  Left Ventricle: Left ventricular ejection fraction, by estimation, is 45 to 50%. The left ventricle has mildly decreased function. The left ventricle has  no regional wall motion abnormalities. Strain was performed and the global longitudinal strain is indeterminate. The left ventricular internal cavity size was normal in size. There is mild concentric left ventricular hypertrophy. Left ventricular diastolic function could not be evaluated due to atrial fibrillation. Left ventricular diastolic parameters are indeterminate. Right Ventricle: The right ventricular size is severely enlarged. No increase in right ventricular wall thickness. Right ventricular systolic function is mildly reduced. There is moderately elevated pulmonary artery systolic pressure. The tricuspid regurgitant velocity is 2.80 m/s, and with an assumed right atrial pressure of 15 mmHg, the estimated right ventricular systolic pressure is 46.4 mmHg. Left Atrium: Left atrial size was severely dilated. Right Atrium: Right atrial size was severely dilated. Pericardium: There is no evidence of pericardial effusion. Mitral Valve: The mitral valve is normal in structure. Mild mitral valve regurgitation. No evidence of mitral valve stenosis. Tricuspid Valve: Severe, secondary regurgitation with coaptation gap of 0.7 cm. The tricuspid valve is abnormal. Tricuspid valve regurgitation is severe. No evidence of tricuspid stenosis. Aortic Valve: Severe, central, aortic regurgitation with 2D vena contracta of 0.8 cm and holodiastolic reversal in the descending aortic arch. The aortic valve is tricuspid. There is mild aortic valve annular calcification. Aortic valve regurgitation is severe. Aortic valve sclerosis/calcification is present, without any evidence of aortic stenosis. Pulmonic Valve: The pulmonic valve was normal in structure. Pulmonic valve regurgitation is moderate. No evidence of pulmonic stenosis. Aorta: Aortic dilatation noted. There is mild dilatation of the aortic root, measuring 41 mm. There is mild dilatation of the ascending aorta, measuring 45 mm. Venous: The inferior vena cava is dilated in  size with less than 50% respiratory variability, suggesting right atrial pressure of 15 mmHg. IAS/Shunts: No atrial level shunt detected by color flow Doppler. Additional Comments: 3D was performed not requiring image post processing on an independent workstation and was indeterminate.  LEFT VENTRICLE PLAX 2D LVIDd:         4.70 cm LVIDs:         3.20 cm LV PW:         1.20 cm LV IVS:        1.10 cm LVOT diam:     2.10 cm LV SV:         47 LV SV Index:   30 LVOT Area:     3.46 cm  RIGHT VENTRICLE RV S prime:     10.20 cm/s TAPSE (M-mode): 1.4 cm LEFT ATRIUM  Index         RIGHT ATRIUM           Index LA diam:        5.10 cm  3.25 cm/m    RA Area:     39.50 cm LA Vol (A2C):   162.0 ml 103.23 ml/m  RA Volume:   152.00 ml 96.86 ml/m LA Vol (A4C):   141.0 ml 89.85 ml/m LA Biplane Vol: 159.0 ml 101.32 ml/m  AORTIC VALVE LVOT Vmax:         73.90 cm/s LVOT Vmean:        53.700 cm/s LVOT VTI:          0.137 m AR Vena Contracta: 0.80 cm  AORTA Ao Root diam: 4.10 cm Ao Asc diam:  4.50 cm TRICUSPID VALVE TR Peak grad:   31.4 mmHg TR Vmax:        280.00 cm/s  SHUNTS Systemic VTI:  0.14 m Systemic Diam: 2.10 cm Gloriann Larger MD Electronically signed by Gloriann Larger MD Signature Date/Time: 07/13/2023/3:11:01 PM    Final    DG Chest Port 1 View Result Date: 07/13/2023 CLINICAL DATA:  Shortness of breath. EXAM: PORTABLE CHEST 1 VIEW COMPARISON:  07/12/2023 FINDINGS: The cardio pericardial silhouette is enlarged. There is pulmonary vascular congestion without overt pulmonary edema. Retrocardiac opacity is compatible with atelectasis seen on CT imaging performed 4 hours prior. No evidence for substantial pleural effusion. Bones are diffusely demineralized. Telemetry leads overlie the chest. IMPRESSION: 1. Enlargement of the cardiopericardial silhouette with pulmonary vascular congestion. 2. Retrocardiac opacity compatible with atelectasis seen on CT imaging performed 4 hours prior. Electronically  Signed   By: Donnal Fusi M.D.   On: 07/13/2023 06:30   CT CHEST WO CONTRAST Result Date: 07/13/2023 CLINICAL DATA:  Respiratory illness, nondiagnostic xray. Acute respiratory failure with hypoxia. Shortness of breath. EXAM: CT CHEST WITHOUT CONTRAST TECHNIQUE: Multidetector CT imaging of the chest was performed following the standard protocol without IV contrast. RADIATION DOSE REDUCTION: This exam was performed according to the departmental dose-optimization program which includes automated exposure control, adjustment of the mA and/or kV according to patient size and/or use of iterative reconstruction technique. COMPARISON:  Chest x-ray 07/12/2023. FINDINGS: Cardiovascular: Marked cardiomegaly. Coronary artery and aortic atherosclerosis. Aneurysmal dilatation of the ascending thoracic aorta measuring up to 4.7 cm. Mediastinum/Nodes: No mediastinal, hilar, or axillary adenopathy. Trachea and esophagus are unremarkable. Thyroid  unremarkable. Lungs/Pleura: Areas of atelectasis in the lungs bilaterally adjacent to the markedly enlarged heart. No pleural effusions or confluent airspace opacities. Mild peribronchial thickening. Upper Abdomen: No acute findings Musculoskeletal: Chest wall soft tissues are unremarkable. No acute bony abnormality. IMPRESSION: Marked cardiomegaly. Coronary artery disease. 4.7 cm ascending thoracic aortic aneurysm. Recommend semi-annual imaging followup by CTA or MRA and referral to cardiothoracic surgery if not already obtained. This recommendation follows 2010 ACCF/AHA/AATS/ACR/ASA/SCA/SCAI/SIR/STS/SVM Guidelines for the Diagnosis and Management of Patients With Thoracic Aortic Disease. Circulation. 2010; 121: Z610-R604. Aortic aneurysm NOS (ICD10-I71.9) Aortic Atherosclerosis (ICD10-I70.0). Electronically Signed   By: Janeece Mechanic M.D.   On: 07/13/2023 02:27   DG Chest Port 1 View Result Date: 07/12/2023 CLINICAL DATA:  Shortness of breath EXAM: PORTABLE CHEST 1 VIEW COMPARISON:   Chest radiograph dated 07/08/2023 FINDINGS: Normal lung volumes. No focal consolidations. No pleural effusion or pneumothorax. Similar markedly enlarged cardiomediastinal silhouette. No acute osseous abnormality. IMPRESSION: 1. Similar marked cardiomegaly. 2. No focal consolidations. Electronically Signed   By: Limin  Xu M.D.   On: 07/12/2023 13:48  DG Chest Portable 1 View Result Date: 07/08/2023 CLINICAL DATA:  Shortness of breath per epic notes EXAM: PORTABLE CHEST 1 VIEW COMPARISON:  03/17/2017 FINDINGS: The heart is enlarged. No acute airspace disease, pleural effusion or overt pulmonary edema. Aortic atherosclerosis. No pneumothorax IMPRESSION: Cardiomegaly. Electronically Signed   By: Esmeralda Hedge M.D.   On: 07/08/2023 22:53   (Echo, Carotid, EGD, Colonoscopy, ERCP)    Subjective: Family at bedside, they report patient had fecal test sleeping overnight, with decline and struggling to breathe Discharge Exam: Vitals:   07/20/23 1200 07/20/23 1313  BP: 121/78   Pulse: 93   Resp: (!) 24   Temp:    SpO2: 97% 99%   Vitals:   07/20/23 1100 07/20/23 1115 07/20/23 1200 07/20/23 1313  BP:   121/78   Pulse: 100 96 93   Resp: (!) 22 (!) 26 (!) 24   Temp:      TempSrc:      SpO2: 97% 96% 97% 99%  Weight:      Height:        General: Pt is alert, awake, extremely frail, dyspneic and tachypneic Cardiovascular: RRR, S1/S2 +, no rubs, no gallops Respiratory: Coarse respiratory sounds bilaterally, with tachypnea Abdominal: Soft, NT, ND, bowel sounds + Extremities: no edema, no cyanosis    The results of significant diagnostics from this hospitalization (including imaging, microbiology, ancillary and laboratory) are listed below for reference.     Microbiology: Recent Results (from the past 240 hours)  Respiratory (~20 pathogens) panel by PCR     Status: Abnormal   Collection Time: 07/12/23  4:09 PM   Specimen: Nasopharyngeal Swab; Respiratory  Result Value Ref Range Status    Adenovirus NOT DETECTED NOT DETECTED Final   Coronavirus 229E NOT DETECTED NOT DETECTED Final    Comment: (NOTE) The Coronavirus on the Respiratory Panel, DOES NOT test for the novel  Coronavirus (2019 nCoV)    Coronavirus HKU1 NOT DETECTED NOT DETECTED Final   Coronavirus NL63 NOT DETECTED NOT DETECTED Final   Coronavirus OC43 NOT DETECTED NOT DETECTED Final   Metapneumovirus DETECTED (A) NOT DETECTED Final   Rhinovirus / Enterovirus NOT DETECTED NOT DETECTED Final   Influenza A NOT DETECTED NOT DETECTED Final   Influenza B NOT DETECTED NOT DETECTED Final   Parainfluenza Virus 1 NOT DETECTED NOT DETECTED Final   Parainfluenza Virus 2 NOT DETECTED NOT DETECTED Final   Parainfluenza Virus 3 NOT DETECTED NOT DETECTED Final   Parainfluenza Virus 4 NOT DETECTED NOT DETECTED Final   Respiratory Syncytial Virus NOT DETECTED NOT DETECTED Final   Bordetella pertussis NOT DETECTED NOT DETECTED Final   Bordetella Parapertussis NOT DETECTED NOT DETECTED Final   Chlamydophila pneumoniae NOT DETECTED NOT DETECTED Final   Mycoplasma pneumoniae NOT DETECTED NOT DETECTED Final    Comment: Performed at [facility], [address] [place], [place] [address]  Blood culture (routine x 2)     Status: None   Collection Time: 07/12/23  4:51 PM   Specimen: BLOOD RIGHT FOREARM  Result Value Ref Range Status   Specimen Description BLOOD RIGHT FOREARM  Final   Special Requests   Final    BOTTLES DRAWN AEROBIC AND ANAEROBIC Blood Culture results may not be optimal due to an inadequate volume of blood received in culture bottles   Culture   Final    NO GROWTH 5 DAYS Performed at [facility], [address] [place], [place] [address]    Report Status  07/17/2023 FINAL  Final  Blood culture (routine x 2)     Status: None   Collection Time: 07/12/23  8:56 PM   Specimen: BLOOD LEFT HAND  Result Value Ref Range Status   Specimen Description BLOOD LEFT HAND  Final   Special Requests   Final     BOTTLES DRAWN AEROBIC ONLY Blood Culture results may not be optimal due to an inadequate volume of blood received in culture bottles   Culture   Final    NO GROWTH 5 DAYS Performed at [facility], [address] [place], [place] [address]    Report Status 07/17/2023 FINAL  Final     Labs: BNP (last 3 results) Recent Labs    07/17/23 0436 07/18/23 0803 07/19/23 0433  BNP 540.4* 391.0* 461.2*   Basic Metabolic Panel: Recent Labs  Lab 07/14/23 0746 07/15/23 0434 07/16/23 0427 07/17/23 0436 07/17/23 1259 07/18/23 0803 07/19/23 0433  NA 140 139 141 139  --  141 139  K 4.3 4.2 4.3 4.1  --  4.4 4.1  CL 99 100 99 98  --  94* 94*  CO2 29 28 31  32  --  34* 35*  GLUCOSE 99 114* 111* 101*  --  101* 102*  BUN 31* 37* 34* 38*  --  40* 45*  CREATININE 1.31* 1.43* 1.22* 1.26*  --  1.35* 1.38*  CALCIUM  8.9 8.8* 8.7* 9.0  --  9.0 8.9  MG 2.3 2.2 2.2 2.2  --  2.3  --   PHOS 2.9  --   --   --  3.3 3.4  --    Liver Function Tests: No results for input(s): "AST", "ALT", "ALKPHOS", "BILITOT", "PROT", "ALBUMIN" in the last 168 hours. No results for input(s): "LIPASE", "AMYLASE" in the last 168 hours. No results for input(s): "AMMONIA" in the last 168 hours. CBC: Recent Labs  Lab 07/14/23 0746 07/15/23 0434 07/16/23 0427 07/17/23 0436 07/18/23 0803 07/19/23 0433  WBC 3.7* 4.0 4.2 5.0 4.9 5.5  NEUTROABS 2.7 2.6 3.0 3.4 3.1  --   HGB 10.7* 10.2* 10.3* 10.5* 10.8* 10.6*  HCT 34.3* 31.9* 32.0* 31.8* 32.8* 33.2*  MCV 95.3 95.8 94.7 92.7 94.0 94.1  PLT 103* 104* 104* 111* 109* 117*   Cardiac Enzymes: No results for input(s): "CKTOTAL", "CKMB", "CKMBINDEX", "TROPONINI" in the last 168 hours. BNP: Invalid input(s): "POCBNP" CBG: Recent Labs  Lab 07/17/23 2332 07/18/23 0833 07/18/23 1557 07/18/23 2319 07/19/23 0922  GLUCAP 129* 107* 157* 130* 131*   D-Dimer No results for input(s): "DDIMER" in the last 72 hours. Hgb A1c No results for input(s): "HGBA1C" in the last 72  hours. Lipid Profile No results for input(s): "CHOL", "HDL", "LDLCALC", "TRIG", "CHOLHDL", "LDLDIRECT" in the last 72 hours. Thyroid  function studies No results for input(s): "TSH", "T4TOTAL", "T3FREE", "THYROIDAB" in the last 72 hours.  Invalid input(s): "FREET3" Anemia work up No results for input(s): "VITAMINB12", "FOLATE", "FERRITIN", "TIBC", "IRON ", "RETICCTPCT" in the last 72 hours. Urinalysis    Component Value Date/Time   COLORURINE YELLOW 10/14/2012 2010   APPEARANCEUR HAZY (A) 10/14/2012 2010   LABSPEC 1.017 10/14/2012 2010   PHURINE 5.5 10/14/2012 2010   GLUCOSEU NEGATIVE 10/14/2012 2010   HGBUR TRACE (A) 10/14/2012 2010   BILIRUBINUR NEGATIVE 10/14/2012 2010   KETONESUR NEGATIVE 10/14/2012 2010   PROTEINUR NEGATIVE 10/14/2012 2010   UROBILINOGEN 0.2 10/14/2012 2010   NITRITE POSITIVE (A) 10/14/2012 2010   LEUKOCYTESUR MODERATE (A) 10/14/2012 2010   Sepsis Labs Recent Labs  Lab 07/16/23 0427 07/17/23 0436 07/18/23 0803 07/19/23 0433  WBC 4.2 5.0 4.9 5.5   Microbiology Recent Results (from the past 240 hours)  Respiratory (~20 pathogens) panel by PCR     Status: Abnormal   Collection Time: 07/12/23  4:09 PM   Specimen: Nasopharyngeal Swab; Respiratory  Result Value Ref Range Status   Adenovirus NOT DETECTED NOT DETECTED Final   Coronavirus 229E NOT DETECTED NOT DETECTED Final    Comment: (NOTE) The Coronavirus on the Respiratory Panel, DOES NOT test for the novel  Coronavirus (2019 nCoV)    Coronavirus HKU1 NOT DETECTED NOT DETECTED Final   Coronavirus NL63 NOT DETECTED NOT DETECTED Final   Coronavirus OC43 NOT DETECTED NOT DETECTED Final   Metapneumovirus DETECTED (A) NOT DETECTED Final   Rhinovirus / Enterovirus NOT DETECTED NOT DETECTED Final   Influenza A NOT DETECTED NOT DETECTED Final   Influenza B NOT DETECTED NOT DETECTED Final   Parainfluenza Virus 1 NOT DETECTED NOT DETECTED Final   Parainfluenza Virus 2 NOT DETECTED NOT DETECTED Final    Parainfluenza Virus 3 NOT DETECTED NOT DETECTED Final   Parainfluenza Virus 4 NOT DETECTED NOT DETECTED Final   Respiratory Syncytial Virus NOT DETECTED NOT DETECTED Final   Bordetella pertussis NOT DETECTED NOT DETECTED Final   Bordetella Parapertussis NOT DETECTED NOT DETECTED Final   Chlamydophila pneumoniae NOT DETECTED NOT DETECTED Final   Mycoplasma pneumoniae NOT DETECTED NOT DETECTED Final    Comment: Performed at [facility], [address], [place], [place] [address]  Blood culture (routine x 2)     Status: None   Collection Time: 07/12/23  4:51 PM   Specimen: BLOOD RIGHT FOREARM  Result Value Ref Range Status   Specimen Description BLOOD RIGHT FOREARM  Final   Special Requests   Final    BOTTLES DRAWN AEROBIC AND ANAEROBIC Blood Culture results may not be optimal due to an inadequate volume of blood received in culture bottles   Culture   Final    NO GROWTH 5 DAYS Performed at [facility], [address], [place], [place] [address]    Report Status 07/17/2023 FINAL  Final  Blood culture (routine x 2)     Status: None   Collection Time: 07/12/23  8:56 PM   Specimen: BLOOD LEFT HAND  Result Value Ref Range Status   Specimen Description BLOOD LEFT HAND  Final   Special Requests   Final    BOTTLES DRAWN AEROBIC ONLY Blood Culture results may not be optimal due to an inadequate volume of blood received in culture bottles   Culture   Final    NO GROWTH 5 DAYS Performed at [facility], [address], [place], [place] [address]    Report Status 07/17/2023 FINAL  Final     Time coordinating discharge: Over 30 minutes  SIGNED:   Seena Dadds, MD  [facility] 07/20/2023, 1:23 PM Pager   If 7PM-7AM, please contact night-coverage [email or web address]

## 2023-07-22 DIAGNOSIS — I4891 Unspecified atrial fibrillation: Secondary | ICD-10-CM | POA: Diagnosis not present

## 2023-07-22 DIAGNOSIS — E039 Hypothyroidism, unspecified: Secondary | ICD-10-CM | POA: Diagnosis not present

## 2023-07-22 DIAGNOSIS — D649 Anemia, unspecified: Secondary | ICD-10-CM | POA: Diagnosis not present

## 2023-07-22 DIAGNOSIS — N1832 Chronic kidney disease, stage 3b: Secondary | ICD-10-CM | POA: Diagnosis not present

## 2023-07-22 DIAGNOSIS — I5084 End stage heart failure: Secondary | ICD-10-CM | POA: Diagnosis not present

## 2023-07-22 DIAGNOSIS — I130 Hypertensive heart and chronic kidney disease with heart failure and stage 1 through stage 4 chronic kidney disease, or unspecified chronic kidney disease: Secondary | ICD-10-CM | POA: Diagnosis not present

## 2023-07-22 DIAGNOSIS — Z9981 Dependence on supplemental oxygen: Secondary | ICD-10-CM | POA: Diagnosis not present

## 2023-07-22 DIAGNOSIS — E785 Hyperlipidemia, unspecified: Secondary | ICD-10-CM | POA: Diagnosis not present

## 2023-07-22 DIAGNOSIS — I2510 Atherosclerotic heart disease of native coronary artery without angina pectoris: Secondary | ICD-10-CM | POA: Diagnosis not present

## 2023-07-22 DIAGNOSIS — J123 Human metapneumovirus pneumonia: Secondary | ICD-10-CM | POA: Diagnosis not present

## 2023-07-22 NOTE — Progress Notes (Signed)
 [facility] Liaison Note  07/22/2023  Julie Lara 08/[birth year]22 [id number]  Location: RN Hospital Liaison screened the patient remotely at [facility].  Insurance: Medicare   Julie Lara is a 89 y.o. female who is a Primary Care Patient of Suan Elm, MD [facility].  The patient was screened for readmission hospitalization with noted high risk score for unplanned readmission risk with 1 IP/1 ED in 6 months.  The patient was assessed for potential Care Management service needs for post hospital transition for care coordination. Review of patient's electronic medical record reveals patient was admitted with Acute respiratory failure. Pt transition home with hospice. No VBCI needs at this time.   VBCI Care Management/Population Health does not replace or interfere with any arrangements made by the Inpatient Transition of Care team.   For questions contact:   Lilla Reichert, RN, BSN Hospital Liaison [place]   [facility], Population Health Office Hours MTWF  8:00 am-6:00 pm Direct Dial: [phone] mobile [email or web address]

## 2023-07-29 ENCOUNTER — Telehealth: Payer: Self-pay | Admitting: Cardiology

## 2023-07-29 NOTE — Telephone Encounter (Signed)
 Hi Dr [place]  Daughter wanted to let you know her mom passed away on 2023/08/08.

## 2023-08-01 DEATH — deceased

## 2023-08-05 ENCOUNTER — Ambulatory Visit: Payer: Medicare Other | Admitting: Cardiology

## 2023-09-13 ENCOUNTER — Other Ambulatory Visit: Payer: Self-pay | Admitting: Cardiology
